# Patient Record
Sex: Male | Born: 1935
Health system: Southern US, Community
[De-identification: ages and names within clinical notes are randomized; demographics above are authoritative.]

## PROBLEM LIST (undated history)

## (undated) DIAGNOSIS — J449 Chronic obstructive pulmonary disease, unspecified: Secondary | ICD-10-CM

## (undated) DIAGNOSIS — B029 Zoster without complications: Secondary | ICD-10-CM

## (undated) DIAGNOSIS — J189 Pneumonia, unspecified organism: Secondary | ICD-10-CM

## (undated) DIAGNOSIS — D352 Benign neoplasm of pituitary gland: Secondary | ICD-10-CM

## (undated) DIAGNOSIS — M1990 Unspecified osteoarthritis, unspecified site: Secondary | ICD-10-CM

## (undated) HISTORY — DX: Zoster without complications: B02.9

## (undated) HISTORY — DX: Unspecified osteoarthritis, unspecified site: M19.90

## (undated) HISTORY — PX: TRANSPHENOIDAL / TRANSNASAL HYPOPHYSECTOMY / RESECTION PITUITARY TUMOR: SUR1382

---

## 1997-08-03 ENCOUNTER — Ambulatory Visit (HOSPITAL_COMMUNITY): Admission: RE | Admit: 1997-08-03 | Discharge: 1997-08-03 | Payer: Self-pay | Admitting: Internal Medicine

## 2000-10-10 ENCOUNTER — Ambulatory Visit (HOSPITAL_BASED_OUTPATIENT_CLINIC_OR_DEPARTMENT_OTHER): Admission: RE | Admit: 2000-10-10 | Discharge: 2000-10-10 | Payer: Self-pay | Admitting: Dentistry

## 2003-04-21 ENCOUNTER — Ambulatory Visit (HOSPITAL_COMMUNITY): Admission: RE | Admit: 2003-04-21 | Discharge: 2003-04-21 | Payer: Self-pay | Admitting: Internal Medicine

## 2006-08-24 ENCOUNTER — Inpatient Hospital Stay (HOSPITAL_COMMUNITY): Admission: EM | Admit: 2006-08-24 | Discharge: 2006-08-28 | Payer: Self-pay | Admitting: Emergency Medicine

## 2006-08-24 ENCOUNTER — Ambulatory Visit: Payer: Self-pay | Admitting: Internal Medicine

## 2006-08-25 ENCOUNTER — Encounter (INDEPENDENT_AMBULATORY_CARE_PROVIDER_SITE_OTHER): Payer: Self-pay | Admitting: Internal Medicine

## 2006-08-28 ENCOUNTER — Ambulatory Visit: Payer: Self-pay | Admitting: Internal Medicine

## 2006-09-18 ENCOUNTER — Encounter (INDEPENDENT_AMBULATORY_CARE_PROVIDER_SITE_OTHER): Payer: Self-pay | Admitting: Surgery

## 2006-09-18 ENCOUNTER — Ambulatory Visit (HOSPITAL_COMMUNITY): Admission: RE | Admit: 2006-09-18 | Discharge: 2006-09-19 | Payer: Self-pay | Admitting: Surgery

## 2007-02-11 ENCOUNTER — Ambulatory Visit (HOSPITAL_BASED_OUTPATIENT_CLINIC_OR_DEPARTMENT_OTHER): Admission: RE | Admit: 2007-02-11 | Discharge: 2007-02-12 | Payer: Self-pay | Admitting: Orthopedic Surgery

## 2007-12-29 ENCOUNTER — Ambulatory Visit (HOSPITAL_COMMUNITY): Admission: RE | Admit: 2007-12-29 | Discharge: 2007-12-29 | Payer: Self-pay | Admitting: Urology

## 2008-01-19 ENCOUNTER — Inpatient Hospital Stay (HOSPITAL_COMMUNITY): Admission: RE | Admit: 2008-01-19 | Discharge: 2008-01-21 | Payer: Self-pay | Admitting: Urology

## 2008-01-19 ENCOUNTER — Encounter (INDEPENDENT_AMBULATORY_CARE_PROVIDER_SITE_OTHER): Payer: Self-pay | Admitting: Urology

## 2008-03-05 HISTORY — PX: CHOLECYSTECTOMY OPEN: SUR202

## 2008-07-26 ENCOUNTER — Ambulatory Visit (HOSPITAL_COMMUNITY): Admission: RE | Admit: 2008-07-26 | Discharge: 2008-07-26 | Payer: Self-pay | Admitting: Urology

## 2008-07-28 ENCOUNTER — Ambulatory Visit (HOSPITAL_COMMUNITY): Admission: RE | Admit: 2008-07-28 | Discharge: 2008-07-28 | Payer: Self-pay | Admitting: Urology

## 2009-01-17 ENCOUNTER — Ambulatory Visit (HOSPITAL_COMMUNITY): Admission: RE | Admit: 2009-01-17 | Discharge: 2009-01-17 | Payer: Self-pay | Admitting: Urology

## 2009-03-05 HISTORY — PX: KIDNEY SURGERY: SHX687

## 2009-07-19 ENCOUNTER — Ambulatory Visit (HOSPITAL_COMMUNITY): Admission: RE | Admit: 2009-07-19 | Discharge: 2009-07-19 | Payer: Self-pay | Admitting: Urology

## 2010-02-06 ENCOUNTER — Ambulatory Visit (HOSPITAL_COMMUNITY)
Admission: RE | Admit: 2010-02-06 | Discharge: 2010-02-06 | Payer: Self-pay | Source: Home / Self Care | Admitting: Urology

## 2010-03-05 HISTORY — PX: RENAL BIOPSY: SHX156

## 2010-03-21 ENCOUNTER — Encounter
Admission: RE | Admit: 2010-03-21 | Discharge: 2010-03-21 | Payer: Self-pay | Source: Home / Self Care | Attending: Surgery | Admitting: Surgery

## 2010-03-21 ENCOUNTER — Other Ambulatory Visit
Admission: RE | Admit: 2010-03-21 | Discharge: 2010-03-21 | Payer: Self-pay | Source: Home / Self Care | Admitting: Interventional Radiology

## 2010-03-22 ENCOUNTER — Other Ambulatory Visit: Payer: Self-pay | Admitting: Interventional Radiology

## 2010-03-25 ENCOUNTER — Other Ambulatory Visit: Payer: Self-pay | Admitting: Surgery

## 2010-03-25 DIAGNOSIS — E041 Nontoxic single thyroid nodule: Secondary | ICD-10-CM

## 2010-07-18 NOTE — Op Note (Signed)
NAME:  Ryan Coffey, Ryan Coffey               ACCOUNT NO.:  [id number]   MEDICAL RECORD NO.:  [id number]          PATIENT TYPE:  OIB   LOCATION:  1340                         FACILITY:  [facility]   PHYSICIAN:  Thornton Park. Daphine Deutscher, MD  DATE OF BIRTH:  1932-12-09   DATE OF PROCEDURE:  09/18/2006  DATE OF DISCHARGE:                               OPERATIVE REPORT   PREOPERATIVE DIAGNOSIS:  History of biliary pancreatitis with gallstone  sludge.   POSTOPERATIVE DIAGNOSIS:  History of biliary pancreatitis with gallstone  sludge.   PROCEDURE:  Laparoscopic cholecystectomy with intraoperative  cholangiogram.   SURGEON:  Luretha Murphy, M.D.   ASSISTANT:  Adolph Pollack, M.D.   ANESTHESIA:  General endotracheal.   SPECIMEN:  Gallbladder.   DESCRIPTION OF PROCEDURE:  [name] is a 77 year old white male who  was taken to room 1 on Wednesday afternoon, September 18, 2006 and given  general anesthesia.  The abdomen was prepped with technique and draped  sterilely.  A longitudinal incision was made down into the umbilicus and  the Hasson cannula was inserted without difficulty.  Abdomen is  insufflated.  Three trocars were placed in the upper abdomen.  The  gallbladder was grasped, elevated and retracted.  Calot's triangle was  dissected and a critical view obtained.  The cystic duct was clipped,  incised and a Reddick catheter inserted and a dynamic cholangiogram was  shot and showed to me a slightly dilated common bile duct that was read  per radiology as normal.  Free flow into the duodenum.  No sludge or  stones were seen.  The cystic duct was then triple clipped divided.  Cystic artery was triple clipped divided, and the gallbladder was  removed the gallbladder bed with hook electrocautery without difficulty.  It was detached and placed in a bag and brought out through the  umbilicus.   The umbilical defect was repaired with under laparoscopic vision with  two sutures of 0-0 Vicryl simply.  The  gallbladder bed was inspected.  No bleeding was noted, no bile leaks were seen.  The lower portion of  the abdomen was examined.  No evidence of hernias and bowel appeared to  be otherwise normal.  He did have some diverticulosis that we could see  in the sigmoid region.  It was pretty much a normal finding for  gentleman this age.  Abdomen was then deflated and the port sites were closed with 4-0 Vicryl  after being injected with Marcaine.  The patient tolerated procedure  well was taken to recovery room in satisfactory condition.  He will be  kept for overnight observation.      Thornton Park Daphine Deutscher, MD  Electronically Signed     MBM/MEDQ  D:  09/18/2006  T:  09/19/2006  Job:  [id number]   cc:   Larina Earthly, M.D.  Fax: [phone]

## 2010-07-18 NOTE — Consult Note (Signed)
Ryan Coffey, Ryan Coffey               ACCOUNT NO.:  [id number]   MEDICAL RECORD NO.:  [id number]          PATIENT TYPE:  INP   LOCATION:  4710                         FACILITY:  [facility]   PHYSICIAN:  Bevelyn Buckles. Bensimhon, MDDATE OF BIRTH:  [birth year]   DATE OF CONSULTATION:  08/25/2006  DATE OF DISCHARGE:                                 CONSULTATION   REFERRING PHYSICIAN:  Tera Mater. Evlyn Kanner, M.D.   PRIMARY CARE PHYSICIAN:  Larina Earthly, M.D.   CARDIOLOGIST:  None.   REASON FOR CONSULTATION:  High-grade heart block with asymptomatic 5 to  6 second pauses.   HISTORY OF PRESENT ILLNESS:  Mr. 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  He denies any history of known coronary artery disease.  According to the chart, he does have a history of peripheral vascular  disease.  He has never had a stress test or catheterization to his  knowledge.  He was admitted yesterday with a gallstone pancreatitis.  Overnight he had multiple episodes of retching.  During these episodes  on telemetry, he had two 5 to 6 second pauses due to complete heart  block.  These were asymptomatic.  Last pause was about 2 a.m.  Early  this morning, around 4 a.m., he had some brief episodes of second degree  heart block.  His telemetry has been normal since that time.  He feels  more comfortable.   At baseline he is very active.  He swims and does other activities  without any chest pain or shortness of breath.  He has never had syncope  or presyncope.  Denies any heart failure.  At baseline his EKG shows a  narrow QRS.   REVIEW OF SYSTEMS:  Notable for abdominal pain, nausea, vomiting, and  fevers.  The remainder of the review of systems is negative except for  HPI and problem list.   PROBLEM LIST:  1. History of pituitary tumor, status post resection in 1988 with  resulting panhypopituitarism including hypothyroidism.  2. Rheumatoid arthritis.  3. Hyperlipidemia.  4. History of tobacco use, quit one year ago.   MEDICATIONS IN THE HOSPITAL:  1. Protonix 40 mg IV.  2. Solu-Cortef 100 mg q.8h.  3. Synthroid 88 mcg.  4. Lovenox 40 mg subcu.  5. Unasyn 1.5 g q.8h.   ALLERGIES:  No known drug allergies.   SOCIAL HISTORY:  Lives in [place] with his wife.  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  History of tobacco use of one pack per  day x20 years.  Quit a year ago.  No significant alcohol intake.   FAMILY HISTORY:  Mother died in her 36s.  No history of CAD.  Father  died at 43 due to emphysema.  He has three brothers with no history of  premature coronary artery disease.   PHYSICAL EXAMINATION:  GENERAL:  He is fairly well-appearing.  In no  acute distress, lying flat in bed.  VITAL SIGNS:  Blood pressure 107/68, heart rate  85, temperature 98.8.  HEENT:  Normal.  In particular, sclerae anicteric.  NECK:  Supple.  JVP is about 6 or 7 cm of water.  Carotids are 2+  bilaterally without any bruits.  There is no lymphadenopathy or  thyromegaly.  CARDIAC:  He has a regular rate and rhythm with an S4.  No murmurs.  LUNGS:  Clear.  ABDOMEN:  Soft, mildly tender in the epigastrium with no rebound or  guarding.  Hypoactive bowel sounds.  EXTREMITIES:  Warm with no cyanosis, clubbing or edema.  DP  pulses are  2+ bilaterally.  There is no rash.  NEUROLOGIC:  Alert and oriented x3.  Cranial nerves II-XII intact. Moves  all four extremities without difficulty.   EKG shows normal sinus rhythm with minimal T wave flattening and a rate  of 82.  P-R interval is 184 Msec.  QRS is narrow at 76 Msec.   LABORATORY DATA:  White count 28.7, hemoglobin 13.8, platelets 384, 000.  Sodium 140, potassium 5, BUN 22, creatinine 1.16, amylase 1478, lipase  985.   ASSESSMENT/PLAN:  1. Transient complete heart block in the setting of high vagal tone       and nausea and vomiting due to severe gallstone pancreatitis.  He      has not had a significant episode of heart block since 2 a.m.  At      this point I would prefer to watch him and if at all possible, will      transfer him to step-down with Atropine and Zoll's at the bedside.      Will check a TSH, 2-D echo and his electrolytes.  If pauses become      more frequent, he will need a transvenous pacer.  We will have a      low threshold to perform this.  We will follow him closely.      Bevelyn Buckles. Bensimhon, MD  Electronically Signed     DRB/MEDQ  D:  08/25/2006  T:  08/25/2006  Job:  [id number]

## 2010-07-18 NOTE — Consult Note (Signed)
NAMEOSMIN, WELZ               ACCOUNT NO.:  [id number]   MEDICAL RECORD NO.:  [id number]          PATIENT TYPE:  INP   LOCATION:  4710                         FACILITY:  [facility]   PHYSICIAN:  Adolph Pollack, M.D.DATE OF BIRTH:  Aug 03, 1932   DATE OF CONSULTATION:  DATE OF DISCHARGE:                                 CONSULTATION   REASON FOR CONSULTATION:  Biliary pancreatitis.   HISTORY OF PRESENT ILLNESS:  Mr. Coate is a 77 year old male with some  mild abdominal pain yesterday afternoon, was able to go to sleep but  awoke at five with a little bit of epigastric discomfort.  He than later  this morning had abrupt severe increasing of the pain in the epigastric  region with nausea, no radiation, had some chills, no fever and  presented to the emergency room where he was evaluated and found to have  findings consistent with acute pancreatitis.  Ultrasound was remarkable  for some gallbladder wall thickening with sludge and a common bile duct  diameter toward the upper limits of normal.  He is better with respect  to the pain since IV pain medication.   PAST MEDICAL HISTORY:  1. Pituitary tumor  2. Rheumatoid arthritis  3. Hyperlipidemia.  4. Bleeding peptic ulcer secondary to nonsteroidal anti-inflammatory      agents.   CURRENT OPERATIONS:  Transsphenoidal resection of pituitary tumor   ALLERGIES:  None.   MEDICATIONS:  Methotrexate, Pravachol, bromocriptine, hydrocortisone,  folic acid, fish oil, vitamins.   SOCIAL HISTORY:  He is married.  He is a former smoker.  No alcohol use.   FAMILY HISTORY:  Is noncontributory.   REVIEW OF SYSTEMS:  CARDIAC:  He denies hypertension or heart disease.  PULMONARY:  He denies asthma, pneumonia, COPD.  GI:  He denies  hepatitis, diverticulitis.  GU:  Denies kidney stones.  ENDOCRINE denies  diabetes.  HEMATOLOGIC thinks he may have had a transfusion with his  pituitary tumor resection, denied blood clots.  NEUROLOGIC:  No  strokes  or seizures.   PHYSICAL EXAM:  GENERALLY:  Well-developed, well-nourished male appears  to be mildly ill.  Temperature is 97.2, blood pressure 105/68, pulse 79.  EYES:  Extraocular motion is intact. He appears to have some mild  icterus.  NECK is supple without masses.  RESPIRATORY:  Breath sounds equal and clear, respirations unlabored.  CARDIOVASCULAR:  Regular rate, regular rhythm.  No murmur.  ABDOMEN:  Soft with some mild epigastric tenderness.  No masses  palpable.  Few bowel sounds heard.  No organomegaly.  MUSCULOSKELETAL:  No edema.  SKIN:  Slightly jaundiced.  NEUROLOGIC:  Alert, oriented, answers questions appropriately.   LABORATORY DATA:  Notable for lipase of 2517.  AST 249, ALT 188, total  bilirubin 4.5, alk phos 71.  White count 17,700.  CT pending.   IMPRESSION:  Acute pancreatitis, most likely appears to be biliary in  origin.  Also may have a possibility of cholecystitis as well.  Question  whether he has an impacted common bile duct stone or sludge.   PLAN:  Agree with IV fluids and IV  antibiotics and hydration.  If his  liver functions continue to rise he may need an ERCP and also pending  the CT results.  Eventually would benefit from laparoscopic, or possibly  open cholecystectomy.  I did discuss this procedure and risks with him.  Risks including but are not limited to  bleeding, infection, wound  healing problems, anesthesia, axial damage to the common  bile/liver/intestine, bile leak, and post cholecystectomy diarrhea.  He  seen understand and agree with the plan of action.      Adolph Pollack, M.D.  Electronically Signed     TJR/MEDQ  D:  08/24/2006  T:  08/25/2006  Job:  [id number]

## 2010-07-18 NOTE — Op Note (Signed)
Ryan Coffey, Ryan Coffey               ACCOUNT NO.:  [id number]   MEDICAL RECORD NO.:  [id number]          PATIENT TYPE:  INP   LOCATION:  1426                         FACILITY:  [facility]   PHYSICIAN:  Heloise Purpura, MD      DATE OF BIRTH:  13-Jul-1932   DATE OF PROCEDURE:  01/19/2008  DATE OF DISCHARGE:                               OPERATIVE REPORT   PREOPERATIVE DIAGNOSIS:  Left renal mass.   POSTOPERATIVE DIAGNOSIS:  Left renal mass.   PROCEDURE:  Left robotic laparoscopic partial nephrectomy.   ASSISTANT:  Delia Chimes, nurse practitioner.   ANESTHESIA:  General.   COMPLICATIONS:  None.   ESTIMATED BLOOD LOSS:  75 mL.   FLUIDS:  Intravenous fluids 2150 mL of lactated Ringer's.   SPECIMENS:  1. Left renal mass.  2. Left renal mass margin.   INTRAOPERATIVE FINDINGS:  Renal mass margin was negative for tumor.   Warm ischemia time 20 minutes.   DISPOSITION:  Specimens to pathology.   DRAINS:  1. #15 Blake perinephric drain.  2. 16-French Foley catheter.   INDICATION:  Mr. Jafri is a 77 year old gentleman who was found to have  an incidentally detected enhancing left renal mass measuring 1.7 cm.  This mass was felt to be concerning for renal malignancy.  He underwent  metastatic evaluation which was negative and was counseled regarding  management options.  He elected to pursue surgical resection and a  minimally invasive nephron sparing approach.  Potential risks,  complications, alternative treatment options associated with the above  procedures were discussed in detail and informed consent was obtained.   DESCRIPTION OF PROCEDURE:  The patient was taken to the operating room  and a general anesthetic was administered.  He was given preoperative  antibiotics, placed in the left modified flank position, and prepped and  draped in the usual sterile fashion.  He had been administered  preoperative stress dose steroids. Next, a preoperative time-out was  performed.  A  site was then selected lateral to the umbilicus on the  left side of the abdomen for placement of the camera port.  This was  placed using a standard open Hassan technique which allowed entry into  the peritoneal cavity under direct vision without difficulty.  A 12-mm  port was then placed and  a pneumoperitoneum established.  The 0-degree  lens was used to inspect the abdomen and there was no evidence for any  intra-abdominal injuries or other abnormalities.  The remaining ports  were then placed.  Then 8-mm  robotic ports were placed in the left  upper quadrant, left lower quadrant and far left lower quadrant.  A 12-  mm  port was placed in the midline just superior to the umbilicus.  All  ports were placed under direct vision and without difficulty.  The  surgical cart was then docked.   With the aid of the cautery scissors, the white line of Toldt was  incised along the length of the descending colon allowing the colon to  be mobilized medially in the space between the mesocolon and the  anterior layer of  Gerota's fascia to be developed.  Once the colon was  reflected adequately, the ureter and gonadal vein were identified and  these structures were lifted anteriorly off the psoas muscle.  Dissection then proceeded superiorly toward the main renal hilum.  The  patient was noted to have a large lumbar renal vein which was carefully  avoided.  The renal vein was identified and isolated from the  surrounding periadventitial tissue.  Just posterior to the renal vein  was a solitary renal artery that was identified and isolated.  This was  consistent with the patient's preoperative imaging.   Attention then returned to the kidney itself.  The lateral and  posterior,  as well as the superior attachments were carefully taken  down with a combination of cautery and sharp dissection.  Once the  kidney was freely mobile, Gerota's fascia was entered and the renal  capsule identified.  The  renal capsule was then cleared of the overlying  perinephric fat and the patient's concerning renal mass lesion was  easily identified.  It did appear to be a solid mass consistent with the  patient's preoperative imaging.  Then 12.5 grams of intravenous mannitol  was administered.  Mobilization of the kidney continued allowing the  renal mass to be easily isolated in preparation for resection.  Once  mannitol had been in approximately 10-15 minutes, the renal artery was  clamped with a laparoscopic bulldog clamp.  The mass was then resected  with cold sharp scissor dissection.  Additional frozen section margins  from the base of the resection site were sent for intraoperative  analysis and returned negative for tumor.   Attention then turned to reconstruction of the renal defect.  A piece of  Surgicel was placed into the defect as a bolster and 2-0 Monocryl  sutures were then placed through the renal capsule and used to secure  the bolster in place with a combination of Hem-o-lok than Lapra ties  to  provide renal compression.  Prior to compressing the bolster down,  FloSeal was placed into the defect.  The clamp was then removed.  Total  warm ischemia was 20 minutes.  The defect was then reinspected and there  was no evidence of any bleeding.  Additional FloSeal was placed over the  defect and the kidney was placed back into its normal position.  An  additional 12.5 grams of intravenous mannitol was administered.   The overlying perinephric fat was then reapproximated over the renal  defect and a #15 Blake drain was brought through the far left lower  quadrant port site and appropriately positioned in the perinephric  space.  It was secured to skin with a nylon suture.  The surgical cart  was then undocked.  The renal tumor itself was had been placed into an  Endopouch retrieval bag and was removed via the camera port incision.  All remaining ports were removed under direct vision  after the 12-mm  midline port was closed with a 0 Vicryl  suture placed via the aid of  suture passer device.  The camera port site was then closed with a  running 0 Vicryl suture.  The pneumoperitoneum had been  previously expelled.  All port sites were then injected with quarter  percent Marcaine  and reapproximated at the skin level with staples.  Sterile dressings were applied.  The patient appeared to tolerate the  procedure well without complications.  He was able to be extubated and  transferred to the recovery  unit in satisfactory condition.      Heloise Purpura, MD  Electronically Signed     LB/MEDQ  D:  01/19/2008  T:  01/20/2008  Job:  [id number]

## 2010-07-18 NOTE — Op Note (Signed)
NAME:  Ryan Coffey, Ryan Coffey               ACCOUNT NO.:  [id number]   MEDICAL RECORD NO.:  [id number]          PATIENT TYPE:  AMB   LOCATION:  DAY                          FACILITY:  [facility]   PHYSICIAN:  Mark C. Vernie Ammons, M.D.  DATE OF BIRTH:  [birth year]   DATE OF PROCEDURE:  12/29/2007  DATE OF DISCHARGE:                               OPERATIVE REPORT   PREOPERATIVE DIAGNOSIS:  Left ureteral calculus.   POSTOPERATIVE DIAGNOSIS:  Left ureteral calculus.   OPERATION PERFORMED:  1. Cystoscopy.  2. Left ureteral catheterization.  3. Left retrograde pyelogram with interpretation.  4. Left ureteroscopy.  5. Laser lithotripsy.  6. Stone extraction.  7. Left double-J stent placement (6 French, 24 cm) with string.   SURGEON:  Mark C. Vernie Ammons, M.D.   ANESTHESIA:  General.   SPECIMENS:  Stone to patient.   ESTIMATED BLOOD LOSS:  Minimal.   DRAINS:  As noted above.   COMPLICATIONS:  None.   INDICATIONS FOR PROCEDURE:  The patient is a 77 year old white male who  had a left ureteral stone diagnosed that initially progressed down the  ureter but then held up and showed no further progression.  He is  brought to the operating room today for surgical treatment of that and I  have discussed the risks, complications and alternatives.  He  understands this and has elected to proceed.   DESCRIPTION OF PROCEDURE:  After informed consent, the patient was  brought to the major operating room, placed on the table and  administered general anesthesia, then moved to the dorsal lithotomy  position.  His genitalia were sterilely prepped and draped and official  time out was then performed.   Initially, a 22 French cystoscope with a 12 degree lens was inserted  under direct visualization.  The urethra was noted to be normal, the  sphincter intact and the prostatic urethra revealed no significant  obstruction.  Upon entering the bladder, I noted no tumors, stones or  inflammatory lesions on complete  inspection.  The ureteral orifices were  noted to be normal in configuration and position.   A 6 French open ended ureteral catheter was then passed through the  cystoscope and engaged in the left ureteral orifice.   Full strength contrast was then injected through the open ended catheter  under direct fluoroscopy which revealed normal-appearing proximal  ureter, up to a filling defect over the sacrum consistent with a stone  that had previously been visualized on KUB.  No abnormality of the  ureter proximal to that or the collecting system was identified.  Through the open ended catheter a 0.038 inch floppy tip guidewire was  then passed into the area of renal pelvis under fluoroscopy and the  cystoscope as well as open ended catheter were removed with the  guidewire left in place.   The 6 French rigid short ureteroscope was then passed under direct  visualization through the urethra and into the ureteral orifice without  difficulty.  I was able to pass this on up to visualize the stone.   The 365 micron holmium laser fiber was then used  to fragment the stone  into several small fragments.  I then inserted a nitinol basket and  engaged each of the fragments and removed them separately.  When I  passed the ureteroscope a final time, I noted no further fragments  present and no evidence of ureteral injury.  The ureteroscope was  therefore removed.   The cystoscope was backloaded over the guidewire and the double-J stent  passed observed over the guidewire using direct fluoroscopy.  As the  guidewire was removed, good curl was noted in the renal pelvis and in  the bladder.  The bladder was then drained, the cystoscope removed and  2% lidocaine jelly was instilled in the urethra as well as a penile  clamp applied.  The patient was awakened and taken to the recovery room  in stable satisfactory condition.  He tolerated the procedure well.  There were no intraoperative complications.  He  will be given a  prescription for Tylox #36 and Vesicare 5 mg daily #7.  He will follow  up in my office in 5 days and will have his stent removed with the use  of the attached tether.      Mark C. Vernie Ammons, M.D.  Electronically Signed     MCO/MEDQ  D:  12/29/2007  T:  12/29/2007  Job:  [id number]

## 2010-07-18 NOTE — Op Note (Signed)
Ryan Coffey, Ryan Coffey               ACCOUNT NO.:  [id number]   MEDICAL RECORD NO.:  [id number]          PATIENT TYPE:  AMB   LOCATION:  DSC                          FACILITY:  [facility]   PHYSICIAN:  Leonides Grills, M.D.     DATE OF BIRTH:  1933-02-02   DATE OF PROCEDURE:  02/11/2007  DATE OF DISCHARGE:                               OPERATIVE REPORT   PREOPERATIVE DIAGNOSES:  1. Right great toe interphalangeal joint arthritis secondary to      rheumatoid arthritis.  2. Right second through fourth metatarsalgia.  3. Right fifth metatarsalgia.  4. Right second through fifth hammer toes.  5. Right tight gastrocnemius.   POSTOPERATIVE DIAGNOSES:  1. Right great toe interphalangeal joint arthritis secondary to      rheumatoid arthritis.  2. Right second through fourth metatarsalgia.  3. Right fifth metatarsalgia.  4. Right second through fifth hammer toes.  5. Right tight gastrocnemius.   OPERATION PERFORMED:  1. Right great toe metatarsophalangeal joint fusion.  2. Right local bone graft.  3. Right second through fourth metatarsal head resection.  4. Right fifth metatarsal head resection.  5. Right second through fifth toes metatarsophalangeal joint dorsal      capsulotomies with collateral ligament release.  6. Right second through fifth toes proximal phalanx head resections.  7. Right second through fourth toes extensor digitorum brevis to      extensor digitorum longus tendon transfers.  8. Right fifth toe extensor digitorum longus Z-lengthening.  9. Right gastroc slide.   ANESTHESIA:  General.   SURGEON:  Leonides Grills, M.D.   ASSISTANT:  Evlyn Kanner, P.A.   ESTIMATED BLOOD LOSS:  Minimal.   TOURNIQUET TIME:  Approximately 1 hour and 45 minutes.   COMPLICATIONS:  None.   DISPOSITION:  Stable to PR.   INDICATIONS FOR PROCEDURE:  The patient is a 77 year old male who has  had longstanding right forefoot pain with severe deformity and arthritis  that is interfering  with his life to the point that he can not do what  he wants to do despite conservative management.  He was consented for  the above procedure.  All risks which include infection, neurovascular  injury, nonunion, malunion, hardware irritation, hardware failure,  persistent pain, worsening pain, recurrence of deformity, progression of  the rheumatoid arthritic forefoot deformity were all explained,  questions were encouraged and answered.   DESCRIPTION OF PROCEDURE:  The patient was brought to the operating room  and placed in supine position and after adequate general endotracheal  tube anesthesia was administered with block as well as Ancef 1 g IV  piggyback.  The right lower extremity was prepped and draped in sterile  manner over a proximally placed thigh tourniquet.  Once the right lower  extremity was prepped and draped in the sterile manner, we started the  procedure with a longitudinal incision Olympus video endoscope the  medial aspect of the gastrocnemius musculotendinous junction.  Dissection was carried down through the skin.  Hemostasis was obtained.  Fascia was opened in line with the incision.  Conjoined region was then  developed between the  gastrocnemius and soleus.  Soft tissue was then  elevated on the posterior aspect of the gastrocnemius.  Sural nerve was  identified and visually seen and protected throughout the case.  Gastrocnemius was then released with the curved Mayo scissors protecting  the sural nerve and this had excellent release of tight gastroc.  The  area was copiously irrigated with normal saline.  Subcu was closed with  3-0 Vicryl.  Skin was closed with 4-0 Monocryl subcuticular stitch.  Steri-Strips were applied.  The limb was then gravity exsanguinated and  tourniquet was elevated 290 mmHg.  A longitudinal incision over the  medial aspect of midline of the great toe MTP joint was then made.  Dissection was carried down through skin.  Hemostasis was  obtained.  Neurovascular structures were identified both superiorly and inferiorly  and protected throughout the case.  Capsulotomy was then made.  Dissection of the head and the base of the proximal phalanx was then  performed.  The entire medial base of the proximal phalanx was  completely eroded but the lateral base was preserved.  We then removed  the remaining cartilage which was very minimal and placed multiple 2 mm  holes on either side of the joint after rounding off the metatarsal head  to accommodate the curvature of the remaining base of proximal phalanx.  We then reduced the MTP joint and making this approximately 5 degrees of  valgus and proximal phalanx parallel to the plantar aspect of the  weightbearing foot.  This was then provisionally fixed with 1.6 mm K-  wire and verified under C-arm guidance in the AP and lateral planes to  be in excellent position.  We then placed two 3.5 mm fully threaded  cortical lag screws using 3.5 and 2.5 drill holes respectively.  This  had excellent purchase and maintenance of the correction.  K-wire was  removed.  We did not use a criss-cross technique due to the simple fact  there was no bone at the medial base of the proximal phalanx.  We then  from the local bone graft that was obtained from resection of the spurs  as well as from the drill bits as well as later with the heads of the  metatarsals, we placed them on the back table and applied this to the  fusion sites and packed this in as local bone graft.  Final stress x-  rays were obtained in the AP and lateral planes and showed no gross  motion across the fusion site, fixation in proper position and excellent  alignment as well.  Capsule was closed with 3-0 Vicryl.  We then made a  longitudinal incision over the second toe, dorsally.  Dissection was  carried down through skin.  Hemostasis was obtained.  Extensor digitorum  brevis and extensor digitorum longus tendons were identified   respectively and the EDL tendon was tenotomized proximal and medial and  the brevis distal lateral and retracted out of harm's way for later  transfer.  We then skeletonized the distal aspect of the proximal  phalanx.  The head was then removed with a rongeur followed by a bone  cutter.  This cut was made perpendicular to the long axis of the  proximal phalanx.  We then back to the MTP joint and a formal dorsal  capsulotomy and careful collateral release was then performed.  We then  removed the metatarsal head, exposed this carefully protecting the soft  tissues medial and laterally and with the sagittal  saw resected the head  with a dorsal lateral, plantar medial angle and resected the head and  carefully dissected this out and placed this on the back table as local  bone graft.  This exact procedure was done for the third, fourth and  fifth toes respectively except for the fifth toe, the EDL was Z-  lengthened.  We then went back to the second toe and placed a 0.062 K-  wire antegrade through middle and distal phalanx with the DIP  joint  held in reduced position, reduced the PIP joint as well as the MTP joint  and fired the K-wire retrograde across the MTP joint.  This was done for  the second, third, fourth and fifth toes.  We then completed the EDB to  EDL tendon transfer using 3-0 PDS stitch for the second, third and  fourth toes.  For the fifth toe, we completed the EDL Z-lengthening by  repairing the tendon with 3-0 PDS stitch.  This had an Interior and spatial designer.  Tourniquet was deflated and hemostasis was obtained.  Toes  pinked up nicely.  Skin relieving incisions were made on either side of  the K-wires.  K-wires were bent, cut and capped.  Subcu was closed with  3-0 Vicryl.  Skin was with 4-0 nylon over all wounds.  Sterile dressing  was applied.  Modified Jones dressing was applied.  The patient was  stable to the PR.      Leonides Grills, M.D.  Electronically Signed      PB/MEDQ  D:  02/11/2007  T:  02/11/2007  Job:  [id number]
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
## 2010-07-18 NOTE — Discharge Summary (Signed)
NAMEBROOKLYN, JEFF               ACCOUNT NO.:  [id number]   MEDICAL RECORD NO.:  [id number]          PATIENT TYPE:  INP   LOCATION:  4742                         FACILITY:  [facility]   PHYSICIAN:  Larina Earthly, M.D.        DATE OF BIRTH:  February 24, 1933   DATE OF ADMISSION:  08/24/2006  DATE OF DISCHARGE:  08/28/2006                               DISCHARGE SUMMARY   DISCHARGE DIAGNOSES:  1. Severe ulcerative esophagitis with a history of a prior duodenal      ulcer requiring hospitalization and transfusion.  2. Biliary pancreatitis, now resolved in need of elective      cholecystectomy.  3. Heme-positive stools with scant bloody stool per rectum, in need of      colonoscopy in the future, hemodynamically stable.  4. Panhypopituitarism, status post prior pituitary resection, on      testosterone, cortisone, bromocriptine, and thyroid replacement.  5. Hypothyroidism, secondary to the above.  6. Mild right ventricular dilatation based on echocardiogram,      asymptomatic and possibly related to history of multi-year use of      tobacco products, having quit approximately 1 year ago.  7. Rheumatoid arthritis followed by Dr. Kellie Simmering, currently on      methotrexate and folic acid.  8. Small iliac aneurysm discovered on CT scan, in need of modification      and controllable vascular risk factors.  9. Hyperlipidemia, currently on statin consisting of Pravachol,      uncontrolled.  10.Transient heart block with significant sinus pauses during      hospitalization, asymptomatic and hemodynamically stable in the      setting of significant increases in vagal tone, associated with      nausea and vomiting followed by cardiology and not recurrent.  11.History of duodenal ulcer in the setting of nonsteroidal use.   DISCHARGE MEDICATIONS:  1. Synthroid 137 mcg each day.  2. Methotrexate 2.5 mg per Dr. Ines Bloomer instructions.  3. Folic acid 1 mg each day.  4. Cortisone 50 mg in the morning and 25 mg  in the evening times 1      week and then 37.5 mg in the morning and 18 mg in the evening times      1 week and then reducing to his baseline dose of 25 mg in the      morning and 12.5 mg in the evening.  5. Bromocriptine 2.5 mg p.o. b.i.d.  6. Depo-Testosterone 200 mg intramuscularly every 2 weeks.  7. Pravachol 20 mg each day.  8. Calcium and vitamin D supplementation twice daily.  9. Protonix 40 mg twice daily until further notice.  10.Nu-Iron 150 mg each day.  11.Fish oil per prior instructions.   The patient has been instructed to maintain a low-fat, high-fiber diet  with no alcohol use and to call [facility] if he has  increased amounts of blood per rectum or bloody stools or any evidence  of hemodynamic compromise associated with this bleeding.  The patient is  to return to see Avel Peace and to be seen in 2-3 weeks  to discuss  gallbladder surgery.  He is to see [facility] on 10/10/2006 at 9:00  a.m. to discuss endoscopy and possible colonoscopy.  He is scheduled to  see [facility] on 10/24/2006 for endoscopy.  He has also been instructed  to call Dr. Felipa Eth for an appointment in 10-14 days, at which time he will  need a BMP, CBC, thyroid function studies and blood pressure evaluation  as well as a review of his control for vascular risk factors.   LABORATORY DATA:  Discharge labs:  White blood cell 8.5, hemoglobin  12.4, hematocrit 38.3%, platelet count 369.  Sodium 139, potassium 3.8,  BUN 18, creatinine 0.8, glucose 116, serum CO2 24, AST 24, ALT 54, alk  phos 51, total bilirubin 1.3, albumin 2.2.  Amylase from 08/26/2005 was  97, lipase 59.  The patient does have one heme positive stool during  this hospitalization.  Other pertinent labs during this hospitalization  include an admission white blood cell count of 17.7 increasing to 28.7  in the setting of stress dose steroids and acute pancreatitis,  decreasing as mentioned above to normal levels prior to  discharge.  The  patient's hemoglobin on admission was 14.2, slowly decreasing with IV  fluids during hospitalization but with no acute drops.  Platelet count  remained stable.  Hematocrit was 43.7% on admission.  D-dimer was 3.72.  BMP on admission revealed a sodium of 141, potassium of 3.9, BUN 20,  creatinine 1.19.  On admission, albumin was 3.5, AST was 170, ALT was  108 with alkaline phosphatase of 63, total bilirubin of 3.4.  Liver  function tests increasing during hospitalization to a maximum AST of  248, ALT of 194, total bilirubin of 4.5 and an alkaline phosphatase that  remained normal.  Amylase was 1478 decreasing to normal levels prior to  discharge and lipase was 2517 decreasing to normal levels prior to  discharge.  Triglyceride level 63.  TSH was as expected 0.018 in the  setting of panhypopituitarism.  Free T4 was 0.85 and T3 was 55.2.  Urinalysis was moderately positive based on microscopic exam; however,  culture was unrevealing.   RADIOLOGICAL EVALUATION:  CT of the abdomen and pelvis on 08/24/2006  revealed inflammation consistent with a penetrating or perforating  duodenal ulcer, which was not seen on endoscopy.  However, an  alternative diagnosis that was posted was an acute pancreatitis.  Other  issues included a right common iliac aneurysm, measuring 2.4 cm with a  mural thrombus.  There are no findings of diverticulitis, but there was  a colonic diverticula.  Portable chest x-ray revealed mild atelectasis  at the bases, with a suspicion of chronic bronchitic interstitial lung  changes.  Ultrasound obtained on admission revealed a mildly distended  gallbladder containing echogenic sludge with no shadowing gallstones,  borderline to mild wall thickening and mildly positive sonographic  Murphy sign, possibly consistent with an acalculous cholecystitis,  borderline biliary ductal dilatation of the common duct measuring  approximately 8 mm and a normal-appearing  pancreas by ultrasound,  although sonographer noted extreme tenderness upon examination.  A  2-D  echocardiogram revealed overall LV function was normal with an ejection  fraction 55-60% with inadequate evaluation of the left ventricle for  regional wall motion.  Right ventricle was mildly dilated, and there was  an estimated peak pulmonary artery systolic pressure that was mildly  increased, right atrium was mildly dilated.   HISTORY OF PRESENT ILLNESS:  This is a 77 year old Caucasian male  with  the above-mentioned history who developed acute anorexia, epigastric and  abdominal pain, and looked quite pale to family members, essentially  with three episodes of nausea and vomiting.  He had no evidence of blood  in his emesis or in his bowel movements.  He is brought to the emergency  room where he was noted to have some pleuritic chest discomfort, but  presented with, as above, acute pancreatitis with significantly elevated  pancreatic and modestly elevated hepatic enzymes with 2 out of 5 Ransom  criteria.  Please see the history and physical dictated by Dr. Adrian Prince for details.   The patient was admitted, given bowel rest, significant IV fluids and IV  pain medications for the initial 48 hours, along with empiric  antibiotics.  Gastroenterology as well as General Surgery were consulted  and also recommended CT scanning with acute pancreatitis being most  likely biliary in origin with possible cholecystitis and questionable  impacted common bile duct stone.  They also recommended possible ERCP  and/or eventual cholecystectomy.  Gastroenterology concurred, and given  the possibility of a perforated gastric ulcer, given his history of  duodenal ulcer on CT scanning, endoscopy was performed on 08/27/2006,  which revealed severe ulcerations in the esophagus, approximately 15 cm  in length, possibly related to reflux, but no ulcers in the cardia to  the duodenum.  They recommended  b.i.d. proton pump inhibitors, but they  also agreed that the pancreatitis was resolving and suspected a small  gallstone and elective cholecystectomy to be determined by surgical  evaluation in the near future.  They also further recommended a repeat  endoscopy in 2 months to reassess his esophagitis after several weeks of  twice daily proton pump inhibitor to evaluate active inflammation that  may have masked Barrett changes.   The patient's hospital course was also complicated by some significant  sinus pauses during violent episodes of nausea and vomiting, thought to  be secondary to vagal tone per Cardiology.  Cardiology did evaluate the  patient with an echocardiogram and noted that he remained  hemodynamically stable with no evidence of orthostasis or hypotension  with the transient sinus pauses and also further noted that there was no  further recurrence and recommended outpatient monitoring.  The patient  continued to improve with resolving of pancreatic and liver enzymes by  the day of discharge.  Plans were made to taper his cortisone  administration and continue to advance his diet to a solid diet.  All  was discussed with the family at length, and the patient was deemed  appropriate for discharge and appropriate outpatient management by  multiple subspecialties as noted above on 08/28/2006 in good condition.      Larina Earthly, M.D.  Electronically Signed     RA/MEDQ  D:  08/28/2006  T:  08/29/2006  Job:  [id number]   cc:   [place] Daphine Deutscher, MD  Hedwig Morton. Juanda Chance, MD  Rachael Fee, MD  Georgianne Fick, M.D.

## 2010-07-21 NOTE — Discharge Summary (Signed)
NAMEULYSEES, Ryan Coffey               ACCOUNT NO.:  [id number]   MEDICAL RECORD NO.:  [id number]          PATIENT TYPE:  INP   LOCATION:  1426                         FACILITY:  [facility]   PHYSICIAN:  Heloise Purpura, MD      DATE OF BIRTH:  October 20, 1932   DATE OF ADMISSION:  01/19/2008  DATE OF DISCHARGE:  01/21/2008                               DISCHARGE SUMMARY   ADMISSION DIAGNOSIS:  Left renal mass.   DISCHARGE DIAGNOSIS:  Left renal mass.   PROCEDURE:  Left robotic-assisted laparoscopic partial nephrectomy.   HISTORY AND PHYSICAL:  For full details, please see admission history  and physical.  Briefly, Ryan Coffey is a 77 year old gentleman who was  found to have an incidentally detected, enhancing left renal mass  measuring 1.7 cm, which was felt to be concerning for renal malignancy.  He was counseled regarding management options.  He elected to pursue  surgical resection and a minimally invasive nephron-sparing approach.   HOSPITAL COURSE:  On January 19, 2008, he was taken to the operating  room and underwent a left robotic-assisted laparoscopic partial  nephrectomy which he tolerated well and without complications.  Preoperatively, he received a stress dose of steroids due to the fact  that he is in on chronic steroid replacement due to pituitary  insufficiency.  Postoperatively, he was able to be transferred to a  regular hospital room following recovery from anesthesia, where he was  monitored overnight and remained hemodynamically stable.  He was  maintained on a strict bedrest for 24 hours.  His steroid dose was  changed to IV administration and titrated down over the next 2  postoperative days.  His serum creatinine increased postoperatively to  1.01 and subsequently decreased on postoperative day #2 to 0.93.  He was  to begin a clear-liquid diet on postoperative day #1, which he tolerated  well and without difficulty.  He was able to be transitioned to a  regular diet  over the next 24-48 hours.  He began ambulating after  bedrest for 24 hours, which he did without difficulty.  He was  transitioned to oral pain medication.  He maintained excellent urine  output in the first 24 hours after surgery; therefore, his Foley  catheter was removed.  On postoperative day #2, he had minimal output  from his pelvic drain, and it was sent for a creatinine level and found  to be consistent with serum creatinine at 0.8 indicating no evidence for  a urine leak. His drain was removed.  He continued to tolerate his diet,  ambulate without difficulty, and his pain was well controlled.  He was  therefore able to be discharged home in excellent condition.   DISPOSITION:  Home.   DISCHARGE MEDICATIONS:  He was given a prescription for Vicodin to use  as needed for pain and told to use Colace as his stool softener.  He was  instructed to resume all home medications consisting of Pravastatin,  methotrexate, levothyroxine, Protonix, Depo Testosterone, bromocriptine.  He was instructed to hold multivitamins, herbal supplements, and aspirin  for 10 days.  He was  also instructed to begin oral hydrocortisone of 50  mg twice daily and titrate down to 25 mg twice daily and then on  postoperative day #5, resume his normal dose of home-prescribed  cortisone.   DISCHARGE INSTRUCTIONS:  He was instructed to be ambulatory but  specifically told to refrain from any heavy lifting during, strenuous  activity, or driving.  He was instructed to continue his regular diet.   FOLLOW UP:  He will return in 10-14 days for further postoperative  evaluation.      Ryan Chimes, NP      Heloise Purpura, MD  Electronically Signed    MA/MEDQ  D:  01/22/2008  T:  01/22/2008  Job:  [id number]

## 2010-07-21 NOTE — Op Note (Signed)
[employer] [facility]  Patient:    Ryan Coffey, Ryan Coffey                      MRN: [id number] Adm. Date:  [id number] Attending:  Vick Frees                           Operative Report  PREOPERATIVE DIAGNOSES: 1. Gross dental decay. 2. Periodontal abscess. 3. Profound dental phobia.  POSTOPERATIVE DIAGNOSES: 1. Gross dental decay. 2. Periodontal abscess. 3. Profound dental phobia.  PROCEDURES: 1. Extraction for tooth #1, 2, 17, 24, 25, and 26, 30, and 32. 2. Porcelain-fused metal crown and bridge work, tooth #3-14, tooth #18-31. 3. Four quadrants of periodontal scaling root planing.  SURGEON:  Mark E. Renard Hamper, D.D.S.  ANESTHESIA:  General via endotracheal intubation.  ESTIMATED BLOOD LOSS:  Less than 20 cc.  SPECIMENS:  There were no specimens, cultures, or drains.  DESCRIPTION OF PROCEDURE:  The patient was brought to [facility] day surgery center. Consent was obtained.  The case was prepped in a sterile fashion.  Betadine scrub of the mouth was performed.  Throat pack was placed.  Marcaine 0.5% with 1:200,000 epinephrine 14.8 cc was placed throughout the mouth, 1 g of Ancef given IV.  Attention was given to the maxilla, where tooth #3, 4, 5, 6, 7, 8, 9, 10, 11, 12, 13, 14 were prepared for porcelain-fused metal crowns. Attention was turned to the mandible, where tooth #18, 19, 20, 21, 22, 27, 28, 29, and 31 were prepared for porcelain-fused metal crown and bridge work. Vigorous Cavitroning was performed in all four quadrants, removing plaque, calculus, and stain.  Attention was turned to the upper right quadrant, where tooth #1 and 2 were elevated and forceps-extracted.  Sockets were curetted, 4-0 chromic gut suture placed.  Attention was turned to the lower left quad. Tooth #17 was elevated, forceps-delivered, socket was curetted, 4-0 chromic gut suture was placed.  Attention was turned to the lower right quad, where the root tip tooth #30 and tooth  #31 were elevated and extracted.  The clinical crown tooth #32 fractured off, and the tooth was removed with a series of elevators.  Both sockets were curetted, 4-0 chromic gut suture was placed.  Gelfoam was placed in the socket of tooth #17 and 32.  Attention was turned to the mandibular anterior quadrant.  Tooth #22, 23, 24, 25, and 26 were elevated and extracted with forceps, sockets were curetted, 4-0 chromic gut suture placed in a continuous mattress fashion.  The mouth was rinsed with copious amounts of sterile saline.  The mouth was re-cavitroned and the prefabricated acrylic provisional bridge work was cemented with [place]. Throat pack was removed.  The case was turned over to anesthesia. DD:  10/10/00 TD:  10/11/00 Job: [id number]

## 2010-08-10 ENCOUNTER — Encounter (INDEPENDENT_AMBULATORY_CARE_PROVIDER_SITE_OTHER): Payer: Self-pay | Admitting: Surgery

## 2010-09-20 ENCOUNTER — Ambulatory Visit
Admission: RE | Admit: 2010-09-20 | Discharge: 2010-09-20 | Disposition: A | Discharge status: Home / Self Care | Payer: Medicare Other | Source: Ambulatory Visit | Attending: Surgery | Admitting: Surgery

## 2010-09-20 DIAGNOSIS — E041 Nontoxic single thyroid nodule: Secondary | ICD-10-CM

## 2010-12-05 LAB — BASIC METABOLIC PANEL
CO2: 29
Calcium: 8.1 — ABNORMAL LOW
Calcium: 8.4
Calcium: 9.4
Chloride: 106
Creatinine, Ser: 0.93
Creatinine, Ser: 1.01
GFR calc non Af Amer: >60
GFR calc non Af Amer: >60
Glucose, Bld: 110 — ABNORMAL HIGH
Glucose, Bld: 137 — ABNORMAL HIGH
Glucose, Bld: 73
Potassium: 4.4
Sodium: 138

## 2010-12-05 LAB — CBC
MCHC: 31.7
MCV: 96.9
Platelets: 379
RDW: 21.7 — ABNORMAL HIGH
WBC: 9.6

## 2010-12-05 LAB — ABO/RH: ABO/RH(D): O POS

## 2010-12-05 LAB — GLUCOSE, CAPILLARY: Glucose-Capillary: 168 — ABNORMAL HIGH

## 2010-12-05 LAB — TYPE AND SCREEN: Antibody Screen: NEGATIVE

## 2010-12-05 LAB — HEMOGLOBIN AND HEMATOCRIT, BLOOD: HCT: 44.6

## 2010-12-05 LAB — PSA: PSA: 2.03

## 2010-12-11 LAB — POCT HEMOGLOBIN-HEMACUE: Hemoglobin: 14.2

## 2010-12-11 LAB — BASIC METABOLIC PANEL
CO2: 30
Calcium: 9.9
Chloride: 105
GFR calc Af Amer: >60
Sodium: 140

## 2010-12-18 LAB — COMPREHENSIVE METABOLIC PANEL
ALT: 37
AST: 34
Alkaline Phosphatase: 46
GFR calc Af Amer: >60
Glucose, Bld: 99
Potassium: 4.6
Sodium: 138
Total Protein: 5.1 — ABNORMAL LOW

## 2010-12-18 LAB — CBC
Hemoglobin: 12.7 — ABNORMAL LOW
RBC: 4.21 — ABNORMAL LOW
RDW: 20.7 — ABNORMAL HIGH

## 2010-12-18 LAB — AMYLASE: Amylase: 41

## 2010-12-19 LAB — DIFFERENTIAL
Eosinophils Relative: 3
Lymphocytes Relative: 16
Lymphs Abs: 1.5
Monocytes Absolute: 0.9 — ABNORMAL HIGH
Neutro Abs: 6.8

## 2010-12-19 LAB — COMPREHENSIVE METABOLIC PANEL
AST: 29
Albumin: 3.2 — ABNORMAL LOW
BUN: 11
Calcium: 9.3
Creatinine, Ser: 1
GFR calc Af Amer: >60
Total Protein: 5.8 — ABNORMAL LOW

## 2010-12-19 LAB — CBC
MCHC: 32.8
MCV: 92.4
Platelets: 372
RDW: 20.6 — ABNORMAL HIGH
WBC: 9.6

## 2010-12-19 LAB — APTT: aPTT: 24

## 2010-12-20 LAB — BLOOD GAS, ARTERIAL
O2 Content: 2
O2 Saturation: 82.8
pCO2 arterial: 38.4
pH, Arterial: 7.417
pO2, Arterial: 45.7 — ABNORMAL LOW

## 2010-12-20 LAB — COMPREHENSIVE METABOLIC PANEL
ALT: 106 — ABNORMAL HIGH
AST: 170 — ABNORMAL HIGH
AST: 24
AST: 67 — ABNORMAL HIGH
Albumin: 2.2 — ABNORMAL LOW
Albumin: 3.5
Alkaline Phosphatase: 51
Alkaline Phosphatase: 63
BUN: 20
BUN: 22
CO2: 22
CO2: 24
CO2: 26
Calcium: 8 — ABNORMAL LOW
Calcium: 8.1 — ABNORMAL LOW
Chloride: 108
Chloride: 110
Chloride: 111
Creatinine, Ser: 0.88
Creatinine, Ser: 1.19
GFR calc Af Amer: >60
GFR calc Af Amer: >60
GFR calc Af Amer: >60
GFR calc Af Amer: >60
GFR calc non Af Amer: >60
GFR calc non Af Amer: >60
GFR calc non Af Amer: >60
Glucose, Bld: 92
Potassium: 3.8
Potassium: 3.9
Potassium: 4.4
Sodium: 140
Total Bilirubin: 1.3 — ABNORMAL HIGH
Total Bilirubin: 1.5 — ABNORMAL HIGH
Total Bilirubin: 3.4 — ABNORMAL HIGH
Total Protein: 4.5 — ABNORMAL LOW

## 2010-12-20 LAB — URINALYSIS, ROUTINE W REFLEX MICROSCOPIC
Glucose, UA: NEGATIVE
Hgb urine dipstick: NEGATIVE
Ketones, ur: 15 — AB
Nitrite: POSITIVE — AB
Protein, ur: 30 — AB
Specific Gravity, Urine: 1.022
Urobilinogen, UA: 1
pH: 5.5

## 2010-12-20 LAB — HEPATIC FUNCTION PANEL
AST: 142 — ABNORMAL HIGH
AST: 248 — ABNORMAL HIGH
AST: 249 — ABNORMAL HIGH
Albumin: 3 — ABNORMAL LOW
Albumin: 3.1 — ABNORMAL LOW
Bilirubin, Direct: 2.8 — ABNORMAL HIGH
Indirect Bilirubin: 1.7 — ABNORMAL HIGH
Total Bilirubin: 3.9 — ABNORMAL HIGH
Total Bilirubin: 4.5 — ABNORMAL HIGH
Total Protein: 5.8 — ABNORMAL LOW

## 2010-12-20 LAB — CBC
HCT: 43.7
Hemoglobin: 13.8
Hemoglobin: 14.2
MCHC: 32.5
MCHC: 32.8
MCV: 91.2
MCV: 91.9
Platelets: 369
Platelets: 384
Platelets: 397
RBC: 4.17 — ABNORMAL LOW
RBC: 4.19 — ABNORMAL LOW
RBC: 4.75
RDW: 20.6 — ABNORMAL HIGH
RDW: 21 — ABNORMAL HIGH
WBC: 17.7 — ABNORMAL HIGH
WBC: 25.2 — ABNORMAL HIGH
WBC: 28.7 — ABNORMAL HIGH
WBC: 8.5

## 2010-12-20 LAB — BASIC METABOLIC PANEL
Calcium: 8.7
Calcium: 8.9
Creatinine, Ser: 1.27
GFR calc Af Amer: >60
GFR calc non Af Amer: 56 — ABNORMAL LOW
GFR calc non Af Amer: >60
Glucose, Bld: 103 — ABNORMAL HIGH
Sodium: 140
Sodium: 140

## 2010-12-20 LAB — DIFFERENTIAL
Basophils Absolute: 0
Basophils Absolute: 0
Basophils Relative: 0
Eosinophils Absolute: 0
Eosinophils Relative: 0
Eosinophils Relative: 0
Eosinophils Relative: 0
Lymphocytes Relative: 3 — ABNORMAL LOW
Lymphocytes Relative: 3 — ABNORMAL LOW
Lymphs Abs: 0.4 — ABNORMAL LOW
Lymphs Abs: 0.8
Monocytes Absolute: 0.2
Monocytes Absolute: 0.5
Monocytes Relative: 1 — ABNORMAL LOW
Monocytes Relative: 2 — ABNORMAL LOW
Neutro Abs: 17 — ABNORMAL HIGH
Neutrophils Relative %: 96 — ABNORMAL HIGH

## 2010-12-20 LAB — T4, FREE: Free T4: 0.85 — ABNORMAL LOW

## 2010-12-20 LAB — URINE CULTURE

## 2010-12-20 LAB — URINE MICROSCOPIC-ADD ON

## 2010-12-20 LAB — COMPREHENSIVE METABOLIC PANEL WITH GFR
ALT: 108 — ABNORMAL HIGH
Calcium: 9.3
Glucose, Bld: 94
Sodium: 141
Total Protein: 6.2

## 2010-12-20 LAB — LIPASE, BLOOD
Lipase: 2517 — ABNORMAL HIGH
Lipase: 59

## 2010-12-20 LAB — TRIGLYCERIDES: Triglycerides: 63

## 2010-12-20 LAB — SAMPLE TO BLOOD BANK

## 2010-12-20 LAB — AMYLASE
Amylase: 1478 — ABNORMAL HIGH
Amylase: 482 — ABNORMAL HIGH

## 2010-12-20 LAB — T3: T3, Total: 55.2 — ABNORMAL LOW

## 2010-12-20 LAB — TSH: TSH: 0.018 — ABNORMAL LOW

## 2010-12-20 LAB — PHOSPHORUS: Phosphorus: 3

## 2010-12-20 LAB — D-DIMER, QUANTITATIVE: D-Dimer, Quant: 3.72 — ABNORMAL HIGH

## 2011-03-28 DIAGNOSIS — S335XXA Sprain of ligaments of lumbar spine, initial encounter: Secondary | ICD-10-CM | POA: Diagnosis not present

## 2011-05-09 DIAGNOSIS — M069 Rheumatoid arthritis, unspecified: Secondary | ICD-10-CM | POA: Diagnosis not present

## 2011-05-09 DIAGNOSIS — Z79899 Other long term (current) drug therapy: Secondary | ICD-10-CM | POA: Diagnosis not present

## 2011-05-21 DIAGNOSIS — E291 Testicular hypofunction: Secondary | ICD-10-CM | POA: Diagnosis not present

## 2011-05-23 ENCOUNTER — Other Ambulatory Visit: Payer: Self-pay

## 2011-05-23 ENCOUNTER — Emergency Department (HOSPITAL_COMMUNITY)
Admission: EM | Admit: 2011-05-23 | Discharge: 2011-05-23 | Disposition: A | Discharge status: Home / Self Care | Payer: Medicare Other | Attending: Emergency Medicine | Admitting: Emergency Medicine

## 2011-05-23 ENCOUNTER — Emergency Department (HOSPITAL_COMMUNITY): Payer: Medicare Other

## 2011-05-23 DIAGNOSIS — R112 Nausea with vomiting, unspecified: Secondary | ICD-10-CM | POA: Diagnosis not present

## 2011-05-23 DIAGNOSIS — R509 Fever, unspecified: Secondary | ICD-10-CM | POA: Diagnosis not present

## 2011-05-23 DIAGNOSIS — Z79899 Other long term (current) drug therapy: Secondary | ICD-10-CM | POA: Diagnosis not present

## 2011-05-23 DIAGNOSIS — M129 Arthropathy, unspecified: Secondary | ICD-10-CM | POA: Insufficient documentation

## 2011-05-23 DIAGNOSIS — R197 Diarrhea, unspecified: Secondary | ICD-10-CM | POA: Diagnosis not present

## 2011-05-23 DIAGNOSIS — K5289 Other specified noninfective gastroenteritis and colitis: Secondary | ICD-10-CM | POA: Diagnosis not present

## 2011-05-23 DIAGNOSIS — J811 Chronic pulmonary edema: Secondary | ICD-10-CM

## 2011-05-23 DIAGNOSIS — R109 Unspecified abdominal pain: Secondary | ICD-10-CM | POA: Insufficient documentation

## 2011-05-23 DIAGNOSIS — R5381 Other malaise: Secondary | ICD-10-CM | POA: Diagnosis not present

## 2011-05-23 DIAGNOSIS — R5383 Other fatigue: Secondary | ICD-10-CM | POA: Diagnosis not present

## 2011-05-23 DIAGNOSIS — K529 Noninfective gastroenteritis and colitis, unspecified: Secondary | ICD-10-CM

## 2011-05-23 LAB — URINE MICROSCOPIC-ADD ON

## 2011-05-23 LAB — COMPREHENSIVE METABOLIC PANEL
ALT: 25 U/L (ref 0–53)
CO2: 29 mEq/L (ref 19–32)
Calcium: 9.6 mg/dL (ref 8.4–10.5)
Creatinine, Ser: 1.26 mg/dL (ref 0.50–1.35)
GFR calc Af Amer: 62 mL/min — ABNORMAL LOW (ref >90)
GFR calc non Af Amer: 54 mL/min — ABNORMAL LOW (ref >90)
Glucose, Bld: 99 mg/dL (ref 70–99)
Sodium: 137 mEq/L (ref 135–145)
Total Protein: 6.9 g/dL (ref 6.0–8.3)

## 2011-05-23 LAB — DIFFERENTIAL
Eosinophils Absolute: 0.1 10*3/uL (ref 0.0–0.7)
Eosinophils Relative: 0 % (ref 0–5)
Lymphs Abs: 0.8 10*3/uL (ref 0.7–4.0)
Monocytes Absolute: 0.8 10*3/uL (ref 0.1–1.0)

## 2011-05-23 LAB — CBC
HCT: 46.7 % (ref 39.0–52.0)
MCH: 34.1 pg — ABNORMAL HIGH (ref 26.0–34.0)
MCV: 102.2 fL — ABNORMAL HIGH (ref 78.0–100.0)
Platelets: 286 10*3/uL (ref 150–400)
RBC: 4.57 MIL/uL (ref 4.22–5.81)
RDW: 14.3 % (ref 11.5–15.5)

## 2011-05-23 LAB — URINALYSIS, ROUTINE W REFLEX MICROSCOPIC
Hgb urine dipstick: NEGATIVE
Ketones, ur: 15 mg/dL — AB
Specific Gravity, Urine: 1.025 (ref 1.005–1.030)

## 2011-05-23 LAB — LIPASE, BLOOD: Lipase: 22 U/L (ref 11–59)

## 2011-05-23 LAB — CARDIAC PANEL(CRET KIN+CKTOT+MB+TROPI)
CK, MB: 2.1 ng/mL (ref 0.3–4.0)
Total CK: 112 U/L (ref 7–232)

## 2011-05-23 MED ORDER — PANTOPRAZOLE SODIUM 40 MG IV SOLR
40.0000 mg | Freq: Once | INTRAVENOUS | Status: AC
  Administered 2011-05-23: 40 mg via INTRAVENOUS
  Filled 2011-05-23: qty 40

## 2011-05-23 MED ORDER — TRAMADOL HCL 50 MG PO TABS: 50.0000 mg | ORAL_TABLET | Freq: Four times a day (QID) | ORAL | Status: AC | PRN

## 2011-05-23 MED ORDER — GI COCKTAIL ~~LOC~~
30.0000 mL | Freq: Once | ORAL | Status: AC
  Administered 2011-05-23: 30 mL via ORAL
  Filled 2011-05-23: qty 30

## 2011-05-23 MED ORDER — SODIUM CHLORIDE 0.9 % IV BOLUS (SEPSIS)
500.0000 mL | Freq: Once | INTRAVENOUS | Status: AC
  Administered 2011-05-23: 08:00:00 via INTRAVENOUS

## 2011-05-23 MED ORDER — FUROSEMIDE 10 MG/ML IJ SOLN
20.0000 mg | Freq: Once | INTRAMUSCULAR | Status: AC
  Administered 2011-05-23: 20 mg via INTRAVENOUS
  Filled 2011-05-23: qty 2

## 2011-05-23 MED ORDER — MORPHINE SULFATE 4 MG/ML IJ SOLN
4.0000 mg | Freq: Once | INTRAMUSCULAR | Status: AC
  Administered 2011-05-23: 4 mg via INTRAVENOUS
  Filled 2011-05-23: qty 1

## 2011-05-23 MED ORDER — ONDANSETRON HCL 4 MG PO TABS: 4.0000 mg | ORAL_TABLET | Freq: Four times a day (QID) | ORAL | Status: AC

## 2011-05-23 MED ORDER — ONDANSETRON HCL 4 MG/2ML IJ SOLN
4.0000 mg | Freq: Once | INTRAMUSCULAR | Status: AC
  Administered 2011-05-23: 4 mg via INTRAVENOUS
  Filled 2011-05-23: qty 2

## 2011-05-23 MED ORDER — METHYLPREDNISOLONE SODIUM SUCC 125 MG IJ SOLR
125.0000 mg | Freq: Once | INTRAMUSCULAR | Status: AC
  Administered 2011-05-23: 125 mg via INTRAVENOUS
  Filled 2011-05-23: qty 2

## 2011-05-23 MED ORDER — WHITE PETROLATUM GEL
Status: AC
  Filled 2011-05-23: qty 5

## 2011-05-23 MED ORDER — SODIUM CHLORIDE 0.9 % IV BOLUS (SEPSIS): 500.0000 mL | Freq: Once | INTRAVENOUS | Status: DC

## 2011-05-23 NOTE — Discharge Instructions (Signed)
 B.R.A.T. Diet Your doctor has recommended the B.R.A.T. diet for you or your child until the condition improves. This is often used to help control diarrhea and vomiting symptoms. If you or your child can tolerate clear liquids, you may have:  Bananas.   Rice.   Applesauce.   Toast (and other simple starches such as crackers, potatoes, noodles).  Be sure to avoid dairy products, meats, and fatty foods until symptoms are better. Fruit juices such as apple, grape, and prune juice can make diarrhea worse. Avoid these. Continue this diet for 2 days or as instructed by your caregiver. Document Released: 02/19/2005 Document Revised: 02/08/2011 Document Reviewed: 08/08/2006 [facility] Patient Information 2012 [place], [place].Viral Gastroenteritis Viral gastroenteritis is also known as stomach flu. This condition affects the stomach and intestinal tract. It can cause sudden diarrhea and vomiting. The illness typically lasts 3 to 8 days. Most people develop an immune response that eventually gets rid of the virus. While this natural response develops, the virus can make you quite ill. CAUSES  Many different viruses can cause gastroenteritis, such as rotavirus or noroviruses. You can catch one of these viruses by consuming contaminated food or water. You may also catch a virus by sharing utensils or other personal items with an infected person or by touching a contaminated surface. SYMPTOMS  The most common symptoms are diarrhea and vomiting. These problems can cause a severe loss of body fluids (dehydration) and a body salt (electrolyte) imbalance. Other symptoms may include:  Fever.   Headache.   Fatigue.   Abdominal pain.  DIAGNOSIS  Your caregiver can usually diagnose viral gastroenteritis based on your symptoms and a physical exam. A stool sample may also be taken to test for the presence of viruses or other infections. TREATMENT  This illness typically goes away on its own. Treatments are aimed  at rehydration. The most serious cases of viral gastroenteritis involve vomiting so severely that you are not able to keep fluids down. In these cases, fluids must be given through an intravenous line (IV). HOME CARE INSTRUCTIONS   Drink enough fluids to keep your urine clear or pale yellow. Drink small amounts of fluids frequently and increase the amounts as tolerated.   Ask your caregiver for specific rehydration instructions.   Avoid:   Foods high in sugar.   Alcohol.   Carbonated drinks.   Tobacco.   Juice.   Caffeine drinks.   Extremely hot or cold fluids.   Fatty, greasy foods.   Too much intake of anything at one time.   Dairy products until 24 to 48 hours after diarrhea stops.   You may consume probiotics. Probiotics are active cultures of beneficial bacteria. They may lessen the amount and number of diarrheal stools in adults. Probiotics can be found in yogurt with active cultures and in supplements.   Wash your hands well to avoid spreading the virus.   Only take over-the-counter or prescription medicines for pain, discomfort, or fever as directed by your caregiver. Do not give aspirin to children. Antidiarrheal medicines are not recommended.   Ask your caregiver if you should continue to take your regular prescribed and over-the-counter medicines.   Keep all follow-up appointments as directed by your caregiver.  SEEK IMMEDIATE MEDICAL CARE IF:   You are unable to keep fluids down.   You do not urinate at least once every 6 to 8 hours.   You develop shortness of breath.   You notice blood in your stool or vomit. This may  look like coffee grounds.   You have abdominal pain that increases or is concentrated in one small area (localized).   You have persistent vomiting or diarrhea.   You have a fever.   The patient is a child younger than 3 months, and he or she has a fever.   The patient is a child older than 3 months, and he or she has a fever and  persistent symptoms.   The patient is a child older than 3 months, and he or she has a fever and symptoms suddenly get worse.   The patient is a baby, and he or she has no tears when crying.  MAKE SURE YOU:   Understand these instructions.   Will watch your condition.   Will get help right away if you are not doing well or get worse.  Document Released: 02/19/2005 Document Revised: 02/08/2011 Document Reviewed: 12/06/2010 [facility] Patient Information 2012 [place], [place].

## 2011-05-23 NOTE — ED Notes (Signed)
Patient transported to X-ray 

## 2011-05-23 NOTE — ED Provider Notes (Signed)
History     CSN: [id number]  Arrival date & time 05/23/11  7829   First MD Initiated Contact with Patient 05/23/11 [phone]      Chief Complaint  Patient presents with  . Nausea    vomiting  and diarrhea,  nagging abdominal pain,  started yesterday morning with diarrhea,  then nausea and vomiting,  getting worse, pt feels weak    (Consider location/radiation/quality/duration/timing/severity/associated sxs/prior treatment) HPI Pt with N/V/D x 2 days with mild upper abdominal pain. +subjective fever, chills and generalized weakness. Pt has had decreased PO intake. No blood in stool or vomit Past Medical History  Diagnosis Date  . Arthritis   . Shingles     Past Surgical History  Procedure Date  . Cholecystectomy open 2010    Family History  Problem Relation Age of Onset  . Heart disease Mother   . Cancer Brother   . Heart disease Brother     History  Substance Use Topics  . Smoking status: Never Smoker   . Smokeless tobacco: Not on file  . Alcohol Use: Yes     glass of wine      Review of Systems  Constitutional: Positive for fever, chills and fatigue.  HENT: Negative for neck pain and neck stiffness.   Respiratory: Negative for cough and shortness of breath.   Cardiovascular: Negative for chest pain, palpitations and leg swelling.  Gastrointestinal: Positive for nausea, vomiting, abdominal pain and diarrhea. Negative for constipation and blood in stool.  Genitourinary: Negative for dysuria.  Musculoskeletal: Negative for myalgias and arthralgias.  Skin: Negative for rash.  Neurological: Negative for dizziness, weakness, numbness and headaches.    Allergies  Review of patient's allergies indicates no known allergies.  Home Medications   Current Outpatient Rx  Name Route Sig Dispense Refill  . BROMOCRIPTINE MESYLATE 2.5 MG PO TABS Oral Take 2.5 mg by mouth 2 (two) times daily.    Marland Kitchen CALCIUM CARBONATE-VITAMIN D 500-125 MG-UNIT PO TABS Oral Take 1 tablet by  mouth 2 (two) times daily.    . CORTISONE ACETATE 25 MG PO TABS Oral Take 12.5-25 mg by mouth See admin instructions. Pt takes 1 tablet in the morning and 1/2 tab at night for 12.5 mg dose    . FOLIC ACID 1 MG PO TABS Oral Take 1 mg by mouth daily.      Marland Kitchen LEVOTHYROXINE SODIUM 137 MCG PO TABS Oral Take 137 mcg by mouth daily.      Marland Kitchen METHOTREXATE 2.5 MG PO TABS Oral Take 12.5 mg by mouth See admin instructions. Caution:Chemotherapy. Protect from light.  Pt takes 5 tabs of 2.5mg  for a total dose of 12.5mg  on Saturday and Sunday. Pt takes a full dose of 25 mg per week.    . CENTRUM SILVER PO Oral Take by mouth.      Marland Kitchen PANTOPRAZOLE SODIUM 40 MG PO TBEC Oral Take 40 mg by mouth 2 (two) times daily.    Marland Kitchen PRAVASTATIN SODIUM 80 MG PO TABS Oral Take 80 mg by mouth daily.    . TESTOSTERONE CYPIONATE 100 MG/ML IM OIL Intramuscular Inject 200 mg into the muscle every 14 (fourteen) days. For IM use only    . VARDENAFIL HCL 10 MG PO TABS Oral Take 10 mg by mouth daily as needed.    Marland Kitchen ONDANSETRON HCL 4 MG PO TABS Oral Take 1 tablet (4 mg total) by mouth every 6 (six) hours. 12 tablet 0    BP 107/63  Pulse 104  Temp(Src) 98 F (36.7 C) (Oral)  Resp 24  SpO2 94%  Physical Exam  Nursing note and vitals reviewed. Constitutional: He is oriented to person, place, and time. He appears well-developed and well-nourished. No distress.  HENT:  Head: Normocephalic and atraumatic.  Mouth/Throat: Oropharynx is clear and moist.  Eyes: EOM are normal. Pupils are equal, round, and reactive to light.  Neck: Normal range of motion. Neck supple.  Cardiovascular: Normal rate and regular rhythm.   Pulmonary/Chest: Effort normal and breath sounds normal. No respiratory distress. He has no wheezes. He has no rales.  Abdominal: Soft. Bowel sounds are normal. There is no tenderness. There is no rebound and no guarding.       Abd soft with no tenderness to deep palpation  Musculoskeletal: Normal range of motion. He exhibits no  edema and no tenderness.  Neurological: He is alert and oriented to person, place, and time.       Moves all ext, sensation intact  Skin: Skin is warm and dry. No rash noted. No erythema.  Psychiatric: He has a normal mood and affect. His behavior is normal.    ED Course  Procedures (including critical care time)  Labs Reviewed  CBC - Abnormal; Notable for the following:    WBC 15.6 (*)    MCV 102.2 (*)    MCH 34.1 (*)    All other components within normal limits  DIFFERENTIAL - Abnormal; Notable for the following:    Neutrophils Relative 89 (*)    Neutro Abs 13.9 (*)    Lymphocytes Relative 5 (*)    All other components within normal limits  COMPREHENSIVE METABOLIC PANEL - Abnormal; Notable for the following:    BUN 26 (*)    GFR calc non Af Amer 54 (*)    GFR calc Af Amer 62 (*)    All other components within normal limits  URINALYSIS, ROUTINE W REFLEX MICROSCOPIC - Abnormal; Notable for the following:    Color, Urine AMBER (*) BIOCHEMICALS MAY BE AFFECTED BY COLOR   APPearance CLOUDY (*)    Bilirubin Urine MODERATE (*)    Ketones, ur 15 (*)    Leukocytes, UA TRACE (*)    All other components within normal limits  URINE MICROSCOPIC-ADD ON - Abnormal; Notable for the following:    Bacteria, UA FEW (*)    Casts HYALINE CASTS (*)    All other components within normal limits  LIPASE, BLOOD  CARDIAC PANEL(CRET KIN+CKTOT+MB+TROPI)  PRO B NATRIURETIC PEPTIDE   Dg Abd Acute W/chest  05/23/2011  *RADIOLOGY REPORT*  Clinical Data: Abdominal pain, nausea and vomiting  ACUTE ABDOMEN SERIES (ABDOMEN 2 VIEW & CHEST 1 VIEW)  Comparison: Chest x-ray of 07/19/2009  Findings: No infiltrate or effusion is seen.  There is some fullness of perihilar vasculature and mild fluid overload is a consideration.  The heart is within upper limits normal.  The descending thoracic aorta is ectatic.  Supine and left lateral decubitus films of the abdomen show no bowel obstruction.  No free air is seen.   There do appear to be small renal calculi present.  Surgical clips are present in the right upper quadrant from prior cholecystectomy.  There are extensive degenerative changes throughout the lumbar spine.  IMPRESSION:  1.  Question mild fluid overload. 2.  No bowel obstruction or free air. 3.  Probable small bilateral renal calculi.  Original Report Authenticated By: Juline Patch, M.D.     1. Gastroenteritis   2. Pulmonary  edema      Date: 05/23/2011  Rate:98  Rhythm: normal sinus rhythm  QRS Axis: normal  Intervals: normal  ST/T Wave abnormalities: normal  Conduction Disutrbances:none  Narrative Interpretation:   Old EKG Reviewed: unchanged    MDM  Suspect viral AGE and mild dehydration.   Pt is feeling much better and now tolerating PO's. No vomiting or diarrhea in ED.   Discussed with pt's PMD. Reviewed chart. Pt does not want to stay in hospital. O2 sats in low 90's. OK to d/c home and will f/u with Dr Virgel Manifold at [facility] tomorrow morning. Pt given instructions to return immediately for worsening SOB, fever, chest pain or any concerns. Pt and wife agree with plan.   Loren Racer, MD 05/23/11 1451

## 2011-05-24 DIAGNOSIS — J811 Chronic pulmonary edema: Secondary | ICD-10-CM | POA: Diagnosis not present

## 2011-05-24 DIAGNOSIS — E230 Hypopituitarism: Secondary | ICD-10-CM | POA: Diagnosis not present

## 2011-05-24 DIAGNOSIS — A0839 Other viral enteritis: Secondary | ICD-10-CM | POA: Diagnosis not present

## 2011-05-24 DIAGNOSIS — E291 Testicular hypofunction: Secondary | ICD-10-CM | POA: Diagnosis not present

## 2011-05-29 DIAGNOSIS — I270 Primary pulmonary hypertension: Secondary | ICD-10-CM | POA: Diagnosis not present

## 2011-05-30 DIAGNOSIS — I359 Nonrheumatic aortic valve disorder, unspecified: Secondary | ICD-10-CM | POA: Diagnosis not present

## 2011-05-31 DIAGNOSIS — E230 Hypopituitarism: Secondary | ICD-10-CM | POA: Diagnosis not present

## 2011-05-31 DIAGNOSIS — A689 Relapsing fever, unspecified: Secondary | ICD-10-CM | POA: Diagnosis not present

## 2011-05-31 DIAGNOSIS — J811 Chronic pulmonary edema: Secondary | ICD-10-CM | POA: Diagnosis not present

## 2011-06-18 DIAGNOSIS — E039 Hypothyroidism, unspecified: Secondary | ICD-10-CM | POA: Diagnosis not present

## 2011-06-18 DIAGNOSIS — A689 Relapsing fever, unspecified: Secondary | ICD-10-CM | POA: Diagnosis not present

## 2011-06-18 DIAGNOSIS — E230 Hypopituitarism: Secondary | ICD-10-CM | POA: Diagnosis not present

## 2011-06-18 DIAGNOSIS — E291 Testicular hypofunction: Secondary | ICD-10-CM | POA: Diagnosis not present

## 2011-06-21 DIAGNOSIS — L570 Actinic keratosis: Secondary | ICD-10-CM | POA: Diagnosis not present

## 2011-06-26 DIAGNOSIS — A689 Relapsing fever, unspecified: Secondary | ICD-10-CM | POA: Diagnosis not present

## 2011-06-27 ENCOUNTER — Encounter (HOSPITAL_COMMUNITY): Payer: Self-pay | Admitting: General Practice

## 2011-06-27 ENCOUNTER — Inpatient Hospital Stay (HOSPITAL_COMMUNITY)
Admission: AD | Admit: 2011-06-27 | Discharge: 2011-07-01 | DRG: 194 | Disposition: A | Discharge status: Home / Self Care | Payer: Medicare Other | Source: Ambulatory Visit | Attending: Internal Medicine | Admitting: Internal Medicine

## 2011-06-27 DIAGNOSIS — J189 Pneumonia, unspecified organism: Secondary | ICD-10-CM | POA: Diagnosis not present

## 2011-06-27 DIAGNOSIS — E860 Dehydration: Secondary | ICD-10-CM | POA: Diagnosis not present

## 2011-06-27 DIAGNOSIS — E230 Hypopituitarism: Secondary | ICD-10-CM | POA: Diagnosis present

## 2011-06-27 DIAGNOSIS — Z87891 Personal history of nicotine dependence: Secondary | ICD-10-CM

## 2011-06-27 DIAGNOSIS — M069 Rheumatoid arthritis, unspecified: Secondary | ICD-10-CM | POA: Diagnosis present

## 2011-06-27 DIAGNOSIS — Z8249 Family history of ischemic heart disease and other diseases of the circulatory system: Secondary | ICD-10-CM | POA: Diagnosis not present

## 2011-06-27 DIAGNOSIS — E785 Hyperlipidemia, unspecified: Secondary | ICD-10-CM | POA: Diagnosis present

## 2011-06-27 DIAGNOSIS — R000 Tachycardia, unspecified: Secondary | ICD-10-CM | POA: Diagnosis not present

## 2011-06-27 DIAGNOSIS — Z79899 Other long term (current) drug therapy: Secondary | ICD-10-CM | POA: Diagnosis not present

## 2011-06-27 DIAGNOSIS — M1990 Unspecified osteoarthritis, unspecified site: Secondary | ICD-10-CM | POA: Diagnosis present

## 2011-06-27 DIAGNOSIS — C649 Malignant neoplasm of unspecified kidney, except renal pelvis: Secondary | ICD-10-CM | POA: Diagnosis present

## 2011-06-27 DIAGNOSIS — M059 Rheumatoid arthritis with rheumatoid factor, unspecified: Secondary | ICD-10-CM | POA: Diagnosis present

## 2011-06-27 DIAGNOSIS — R05 Cough: Secondary | ICD-10-CM | POA: Diagnosis not present

## 2011-06-27 DIAGNOSIS — A689 Relapsing fever, unspecified: Secondary | ICD-10-CM | POA: Diagnosis not present

## 2011-06-27 DIAGNOSIS — D696 Thrombocytopenia, unspecified: Secondary | ICD-10-CM | POA: Diagnosis not present

## 2011-06-27 HISTORY — DX: Pneumonia, unspecified organism: J18.9

## 2011-06-27 HISTORY — DX: Benign neoplasm of pituitary gland: D35.2

## 2011-06-27 MED ORDER — TESTOSTERONE CYPIONATE 100 MG/ML IM SOLN: 200.0000 mg | INTRAMUSCULAR | Status: DC

## 2011-06-27 MED ORDER — METHOTREXATE 2.5 MG PO TABS
12.5000 mg | ORAL_TABLET | ORAL | Status: DC
  Administered 2011-06-30 – 2011-07-01 (×2): 12.5 mg via ORAL
  Filled 2011-06-27 (×2): qty 5

## 2011-06-27 MED ORDER — CORTISONE ACETATE 25 MG PO TABS: 20.0000 mg | ORAL_TABLET | Freq: Every day | ORAL | Status: DC

## 2011-06-27 MED ORDER — SODIUM CHLORIDE 0.9 % IJ SOLN
3.0000 mL | Freq: Two times a day (BID) | INTRAMUSCULAR | Status: DC
  Administered 2011-06-28 – 2011-07-01 (×5): 3 mL via INTRAVENOUS

## 2011-06-27 MED ORDER — SIMVASTATIN 40 MG PO TABS
40.0000 mg | ORAL_TABLET | Freq: Every day | ORAL | Status: DC
  Administered 2011-06-27 – 2011-06-30 (×4): 40 mg via ORAL
  Filled 2011-06-27 (×5): qty 1

## 2011-06-27 MED ORDER — ENOXAPARIN SODIUM 40 MG/0.4ML ~~LOC~~ SOLN
40.0000 mg | Freq: Every day | SUBCUTANEOUS | Status: DC
  Administered 2011-06-27 – 2011-06-30 (×4): 40 mg via SUBCUTANEOUS
  Filled 2011-06-27 (×5): qty 0.4

## 2011-06-27 MED ORDER — ACETAMINOPHEN 650 MG RE SUPP: 650.0000 mg | Freq: Four times a day (QID) | RECTAL | Status: DC | PRN

## 2011-06-27 MED ORDER — PIPERACILLIN-TAZOBACTAM 3.375 G IVPB
3.3750 g | Freq: Three times a day (TID) | INTRAVENOUS | Status: DC
  Administered 2011-06-27 – 2011-07-01 (×11): 3.375 g via INTRAVENOUS
  Filled 2011-06-27 (×14): qty 50

## 2011-06-27 MED ORDER — CALCIUM CARBONATE-VITAMIN D 500-125 MG-UNIT PO TABS: 1.0000 | ORAL_TABLET | Freq: Two times a day (BID) | ORAL | Status: DC

## 2011-06-27 MED ORDER — SODIUM CHLORIDE 0.9 % IV SOLN: 250.0000 mL | INTRAVENOUS | Status: DC | PRN

## 2011-06-27 MED ORDER — ADULT MULTIVITAMIN W/MINERALS CH
1.0000 | ORAL_TABLET | Freq: Every day | ORAL | Status: DC
  Administered 2011-06-28 – 2011-07-01 (×4): 1 via ORAL
  Filled 2011-06-27 (×4): qty 1

## 2011-06-27 MED ORDER — LEVOTHYROXINE SODIUM 137 MCG PO TABS
137.0000 ug | ORAL_TABLET | Freq: Every day | ORAL | Status: DC
  Administered 2011-06-28 – 2011-07-01 (×4): 137 ug via ORAL
  Filled 2011-06-27 (×5): qty 1

## 2011-06-27 MED ORDER — CALCIUM CARBONATE-VITAMIN D 500-200 MG-UNIT PO TABS
1.0000 | ORAL_TABLET | Freq: Two times a day (BID) | ORAL | Status: DC
  Administered 2011-06-27 – 2011-07-01 (×8): 1 via ORAL
  Filled 2011-06-27 (×9): qty 1

## 2011-06-27 MED ORDER — HYDROCORTISONE 20 MG PO TABS
40.0000 mg | ORAL_TABLET | Freq: Every day | ORAL | Status: DC
  Administered 2011-06-28 – 2011-07-01 (×4): 40 mg via ORAL
  Filled 2011-06-27 (×4): qty 2

## 2011-06-27 MED ORDER — BROMOCRIPTINE MESYLATE 2.5 MG PO TABS
2.5000 mg | ORAL_TABLET | Freq: Two times a day (BID) | ORAL | Status: DC
  Administered 2011-06-27 – 2011-07-01 (×8): 2.5 mg via ORAL
  Filled 2011-06-27 (×9): qty 1

## 2011-06-27 MED ORDER — ACETAMINOPHEN 325 MG PO TABS
650.0000 mg | ORAL_TABLET | Freq: Four times a day (QID) | ORAL | Status: DC | PRN
  Administered 2011-06-27 – 2011-06-28 (×3): 650 mg via ORAL
  Filled 2011-06-27 (×3): qty 2

## 2011-06-27 MED ORDER — SODIUM CHLORIDE 0.9 % IV SOLN
INTRAVENOUS | Status: DC
  Administered 2011-06-27: 20:00:00 via INTRAVENOUS

## 2011-06-27 MED ORDER — CORTISONE ACETATE 25 MG PO TABS: 40.0000 mg | ORAL_TABLET | Freq: Every day | ORAL | Status: DC

## 2011-06-27 MED ORDER — HYDROCORTISONE 20 MG PO TABS
20.0000 mg | ORAL_TABLET | Freq: Every day | ORAL | Status: DC
  Administered 2011-06-27 – 2011-06-30 (×4): 20 mg via ORAL
  Filled 2011-06-27 (×5): qty 1

## 2011-06-27 MED ORDER — VANCOMYCIN HCL 1000 MG IV SOLR
750.0000 mg | Freq: Two times a day (BID) | INTRAVENOUS | Status: DC
  Administered 2011-06-28 – 2011-06-30 (×6): 750 mg via INTRAVENOUS
  Filled 2011-06-27 (×8): qty 750

## 2011-06-27 MED ORDER — LEVOFLOXACIN 750 MG PO TABS
750.0000 mg | ORAL_TABLET | Freq: Every day | ORAL | Status: DC
  Administered 2011-06-28 – 2011-06-30 (×3): 750 mg via ORAL
  Filled 2011-06-27 (×7): qty 1

## 2011-06-27 MED ORDER — CENTRUM SILVER PO TABS: 1.0000 | ORAL_TABLET | Freq: Every morning | ORAL | Status: DC

## 2011-06-27 MED ORDER — ALBUTEROL SULFATE (5 MG/ML) 0.5% IN NEBU
2.5000 mg | INHALATION_SOLUTION | Freq: Four times a day (QID) | RESPIRATORY_TRACT | Status: DC
  Administered 2011-06-27: 2.5 mg via RESPIRATORY_TRACT
  Filled 2011-06-27: qty 0.5

## 2011-06-27 MED ORDER — GUAIFENESIN-DM 100-10 MG/5ML PO SYRP
5.0000 mL | ORAL_SOLUTION | ORAL | Status: DC | PRN
  Administered 2011-06-27 – 2011-06-28 (×2): 5 mL via ORAL
  Filled 2011-06-27 (×2): qty 5

## 2011-06-27 MED ORDER — FOLIC ACID 1 MG PO TABS
1.0000 mg | ORAL_TABLET | Freq: Every day | ORAL | Status: DC
  Administered 2011-06-28: 11:00:00 via ORAL
  Administered 2011-06-29 – 2011-07-01 (×3): 1 mg via ORAL
  Filled 2011-06-27 (×4): qty 1

## 2011-06-27 MED ORDER — TRAMADOL HCL 50 MG PO TABS
50.0000 mg | ORAL_TABLET | Freq: Four times a day (QID) | ORAL | Status: DC | PRN
  Administered 2011-06-27: 50 mg via ORAL
  Filled 2011-06-27: qty 1

## 2011-06-27 MED ORDER — PANTOPRAZOLE SODIUM 40 MG PO TBEC
40.0000 mg | DELAYED_RELEASE_TABLET | Freq: Two times a day (BID) | ORAL | Status: DC
  Administered 2011-06-27 – 2011-07-01 (×8): 40 mg via ORAL
  Filled 2011-06-27 (×7): qty 1

## 2011-06-27 MED ORDER — SODIUM CHLORIDE 0.9 % IJ SOLN: 3.0000 mL | INTRAMUSCULAR | Status: DC | PRN

## 2011-06-27 MED ORDER — BENZONATATE 100 MG PO CAPS
100.0000 mg | ORAL_CAPSULE | Freq: Three times a day (TID) | ORAL | Status: DC
  Administered 2011-06-27 – 2011-07-01 (×11): 100 mg via ORAL
  Filled 2011-06-27 (×14): qty 1

## 2011-06-27 MED ORDER — VANCOMYCIN HCL IN DEXTROSE 1-5 GM/200ML-% IV SOLN
1000.0000 mg | Freq: Once | INTRAVENOUS | Status: AC
  Administered 2011-06-27: 1000 mg via INTRAVENOUS
  Filled 2011-06-27: qty 200

## 2011-06-27 NOTE — H&P (Signed)
PCP:  Hoyle Sauer, MD, MD   DOA:  06/27/2011  3:04 PM  Chief Complaint:  Cough with fever and chills for 5 days  HPI: 27 y/opleasent male with hx of panhypopituitarism  Secondary to pituitary adenoma, rheumatoid arthritis, renal cell ca ( bx done in 09 and not on any treatment), DJD, hx of tobacco use in past was seen at PCP office today for persistent productive cough with chills , sweating and fever at home for 5 days. He informs having a viral gastroenteritis 1 month back that made him go to [place] [facility] ED and given IV fluids for the whole day in ED. He hadnot fully recovered from this viral illness and felt very weak with loss of appetite and almost lost 10 pounds over past 1 month. His PCP increased his dose of cortisone with plan on taper over next few weeks. patient had started to feel better but then started having these symptoms. He went to his PCP and was noted to have temp of 103. Blood cx was taken and CXR was done showing RUL pneumonia and requested for admission to hospital. patient informs of increase sweating with feeling hot and green productive sputum for past 5 days. Denies sick contact except or recent ED visit and prior to that visited hospital to see a sick friend. Denies recent travel. Denies nausea, vomiting, headache, chest pain but did have some DOE with exertion and cough. At baseline he actively works out and plays tennis. Denies bowel or urinary symptoms.  Allergies: No Known Allergies  Prior to Admission medications   Medication Sig Start Date End Date Taking? Authorizing Provider  bromocriptine (PARLODEL) 2.5 MG tablet Take 2.5 mg by mouth 2 (two) times daily.   Yes Historical Provider, MD  Calcium Carbonate-Vitamin D (CALCIUM 500 + D) 500-125 MG-UNIT TABS Take 1 tablet by mouth 2 (two) times daily.   Yes Historical Provider, MD  cortisone (CORTONE) 25 MG tablet Take 20-40 mg by mouth 2 (two) times daily. 2 tablets in the morning and 1 at night   Yes Historical  Provider, MD  folic acid (FOLVITE) 1 MG tablet Take 1 mg by mouth daily.     Yes Historical Provider, MD  levothyroxine (SYNTHROID, LEVOTHROID) 137 MCG tablet Take 137 mcg by mouth daily.     Yes Historical Provider, MD  methotrexate (RHEUMATREX) 2.5 MG tablet Take 12.5 mg by mouth See admin instructions. Caution:Chemotherapy. Protect from light.  Pt takes 5 tabs of 2.5mg  for a total dose of 12.5mg  on Saturday and Sunday. Pt takes a full dose of 25 mg per week.   Yes Historical Provider, MD  Multiple Vitamins-Minerals (CENTRUM SILVER PO) Take by mouth.     Yes Historical Provider, MD  pantoprazole (PROTONIX) 40 MG tablet Take 40 mg by mouth 2 (two) times daily.   Yes Historical Provider, MD  pravastatin (PRAVACHOL) 80 MG tablet Take 80 mg by mouth daily.   Yes Historical Provider, MD  testosterone cypionate (DEPOTESTOTERONE CYPIONATE) 100 MG/ML injection Inject 200 mg into the muscle every 14 (fourteen) days. For IM use only   Yes Historical Provider, MD  vardenafil (LEVITRA) 10 MG tablet Take 10 mg by mouth daily as needed.   Yes Historical Provider, MD    Past Medical History  Diagnosis Date  . Arthritis   . Shingles     Past Surgical History  Procedure Date  . Cholecystectomy open 2010    Social History: Smoked remotely  and now quit many years ago.  Drinks a glass of red wine daily. No illicit drug use  Family History  Problem Relation Age of Onset  . Heart disease Mother   . Cancer Brother   . Heart disease Brother     Review of Systems:( positive  issues in bold ) General: positive for fever, chills, diaphoresis, appetite change and fatigue.  HEENT: Denies photophobia, eye pain, redness, hearing loss, ear pain, congestion, sore throat, rhinorrhea, sneezing, mouth sores, trouble swallowing, neck pain, neck stiffness and tinnitus.   Respiratory: Denies SOB,  DOE, cough, chest tightness,  and wheezing.   Cardiovascular: Denies chest pain, palpitations and leg swelling.    Gastrointestinal: Denies nausea, vomiting, abdominal pain, diarrhea, constipation, blood in stool and abdominal distention.  Genitourinary: Denies dysuria, urgency, frequency, hematuria, flank pain and difficulty urinating.  Musculoskeletal: Denies myalgias, back pain, joint swelling, arthralgias and gait problem.  Skin: Denies pallor, rash and wound.  Neurological: Denies dizziness, seizures, syncope, weakness, light-headedness, numbness and headaches.  Hematological: Denies adenopathy. Easy bruising, personal or family bleeding history  Psychiatric/Behavioral: Denies suicidal ideation, mood changes, confusion, nervousness, sleep disturbance and agitation   Physical Exam:  Filed Vitals:   06/27/11 1523  BP: 143/79  Pulse: 114  Temp: 98.9 F (37.2 C)  Resp: 20  SpO2: 92%    Constitutional: Vital signs reviewed.  Patient is a well-developed and well-nourished in no acute distress and cooperative with exam. Alert and oriented x3.  Head: Normocephalic and atraumatic, feels warm to touch Ear: TM normal bilaterally Mouth: no erythema or exudates, MMM Eyes: PERRL, EOMI, conjunctivae normal, No scleral icterus.  Neck: Supple, Trachea midline normal ROM, No JVD, mass, thyromegaly, or carotid bruit present.  Cardiovascular: RRR, S1 normal, S2 normal, no MRG, pulses symmetric and intact bilaterally Pulmonary/Chest: CTAB, no wheezes, rales, expiratory ronchi over rt upper lung Abdominal: Soft. Non-tender, non-distended, bowel sounds are normal, no masses, organomegaly, or guarding present.  GU: no CVA tenderness Musculoskeletal: left PIP deformity , erythema, or stiffness, ROM full and no nontender Ext: no edema and no cyanosis, pulses palpable bilaterally (DP and PT) Hematology: no cervical, inginal, or axillary adenopathy.  Neurological: A&O x3, Strenght is normal and symmetric bilaterally, cranial nerve II-XII are grossly intact, no focal motor deficit, sensory intact to light touch  bilaterally.  Skin: Warm, dry and intact. No rash, cyanosis, or clubbing.  Psychiatric: Normal mood and affect. speech and behavior is normal. Judgment and thought content normal. Cognition and memory are normal.   Labs on Admission:  From PCP office Wbc 25 .70, hb 12.9/ hct 39, plt 578 Na 135, k -3.9, cl-101, co2-24, bun 15, cr 0.8, LFTs wnl  Radiological Exams on Admission: CXR done at PCP office shows RUL pneumonia  Assessment/Plan 40 male with panhypopituitarism , RA, hx of tobacco use sent from PCP offcie for 5 days of fever , chills and productive cough secondary to RUL pneumonia   *HCAP (healthcare-associated pneumonia) Given recent hospital visit for gastroenteritis will treat in the line of HCAP.  start empirically on IV vanco, zosyn and levaquin for atypical coverage. Check sputum cx and urine for legionella  albuterol nebs prn, robitussin for cough Blood cx sent from PCP office and needs to be followed Tylenol prn  for fever  repeat cx if febrile Leucocytosis of 25 k , likely in the setting of cortisone use which has been increased in dose recently as outpt -will need CXR as outpt in 4 weeks to evaluate resolution of infiltrate Active Problems:  Rheumatoid arthritis  Panhypopituitarism  Renal cell carcinoma  Degenerative joint disease  History of tobacco use  Hyperlipidemia  Stable, continue home meds   Diet: regular  DVT prophylaxis   full code  Time Spent on Admission: 60 minutes  Traeson Dusza 06/27/2011, 4:56 PM

## 2011-06-27 NOTE — Progress Notes (Signed)
ANTIBIOTIC CONSULT NOTE - INITIAL  Pharmacy Consult for Vancomycin Indication:   No Known Allergies  Patient Measurements: Height: 5\' 9"  (175.3 cm) Weight: 162 lb (73.483 kg) IBW/kg (Calculated) : 70.7  Adjusted Body Weight:   Vital Signs: Temp: 99.7 F (37.6 C) (04/24 2101) Temp src: Oral (04/24 2101) BP: 118/70 mmHg (04/24 2101) Pulse Rate: 108  (04/24 2101) Intake/Output from previous day:   Intake/Output from this shift:    Labs: No results found for this basename: WBC:3,HGB:3,PLT:3,LABCREA:3,CREATININE:3 in the last 72 hours Estimated Creatinine Clearance: 49.9 ml/min (by C-G formula based on Cr of 1.26). No results found for this basename: VANCOTROUGH:2,VANCOPEAK:2,VANCORANDOM:2,GENTTROUGH:2,GENTPEAK:2,GENTRANDOM:2,TOBRATROUGH:2,TOBRAPEAK:2,TOBRARND:2,AMIKACINPEAK:2,AMIKACINTROU:2,AMIKACIN:2, in the last 72 hours   Microbiology: No results found for this or any previous visit (from the past 720 hour(s)).  Medical History: Past Medical History  Diagnosis Date  . Arthritis   . Shingles   . Pneumonia 06/27/11    "first time"  . Benign tumor of pituitary gland     "wasn't able to get it all"    Medications:  Scheduled:    . albuterol  2.5 mg Nebulization Q6H  . benzonatate  100 mg Oral TID  . bromocriptine  2.5 mg Oral BID  . calcium-vitamin D  1 tablet Oral BID  . enoxaparin  40 mg Subcutaneous q1800  . folic acid  1 mg Oral Daily  . hydrocortisone  20 mg Oral QHS  . hydrocortisone  40 mg Oral Daily  . levofloxacin  750 mg Oral q1800  . levothyroxine  137 mcg Oral QAC breakfast  . methotrexate  12.5 mg Oral Custom  . mulitivitamin with minerals  1 tablet Oral Daily  . pantoprazole  40 mg Oral BID WC  . piperacillin-tazobactam (ZOSYN)  IV  3.375 g Intravenous Q8H  . simvastatin  40 mg Oral q1800  . sodium chloride  3 mL Intravenous Q12H  . testosterone cypionate  200 mg Intramuscular Q14 Days  . vancomycin  750 mg Intravenous Q12H  . vancomycin  1,000  mg Intravenous Once  . DISCONTD: Calcium Carbonate-Vitamin D  1 tablet Oral BID  . DISCONTD: CENTRUM SILVER  1 tablet Oral q morning - 10a  . DISCONTD: CENTRUM SILVER  1 tablet Oral q morning - 10a  . DISCONTD: cortisone  25 mg Oral Daily  . DISCONTD: cortisone  37.5 mg Oral Daily   Assessment: 78 yr old male sent from PCP visit to be admitted and treated for HCAP. Was treated at [facility] 1 month ago for gastroenteritis and has not fully recovered his strength.   Goal of Therapy:  Vancomycin trough level 15-20 mcg/ml  Plan:  Vancomycin 1 Gm ordered by MD. Will continue with 750 mg IV q12h. Levels when appropriate.  Ryan Coffey 06/27/2011,11:00 PM

## 2011-06-28 ENCOUNTER — Inpatient Hospital Stay (HOSPITAL_COMMUNITY): Payer: Medicare Other

## 2011-06-28 LAB — BASIC METABOLIC PANEL
BUN: 14 mg/dL (ref 6–23)
Creatinine, Ser: 0.8 mg/dL (ref 0.50–1.35)
GFR calc Af Amer: >90 mL/min (ref >90)
GFR calc non Af Amer: 85 mL/min — ABNORMAL LOW (ref >90)
Glucose, Bld: 108 mg/dL — ABNORMAL HIGH (ref 70–99)
Potassium: 3.7 mEq/L (ref 3.5–5.1)

## 2011-06-28 LAB — EXPECTORATED SPUTUM ASSESSMENT W GRAM STAIN, RFLX TO RESP C

## 2011-06-28 LAB — CBC
HCT: 34.2 % — ABNORMAL LOW (ref 39.0–52.0)
Hemoglobin: 11.5 g/dL — ABNORMAL LOW (ref 13.0–17.0)
MCH: 32.8 pg (ref 26.0–34.0)
MCHC: 33.6 g/dL (ref 30.0–36.0)
RDW: 14.2 % (ref 11.5–15.5)

## 2011-06-28 MED ORDER — GUAIFENESIN-CODEINE 100-10 MG/5ML PO SOLN
5.0000 mL | ORAL | Status: DC | PRN
  Administered 2011-06-28 – 2011-06-29 (×4): 5 mL via ORAL
  Filled 2011-06-28 (×4): qty 5

## 2011-06-28 NOTE — Progress Notes (Signed)
INITIAL ADULT NUTRITION ASSESSMENT Date: 06/28/2011   Time: 4:15 PM Reason for Assessment: Nutrition Risk  ASSESSMENT: Male 78 y.o.  Dx: HCAP (healthcare-associated pneumonia)  Hx:  Past Medical History  Diagnosis Date  . Arthritis   . Shingles   . Pneumonia 06/27/11    "first time"  . Benign tumor of pituitary gland     "wasn't able to get it all"   Related Meds:     . benzonatate  100 mg Oral TID  . bromocriptine  2.5 mg Oral BID  . calcium-vitamin D  1 tablet Oral BID  . enoxaparin  40 mg Subcutaneous q1800  . folic acid  1 mg Oral Daily  . hydrocortisone  20 mg Oral QHS  . hydrocortisone  40 mg Oral Daily  . levofloxacin  750 mg Oral q1800  . levothyroxine  137 mcg Oral QAC breakfast  . methotrexate  12.5 mg Oral Custom  . mulitivitamin with minerals  1 tablet Oral Daily  . pantoprazole  40 mg Oral BID WC  . piperacillin-tazobactam (ZOSYN)  IV  3.375 g Intravenous Q8H  . simvastatin  40 mg Oral q1800  . sodium chloride  3 mL Intravenous Q12H  . testosterone cypionate  200 mg Intramuscular Q14 Days  . vancomycin  750 mg Intravenous Q12H  . vancomycin  1,000 mg Intravenous Once  . DISCONTD: albuterol  2.5 mg Nebulization Q6H  . DISCONTD: Calcium Carbonate-Vitamin D  1 tablet Oral BID  . DISCONTD: CENTRUM SILVER  1 tablet Oral q morning - 10a  . DISCONTD: CENTRUM SILVER  1 tablet Oral q morning - 10a  . DISCONTD: cortisone  25 mg Oral Daily  . DISCONTD: cortisone  37.5 mg Oral Daily   Ht: 5\' 9"  (175.3 cm)  Wt: 162 lb (73.483 kg)  Ideal Wt: 72.7 kg % Ideal Wt: 101%  Usual Wt: 170 lbs % Usual Wt: 95%  Body mass index is 23.92 kg/(m^2). WNL  Food/Nutrition Related Hx:  Per pt/wife pt started to lose wt during gi illness recently but has continued to lose wt and appetite has remained poor. Pt reports 5% wt loss in the last month. Reports intake has been very poor, per 24 hr recall pt has ate cereal for Breakfast, only fruit for lunch, and something like soup  for supper. Pt does not like ensure. Per recall pt has consumed < 75% of his estimated needs for > 1 week.  Pt meets criteria for moderate malnutrition in the context of acute illness with 5 % wt loss in one month and intake of < 75% of his needs in for > 1 week.  Labs:  CMP     Component Value Date/Time   NA 135 06/28/2011 0655   K 3.7 06/28/2011 0655   CL 97 06/28/2011 0655   CO2 29 06/28/2011 0655   GLUCOSE 108* 06/28/2011 0655   BUN 14 06/28/2011 0655   CREATININE 0.80 06/28/2011 0655   CALCIUM 9.0 06/28/2011 0655   PROT 6.9 05/23/2011 0645   ALBUMIN 3.7 05/23/2011 0645   AST 32 05/23/2011 0645   ALT 25 05/23/2011 0645   ALKPHOS 65 05/23/2011 0645   BILITOT 0.6 05/23/2011 0645   GFRNONAA 85* 06/28/2011 0655   GFRAA >90 06/28/2011 0655  CBG (last 3)  No results found for this basename: GLUCAP:3 in the last 72 hours   Intake/Output Summary (Last 24 hours) at 06/28/11 1620 Last data filed at 06/28/11 0926  Gross per 24 hour  Intake  840 ml  Output      0 ml  Net    840 ml   Diet Order: Regular  Supplements/Tube Feeding: none  IVF:    sodium chloride Last Rate: 100 mL/hr at 06/27/11 1932    Estimated Nutritional Needs:   Kcal:1800-2000 Protein: 90-100 grams Fluid: > 2 L/day  NUTRITION DIAGNOSIS: -Inadequate oral intake (NI-2.1).  Status: Ongoing  RELATED TO: poor appetite  AS EVIDENCE BY: 5% wt loss in one month  MONITORING/EVALUATION(Goals): Goal: Pt will consume > 90% of his estimated needs Monitor: po intake, weight  EDUCATION NEEDS: -Education needs addressed. Discussed importance of adequate nutrition with pt.   INTERVENTION:  [employer] BID  Dietitian [phone]  DOCUMENTATION CODES Per approved criteria  -Non-severe (moderate) malnutrition in the context of acute illness or injury    Ryan Coffey 06/28/2011, 4:15 PM

## 2011-06-28 NOTE — Progress Notes (Signed)
   CARE MANAGEMENT NOTE 06/28/2011  Patient:  Ryan Coffey, Ryan Coffey   Account Number:  [id number]  Date Initiated:  06/28/2011  Documentation initiated by:  Onnie Boer  Subjective/Objective Assessment:   PT WAS ADMITTED WITH PNA     Action/Plan:   PROGRESSION OF CARE AND DISCHARGE PLANNING   Anticipated DC Date:  07/01/2011   Anticipated DC Plan:  HOME W HOME HEALTH SERVICES      DC Planning Services  CM consult      Choice offered to / List presented to:             Status of service:  In process, will continue to follow Medicare Important Message given?   (If response is "NO", the following Medicare IM given date fields will be blank) Date Medicare IM given:   Date Additional Medicare IM given:    Discharge Disposition:    Per UR Regulation:  Reviewed for med. necessity/level of care/duration of stay  If discussed at Long Length of Stay Meetings, dates discussed:    Comments:  06/28/11 Onnie Boer, RN ,BSN 1649 PT WAS AT HOME PTA WITH HIS WIFE.  PT PLANS TO RETURN AT DC.  WILL F/U

## 2011-06-28 NOTE — Progress Notes (Signed)
Subjective: Patient seen and examined this am. Feels still having a lot of productive cough and sweating last night. tmax of 100.6. Not comfortable with the nebs as it made him cough a lot.  Objective:  Vital signs in last 24 hours:  Filed Vitals:   06/27/11 1902 06/27/11 2030 06/27/11 2101 06/28/11 0523  BP: 150/68  118/70 116/68  Pulse: 79  108 86  Temp: 98.8 F (37.1 C) 100.6 F (38.1 C) 99.7 F (37.6 C) 98.6 F (37 C)  TempSrc:  Oral Oral Oral  Resp: 20  18 20   Height:      Weight:      SpO2: 97%  92% 93%    Intake/Output from previous day:   Intake/Output Summary (Last 24 hours) at 06/28/11 0825 Last data filed at 06/27/11 1900  Gross per 24 hour  Intake    120 ml  Output      0 ml  Net    120 ml    Physical Exam:  General: elderly male  in no acute distress. HEENT: no pallor, no icterus, moist oral mucosa, no JVD, no cervical lymphadenopathy Heart: Normal  s1 &s2  Regular rate and rhythm, without murmurs, rubs, gallops. Lungs: equal air entry  Bilaterally, rhonchi over rt upper lung Abdomen: Soft, nontender, nondistended, positive bowel sounds. Extremities: No clubbing cyanosis or edema with positive pedal pulses. Neuro: Alert, awake, oriented x3, nonfocal.   Lab Results:  Basic Metabolic Panel:    Component Value Date/Time   NA 135 06/28/2011 0655   K 3.7 06/28/2011 0655   CL 97 06/28/2011 0655   CO2 29 06/28/2011 0655   BUN 14 06/28/2011 0655   CREATININE 0.80 06/28/2011 0655   GLUCOSE 108* 06/28/2011 0655   CALCIUM 9.0 06/28/2011 0655   CBC:    Component Value Date/Time   WBC 22.8* 06/28/2011 0655   HGB 11.5* 06/28/2011 0655   HCT 34.2* 06/28/2011 0655   PLT 438* 06/28/2011 0655   MCV 97.4 06/28/2011 0655   NEUTROABS 13.9* 05/23/2011 0645   LYMPHSABS 0.8 05/23/2011 0645   MONOABS 0.8 05/23/2011 0645   EOSABS 0.1 05/23/2011 0645   BASOSABS 0.0 05/23/2011 0645    No results found for this or any previous visit (from the past 240  hour(s)).  Studies/Results: No results found.  Medications: Scheduled Meds:   . benzonatate  100 mg Oral TID  . bromocriptine  2.5 mg Oral BID  . calcium-vitamin D  1 tablet Oral BID  . enoxaparin  40 mg Subcutaneous q1800  . folic acid  1 mg Oral Daily  . hydrocortisone  20 mg Oral QHS  . hydrocortisone  40 mg Oral Daily  . levofloxacin  750 mg Oral q1800  . levothyroxine  137 mcg Oral QAC breakfast  . methotrexate  12.5 mg Oral Custom  . mulitivitamin with minerals  1 tablet Oral Daily  . pantoprazole  40 mg Oral BID WC  . piperacillin-tazobactam (ZOSYN)  IV  3.375 g Intravenous Q8H  . simvastatin  40 mg Oral q1800  . sodium chloride  3 mL Intravenous Q12H  . testosterone cypionate  200 mg Intramuscular Q14 Days  . vancomycin  750 mg Intravenous Q12H  . vancomycin  1,000 mg Intravenous Once  . DISCONTD: albuterol  2.5 mg Nebulization Q6H  . DISCONTD: Calcium Carbonate-Vitamin D  1 tablet Oral BID  . DISCONTD: CENTRUM SILVER  1 tablet Oral q morning - 10a  . DISCONTD: CENTRUM SILVER  1 tablet Oral q  morning - 10a  . DISCONTD: cortisone  25 mg Oral Daily  . DISCONTD: cortisone  37.5 mg Oral Daily   Continuous Infusions:   . sodium chloride 100 mL/hr at 06/27/11 1932   PRN Meds:.sodium chloride, acetaminophen, acetaminophen, guaiFENesin-dextromethorphan, sodium chloride, traMADol Assessment/Plan  58 male with panhypopituitarism , RA, hx of tobacco use sent from PCP offcie for 5 days of fever , chills and productive cough secondary to RUL pneumonia.  *HCAP (healthcare-associated pneumonia)  Given recent hospital visit for gastroenteritis treating for  HCAP.  started empirically on IV vanco, zosyn and levaquin for atypical coverage.( day 2)  Check sputum cx and urine for legionella  Cont  robitussin for cough , d/c nebs Blood cx sent from PCP office and needs to be followed  Tylenol prn for fever  repeat cx if febrile  Leucocytosis of 25 k on admission , likely in the  setting of cortisone use which has been increased in dose recently as outpt  Will check an x ray PA lateral this am as pt still symptomatic  -will need CXR as outpt in 4 weeks to evaluate resolution of infiltrate   Active Problems:  Rheumatoid arthritis  Panhypopituitarism  Renal cell carcinoma  Degenerative joint disease  History of tobacco use  Hyperlipidemia  Stable, continue home meds  Diet: regular   DVT prophylaxis   full code   LOS: 1 day   Shadawn Hanaway 06/28/2011, 8:25 AM

## 2011-06-29 MED ORDER — ZOLPIDEM TARTRATE 5 MG PO TABS
5.0000 mg | ORAL_TABLET | Freq: Every evening | ORAL | Status: DC | PRN
  Administered 2011-06-29 – 2011-06-30 (×2): 5 mg via ORAL
  Filled 2011-06-29 (×2): qty 1

## 2011-06-29 MED ORDER — GUAIFENESIN-CODEINE 100-10 MG/5ML PO SOLN
10.0000 mL | ORAL | Status: DC | PRN
  Administered 2011-06-29 – 2011-07-01 (×11): 10 mL via ORAL
  Filled 2011-06-29: qty 5
  Filled 2011-06-29: qty 10
  Filled 2011-06-29: qty 5
  Filled 2011-06-29 (×5): qty 10
  Filled 2011-06-29: qty 5
  Filled 2011-06-29 (×3): qty 10
  Filled 2011-06-29: qty 5
  Filled 2011-06-29: qty 10

## 2011-06-29 NOTE — Progress Notes (Signed)
Subjective: Patient seen and examined this am. Felt better after adding codeine for cough, but again was troubled with persistent cough this am. T max of 100.4 overnight. Denies sweating or chills now.   Objective:  Vital signs in last 24 hours:  Filed Vitals:   06/28/11 0925 06/28/11 1325 06/28/11 2058 06/29/11 0519  BP: 159/80 116/73 116/77 115/69  Pulse: 96 108 84 89  Temp: 98.7 F (37.1 C) 100.4 F (38 C) 98.3 F (36.8 C) 98.9 F (37.2 C)  TempSrc:   Oral Oral  Resp: 20 20 20 18   Height:      Weight:      SpO2: 94% 93% 96% 92%    Intake/Output from previous day:   Intake/Output Summary (Last 24 hours) at 06/29/11 1108 Last data filed at 06/28/11 2103  Gross per 24 hour  Intake    120 ml  Output      0 ml  Net    120 ml    Physical Exam:  General: , in no acute distress. HEENT: no pallor, no icterus, moist oral mucosa, no JVD, no lymphadenopathy Heart: Normal  s1 &s2  Regular rate and rhythm, without murmurs, rubs, gallops. Lungs: Clear to auscultation bilaterally. ronchi over rt upper lung resolved Abdomen: Soft, nontender, nondistended, positive bowel sounds. Extremities: No clubbing cyanosis or edema with positive pedal pulses. Chronic Deformity over left hand Neuro: Alert, awake, oriented x3, nonfocal.   Lab Results:  Basic Metabolic Panel:    Component Value Date/Time   NA 135 06/28/2011 0655   K 3.7 06/28/2011 0655   CL 97 06/28/2011 0655   CO2 29 06/28/2011 0655   BUN 14 06/28/2011 0655   CREATININE 0.80 06/28/2011 0655   GLUCOSE 108* 06/28/2011 0655   CALCIUM 9.0 06/28/2011 0655   CBC:    Component Value Date/Time   WBC 22.8* 06/28/2011 0655   HGB 11.5* 06/28/2011 0655   HCT 34.2* 06/28/2011 0655   PLT 438* 06/28/2011 0655   MCV 97.4 06/28/2011 0655   NEUTROABS 13.9* 05/23/2011 0645   LYMPHSABS 0.8 05/23/2011 0645   MONOABS 0.8 05/23/2011 0645   EOSABS 0.1 05/23/2011 0645   BASOSABS 0.0 05/23/2011 0645    Recent Results (from the past 240 hour(s))    CULTURE, SPUTUM-ASSESSMENT     Status: Normal   Collection Time   06/28/11  4:28 PM      Component Value Range Status Comment   Specimen Description SPUTUM   Final    Special Requests Immunocompromised   Final    Sputum evaluation     Final    Value: THIS SPECIMEN IS ACCEPTABLE. RESPIRATORY CULTURE REPORT TO FOLLOW.   Report Status 06/28/2011 FINAL   Final   CULTURE, RESPIRATORY     Status: Normal (Preliminary result)   Collection Time   06/28/11  4:28 PM      Component Value Range Status Comment   Specimen Description SPUTUM   Final    Special Requests NONE   Final    Gram Stain     Final    Value: MODERATE WBC PRESENT,BOTH PMN AND MONONUCLEAR     NO SQUAMOUS EPITHELIAL CELLS SEEN     RARE GRAM POSITIVE COCCI IN PAIRS   Culture PENDING   Incomplete    Report Status PENDING   Incomplete     Studies/Results: Dg Chest 2 View  06/28/2011  *RADIOLOGY REPORT*  Clinical Data: Cough.  Probable pneumonia.  CHEST - 2 VIEW  Comparison: Chest x-ray 07/19/2009.  Findings: Compared to the prior examination there has been interval development of extensive air space consolidation throughout the right upper lobe, most pronounced posteriorly and inferiorly. There is some degree of volume loss as well, as evidenced by mild cephalad displacement of the horizontal fissure.  Lungs otherwise appear clear, but again demonstrate some degree of hyperexpansion and pruning of the pulmonary vasculature in the periphery, suggesting underlying COPD.  No definite pleural effusions. Prominence of the central pulmonary arteries again noted, suggestive of underlying pulmonary arterial hypertension.  No evidence of pulmonary edema.  Heart size is normal.  Mediastinal contours are unremarkable.  Atherosclerotic calcifications within the arch of the aorta.  Surgical clips projecting over the right upper quadrant of the abdomen, possibly from prior cholecystectomy.  IMPRESSION: 1.  Interval development of right upper lobe  airspace consolidation and a small amount of right upper lobe volume loss, indicative of right upper lobe pneumonia.  Follow-up radiographs until complete right resolution of these findings is highly recommended to exclude a centrally obstructing neoplasm. 2.  Background changes suggestive of COPD redemonstrated, as above. 3.  Prominence of the central pulmonary arteries again may be suggestive of pulmonary arterial hypertension. 4.  Atherosclerosis.  This was made call report.  Original Report Authenticated By: Florencia Reasons, M.D.    Medications: Scheduled Meds:   . benzonatate  100 mg Oral TID  . bromocriptine  2.5 mg Oral BID  . calcium-vitamin D  1 tablet Oral BID  . enoxaparin  40 mg Subcutaneous q1800  . folic acid  1 mg Oral Daily  . hydrocortisone  20 mg Oral QHS  . hydrocortisone  40 mg Oral Daily  . levofloxacin  750 mg Oral q1800  . levothyroxine  137 mcg Oral QAC breakfast  . methotrexate  12.5 mg Oral Custom  . mulitivitamin with minerals  1 tablet Oral Daily  . pantoprazole  40 mg Oral BID WC  . piperacillin-tazobactam (ZOSYN)  IV  3.375 g Intravenous Q8H  . simvastatin  40 mg Oral q1800  . sodium chloride  3 mL Intravenous Q12H  . testosterone cypionate  200 mg Intramuscular Q14 Days  . vancomycin  750 mg Intravenous Q12H   Continuous Infusions:   . DISCONTD: sodium chloride 100 mL/hr at 06/27/11 1932   PRN Meds:.sodium chloride, acetaminophen, acetaminophen, guaiFENesin-codeine, sodium chloride, traMADol, zolpidem, DISCONTD: guaiFENesin-codeine, DISCONTD: guaiFENesin-dextromethorphan   Assessment/Plan  77 male with panhypopituitarism , RA, hx of tobacco use sent from PCP offcie for 5 days of fever , chills and productive cough secondary to RUL pneumonia.   *HCAP (healthcare-associated pneumonia)  Given recent hospital visit for gastroenteritis treating for HCAP.  started empirically on IV vanco, zosyn and levaquin for atypical coverage.( day 3) Check sputum cx  growing and urine for legionella  Change cough meds to codeine and mucinex Blood cx sent from PCP office. Called the office today and was informed by the nurse that preliminary results show no growth Tylenol prn for fever  repeat cx if febrile  Leucocytosis of 25 k on admission , likely in the setting of cortisone use which has been increased in dose recently as outpt  CXR done here shows RUL consolidation and  COPD changes -will need CXR as outpt in 4 weeks to evaluate resolution of infiltrate   Active Problems:  Rheumatoid arthritis  Panhypopituitarism  Renal cell carcinoma  Degenerative joint disease  History of tobacco use  Hyperlipidemia   Stable, continue home meds  Diet: regular   DVT  prophylaxis   full code      LOS: 2 days   Emidio Warrell 06/29/2011, 11:08 AM

## 2011-06-30 LAB — STREP PNEUMONIAE ANTIBODY SEROTYPES
Strep pneumo Type 4: 0.2 ug/mL
Strep pneumo Type 9: 2.4 ug/mL
Strep pneumoniae Type 1 Abs: 0.25 ug/mL
Strep pneumoniae Type 3 Abs: 0.49 ug/mL
Strep pneumoniae Type 6B Abs: 2.89 ug/mL
Strep pneumoniae Type 7F Abs: 1.17 ug/mL
Strep pneumoniae Type 8 Abs: 1.84 ug/mL

## 2011-06-30 NOTE — Progress Notes (Signed)
Subjective: Feels better today and able to sleep overnight. Cough improved. Remains afebrile past 24 hrs  Objective:  Vital signs in last 24 hours:  Filed Vitals:   06/29/11 0519 06/29/11 1348 06/29/11 2225 06/30/11 0600  BP: 115/69 117/73 129/78 124/78  Pulse: 89 77 89 87  Temp: 98.9 F (37.2 C) 98.3 F (36.8 C) 98.5 F (36.9 C) 98.2 F (36.8 C)  TempSrc: Oral Oral Oral Oral  Resp: 18 20 18 18   Height:      Weight:      SpO2: 92% 91% 95% 94%    Intake/Output from previous day:   Intake/Output Summary (Last 24 hours) at 06/30/11 0917 Last data filed at 06/29/11 1700  Gross per 24 hour  Intake    600 ml  Output      0 ml  Net    600 ml    Physical Exam:  General: elderly male  in no acute distress.  HEENT: no pallor, no icterus, moist oral mucosa, no JVD, no lymphadenopathy  Heart: Normal s1 &s2 Regular rate and rhythm, without murmurs, rubs, gallops.  Lungs: Clear to auscultation bilaterally.no added sounds Abdomen: Soft, nontender, nondistended, positive bowel sounds.  Extremities: No clubbing cyanosis or edema with positive pedal pulses. Chronic Deformity over left hand  Neuro: Alert, awake, oriented x3, nonfocal.    Lab Results:  Basic Metabolic Panel:    Component Value Date/Time   NA 135 06/28/2011 0655   K 3.7 06/28/2011 0655   CL 97 06/28/2011 0655   CO2 29 06/28/2011 0655   BUN 14 06/28/2011 0655   CREATININE 0.80 06/28/2011 0655   GLUCOSE 108* 06/28/2011 0655   CALCIUM 9.0 06/28/2011 0655   CBC:    Component Value Date/Time   WBC 22.8* 06/28/2011 0655   HGB 11.5* 06/28/2011 0655   HCT 34.2* 06/28/2011 0655   PLT 438* 06/28/2011 0655   MCV 97.4 06/28/2011 0655   NEUTROABS 13.9* 05/23/2011 0645   LYMPHSABS 0.8 05/23/2011 0645   MONOABS 0.8 05/23/2011 0645   EOSABS 0.1 05/23/2011 0645   BASOSABS 0.0 05/23/2011 0645    Recent Results (from the past 240 hour(s))  CULTURE, SPUTUM-ASSESSMENT     Status: Normal   Collection Time   06/28/11  4:28 PM   Component Value Range Status Comment   Specimen Description SPUTUM   Final    Special Requests Immunocompromised   Final    Sputum evaluation     Final    Value: THIS SPECIMEN IS ACCEPTABLE. RESPIRATORY CULTURE REPORT TO FOLLOW.   Report Status 06/28/2011 FINAL   Final   CULTURE, RESPIRATORY     Status: Normal (Preliminary result)   Collection Time   06/28/11  4:28 PM      Component Value Range Status Comment   Specimen Description SPUTUM   Final    Special Requests NONE   Final    Gram Stain     Final    Value: MODERATE WBC PRESENT,BOTH PMN AND MONONUCLEAR     NO SQUAMOUS EPITHELIAL CELLS SEEN     RARE GRAM POSITIVE COCCI IN PAIRS   Culture PENDING   Incomplete    Report Status PENDING   Incomplete     Studies/Results: Dg Chest 2 View  06/28/2011  *RADIOLOGY REPORT*  Clinical Data: Cough.  Probable pneumonia.  CHEST - 2 VIEW  Comparison: Chest x-ray 07/19/2009.  Findings: Compared to the prior examination there has been interval development of extensive air space consolidation throughout the right upper  lobe, most pronounced posteriorly and inferiorly. There is some degree of volume loss as well, as evidenced by mild cephalad displacement of the horizontal fissure.  Lungs otherwise appear clear, but again demonstrate some degree of hyperexpansion and pruning of the pulmonary vasculature in the periphery, suggesting underlying COPD.  No definite pleural effusions. Prominence of the central pulmonary arteries again noted, suggestive of underlying pulmonary arterial hypertension.  No evidence of pulmonary edema.  Heart size is normal.  Mediastinal contours are unremarkable.  Atherosclerotic calcifications within the arch of the aorta.  Surgical clips projecting over the right upper quadrant of the abdomen, possibly from prior cholecystectomy.  IMPRESSION: 1.  Interval development of right upper lobe airspace consolidation and a small amount of right upper lobe volume loss, indicative of right  upper lobe pneumonia.  Follow-up radiographs until complete right resolution of these findings is highly recommended to exclude a centrally obstructing neoplasm. 2.  Background changes suggestive of COPD redemonstrated, as above. 3.  Prominence of the central pulmonary arteries again may be suggestive of pulmonary arterial hypertension. 4.  Atherosclerosis.  This was made call report.  Original Report Authenticated By: Florencia Reasons, M.D.    Medications: Scheduled Meds:   . benzonatate  100 mg Oral TID  . bromocriptine  2.5 mg Oral BID  . calcium-vitamin D  1 tablet Oral BID  . enoxaparin  40 mg Subcutaneous q1800  . folic acid  1 mg Oral Daily  . hydrocortisone  20 mg Oral QHS  . hydrocortisone  40 mg Oral Daily  . levofloxacin  750 mg Oral q1800  . levothyroxine  137 mcg Oral QAC breakfast  . methotrexate  12.5 mg Oral Custom  . mulitivitamin with minerals  1 tablet Oral Daily  . pantoprazole  40 mg Oral BID WC  . piperacillin-tazobactam (ZOSYN)  IV  3.375 g Intravenous Q8H  . simvastatin  40 mg Oral q1800  . sodium chloride  3 mL Intravenous Q12H  . testosterone cypionate  200 mg Intramuscular Q14 Days  . vancomycin  750 mg Intravenous Q12H   Continuous Infusions:  PRN Meds:.sodium chloride, acetaminophen, acetaminophen, guaiFENesin-codeine, sodium chloride, traMADol, zolpidem  Assessment/Plan  74 male with panhypopituitarism , RA, hx of tobacco use sent from PCP offcie for 5 days of fever , chills and productive cough secondary to RUL pneumonia.   *HCAP (healthcare-associated pneumonia)  Given recent hospital visit for gastroenteritis treating for HCAP.  started empirically on IV vanco, zosyn and levaquin for atypical coverage.( day 4) , prelim sputum cx growing rare GPC  and urine for legionella pending Changed cough meds to codeine and mucinex  Blood cx sent from PCP office. Called the office ton 4/26 and was informed by the nurse that preliminary results show no growth    Tylenol prn for fever  repeat cx if febrile  Leucocytosis of 25 k on admission , likely in the setting of cortisone use which has been increased in dose recently as outpt  CXR done here shows RUL consolidation and COPD changes  -will need CXR as outpt in 4 weeks to evaluate resolution of infiltrate   Active Problems:  Rheumatoid arthritis  Panhypopituitarism  Renal cell carcinoma  Degenerative joint disease  History of tobacco use  Hyperlipidemia  Stable, continue home meds  Diet: regular  DVT prophylaxis  full code   Patient clinically improving. If remains afebrile today , he can be discharged in am with po abx to complete a 7 day course  LOS: 3 days   Pj Zehner 06/30/2011, 9:17 AM

## 2011-07-01 LAB — CULTURE, RESPIRATORY W GRAM STAIN: Culture: NORMAL

## 2011-07-01 MED ORDER — ZOLPIDEM TARTRATE 5 MG PO TABS: 5.0000 mg | ORAL_TABLET | Freq: Every evening | ORAL | Status: DC | PRN

## 2011-07-01 MED ORDER — LEVOFLOXACIN 750 MG PO TABS: 750.0000 mg | ORAL_TABLET | Freq: Every day | ORAL | Status: AC

## 2011-07-01 MED ORDER — GUAIFENESIN-CODEINE 100-10 MG/5ML PO SOLN: 10.0000 mL | Freq: Four times a day (QID) | ORAL | Status: AC | PRN

## 2011-07-01 NOTE — Discharge Summary (Signed)
Patient ID: Ryan Coffey MRN: [id number] DOB/AGE: 07-Oct-1932 78 y.o.  Admit date: 06/27/2011 Discharge date: 07/01/2011  Primary Care Physician:  Hoyle Sauer, MD, MD  Discharge Diagnoses:     Principal Problem:  *HCAP (healthcare-associated pneumonia)  Active Problems:  Rheumatoid arthritis  Panhypopituitarism  Renal cell carcinoma  Degenerative joint disease  History of tobacco use  Hyperlipidemia   Medication List  As of 07/01/2011  7:51 AM   TAKE these medications         bromocriptine 2.5 MG tablet   Commonly known as: PARLODEL   Take 2.5 mg by mouth 2 (two) times daily.      Calcium 500 + D 500-125 MG-UNIT Tabs   Generic drug: Calcium Carbonate-Vitamin D   Take 1 tablet by mouth 2 (two) times daily.      CENTRUM SILVER PO   Take by mouth.      folic acid 1 MG tablet   Commonly known as: FOLVITE   Take 1 mg by mouth daily.      guaiFENesin-codeine 100-10 MG/5ML syrup   Take 10 mLs by mouth 4 (four) times daily as needed for cough or congestion.      levofloxacin 750 MG tablet   Commonly known as: LEVAQUIN   Take 1 tablet (750 mg total) by mouth daily at 6 PM.      levothyroxine 137 MCG tablet   Commonly known as: SYNTHROID, LEVOTHROID   Take 137 mcg by mouth daily.      methotrexate 2.5 MG tablet   Commonly known as: RHEUMATREX   Take 12.5 mg by mouth See admin instructions. Caution:Chemotherapy. Protect from light.  Pt takes 5 tabs of 2.5mg  for a total dose of 12.5mg  on Saturday and Sunday. Pt takes a full dose of 25 mg per week.      pantoprazole 40 MG tablet   Commonly known as: PROTONIX   Take 40 mg by mouth 2 (two) times daily.      pravastatin 80 MG tablet   Commonly known as: PRAVACHOL   Take 80 mg by mouth daily.      testosterone cypionate 100 MG/ML injection   Commonly known as: DEPOTESTOTERONE CYPIONATE   Inject 200 mg into the muscle every 14 (fourteen) days. For IM use only      vardenafil 10 MG tablet   Commonly known as:  LEVITRA   Take 10 mg by mouth daily as needed.      zolpidem 5 MG tablet   Commonly known as: AMBIEN   Take 1 tablet (5 mg total) by mouth at bedtime as needed for sleep.         ASK your doctor about these medications         cortisone 25 MG tablet   Commonly known as: CORTONE   Take 20-40 mg by mouth 2 (two) times daily. 2 tablets in the morning and 1 at night            Disposition and Follow-up:  Home with outpt PCP follow up. Needs repeat CXR in 4 weeks to evaluate resolution of pneumonia  Consults:  none  Significant Diagnostic Studies:  Dg Chest 2 View  06/28/2011  *RADIOLOGY REPORT*  Clinical Data: Cough.  Probable pneumonia.  CHEST - 2 VIEW  Comparison: Chest x-ray 07/19/2009.  Findings: Compared to the prior examination there has been interval development of extensive air space consolidation throughout the right upper lobe, most pronounced posteriorly and inferiorly. There is some degree of  volume loss as well, as evidenced by mild cephalad displacement of the horizontal fissure.  Lungs otherwise appear clear, but again demonstrate some degree of hyperexpansion and pruning of the pulmonary vasculature in the periphery, suggesting underlying COPD.  No definite pleural effusions. Prominence of the central pulmonary arteries again noted, suggestive of underlying pulmonary arterial hypertension.  No evidence of pulmonary edema.  Heart size is normal.  Mediastinal contours are unremarkable.  Atherosclerotic calcifications within the arch of the aorta.  Surgical clips projecting over the right upper quadrant of the abdomen, possibly from prior cholecystectomy.  IMPRESSION: 1.  Interval development of right upper lobe airspace consolidation and a small amount of right upper lobe volume loss, indicative of right upper lobe pneumonia.  Follow-up radiographs until complete right resolution of these findings is highly recommended to exclude a centrally obstructing neoplasm. 2.  Background  changes suggestive of COPD redemonstrated, as above. 3.  Prominence of the central pulmonary arteries again may be suggestive of pulmonary arterial hypertension. 4.  Atherosclerosis.  This was made call report.  Original Report Authenticated By: Florencia Reasons, M.D.    Brief H and P: For complete details please refer to admission H and P, but in brief 78 y/opleasent male with hx of panhypopituitarism Secondary to pituitary adenoma, rheumatoid arthritis, renal cell ca ( bx done in 09 and not on any treatment), DJD, hx of tobacco use in past was seen at PCP office today for persistent productive cough with chills , sweating and fever at home for 5 days. He informs having a viral gastroenteritis 1 month back that made him go to [facility] and given IV fluids for the whole day in ED. He hadnot fully recovered from this viral illness and felt very weak with loss of appetite and almost lost 10 pounds over past 1 month. His PCP increased his dose of cortisone with plan on taper over next few weeks. patient had started to feel better but then started having these symptoms. He went to his PCP and was noted to have temp of 103. Blood cx was taken and CXR was done showing RUL pneumonia and requested for admission to hospital. patient informs of increase sweating with feeling hot and green productive sputum for past 5 days. Denies sick contact except or recent ED visit and prior to that visited hospital to see a sick friend. Denies recent travel. Denies nausea, vomiting, headache, chest pain but did have some DOE  and cough. At baseline he actively works out and plays tennis. Denies bowel or urinary symptoms.   Physical Exam on Discharge:  Filed Vitals:   06/30/11 0600 06/30/11 1257 06/30/11 2142 07/01/11 0609  BP: 124/78 136/79 128/75 150/74  Pulse: 87 104 88 112  Temp: 98.2 F (36.8 C) 97.9 F (36.6 C) 98.4 F (36.9 C) 98.4 F (36.9 C)  TempSrc: Oral Oral Oral Oral  Resp: 18 19 20 20   Height:        Weight:      SpO2: 94% 90% 94% 92%    No intake or output data in the 24 hours ending 07/01/11 0751  General: elderly male in no acute distress.  HEENT: no pallor, no icterus, moist oral mucosa, no JVD, no lymphadenopathy  Heart: Normal s1 &s2 Regular rate and rhythm, without murmurs, rubs, gallops.  Lungs: Clear to auscultation bilaterally.no added sounds  Abdomen: Soft, nontender, nondistended, positive bowel sounds.  Extremities: No clubbing cyanosis or edema with positive pedal pulses. Chronic Deformity over left  hand  Neuro: Alert, awake, oriented x3, nonfocal.   CBC:    Component Value Date/Time   WBC 22.8* 06/28/2011 0655   HGB 11.5* 06/28/2011 0655   HCT 34.2* 06/28/2011 0655   PLT 438* 06/28/2011 0655   MCV 97.4 06/28/2011 0655   NEUTROABS 13.9* 05/23/2011 0645   LYMPHSABS 0.8 05/23/2011 0645   MONOABS 0.8 05/23/2011 0645   EOSABS 0.1 05/23/2011 0645   BASOSABS 0.0 05/23/2011 0645    Basic Metabolic Panel:    Component Value Date/Time   NA 135 06/28/2011 0655   K 3.7 06/28/2011 0655   CL 97 06/28/2011 0655   CO2 29 06/28/2011 0655   BUN 14 06/28/2011 0655   CREATININE 0.80 06/28/2011 0655   GLUCOSE 108* 06/28/2011 0655   CALCIUM 9.0 06/28/2011 0655    Hospital Course:  *HCAP (healthcare-associated pneumonia)  -Given recent hospital visit for gastroenteritis patient treated for  HCAP. Also appeared dehydrated on presentation and was adequately hydrated with IV fluids -started empirically on IV vanco, zosyn and levaquin for atypical coverage., prelim sputum cx growing rare GPC  -Changed cough meds to codeine and mucinex  -Blood cx sent from PCP office. Called the office on 4/26 and was informed by the nurse that preliminary results show no growth  -Leucocytosis of 25 k on admission , likely in the setting of cortisone use which has been increased in dose recently as outpt  CXR done here shows RUL consolidation and COPD changes  -will need CXR as outpt in 4 weeks to evaluate  resolution of infiltrate  Patient remains clinically stable, afebrile for past 48 hrs and his cough has almost completely resolved. He will be discharged on 3 more days of po levaquin to complete a total 7 days antibiotic course.   His remaining medical issues are stable  Discharge plan and outpt follow up discussed with patient and his wife   Time spent on Discharge: 45 minutes  Signed: Eddie North 07/01/2011, 7:51 AM

## 2011-07-24 ENCOUNTER — Ambulatory Visit
Admission: RE | Admit: 2011-07-24 | Discharge: 2011-07-24 | Disposition: A | Discharge status: Home / Self Care | Payer: Medicare Other | Source: Ambulatory Visit | Attending: Internal Medicine | Admitting: Internal Medicine

## 2011-07-24 ENCOUNTER — Other Ambulatory Visit: Payer: Self-pay | Admitting: Internal Medicine

## 2011-07-24 DIAGNOSIS — J189 Pneumonia, unspecified organism: Secondary | ICD-10-CM

## 2011-07-24 DIAGNOSIS — M069 Rheumatoid arthritis, unspecified: Secondary | ICD-10-CM | POA: Diagnosis not present

## 2011-07-24 DIAGNOSIS — E230 Hypopituitarism: Secondary | ICD-10-CM | POA: Diagnosis not present

## 2011-07-24 DIAGNOSIS — J9819 Other pulmonary collapse: Secondary | ICD-10-CM | POA: Diagnosis not present

## 2011-07-24 DIAGNOSIS — C649 Malignant neoplasm of unspecified kidney, except renal pelvis: Secondary | ICD-10-CM | POA: Diagnosis not present

## 2011-07-24 MED ORDER — IOHEXOL 300 MG/ML  SOLN
75.0000 mL | Freq: Once | INTRAMUSCULAR | Status: AC | PRN
  Administered 2011-07-24: 75 mL via INTRAVENOUS

## 2011-07-31 DIAGNOSIS — J189 Pneumonia, unspecified organism: Secondary | ICD-10-CM | POA: Diagnosis not present

## 2011-07-31 DIAGNOSIS — E230 Hypopituitarism: Secondary | ICD-10-CM | POA: Diagnosis not present

## 2011-08-06 DIAGNOSIS — L570 Actinic keratosis: Secondary | ICD-10-CM | POA: Diagnosis not present

## 2011-11-26 DIAGNOSIS — E785 Hyperlipidemia, unspecified: Secondary | ICD-10-CM | POA: Diagnosis not present

## 2011-11-26 DIAGNOSIS — E230 Hypopituitarism: Secondary | ICD-10-CM | POA: Diagnosis not present

## 2011-11-26 DIAGNOSIS — E039 Hypothyroidism, unspecified: Secondary | ICD-10-CM | POA: Diagnosis not present

## 2011-11-26 DIAGNOSIS — Z23 Encounter for immunization: Secondary | ICD-10-CM | POA: Diagnosis not present

## 2011-12-19 DIAGNOSIS — Z79899 Other long term (current) drug therapy: Secondary | ICD-10-CM | POA: Diagnosis not present

## 2011-12-19 DIAGNOSIS — M069 Rheumatoid arthritis, unspecified: Secondary | ICD-10-CM | POA: Diagnosis not present

## 2012-02-11 DIAGNOSIS — L570 Actinic keratosis: Secondary | ICD-10-CM | POA: Diagnosis not present

## 2012-02-12 DIAGNOSIS — E785 Hyperlipidemia, unspecified: Secondary | ICD-10-CM | POA: Diagnosis not present

## 2012-02-12 DIAGNOSIS — Z125 Encounter for screening for malignant neoplasm of prostate: Secondary | ICD-10-CM | POA: Diagnosis not present

## 2012-02-12 DIAGNOSIS — M810 Age-related osteoporosis without current pathological fracture: Secondary | ICD-10-CM | POA: Diagnosis not present

## 2012-02-12 DIAGNOSIS — E039 Hypothyroidism, unspecified: Secondary | ICD-10-CM | POA: Diagnosis not present

## 2012-02-13 DIAGNOSIS — N200 Calculus of kidney: Secondary | ICD-10-CM | POA: Diagnosis not present

## 2012-02-13 DIAGNOSIS — C649 Malignant neoplasm of unspecified kidney, except renal pelvis: Secondary | ICD-10-CM | POA: Diagnosis not present

## 2012-02-13 DIAGNOSIS — J438 Other emphysema: Secondary | ICD-10-CM | POA: Diagnosis not present

## 2012-02-19 DIAGNOSIS — C649 Malignant neoplasm of unspecified kidney, except renal pelvis: Secondary | ICD-10-CM | POA: Diagnosis not present

## 2012-02-22 DIAGNOSIS — J189 Pneumonia, unspecified organism: Secondary | ICD-10-CM | POA: Diagnosis not present

## 2012-02-22 DIAGNOSIS — K279 Peptic ulcer, site unspecified, unspecified as acute or chronic, without hemorrhage or perforation: Secondary | ICD-10-CM | POA: Diagnosis not present

## 2012-02-22 DIAGNOSIS — Z0000 Encounter for general adult medical examination without abnormal findings: Secondary | ICD-10-CM | POA: Diagnosis not present

## 2012-02-22 DIAGNOSIS — E230 Hypopituitarism: Secondary | ICD-10-CM | POA: Diagnosis not present

## 2012-03-03 DIAGNOSIS — E039 Hypothyroidism, unspecified: Secondary | ICD-10-CM | POA: Diagnosis not present

## 2012-03-03 DIAGNOSIS — E2749 Other adrenocortical insufficiency: Secondary | ICD-10-CM | POA: Diagnosis not present

## 2012-03-03 DIAGNOSIS — E230 Hypopituitarism: Secondary | ICD-10-CM | POA: Diagnosis not present

## 2012-03-12 DIAGNOSIS — Z1212 Encounter for screening for malignant neoplasm of rectum: Secondary | ICD-10-CM | POA: Diagnosis not present

## 2012-06-10 DIAGNOSIS — Z79899 Other long term (current) drug therapy: Secondary | ICD-10-CM | POA: Diagnosis not present

## 2012-06-10 DIAGNOSIS — M069 Rheumatoid arthritis, unspecified: Secondary | ICD-10-CM | POA: Diagnosis not present

## 2012-08-11 DIAGNOSIS — L570 Actinic keratosis: Secondary | ICD-10-CM | POA: Diagnosis not present

## 2012-09-09 DIAGNOSIS — R05 Cough: Secondary | ICD-10-CM | POA: Diagnosis not present

## 2012-09-09 DIAGNOSIS — J069 Acute upper respiratory infection, unspecified: Secondary | ICD-10-CM | POA: Diagnosis not present

## 2012-09-12 DIAGNOSIS — J069 Acute upper respiratory infection, unspecified: Secondary | ICD-10-CM | POA: Diagnosis not present

## 2012-09-12 DIAGNOSIS — J449 Chronic obstructive pulmonary disease, unspecified: Secondary | ICD-10-CM | POA: Diagnosis not present

## 2012-09-12 DIAGNOSIS — R5381 Other malaise: Secondary | ICD-10-CM | POA: Diagnosis not present

## 2012-09-12 DIAGNOSIS — IMO0002 Reserved for concepts with insufficient information to code with codable children: Secondary | ICD-10-CM | POA: Diagnosis not present

## 2012-09-12 DIAGNOSIS — E2749 Other adrenocortical insufficiency: Secondary | ICD-10-CM | POA: Diagnosis not present

## 2012-09-22 DIAGNOSIS — Z6825 Body mass index (BMI) 25.0-25.9, adult: Secondary | ICD-10-CM | POA: Diagnosis not present

## 2012-09-22 DIAGNOSIS — L570 Actinic keratosis: Secondary | ICD-10-CM | POA: Diagnosis not present

## 2012-09-22 DIAGNOSIS — J449 Chronic obstructive pulmonary disease, unspecified: Secondary | ICD-10-CM | POA: Diagnosis not present

## 2012-09-22 DIAGNOSIS — A689 Relapsing fever, unspecified: Secondary | ICD-10-CM | POA: Diagnosis not present

## 2012-09-22 DIAGNOSIS — M069 Rheumatoid arthritis, unspecified: Secondary | ICD-10-CM | POA: Diagnosis not present

## 2012-09-22 DIAGNOSIS — E2749 Other adrenocortical insufficiency: Secondary | ICD-10-CM | POA: Diagnosis not present

## 2012-09-22 DIAGNOSIS — Z1331 Encounter for screening for depression: Secondary | ICD-10-CM | POA: Diagnosis not present

## 2012-12-08 DIAGNOSIS — Z79899 Other long term (current) drug therapy: Secondary | ICD-10-CM | POA: Diagnosis not present

## 2012-12-08 DIAGNOSIS — M069 Rheumatoid arthritis, unspecified: Secondary | ICD-10-CM | POA: Diagnosis not present

## 2013-01-05 DIAGNOSIS — M069 Rheumatoid arthritis, unspecified: Secondary | ICD-10-CM | POA: Diagnosis not present

## 2013-01-05 DIAGNOSIS — Z79899 Other long term (current) drug therapy: Secondary | ICD-10-CM | POA: Diagnosis not present

## 2013-02-09 ENCOUNTER — Ambulatory Visit (HOSPITAL_COMMUNITY)
Admission: RE | Admit: 2013-02-09 | Discharge: 2013-02-09 | Disposition: A | Discharge status: Home / Self Care | Payer: Medicare Other | Source: Ambulatory Visit | Attending: Urology | Admitting: Urology

## 2013-02-09 ENCOUNTER — Other Ambulatory Visit (HOSPITAL_COMMUNITY): Payer: Self-pay | Admitting: Urology

## 2013-02-09 DIAGNOSIS — J4489 Other specified chronic obstructive pulmonary disease: Secondary | ICD-10-CM | POA: Insufficient documentation

## 2013-02-09 DIAGNOSIS — C649 Malignant neoplasm of unspecified kidney, except renal pelvis: Secondary | ICD-10-CM

## 2013-02-09 DIAGNOSIS — R05 Cough: Secondary | ICD-10-CM | POA: Diagnosis not present

## 2013-02-09 DIAGNOSIS — A689 Relapsing fever, unspecified: Secondary | ICD-10-CM | POA: Diagnosis not present

## 2013-02-09 DIAGNOSIS — Z6825 Body mass index (BMI) 25.0-25.9, adult: Secondary | ICD-10-CM | POA: Diagnosis not present

## 2013-02-09 DIAGNOSIS — E230 Hypopituitarism: Secondary | ICD-10-CM | POA: Diagnosis not present

## 2013-02-09 DIAGNOSIS — J449 Chronic obstructive pulmonary disease, unspecified: Secondary | ICD-10-CM | POA: Diagnosis not present

## 2013-02-13 DIAGNOSIS — H6120 Impacted cerumen, unspecified ear: Secondary | ICD-10-CM | POA: Diagnosis not present

## 2013-02-13 DIAGNOSIS — H9209 Otalgia, unspecified ear: Secondary | ICD-10-CM | POA: Diagnosis not present

## 2013-02-20 DIAGNOSIS — C649 Malignant neoplasm of unspecified kidney, except renal pelvis: Secondary | ICD-10-CM | POA: Diagnosis not present

## 2013-03-26 DIAGNOSIS — L578 Other skin changes due to chronic exposure to nonionizing radiation: Secondary | ICD-10-CM | POA: Diagnosis not present

## 2013-03-26 DIAGNOSIS — L570 Actinic keratosis: Secondary | ICD-10-CM | POA: Diagnosis not present

## 2013-04-15 DIAGNOSIS — E785 Hyperlipidemia, unspecified: Secondary | ICD-10-CM | POA: Diagnosis not present

## 2013-04-15 DIAGNOSIS — E039 Hypothyroidism, unspecified: Secondary | ICD-10-CM | POA: Diagnosis not present

## 2013-04-15 DIAGNOSIS — Z125 Encounter for screening for malignant neoplasm of prostate: Secondary | ICD-10-CM | POA: Diagnosis not present

## 2013-04-15 DIAGNOSIS — M949 Disorder of cartilage, unspecified: Secondary | ICD-10-CM | POA: Diagnosis not present

## 2013-04-15 DIAGNOSIS — M899 Disorder of bone, unspecified: Secondary | ICD-10-CM | POA: Diagnosis not present

## 2013-04-15 DIAGNOSIS — R809 Proteinuria, unspecified: Secondary | ICD-10-CM | POA: Diagnosis not present

## 2013-04-29 DIAGNOSIS — L578 Other skin changes due to chronic exposure to nonionizing radiation: Secondary | ICD-10-CM | POA: Diagnosis not present

## 2013-05-12 DIAGNOSIS — N200 Calculus of kidney: Secondary | ICD-10-CM | POA: Diagnosis not present

## 2013-05-12 DIAGNOSIS — K279 Peptic ulcer, site unspecified, unspecified as acute or chronic, without hemorrhage or perforation: Secondary | ICD-10-CM | POA: Diagnosis not present

## 2013-05-12 DIAGNOSIS — E291 Testicular hypofunction: Secondary | ICD-10-CM | POA: Diagnosis not present

## 2013-05-12 DIAGNOSIS — M069 Rheumatoid arthritis, unspecified: Secondary | ICD-10-CM | POA: Diagnosis not present

## 2013-05-12 DIAGNOSIS — M810 Age-related osteoporosis without current pathological fracture: Secondary | ICD-10-CM | POA: Diagnosis not present

## 2013-05-12 DIAGNOSIS — E039 Hypothyroidism, unspecified: Secondary | ICD-10-CM | POA: Diagnosis not present

## 2013-05-12 DIAGNOSIS — E230 Hypopituitarism: Secondary | ICD-10-CM | POA: Diagnosis not present

## 2013-05-12 DIAGNOSIS — Z0000 Encounter for general adult medical examination without abnormal findings: Secondary | ICD-10-CM | POA: Diagnosis not present

## 2013-05-19 DIAGNOSIS — Z1212 Encounter for screening for malignant neoplasm of rectum: Secondary | ICD-10-CM | POA: Diagnosis not present

## 2013-06-08 DIAGNOSIS — M069 Rheumatoid arthritis, unspecified: Secondary | ICD-10-CM | POA: Diagnosis not present

## 2013-06-08 DIAGNOSIS — Z79899 Other long term (current) drug therapy: Secondary | ICD-10-CM | POA: Diagnosis not present

## 2013-07-29 DIAGNOSIS — Z79899 Other long term (current) drug therapy: Secondary | ICD-10-CM | POA: Diagnosis not present

## 2013-07-29 DIAGNOSIS — M069 Rheumatoid arthritis, unspecified: Secondary | ICD-10-CM | POA: Diagnosis not present

## 2013-08-26 DIAGNOSIS — L570 Actinic keratosis: Secondary | ICD-10-CM | POA: Diagnosis not present

## 2013-11-16 DIAGNOSIS — Z1331 Encounter for screening for depression: Secondary | ICD-10-CM | POA: Diagnosis not present

## 2013-11-16 DIAGNOSIS — M069 Rheumatoid arthritis, unspecified: Secondary | ICD-10-CM | POA: Diagnosis not present

## 2013-11-16 DIAGNOSIS — E2749 Other adrenocortical insufficiency: Secondary | ICD-10-CM | POA: Diagnosis not present

## 2013-11-16 DIAGNOSIS — E785 Hyperlipidemia, unspecified: Secondary | ICD-10-CM | POA: Diagnosis not present

## 2013-11-16 DIAGNOSIS — E291 Testicular hypofunction: Secondary | ICD-10-CM | POA: Diagnosis not present

## 2013-11-16 DIAGNOSIS — E039 Hypothyroidism, unspecified: Secondary | ICD-10-CM | POA: Diagnosis not present

## 2013-11-16 DIAGNOSIS — J069 Acute upper respiratory infection, unspecified: Secondary | ICD-10-CM | POA: Diagnosis not present

## 2013-12-07 DIAGNOSIS — M7989 Other specified soft tissue disorders: Secondary | ICD-10-CM | POA: Diagnosis not present

## 2013-12-07 DIAGNOSIS — M069 Rheumatoid arthritis, unspecified: Secondary | ICD-10-CM | POA: Diagnosis not present

## 2013-12-07 DIAGNOSIS — Z7982 Long term (current) use of aspirin: Secondary | ICD-10-CM | POA: Diagnosis not present

## 2013-12-23 DIAGNOSIS — L219 Seborrheic dermatitis, unspecified: Secondary | ICD-10-CM | POA: Diagnosis not present

## 2013-12-23 DIAGNOSIS — L570 Actinic keratosis: Secondary | ICD-10-CM | POA: Diagnosis not present

## 2014-02-17 DIAGNOSIS — L570 Actinic keratosis: Secondary | ICD-10-CM | POA: Diagnosis not present

## 2014-02-22 DIAGNOSIS — Z23 Encounter for immunization: Secondary | ICD-10-CM | POA: Diagnosis not present

## 2014-03-02 DIAGNOSIS — Z7952 Long term (current) use of systemic steroids: Secondary | ICD-10-CM | POA: Diagnosis not present

## 2014-03-02 DIAGNOSIS — M069 Rheumatoid arthritis, unspecified: Secondary | ICD-10-CM | POA: Diagnosis not present

## 2014-05-26 DIAGNOSIS — Z125 Encounter for screening for malignant neoplasm of prostate: Secondary | ICD-10-CM | POA: Diagnosis not present

## 2014-05-26 DIAGNOSIS — E039 Hypothyroidism, unspecified: Secondary | ICD-10-CM | POA: Diagnosis not present

## 2014-05-26 DIAGNOSIS — E291 Testicular hypofunction: Secondary | ICD-10-CM | POA: Diagnosis not present

## 2014-05-26 DIAGNOSIS — E230 Hypopituitarism: Secondary | ICD-10-CM | POA: Diagnosis not present

## 2014-05-26 DIAGNOSIS — Z008 Encounter for other general examination: Secondary | ICD-10-CM | POA: Diagnosis not present

## 2014-05-26 DIAGNOSIS — E785 Hyperlipidemia, unspecified: Secondary | ICD-10-CM | POA: Diagnosis not present

## 2014-06-01 DIAGNOSIS — J449 Chronic obstructive pulmonary disease, unspecified: Secondary | ICD-10-CM | POA: Diagnosis not present

## 2014-06-01 DIAGNOSIS — M810 Age-related osteoporosis without current pathological fracture: Secondary | ICD-10-CM | POA: Diagnosis not present

## 2014-06-01 DIAGNOSIS — K279 Peptic ulcer, site unspecified, unspecified as acute or chronic, without hemorrhage or perforation: Secondary | ICD-10-CM | POA: Diagnosis not present

## 2014-06-01 DIAGNOSIS — N200 Calculus of kidney: Secondary | ICD-10-CM | POA: Diagnosis not present

## 2014-06-01 DIAGNOSIS — E230 Hypopituitarism: Secondary | ICD-10-CM | POA: Diagnosis not present

## 2014-06-01 DIAGNOSIS — Z6823 Body mass index (BMI) 23.0-23.9, adult: Secondary | ICD-10-CM | POA: Diagnosis not present

## 2014-06-01 DIAGNOSIS — E278 Other specified disorders of adrenal gland: Secondary | ICD-10-CM | POA: Diagnosis not present

## 2014-06-01 DIAGNOSIS — Z0000 Encounter for general adult medical examination without abnormal findings: Secondary | ICD-10-CM | POA: Diagnosis not present

## 2014-06-01 DIAGNOSIS — Z23 Encounter for immunization: Secondary | ICD-10-CM | POA: Diagnosis not present

## 2014-06-01 DIAGNOSIS — Z1389 Encounter for screening for other disorder: Secondary | ICD-10-CM | POA: Diagnosis not present

## 2014-06-01 DIAGNOSIS — M069 Rheumatoid arthritis, unspecified: Secondary | ICD-10-CM | POA: Diagnosis not present

## 2014-06-01 DIAGNOSIS — E291 Testicular hypofunction: Secondary | ICD-10-CM | POA: Diagnosis not present

## 2014-06-02 DIAGNOSIS — Z1212 Encounter for screening for malignant neoplasm of rectum: Secondary | ICD-10-CM | POA: Diagnosis not present

## 2014-06-09 DIAGNOSIS — E291 Testicular hypofunction: Secondary | ICD-10-CM | POA: Diagnosis not present

## 2014-06-23 DIAGNOSIS — E291 Testicular hypofunction: Secondary | ICD-10-CM | POA: Diagnosis not present

## 2014-06-30 DIAGNOSIS — Z79899 Other long term (current) drug therapy: Secondary | ICD-10-CM | POA: Diagnosis not present

## 2014-06-30 DIAGNOSIS — R634 Abnormal weight loss: Secondary | ICD-10-CM | POA: Diagnosis not present

## 2014-06-30 DIAGNOSIS — M0570 Rheumatoid arthritis with rheumatoid factor of unspecified site without organ or systems involvement: Secondary | ICD-10-CM | POA: Diagnosis not present

## 2014-06-30 DIAGNOSIS — M25562 Pain in left knee: Secondary | ICD-10-CM | POA: Diagnosis not present

## 2014-07-07 DIAGNOSIS — E291 Testicular hypofunction: Secondary | ICD-10-CM | POA: Diagnosis not present

## 2014-07-21 DIAGNOSIS — E291 Testicular hypofunction: Secondary | ICD-10-CM | POA: Diagnosis not present

## 2014-08-04 DIAGNOSIS — E291 Testicular hypofunction: Secondary | ICD-10-CM | POA: Diagnosis not present

## 2014-08-18 DIAGNOSIS — E291 Testicular hypofunction: Secondary | ICD-10-CM | POA: Diagnosis not present

## 2014-08-18 DIAGNOSIS — L570 Actinic keratosis: Secondary | ICD-10-CM | POA: Diagnosis not present

## 2014-09-01 DIAGNOSIS — E291 Testicular hypofunction: Secondary | ICD-10-CM | POA: Diagnosis not present

## 2014-09-09 DIAGNOSIS — Z79899 Other long term (current) drug therapy: Secondary | ICD-10-CM | POA: Diagnosis not present

## 2014-09-09 DIAGNOSIS — E039 Hypothyroidism, unspecified: Secondary | ICD-10-CM | POA: Diagnosis not present

## 2014-09-09 DIAGNOSIS — E291 Testicular hypofunction: Secondary | ICD-10-CM | POA: Diagnosis not present

## 2014-09-16 DIAGNOSIS — Z6824 Body mass index (BMI) 24.0-24.9, adult: Secondary | ICD-10-CM | POA: Diagnosis not present

## 2014-09-16 DIAGNOSIS — E291 Testicular hypofunction: Secondary | ICD-10-CM | POA: Diagnosis not present

## 2014-09-16 DIAGNOSIS — Z6379 Other stressful life events affecting family and household: Secondary | ICD-10-CM | POA: Diagnosis not present

## 2014-09-16 DIAGNOSIS — M069 Rheumatoid arthritis, unspecified: Secondary | ICD-10-CM | POA: Diagnosis not present

## 2014-09-16 DIAGNOSIS — E230 Hypopituitarism: Secondary | ICD-10-CM | POA: Diagnosis not present

## 2014-09-16 DIAGNOSIS — K279 Peptic ulcer, site unspecified, unspecified as acute or chronic, without hemorrhage or perforation: Secondary | ICD-10-CM | POA: Diagnosis not present

## 2014-09-16 DIAGNOSIS — E039 Hypothyroidism, unspecified: Secondary | ICD-10-CM | POA: Diagnosis not present

## 2014-09-30 DIAGNOSIS — E291 Testicular hypofunction: Secondary | ICD-10-CM | POA: Diagnosis not present

## 2014-10-13 DIAGNOSIS — E291 Testicular hypofunction: Secondary | ICD-10-CM | POA: Diagnosis not present

## 2014-10-27 DIAGNOSIS — E291 Testicular hypofunction: Secondary | ICD-10-CM | POA: Diagnosis not present

## 2014-11-10 DIAGNOSIS — E291 Testicular hypofunction: Secondary | ICD-10-CM | POA: Diagnosis not present

## 2014-11-24 DIAGNOSIS — E291 Testicular hypofunction: Secondary | ICD-10-CM | POA: Diagnosis not present

## 2014-12-14 DIAGNOSIS — E291 Testicular hypofunction: Secondary | ICD-10-CM | POA: Diagnosis not present

## 2014-12-22 DIAGNOSIS — E291 Testicular hypofunction: Secondary | ICD-10-CM | POA: Diagnosis not present

## 2014-12-28 DIAGNOSIS — M79642 Pain in left hand: Secondary | ICD-10-CM | POA: Diagnosis not present

## 2014-12-28 DIAGNOSIS — Z79899 Other long term (current) drug therapy: Secondary | ICD-10-CM | POA: Diagnosis not present

## 2014-12-28 DIAGNOSIS — M0570 Rheumatoid arthritis with rheumatoid factor of unspecified site without organ or systems involvement: Secondary | ICD-10-CM | POA: Diagnosis not present

## 2014-12-28 DIAGNOSIS — M79671 Pain in right foot: Secondary | ICD-10-CM | POA: Diagnosis not present

## 2014-12-28 DIAGNOSIS — M79641 Pain in right hand: Secondary | ICD-10-CM | POA: Diagnosis not present

## 2014-12-28 DIAGNOSIS — M79672 Pain in left foot: Secondary | ICD-10-CM | POA: Diagnosis not present

## 2015-01-03 DIAGNOSIS — M0570 Rheumatoid arthritis with rheumatoid factor of unspecified site without organ or systems involvement: Secondary | ICD-10-CM | POA: Diagnosis not present

## 2015-01-03 DIAGNOSIS — Z79899 Other long term (current) drug therapy: Secondary | ICD-10-CM | POA: Diagnosis not present

## 2015-01-05 DIAGNOSIS — E291 Testicular hypofunction: Secondary | ICD-10-CM | POA: Diagnosis not present

## 2015-01-20 DIAGNOSIS — E039 Hypothyroidism, unspecified: Secondary | ICD-10-CM | POA: Diagnosis not present

## 2015-01-20 DIAGNOSIS — M069 Rheumatoid arthritis, unspecified: Secondary | ICD-10-CM | POA: Diagnosis not present

## 2015-01-20 DIAGNOSIS — K279 Peptic ulcer, site unspecified, unspecified as acute or chronic, without hemorrhage or perforation: Secondary | ICD-10-CM | POA: Diagnosis not present

## 2015-01-20 DIAGNOSIS — E230 Hypopituitarism: Secondary | ICD-10-CM | POA: Diagnosis not present

## 2015-01-20 DIAGNOSIS — E291 Testicular hypofunction: Secondary | ICD-10-CM | POA: Diagnosis not present

## 2015-01-20 DIAGNOSIS — Z6379 Other stressful life events affecting family and household: Secondary | ICD-10-CM | POA: Diagnosis not present

## 2015-01-20 DIAGNOSIS — Z6824 Body mass index (BMI) 24.0-24.9, adult: Secondary | ICD-10-CM | POA: Diagnosis not present

## 2015-02-02 DIAGNOSIS — E291 Testicular hypofunction: Secondary | ICD-10-CM | POA: Diagnosis not present

## 2015-02-16 DIAGNOSIS — E291 Testicular hypofunction: Secondary | ICD-10-CM | POA: Diagnosis not present

## 2015-02-17 DIAGNOSIS — L570 Actinic keratosis: Secondary | ICD-10-CM | POA: Diagnosis not present

## 2015-03-02 DIAGNOSIS — E291 Testicular hypofunction: Secondary | ICD-10-CM | POA: Diagnosis not present

## 2015-03-09 DIAGNOSIS — E291 Testicular hypofunction: Secondary | ICD-10-CM | POA: Diagnosis not present

## 2015-03-23 DIAGNOSIS — E291 Testicular hypofunction: Secondary | ICD-10-CM | POA: Diagnosis not present

## 2015-04-06 DIAGNOSIS — E291 Testicular hypofunction: Secondary | ICD-10-CM | POA: Diagnosis not present

## 2015-04-20 DIAGNOSIS — E298 Other testicular dysfunction: Secondary | ICD-10-CM | POA: Diagnosis not present

## 2015-05-04 DIAGNOSIS — E298 Other testicular dysfunction: Secondary | ICD-10-CM | POA: Diagnosis not present

## 2015-05-18 DIAGNOSIS — E298 Other testicular dysfunction: Secondary | ICD-10-CM | POA: Diagnosis not present

## 2015-06-01 DIAGNOSIS — E291 Testicular hypofunction: Secondary | ICD-10-CM | POA: Diagnosis not present

## 2015-06-06 DIAGNOSIS — E784 Other hyperlipidemia: Secondary | ICD-10-CM | POA: Diagnosis not present

## 2015-06-06 DIAGNOSIS — E278 Other specified disorders of adrenal gland: Secondary | ICD-10-CM | POA: Diagnosis not present

## 2015-06-06 DIAGNOSIS — E039 Hypothyroidism, unspecified: Secondary | ICD-10-CM | POA: Diagnosis not present

## 2015-06-06 DIAGNOSIS — E291 Testicular hypofunction: Secondary | ICD-10-CM | POA: Diagnosis not present

## 2015-06-06 DIAGNOSIS — Z79899 Other long term (current) drug therapy: Secondary | ICD-10-CM | POA: Diagnosis not present

## 2015-06-06 DIAGNOSIS — M859 Disorder of bone density and structure, unspecified: Secondary | ICD-10-CM | POA: Diagnosis not present

## 2015-06-06 DIAGNOSIS — Z125 Encounter for screening for malignant neoplasm of prostate: Secondary | ICD-10-CM | POA: Diagnosis not present

## 2015-06-08 DIAGNOSIS — E039 Hypothyroidism, unspecified: Secondary | ICD-10-CM | POA: Diagnosis not present

## 2015-06-13 DIAGNOSIS — J449 Chronic obstructive pulmonary disease, unspecified: Secondary | ICD-10-CM | POA: Diagnosis not present

## 2015-06-13 DIAGNOSIS — Z0000 Encounter for general adult medical examination without abnormal findings: Secondary | ICD-10-CM | POA: Diagnosis not present

## 2015-06-13 DIAGNOSIS — E291 Testicular hypofunction: Secondary | ICD-10-CM | POA: Diagnosis not present

## 2015-06-13 DIAGNOSIS — E230 Hypopituitarism: Secondary | ICD-10-CM | POA: Diagnosis not present

## 2015-06-13 DIAGNOSIS — M069 Rheumatoid arthritis, unspecified: Secondary | ICD-10-CM | POA: Diagnosis not present

## 2015-06-13 DIAGNOSIS — N200 Calculus of kidney: Secondary | ICD-10-CM | POA: Diagnosis not present

## 2015-06-13 DIAGNOSIS — M810 Age-related osteoporosis without current pathological fracture: Secondary | ICD-10-CM | POA: Diagnosis not present

## 2015-06-13 DIAGNOSIS — E784 Other hyperlipidemia: Secondary | ICD-10-CM | POA: Diagnosis not present

## 2015-06-13 DIAGNOSIS — Z6379 Other stressful life events affecting family and household: Secondary | ICD-10-CM | POA: Diagnosis not present

## 2015-06-13 DIAGNOSIS — E039 Hypothyroidism, unspecified: Secondary | ICD-10-CM | POA: Diagnosis not present

## 2015-06-13 DIAGNOSIS — C649 Malignant neoplasm of unspecified kidney, except renal pelvis: Secondary | ICD-10-CM | POA: Diagnosis not present

## 2015-06-13 DIAGNOSIS — Z1389 Encounter for screening for other disorder: Secondary | ICD-10-CM | POA: Diagnosis not present

## 2015-06-28 DIAGNOSIS — M79641 Pain in right hand: Secondary | ICD-10-CM | POA: Diagnosis not present

## 2015-06-28 DIAGNOSIS — M79642 Pain in left hand: Secondary | ICD-10-CM | POA: Diagnosis not present

## 2015-06-28 DIAGNOSIS — Z79899 Other long term (current) drug therapy: Secondary | ICD-10-CM | POA: Diagnosis not present

## 2015-06-28 DIAGNOSIS — M0570 Rheumatoid arthritis with rheumatoid factor of unspecified site without organ or systems involvement: Secondary | ICD-10-CM | POA: Diagnosis not present

## 2015-06-29 DIAGNOSIS — E291 Testicular hypofunction: Secondary | ICD-10-CM | POA: Diagnosis not present

## 2015-07-06 DIAGNOSIS — E291 Testicular hypofunction: Secondary | ICD-10-CM | POA: Diagnosis not present

## 2015-07-13 DIAGNOSIS — E291 Testicular hypofunction: Secondary | ICD-10-CM | POA: Diagnosis not present

## 2015-07-27 DIAGNOSIS — E291 Testicular hypofunction: Secondary | ICD-10-CM | POA: Diagnosis not present

## 2015-08-10 DIAGNOSIS — E291 Testicular hypofunction: Secondary | ICD-10-CM | POA: Diagnosis not present

## 2015-08-18 DIAGNOSIS — L821 Other seborrheic keratosis: Secondary | ICD-10-CM | POA: Diagnosis not present

## 2015-08-18 DIAGNOSIS — L570 Actinic keratosis: Secondary | ICD-10-CM | POA: Diagnosis not present

## 2015-08-24 DIAGNOSIS — E298 Other testicular dysfunction: Secondary | ICD-10-CM | POA: Diagnosis not present

## 2015-09-07 DIAGNOSIS — E291 Testicular hypofunction: Secondary | ICD-10-CM | POA: Diagnosis not present

## 2015-09-12 DIAGNOSIS — Z79899 Other long term (current) drug therapy: Secondary | ICD-10-CM | POA: Diagnosis not present

## 2015-09-12 DIAGNOSIS — M0570 Rheumatoid arthritis with rheumatoid factor of unspecified site without organ or systems involvement: Secondary | ICD-10-CM | POA: Diagnosis not present

## 2015-09-21 DIAGNOSIS — E291 Testicular hypofunction: Secondary | ICD-10-CM | POA: Diagnosis not present

## 2015-10-05 DIAGNOSIS — E291 Testicular hypofunction: Secondary | ICD-10-CM | POA: Diagnosis not present

## 2015-10-19 DIAGNOSIS — E291 Testicular hypofunction: Secondary | ICD-10-CM | POA: Diagnosis not present

## 2015-11-02 DIAGNOSIS — E291 Testicular hypofunction: Secondary | ICD-10-CM | POA: Diagnosis not present

## 2015-11-16 DIAGNOSIS — E291 Testicular hypofunction: Secondary | ICD-10-CM | POA: Diagnosis not present

## 2015-11-30 DIAGNOSIS — E291 Testicular hypofunction: Secondary | ICD-10-CM | POA: Diagnosis not present

## 2015-12-14 DIAGNOSIS — E291 Testicular hypofunction: Secondary | ICD-10-CM | POA: Diagnosis not present

## 2015-12-14 DIAGNOSIS — J069 Acute upper respiratory infection, unspecified: Secondary | ICD-10-CM | POA: Diagnosis not present

## 2015-12-14 DIAGNOSIS — J449 Chronic obstructive pulmonary disease, unspecified: Secondary | ICD-10-CM | POA: Diagnosis not present

## 2015-12-14 DIAGNOSIS — Z6824 Body mass index (BMI) 24.0-24.9, adult: Secondary | ICD-10-CM | POA: Diagnosis not present

## 2015-12-14 DIAGNOSIS — M069 Rheumatoid arthritis, unspecified: Secondary | ICD-10-CM | POA: Diagnosis not present

## 2015-12-14 DIAGNOSIS — E230 Hypopituitarism: Secondary | ICD-10-CM | POA: Diagnosis not present

## 2015-12-14 DIAGNOSIS — C649 Malignant neoplasm of unspecified kidney, except renal pelvis: Secondary | ICD-10-CM | POA: Diagnosis not present

## 2015-12-14 DIAGNOSIS — E038 Other specified hypothyroidism: Secondary | ICD-10-CM | POA: Diagnosis not present

## 2015-12-14 DIAGNOSIS — K279 Peptic ulcer, site unspecified, unspecified as acute or chronic, without hemorrhage or perforation: Secondary | ICD-10-CM | POA: Diagnosis not present

## 2015-12-27 DIAGNOSIS — M25442 Effusion, left hand: Secondary | ICD-10-CM | POA: Diagnosis not present

## 2015-12-27 DIAGNOSIS — M25441 Effusion, right hand: Secondary | ICD-10-CM | POA: Diagnosis not present

## 2015-12-27 DIAGNOSIS — Z79899 Other long term (current) drug therapy: Secondary | ICD-10-CM | POA: Diagnosis not present

## 2015-12-27 DIAGNOSIS — M0570 Rheumatoid arthritis with rheumatoid factor of unspecified site without organ or systems involvement: Secondary | ICD-10-CM | POA: Diagnosis not present

## 2015-12-27 DIAGNOSIS — D72829 Elevated white blood cell count, unspecified: Secondary | ICD-10-CM | POA: Diagnosis not present

## 2015-12-28 DIAGNOSIS — E291 Testicular hypofunction: Secondary | ICD-10-CM | POA: Diagnosis not present

## 2016-01-11 DIAGNOSIS — E291 Testicular hypofunction: Secondary | ICD-10-CM | POA: Diagnosis not present

## 2016-01-25 DIAGNOSIS — E291 Testicular hypofunction: Secondary | ICD-10-CM | POA: Diagnosis not present

## 2016-02-08 DIAGNOSIS — E291 Testicular hypofunction: Secondary | ICD-10-CM | POA: Diagnosis not present

## 2016-02-14 DIAGNOSIS — Z79899 Other long term (current) drug therapy: Secondary | ICD-10-CM | POA: Diagnosis not present

## 2016-02-14 DIAGNOSIS — D72819 Decreased white blood cell count, unspecified: Secondary | ICD-10-CM | POA: Diagnosis not present

## 2016-02-14 DIAGNOSIS — M0570 Rheumatoid arthritis with rheumatoid factor of unspecified site without organ or systems involvement: Secondary | ICD-10-CM | POA: Diagnosis not present

## 2016-02-14 DIAGNOSIS — M25442 Effusion, left hand: Secondary | ICD-10-CM | POA: Diagnosis not present

## 2016-02-14 DIAGNOSIS — M25441 Effusion, right hand: Secondary | ICD-10-CM | POA: Diagnosis not present

## 2016-02-16 DIAGNOSIS — L570 Actinic keratosis: Secondary | ICD-10-CM | POA: Diagnosis not present

## 2016-02-22 DIAGNOSIS — E291 Testicular hypofunction: Secondary | ICD-10-CM | POA: Diagnosis not present

## 2016-03-07 DIAGNOSIS — E291 Testicular hypofunction: Secondary | ICD-10-CM | POA: Diagnosis not present

## 2016-03-27 DIAGNOSIS — E291 Testicular hypofunction: Secondary | ICD-10-CM | POA: Diagnosis not present

## 2016-04-04 DIAGNOSIS — E039 Hypothyroidism, unspecified: Secondary | ICD-10-CM | POA: Diagnosis not present

## 2016-04-18 DIAGNOSIS — E291 Testicular hypofunction: Secondary | ICD-10-CM | POA: Diagnosis not present

## 2016-05-02 DIAGNOSIS — E291 Testicular hypofunction: Secondary | ICD-10-CM | POA: Diagnosis not present

## 2016-05-16 DIAGNOSIS — E291 Testicular hypofunction: Secondary | ICD-10-CM | POA: Diagnosis not present

## 2016-05-22 DIAGNOSIS — M79641 Pain in right hand: Secondary | ICD-10-CM | POA: Diagnosis not present

## 2016-05-22 DIAGNOSIS — D473 Essential (hemorrhagic) thrombocythemia: Secondary | ICD-10-CM | POA: Diagnosis not present

## 2016-05-22 DIAGNOSIS — M79642 Pain in left hand: Secondary | ICD-10-CM | POA: Diagnosis not present

## 2016-05-22 DIAGNOSIS — Z79899 Other long term (current) drug therapy: Secondary | ICD-10-CM | POA: Diagnosis not present

## 2016-05-22 DIAGNOSIS — M0570 Rheumatoid arthritis with rheumatoid factor of unspecified site without organ or systems involvement: Secondary | ICD-10-CM | POA: Diagnosis not present

## 2016-05-30 DIAGNOSIS — E291 Testicular hypofunction: Secondary | ICD-10-CM | POA: Diagnosis not present

## 2016-06-13 DIAGNOSIS — E291 Testicular hypofunction: Secondary | ICD-10-CM | POA: Diagnosis not present

## 2016-06-20 DIAGNOSIS — E278 Other specified disorders of adrenal gland: Secondary | ICD-10-CM | POA: Diagnosis not present

## 2016-06-20 DIAGNOSIS — E784 Other hyperlipidemia: Secondary | ICD-10-CM | POA: Diagnosis not present

## 2016-06-20 DIAGNOSIS — Z0000 Encounter for general adult medical examination without abnormal findings: Secondary | ICD-10-CM | POA: Diagnosis not present

## 2016-06-20 DIAGNOSIS — Z125 Encounter for screening for malignant neoplasm of prostate: Secondary | ICD-10-CM | POA: Diagnosis not present

## 2016-06-20 DIAGNOSIS — M810 Age-related osteoporosis without current pathological fracture: Secondary | ICD-10-CM | POA: Diagnosis not present

## 2016-06-20 DIAGNOSIS — E291 Testicular hypofunction: Secondary | ICD-10-CM | POA: Diagnosis not present

## 2016-06-20 DIAGNOSIS — E038 Other specified hypothyroidism: Secondary | ICD-10-CM | POA: Diagnosis not present

## 2016-06-27 DIAGNOSIS — J449 Chronic obstructive pulmonary disease, unspecified: Secondary | ICD-10-CM | POA: Diagnosis not present

## 2016-06-27 DIAGNOSIS — E291 Testicular hypofunction: Secondary | ICD-10-CM | POA: Diagnosis not present

## 2016-06-27 DIAGNOSIS — Z6379 Other stressful life events affecting family and household: Secondary | ICD-10-CM | POA: Diagnosis not present

## 2016-06-27 DIAGNOSIS — Z6824 Body mass index (BMI) 24.0-24.9, adult: Secondary | ICD-10-CM | POA: Diagnosis not present

## 2016-06-27 DIAGNOSIS — E038 Other specified hypothyroidism: Secondary | ICD-10-CM | POA: Diagnosis not present

## 2016-06-27 DIAGNOSIS — M810 Age-related osteoporosis without current pathological fracture: Secondary | ICD-10-CM | POA: Diagnosis not present

## 2016-06-27 DIAGNOSIS — N200 Calculus of kidney: Secondary | ICD-10-CM | POA: Diagnosis not present

## 2016-06-27 DIAGNOSIS — Z0000 Encounter for general adult medical examination without abnormal findings: Secondary | ICD-10-CM | POA: Diagnosis not present

## 2016-06-27 DIAGNOSIS — M069 Rheumatoid arthritis, unspecified: Secondary | ICD-10-CM | POA: Diagnosis not present

## 2016-06-27 DIAGNOSIS — K279 Peptic ulcer, site unspecified, unspecified as acute or chronic, without hemorrhage or perforation: Secondary | ICD-10-CM | POA: Diagnosis not present

## 2016-06-27 DIAGNOSIS — E784 Other hyperlipidemia: Secondary | ICD-10-CM | POA: Diagnosis not present

## 2016-06-27 DIAGNOSIS — E230 Hypopituitarism: Secondary | ICD-10-CM | POA: Diagnosis not present

## 2016-07-11 DIAGNOSIS — E291 Testicular hypofunction: Secondary | ICD-10-CM | POA: Diagnosis not present

## 2016-07-25 DIAGNOSIS — E291 Testicular hypofunction: Secondary | ICD-10-CM | POA: Diagnosis not present

## 2016-08-09 DIAGNOSIS — E291 Testicular hypofunction: Secondary | ICD-10-CM | POA: Diagnosis not present

## 2016-08-16 DIAGNOSIS — L570 Actinic keratosis: Secondary | ICD-10-CM | POA: Diagnosis not present

## 2016-08-22 DIAGNOSIS — E291 Testicular hypofunction: Secondary | ICD-10-CM | POA: Diagnosis not present

## 2016-08-24 DIAGNOSIS — R945 Abnormal results of liver function studies: Secondary | ICD-10-CM

## 2016-08-24 DIAGNOSIS — Z85118 Personal history of other malignant neoplasm of bronchus and lung: Secondary | ICD-10-CM

## 2016-08-24 DIAGNOSIS — D473 Essential (hemorrhagic) thrombocythemia: Secondary | ICD-10-CM | POA: Insufficient documentation

## 2016-08-24 DIAGNOSIS — Z8639 Personal history of other endocrine, nutritional and metabolic disease: Secondary | ICD-10-CM | POA: Insufficient documentation

## 2016-08-24 DIAGNOSIS — Z8546 Personal history of malignant neoplasm of prostate: Secondary | ICD-10-CM | POA: Insufficient documentation

## 2016-08-24 DIAGNOSIS — Z85528 Personal history of other malignant neoplasm of kidney: Secondary | ICD-10-CM | POA: Insufficient documentation

## 2016-08-24 DIAGNOSIS — Z79899 Other long term (current) drug therapy: Secondary | ICD-10-CM | POA: Insufficient documentation

## 2016-08-24 DIAGNOSIS — D75839 Thrombocytosis, unspecified: Secondary | ICD-10-CM | POA: Insufficient documentation

## 2016-08-24 DIAGNOSIS — R7989 Other specified abnormal findings of blood chemistry: Secondary | ICD-10-CM | POA: Insufficient documentation

## 2016-08-24 DIAGNOSIS — M0630 Rheumatoid nodule, unspecified site: Secondary | ICD-10-CM | POA: Insufficient documentation

## 2016-08-24 NOTE — Progress Notes (Signed)
Office Visit Note  Patient: Ryan Coffey             Date of Birth: 11-10-1932           MRN: [id number]             PCP: Prince Solian, MD Referring: Prince Solian, MD Visit Date: 08/29/2016 Occupation: Retired vice president for [employer]:  New Patient (Initial Visit) (states his joints feel fine today/ on MTX )   History of Present Illness: Ryan Coffey is a 83 y.o. male with history of sero positive rheumatoid arthritis. According to patient she was diagnosed with rheumatoid arthritis about 25 years ago by Dr. Dagmar Hait. He had multiple rheumatoid nodules as well. He was referred to Dr. Charlestine Night who started him on methotrexate. Patient recalls being on methotrexate 10 tablets by mouth every week. He states he did really well on that regimen and about 2-3 years ago his dose was reduced to 8 tablets by mouth every week. This has been working quite well for him. He denies any morning stiffness, joint pain or joint swelling. He has been working out on regular basis without any problems.  Activities of Daily Living:  Patient reports morning stiffness for 15 minutes.   Patient Denies nocturnal pain.  Difficulty dressing/grooming: Denies Difficulty climbing stairs: Denies Difficulty getting out of chair: Denies Difficulty using hands for taps, buttons, cutlery, and/or writing: Denies   Review of Systems  Constitutional: Negative for fatigue, night sweats and weakness ( ).  HENT: Negative for mouth sores, mouth dryness and nose dryness.   Eyes: Negative for redness and dryness.  Respiratory: Negative for shortness of breath and difficulty breathing.   Cardiovascular: Negative for chest pain, palpitations, hypertension, irregular heartbeat and swelling in legs/feet.  Gastrointestinal: Negative for constipation and diarrhea.  Endocrine: Negative for increased urination.  Musculoskeletal: Negative for arthralgias, joint pain, joint swelling, myalgias, muscle  weakness, morning stiffness, muscle tenderness and myalgias.  Skin: Negative for color change, rash, hair loss, nodules/bumps, skin tightness, ulcers and sensitivity to sunlight.       Subcutaneous nodules noted on bilateral hands  Allergic/Immunologic: Negative for susceptible to infections.  Neurological: Negative for dizziness, fainting, memory loss and night sweats.  Hematological: Negative for swollen glands.  Psychiatric/Behavioral: Negative for depressed mood and sleep disturbance. The patient is not nervous/anxious.     PMFS History:  Patient Active Problem List   Diagnosis Date Noted  . Renal cell carcinoma ([place]) 08/29/2016  . History of COPD 08/29/2016  . Rheumatoid nodulosis ([place]) 08/24/2016  . High risk medication use 08/24/2016  . Thrombocytosis ([place]) 08/24/2016  . Elevated LFTs mild elevation of ALT  08/24/2016  . History of renal cell carcinoma 08/24/2016  . History of prostate cancer 08/24/2016  . History of lung cancer 08/24/2016  . History of adrenal insufficiency 08/24/2016  . HCAP (healthcare-associated pneumonia) 06/27/2011    Class: Acute  . Seropositive rheumatoid arthritis ([place]) 06/27/2011  . Panhypopituitarism ([place]) 06/27/2011  . Degenerative joint disease 06/27/2011  . History of tobacco use 06/27/2011  . Hyperlipidemia 06/27/2011    Past Medical History:  Diagnosis Date  . Arthritis   . Benign tumor of pituitary gland ([place])    "wasn't able to get it all"  . Pneumonia 06/27/11   "first time"  . Shingles     Family History  Problem Relation Age of Onset  . Heart disease Mother   . Cancer Brother   . Heart  disease Brother    Past Surgical History:  Procedure Laterality Date  . CHOLECYSTECTOMY OPEN  2010  . KIDNEY SURGERY  2011   "lesion removed right side"  . RENAL BIOPSY  2012   right  . TRANSPHENOIDAL / TRANSNASAL HYPOPHYSECTOMY / RESECTION PITUITARY TUMOR  ~ 1979   "they couldn't get it all"   Social History   Social History Narrative   . No narrative on file     Objective: Vital Signs: BP 126/68 (BP Location: Right Arm)   Pulse 74   Resp 16   Ht 5\' 8"  (1.727 m)   Wt 165 lb (74.8 kg)   BMI 25.09 kg/m    Physical Exam  Constitutional: He is oriented to person, place, and time. He appears well-developed and well-nourished.  HENT:  Head: Normocephalic and atraumatic.  Eyes: Conjunctivae and EOM are normal. Pupils are equal, round, and reactive to light.  Neck: Normal range of motion. Neck supple.  Cardiovascular: Normal rate, regular rhythm and normal heart sounds.   Pulmonary/Chest: Effort normal and breath sounds normal.  Abdominal: Soft. Bowel sounds are normal.  Neurological: He is alert and oriented to person, place, and time.  Skin: Skin is warm and dry. Capillary refill takes less than 2 seconds.  Psychiatric: He has a normal mood and affect. His behavior is normal.  Nursing note and vitals reviewed.    Musculoskeletal Exam: C-spine and thoracic spine good range of motion. He has thoracic kyphosis. Shoulder joints good range of motion. He has right elbow joint to do very contracture. Bilateral wrists joint extension was 30 and flexion about 50. He has synovial thickening over bilateral wrist joints bilateral MCP joints he has subluxation of all of his left hand MCP joints. Hip joints and knee joints are good range of motion. He has some thickening of his left ankle joint. He has severe arthritic changes in his left foot with overcrowding of toes and subluxation. He had postsurgical changes in his right foot from reconstruction surgery.  CDAI Exam: CDAI Homunculus Exam:   Joint Counts:  CDAI Tender Joint count: 0 CDAI Swollen Joint count: 0  Global Assessments:  Patient Global Assessment: 4 Provider Global Assessment: 4  CDAI Calculated Score: 8    Investigation: Findings:  06/20/2016 CMP normal except for AST sl elevated 42, CBC Normal,  Triglycerides 92, LDL 62, TSH low, 0.0, PSA normal 2.020,  Vitamin D normal 35,    Imaging: Xr Foot 2 Views Left  Result Date: 08/29/2016 Severe narrowing of all MTP joints were noted. Erosive changes in all MTP joints were noted. Subluxation of all MTPs were noted. DIP PIP narrowing was noted. No significant intertarsal joint space narrowing was noted. Subtalar joint space narrowing was noted. These findings are consistent with severe rheumatoid arthritis with erosive changes  Xr Foot 2 Views Right  Result Date: 08/29/2016 Postsurgical changes and right foot noted. He has a screw in his right first MTP joint erosive changes noted in all MTP joints. There is ankylosis of fourth and fifth toe and severe narrowing of all other MTPs joints and PIP/DIP joints. No intertarsal joint space narrowing the subtalar joint space narrowing was noted. Impression these findings are consistent with rheumatoid arthritis with erosive changes. Surgical changes were noted.  Xr Hand 2 View Left  Result Date: 08/29/2016 Juxta articular osteopenia. Subluxation of all MCP joints. Erosive changes noted in all MCP joints. PIP/DIP narrowing was noted. Erosive changes noted and ulnar styloid. No significant intercarpal joint or  radiocarpal joint space narrowing was noted. Impression these findings are consistent with severe rheumatoid arthritis  Xr Hand 2 View Right  Result Date: 08/29/2016 juxta articular osteopenia noted. Minimal PIP/DIP narrowing was noted. Severe right second MCP narrowing with subluxation and erosive changes was noted. Severe radiocarpal and intercarpal and metacarpocarpal joint space narrowing was noted. Erosive changes were noted in the carpal bones and ulnar styloid. These findings were consistent with severe rheumatoid arthritis   Speciality Comments: No specialty comments available.    Procedures:  No procedures performed Allergies: Patient has no known allergies.   Assessment / Plan:     Visit Diagnoses: Seropositive rheumatoid arthritis ([place])  - +RF. Severe erosive disease with subluxation in multiple joints. Patient has long-standing history of rheumatoid arthritis. He is been on methotrexate for 25 years and has been doing fairly well. He does have end-stage arthritis with subluxation of multiple joints and contracture in multiple joints as described above. I'll obtain baseline labs today and x-rays. The plan is to keep him on current dose of methotrexate. I've advised him to increase his folic acid to 2 mg by mouth daily. - Plan: XR Hand 2 View Right, XR Hand 2 View Left, XR Foot 2 Views Right, XR Foot 2 Views Left  Rheumatoid nodulosis [facility]): He has multiple nodules on his bilateral hands.  High risk medication use - Methotrexate 8 tabs po qwk, he takes it divided twice a week., folic acid 1 mg po qd  Thrombocytosis ([place]): We'll check labs today.  Elevated LFTs mild elevation of ALT : He had mild ALT elevation in April.  History of adrenal insufficiency: He is on cortisone. Followed up by PCP  Panhypopituitarism ([place]): Taking bromocriptine  History of tobacco use: In the past  History of hyperlipidemia: He is on statin therapy  History of peptic ulcer disease  History of kidney stones  History of renal cancer  History of COPD : Emphysema was noted on the chest x-ray.  Patient has significant kyphosis. He states his bone density was normal in 2016. I've advised him to bring those results next visit so we can evaluate that.  Orders: Orders Placed This Encounter  Procedures  . XR Hand 2 View Right  . XR Hand 2 View Left  . XR Foot 2 Views Right  . XR Foot 2 Views Left  . DG Chest 2 View  . CBC with Differential/Platelet  . COMPLETE METABOLIC PANEL WITH GFR  . Urinalysis, Routine w reflex microscopic  . Sedimentation rate  . Rheumatoid Factor  . Cyclic citrul peptide antibody, IgG  . Serum protein electrophoresis with reflex  . IgG, IgA, IgM  . Hepatitis A antibody, IgM  . Hepatitis C Antibody  . Hepatitis B  Core Antibody, IgM  . Hepatitis B Surface AntiGEN  . Quantiferon tb gold assay (blood)   Meds ordered this encounter  Medications  . folic acid (FOLVITE) 1 MG tablet    Sig: Take 2 tablets (2 mg total) by mouth daily.    Dispense:  180 tablet    Refill:  3    Face-to-face time spent with patient was 50 minutes. 50% of time was spent in counseling and coordination of care.  Follow-Up Instructions: Return for Rheumatoid arthritis.   Bo Merino, MD  Note - This record has been created using Editor, commissioning.  Chart creation errors have been sought, but may not always  have been located. Such creation errors do not reflect on  the standard of medical  care.

## 2016-08-29 ENCOUNTER — Ambulatory Visit (INDEPENDENT_AMBULATORY_CARE_PROVIDER_SITE_OTHER): Payer: Medicare Other

## 2016-08-29 ENCOUNTER — Encounter (INDEPENDENT_AMBULATORY_CARE_PROVIDER_SITE_OTHER): Payer: Self-pay

## 2016-08-29 ENCOUNTER — Ambulatory Visit (INDEPENDENT_AMBULATORY_CARE_PROVIDER_SITE_OTHER): Payer: Medicare Other | Admitting: Rheumatology

## 2016-08-29 ENCOUNTER — Encounter: Payer: Self-pay | Admitting: Rheumatology

## 2016-08-29 VITALS — BP 126/68 | HR 74 | Resp 16 | Ht 68.0 in | Wt 165.0 lb

## 2016-08-29 DIAGNOSIS — C649 Malignant neoplasm of unspecified kidney, except renal pelvis: Secondary | ICD-10-CM

## 2016-08-29 DIAGNOSIS — Z8709 Personal history of other diseases of the respiratory system: Secondary | ICD-10-CM

## 2016-08-29 DIAGNOSIS — Z8711 Personal history of peptic ulcer disease: Secondary | ICD-10-CM

## 2016-08-29 DIAGNOSIS — M0630 Rheumatoid nodule, unspecified site: Secondary | ICD-10-CM

## 2016-08-29 DIAGNOSIS — E230 Hypopituitarism: Secondary | ICD-10-CM | POA: Diagnosis not present

## 2016-08-29 DIAGNOSIS — Z79899 Other long term (current) drug therapy: Secondary | ICD-10-CM | POA: Diagnosis not present

## 2016-08-29 DIAGNOSIS — M059 Rheumatoid arthritis with rheumatoid factor, unspecified: Secondary | ICD-10-CM

## 2016-08-29 DIAGNOSIS — Z8639 Personal history of other endocrine, nutritional and metabolic disease: Secondary | ICD-10-CM

## 2016-08-29 DIAGNOSIS — J449 Chronic obstructive pulmonary disease, unspecified: Secondary | ICD-10-CM | POA: Insufficient documentation

## 2016-08-29 DIAGNOSIS — R7989 Other specified abnormal findings of blood chemistry: Secondary | ICD-10-CM

## 2016-08-29 DIAGNOSIS — Z111 Encounter for screening for respiratory tuberculosis: Secondary | ICD-10-CM | POA: Diagnosis not present

## 2016-08-29 DIAGNOSIS — D899 Disorder involving the immune mechanism, unspecified: Secondary | ICD-10-CM

## 2016-08-29 DIAGNOSIS — D473 Essential (hemorrhagic) thrombocythemia: Secondary | ICD-10-CM | POA: Diagnosis not present

## 2016-08-29 DIAGNOSIS — Z87891 Personal history of nicotine dependence: Secondary | ICD-10-CM

## 2016-08-29 DIAGNOSIS — R945 Abnormal results of liver function studies: Secondary | ICD-10-CM

## 2016-08-29 DIAGNOSIS — Z1159 Encounter for screening for other viral diseases: Secondary | ICD-10-CM | POA: Diagnosis not present

## 2016-08-29 DIAGNOSIS — D849 Immunodeficiency, unspecified: Secondary | ICD-10-CM

## 2016-08-29 DIAGNOSIS — D75839 Thrombocytosis, unspecified: Secondary | ICD-10-CM

## 2016-08-29 DIAGNOSIS — Z87442 Personal history of urinary calculi: Secondary | ICD-10-CM | POA: Diagnosis not present

## 2016-08-29 LAB — CBC WITH DIFFERENTIAL/PLATELET
BASOS PCT: 1 %
Basophils Absolute: 69 cells/uL (ref 0–200)
EOS ABS: 69 {cells}/uL (ref 15–500)
Eosinophils Relative: 1 %
HEMATOCRIT: 46.5 % (ref 38.5–50.0)
HEMOGLOBIN: 15.1 g/dL (ref 13.2–17.1)
LYMPHS ABS: 1242 {cells}/uL (ref 850–3900)
Lymphocytes Relative: 18 %
MCH: 33 pg (ref 27.0–33.0)
MCHC: 32.5 g/dL (ref 32.0–36.0)
MCV: 101.8 fL — AB (ref 80.0–100.0)
MONO ABS: 621 {cells}/uL (ref 200–950)
MPV: 9.5 fL (ref 7.5–12.5)
Monocytes Relative: 9 %
NEUTROS ABS: 4899 {cells}/uL (ref 1500–7800)
Neutrophils Relative %: 71 %
Platelets: 427 10*3/uL — ABNORMAL HIGH (ref 140–400)
RBC: 4.57 MIL/uL (ref 4.20–5.80)
RDW: 15.8 % — ABNORMAL HIGH (ref 11.0–15.0)
WBC: 6.9 10*3/uL (ref 3.8–10.8)

## 2016-08-29 MED ORDER — FOLIC ACID 1 MG PO TABS: 2.0000 mg | ORAL_TABLET | Freq: Every day | ORAL | 3 refills | Status: DC

## 2016-08-29 NOTE — Patient Instructions (Addendum)
Increase folic acid to 2 mg by mouth daily  Standing Labs We placed an order today for your standing lab work.    Please come back and get your standing labs in September 2018 and every 3 months  We have open lab Monday through Friday from 8:30-11:30 AM and 1:30-4 PM at the office of Dr. Tresa Moore, PA.   The office is located at [address], [place], [place], [place] [address] No appointment is necessary.   Labs are drawn by [facility].  You may receive a bill from [facility] for your lab work. If you have any questions regarding directions or hours of operation,  please call [phone].     Methotrexate tablets What is this medicine? METHOTREXATE (METH oh TREX ate) is a chemotherapy drug used to treat cancer including breast cancer, leukemia, and lymphoma. This medicine can also be used to treat psoriasis and certain kinds of arthritis. This medicine may be used for other purposes; ask your health care provider or pharmacist if you have questions. COMMON BRAND NAME(S): Rheumatrex, Trexall What should I tell my health care provider before I take this medicine? They need to know if you have any of these conditions: -fluid in the stomach area or lungs -if you often drink alcohol -infection or immune system problems -kidney disease or on hemodialysis -liver disease -low blood counts, like low white cell, platelet, or red cell counts -lung disease -radiation therapy -stomach ulcers -ulcerative colitis -an unusual or allergic reaction to methotrexate, other medicines, foods, dyes, or preservatives -pregnant or trying to get pregnant -breast-feeding How should I use this medicine? Take this medicine by mouth with a glass of water. Follow the directions on the prescription label. Take your medicine at regular intervals. Do not take it more often than directed. Do not stop taking except on your doctor's advice. Make sure you know why you are taking this medicine and  how often you should take it. If this medicine is used for a condition that is not cancer, like arthritis or psoriasis, it should be taken weekly, NOT daily. Taking this medicine more often than directed can cause serious side effects, even death. Talk to your healthcare provider about safe handling and disposal of this medicine. You may need to take special precautions. Talk to your pediatrician regarding the use of this medicine in children. While this drug may be prescribed for selected conditions, precautions do apply. Overdosage: If you think you have taken too much of this medicine contact a poison control center or emergency room at once. NOTE: This medicine is only for you. Do not share this medicine with others. What if I miss a dose? If you miss a dose, talk with your doctor or health care professional. Do not take double or extra doses. What may interact with this medicine? This medicine may interact with the following medication: -acitretin -aspirin and aspirin-like medicines including salicylates -azathioprine -certain antibiotics like penicillins, tetracycline, and chloramphenicol -cyclosporine -gold -hydroxychloroquine -live virus vaccines -NSAIDs, medicines for pain and inflammation, like ibuprofen or naproxen -other cytotoxic agents -penicillamine -phenylbutazone -phenytoin -probenecid -retinoids such as isotretinoin and tretinoin -steroid medicines like prednisone or cortisone -sulfonamides like sulfasalazine and trimethoprim/sulfamethoxazole -theophylline This list may not describe all possible interactions. Give your health care provider a list of all the medicines, herbs, non-prescription drugs, or dietary supplements you use. Also tell them if you smoke, drink alcohol, or use illegal drugs. Some items may interact with your medicine. What should I watch for while  using this medicine? Avoid alcoholic drinks. This medicine can make you more sensitive to the sun. Keep  out of the sun. If you cannot avoid being in the sun, wear protective clothing and use sunscreen. Do not use sun lamps or tanning beds/booths. You may need blood work done while you are taking this medicine. Call your doctor or health care professional for advice if you get a fever, chills or sore throat, or other symptoms of a cold or flu. Do not treat yourself. This drug decreases your body's ability to fight infections. Try to avoid being around people who are sick. This medicine may increase your risk to bruise or bleed. Call your doctor or health care professional if you notice any unusual bleeding. Check with your doctor or health care professional if you get an attack of severe diarrhea, nausea and vomiting, or if you sweat a lot. The loss of too much body fluid can make it dangerous for you to take this medicine. Talk to your doctor about your risk of cancer. You may be more at risk for certain types of cancers if you take this medicine. Both men and women must use effective birth control with this medicine. Do not become pregnant while taking this medicine or until at least 1 normal menstrual cycle has occurred after stopping it. Women should inform their doctor if they wish to become pregnant or think they might be pregnant. Men should not father a child while taking this medicine and for 3 months after stopping it. There is a potential for serious side effects to an unborn child. Talk to your health care professional or pharmacist for more information. Do not breast-feed an infant while taking this medicine. What side effects may I notice from receiving this medicine? Side effects that you should report to your doctor or health care professional as soon as possible: -allergic reactions like skin rash, itching or hives, swelling of the face, lips, or tongue -breathing problems or shortness of breath -diarrhea -dry, nonproductive cough -low blood counts - this medicine may decrease the number  of white blood cells, red blood cells and platelets. You may be at increased risk for infections and bleeding. -mouth sores -redness, blistering, peeling or loosening of the skin, including inside the mouth -signs of infection - fever or chills, cough, sore throat, pain or trouble passing urine -signs and symptoms of bleeding such as bloody or black, tarry stools; red or dark-brown urine; spitting up blood or brown material that looks like coffee grounds; red spots on the skin; unusual bruising or bleeding from the eye, gums, or nose -signs and symptoms of kidney injury like trouble passing urine or change in the amount of urine -signs and symptoms of liver injury like dark yellow or brown urine; general ill feeling or flu-like symptoms; light-colored stools; loss of appetite; nausea; right upper belly pain; unusually weak or tired; yellowing of the eyes or skin Side effects that usually do not require medical attention (report to your doctor or health care professional if they continue or are bothersome): -dizziness -hair loss -tiredness -upset stomach -vomiting This list may not describe all possible side effects. Call your doctor for medical advice about side effects. You may report side effects to FDA at 1-800-FDA-1088. Where should I keep my medicine? Keep out of the reach of children. Store at room temperature between 20 and 25 degrees C (68 and 77 degrees F). Protect from light. Throw away any unused medicine after the expiration date. NOTE: This  sheet is a summary. It may not cover all possible information. If you have questions about this medicine, talk to your doctor, pharmacist, or health care provider.  2018 Elsevier/Gold Standard (1601-09-32 35:57:32)  Folic Acid, Vitamin B9 tablets What is this medicine? FOLIC ACID (FOE lik AS id) is a water-soluble, B complex vitamin. It is in many foods like liver, kidneys, yeast, and leafy, green vegetables. It is used to treat megaloblastic  anemia and anemia from poor diet in pregnant women, babies, and children. This medicine may be used for other purposes; ask your health care provider or pharmacist if you have questions. COMMON BRAND NAME(S): Folacin, Folicet What should I tell my health care provider before I take this medicine? They need to know if you have any of these conditions: -alcoholism or alcohol cirrhosis -pernicious anemia -vitamin B12 deficient anemia -an unusual or allergic reaction to folic acid, other B vitamins, other medicines, foods, dyes, or preservatives -pregnant or trying to get pregnant -breast-feeding How should I use this medicine? Take this medicine by mouth with a glass of water. Follow the directions on your prescription label. Take your doses at regular intervals. Do not stop taking your medicine unless your doctor tells you to. Talk to your pediatrician regarding the use of this medicine in children. While this drug may be prescribed for selected conditions, precautions do apply. Overdosage: If you think you have taken too much of this medicine contact a poison control center or emergency room at once. NOTE: This medicine is only for you. Do not share this medicine with others. What if I miss a dose? If you miss a dose, take it as soon as you can. If it is almost time for your next dose, take only that dose. Do not take double or extra doses. What may interact with this medicine? -chloramphenicol -cholestyramine -medicines for seizures -methotrexate -nitrofurantoin -pyrimethamine This list may not describe all possible interactions. Give your health care provider a list of all the medicines, herbs, non-prescription drugs, or dietary supplements you use. Also tell them if you smoke, drink alcohol, or use illegal drugs. Some items may interact with your medicine. What should I watch for while using this medicine? Visit your doctor or health care professional for regular check ups. Your doctor  may order blood tests. You need to eat a proper diet even while you are taking this vitamin. Taking vitamin supplements is not a substitute for a healthy diet. Ask your doctor or health care provider for good nutrition advice. What side effects may I notice from receiving this medicine? Side effects that you should report to your doctor or health care professional as soon as possible: -allergic reactions such as skin rash or itching, hives, swelling of the lips, mouth, tongue, or throat -chest tightness or pain -wheezing or shortness of breath Side effects that usually do not require medical attention (report to your doctor or health care professional if they continue or are bothersome): -bitter or bad taste -confusion -irritable -loss of appetite -nausea -stomach gas This list may not describe all possible side effects. Call your doctor for medical advice about side effects. You may report side effects to FDA at 1-800-FDA-1088. Where should I keep my medicine? Keep out of the reach of children. Store at room temperature between 15 and 30 degrees C (59 and 86 degrees F). Protect from light. This medicine is quickly broken down and made inactive when exposed to heat or light. Throw away any unused medicine after the expiration  date. NOTE: This sheet is a summary. It may not cover all possible information. If you have questions about this medicine, talk to your doctor, pharmacist, or health care provider.  2018 Elsevier/Gold Standard (2007-06-25 17:03:56)

## 2016-08-29 NOTE — Progress Notes (Signed)
Pharmacy Note  Subjective: Patient presents today to the [facility] to see Dr. Estanislado Pandy.  Patient is currently prescribed methotrexate 20 mg weekly by previous rheumatologist and he currently taking folic acid 1 mg daily.  Patient seen by the pharmacist for counseling on methotrexate.    Objective: CBC, CMP ordered today TB Gold: ordered today Hepatitis panel: ordered today  Chest-xray:  02/09/13  Contraception: discussed  Alcohol use: occasional   Assessment/Plan:  Patient was counseled on the purpose, proper use, and adverse effects of methotrexate including nausea, infection, and signs and symptoms of pneumonitis.  Reviewed instructions with patient to take methotrexate weekly along with folic acid daily.  Advised patient to increase folic acid dose to 2 mg daily.  Discussed the importance of frequent monitoring of kidney and liver function and blood counts, and provided patient with standing lab instructions.  Counseled patient to avoid NSAIDs and alcohol while on methotrexate.  Provided patient with educational materials on methotrexate and answered all questions.  Advised patient to get annual influenza vaccine and to get a pneumococcal vaccine if patient has not already had one.  Patient voiced understanding.  Patient consented to methotrexate use.  Will upload into chart.    Elisabeth Most, Pharm.D., BCPS Clinical Pharmacist Pager: [phone] Phone: [phone] 08/29/2016 8:57 AM

## 2016-08-30 LAB — CYCLIC CITRUL PEPTIDE ANTIBODY, IGG: Cyclic Citrullin Peptide Ab: >250 Units — ABNORMAL HIGH

## 2016-08-30 LAB — URINALYSIS, ROUTINE W REFLEX MICROSCOPIC
BILIRUBIN URINE: NEGATIVE
Glucose, UA: NEGATIVE
HGB URINE DIPSTICK: NEGATIVE
Leukocytes, UA: NEGATIVE
NITRITE: NEGATIVE
PH: 5.5 (ref 5.0–8.0)
Specific Gravity, Urine: 1.031 (ref 1.001–1.035)

## 2016-08-30 LAB — COMPLETE METABOLIC PANEL WITH GFR
ALT: 50 U/L — AB (ref 9–46)
AST: 41 U/L — ABNORMAL HIGH (ref 10–35)
Albumin: 4.2 g/dL (ref 3.6–5.1)
Alkaline Phosphatase: 108 U/L (ref 40–115)
BUN: 14 mg/dL (ref 7–25)
CALCIUM: 9.6 mg/dL (ref 8.6–10.3)
CO2: 25 mmol/L (ref 20–31)
CREATININE: 0.93 mg/dL (ref 0.70–1.11)
Chloride: 103 mmol/L (ref 98–110)
GFR, EST AFRICAN AMERICAN: 89 mL/min (ref >=60)
GFR, Est Non African American: 77 mL/min (ref >=60)
Glucose, Bld: 91 mg/dL (ref 65–99)
Potassium: 5 mmol/L (ref 3.5–5.3)
Sodium: 141 mmol/L (ref 135–146)
TOTAL PROTEIN: 6.8 g/dL (ref 6.1–8.1)
Total Bilirubin: 0.9 mg/dL (ref 0.2–1.2)

## 2016-08-30 LAB — SEDIMENTATION RATE: Sed Rate: 1 mm/hr (ref 0–20)

## 2016-08-30 LAB — HEPATITIS B SURFACE ANTIGEN: HEP B S AG: NEGATIVE

## 2016-08-30 LAB — HEPATITIS B CORE ANTIBODY, IGM: HEP B C IGM: NONREACTIVE

## 2016-08-30 LAB — HEPATITIS A ANTIBODY, IGM: HEP A IGM: NONREACTIVE

## 2016-08-30 LAB — RHEUMATOID FACTOR: RHEUMATOID FACTOR: 384 [IU]/mL — AB (ref <14)

## 2016-08-30 LAB — HEPATITIS C ANTIBODY: HCV AB: NEGATIVE

## 2016-08-31 LAB — QUANTIFERON TB GOLD ASSAY (BLOOD)
Interferon Gamma Release Assay: NEGATIVE
MITOGEN-NIL SO: 2.06 [IU]/mL
Quantiferon Nil Value: 0.03 IU/mL
Quantiferon Tb Ag Minus Nil Value: 0.03 IU/mL

## 2016-08-31 LAB — IGG, IGA, IGM
IGA: 131 mg/dL (ref 81–463)
IGM, SERUM: 75 mg/dL (ref 48–271)
IgG (Immunoglobin G), Serum: 833 mg/dL (ref 694–1618)

## 2016-09-03 LAB — PROTEIN ELECTROPHORESIS, SERUM, WITH REFLEX
ALPHA-1-GLOBULIN: 0.3 g/dL (ref 0.2–0.3)
Albumin ELP: 4 g/dL (ref 3.8–4.8)
Alpha-2-Globulin: 0.9 g/dL (ref 0.5–0.9)
BETA GLOBULIN: 0.5 g/dL (ref 0.4–0.6)
Beta 2: 0.4 g/dL (ref 0.2–0.5)
Gamma Globulin: 0.7 g/dL — ABNORMAL LOW (ref 0.8–1.7)
Total Protein, Serum Electrophoresis: 6.8 g/dL (ref 6.1–8.1)

## 2016-09-03 NOTE — Progress Notes (Signed)
He was clinically doing well. Please reduce methotrexate to 7 tablets by mouth every week due to elevated LFTs

## 2016-09-06 DIAGNOSIS — E291 Testicular hypofunction: Secondary | ICD-10-CM | POA: Diagnosis not present

## 2016-09-10 ENCOUNTER — Telehealth: Payer: Self-pay | Admitting: *Deleted

## 2016-09-10 NOTE — Telephone Encounter (Signed)
-----   Message from Bo Merino, MD sent at 09/03/2016  5:38 PM EDT ----- He was clinically doing well. Please reduce methotrexate to 7 tablets by mouth every week due to elevated LFTs

## 2016-09-10 NOTE — Telephone Encounter (Signed)
Attempted to contact the patient and left message for patient to call the office.  

## 2016-09-11 ENCOUNTER — Telehealth: Payer: Self-pay | Admitting: Rheumatology

## 2016-09-11 NOTE — Telephone Encounter (Signed)
Patient advised of lab results and recommendations. Patient verbalized understanding.  

## 2016-09-11 NOTE — Telephone Encounter (Signed)
Patient returned your call.

## 2016-09-11 NOTE — Telephone Encounter (Signed)
See previous phone note.  

## 2016-09-19 DIAGNOSIS — E298 Other testicular dysfunction: Secondary | ICD-10-CM | POA: Diagnosis not present

## 2016-09-19 DIAGNOSIS — E291 Testicular hypofunction: Secondary | ICD-10-CM | POA: Diagnosis not present

## 2016-09-28 ENCOUNTER — Ambulatory Visit: Payer: Self-pay | Admitting: Rheumatology

## 2016-10-03 DIAGNOSIS — E291 Testicular hypofunction: Secondary | ICD-10-CM | POA: Diagnosis not present

## 2016-10-17 DIAGNOSIS — E291 Testicular hypofunction: Secondary | ICD-10-CM | POA: Diagnosis not present

## 2016-10-31 DIAGNOSIS — E291 Testicular hypofunction: Secondary | ICD-10-CM | POA: Diagnosis not present

## 2016-11-14 DIAGNOSIS — E291 Testicular hypofunction: Secondary | ICD-10-CM | POA: Diagnosis not present

## 2016-11-23 ENCOUNTER — Other Ambulatory Visit: Payer: Self-pay | Admitting: *Deleted

## 2016-11-23 DIAGNOSIS — Z79899 Other long term (current) drug therapy: Secondary | ICD-10-CM | POA: Diagnosis not present

## 2016-11-23 LAB — COMPLETE METABOLIC PANEL WITH GFR
AG Ratio: 1.7 (calc) (ref 1.0–2.5)
ALKALINE PHOSPHATASE (APISO): 80 U/L (ref 40–115)
ALT: 14 U/L (ref 9–46)
AST: 20 U/L (ref 10–35)
Albumin: 4.1 g/dL (ref 3.6–5.1)
BUN/Creatinine Ratio: 18 (calc) (ref 6–22)
BUN: 23 mg/dL (ref 7–25)
CO2: 29 mmol/L (ref 20–32)
CREATININE: 1.25 mg/dL — AB (ref 0.70–1.11)
Calcium: 9.1 mg/dL (ref 8.6–10.3)
Chloride: 102 mmol/L (ref 98–110)
GFR, EST NON AFRICAN AMERICAN: 54 mL/min/{1.73_m2} — AB (ref >=60)
GFR, Est African American: 62 mL/min/{1.73_m2} (ref >=60)
GLUCOSE: 131 mg/dL — AB (ref 65–99)
Globulin: 2.4 g/dL (calc) (ref 1.9–3.7)
Potassium: 4.9 mmol/L (ref 3.5–5.3)
SODIUM: 138 mmol/L (ref 135–146)
Total Bilirubin: 0.5 mg/dL (ref 0.2–1.2)
Total Protein: 6.5 g/dL (ref 6.1–8.1)

## 2016-11-23 LAB — CBC WITH DIFFERENTIAL/PLATELET
BASOS ABS: 62 {cells}/uL (ref 0–200)
Basophils Relative: 0.8 %
EOS PCT: 0.9 %
Eosinophils Absolute: 70 cells/uL (ref 15–500)
HCT: 43.4 % (ref 38.5–50.0)
Hemoglobin: 14.8 g/dL (ref 13.2–17.1)
LYMPHS ABS: 1178 {cells}/uL (ref 850–3900)
MCH: 33.6 pg — ABNORMAL HIGH (ref 27.0–33.0)
MCHC: 34.1 g/dL (ref 32.0–36.0)
MCV: 98.6 fL (ref 80.0–100.0)
MPV: 9.5 fL (ref 7.5–12.5)
Monocytes Relative: 8.4 %
NEUTROS ABS: 5834 {cells}/uL (ref 1500–7800)
NEUTROS PCT: 74.8 %
Platelets: 378 10*3/uL (ref 140–400)
RBC: 4.4 10*6/uL (ref 4.20–5.80)
RDW: 13.3 % (ref 11.0–15.0)
Total Lymphocyte: 15.1 %
WBC mixed population: 655 cells/uL (ref 200–950)
WBC: 7.8 10*3/uL (ref 3.8–10.8)

## 2016-11-24 NOTE — Progress Notes (Signed)
Decrease MTX to 6 tabs po qweek due to increase in cr

## 2016-11-26 ENCOUNTER — Telehealth: Payer: Self-pay | Admitting: Rheumatology

## 2016-11-26 NOTE — Telephone Encounter (Signed)
Patient returned Ryan Coffey's call about lab results.  Please call patient back.

## 2016-11-26 NOTE — Telephone Encounter (Signed)
Patient advised of lab results and to decrease MTX to 6 tabs per week. Patient verbalized understanding.

## 2016-11-28 DIAGNOSIS — E291 Testicular hypofunction: Secondary | ICD-10-CM | POA: Diagnosis not present

## 2016-12-12 DIAGNOSIS — E291 Testicular hypofunction: Secondary | ICD-10-CM | POA: Diagnosis not present

## 2016-12-18 NOTE — Progress Notes (Deleted)
Office Visit Note  Patient: Ryan Coffey             Date of Birth: 02/24/1933           MRN: [id number]             PCP: Prince Solian, MD Referring: Prince Solian, MD Visit Date: 12/31/2016 Occupation: @GUAROCC @    Subjective:  No chief complaint on file.   History of Present Illness: Ryan Coffey is a 83 y.o. male ***   Activities of Daily Living:  Patient reports morning stiffness for *** {minute/hour:19697}.   Patient {ACTIONS;DENIES/REPORTS:21021675::"Denies"} nocturnal pain.  Difficulty dressing/grooming: {ACTIONS;DENIES/REPORTS:21021675::"Denies"} Difficulty climbing stairs: {ACTIONS;DENIES/REPORTS:21021675::"Denies"} Difficulty getting out of chair: {ACTIONS;DENIES/REPORTS:21021675::"Denies"} Difficulty using hands for taps, buttons, cutlery, and/or writing: {ACTIONS;DENIES/REPORTS:21021675::"Denies"}   No Rheumatology ROS completed.   PMFS History:  Patient Active Problem List   Diagnosis Date Noted  . Renal cell carcinoma ([place]) 08/29/2016  . History of COPD 08/29/2016  . Rheumatoid nodulosis ([place]) 08/24/2016  . High risk medication use 08/24/2016  . Thrombocytosis ([place]) 08/24/2016  . Elevated LFTs mild elevation of ALT  08/24/2016  . History of renal cell carcinoma 08/24/2016  . History of prostate cancer 08/24/2016  . History of lung cancer 08/24/2016  . History of adrenal insufficiency 08/24/2016  . HCAP (healthcare-associated pneumonia) 06/27/2011    Class: Acute  . Seropositive rheumatoid arthritis ([place]) 06/27/2011  . Panhypopituitarism ([place]) 06/27/2011  . Degenerative joint disease 06/27/2011  . History of tobacco use 06/27/2011  . Hyperlipidemia 06/27/2011    Past Medical History:  Diagnosis Date  . Arthritis   . Benign tumor of pituitary gland ([place])    "wasn't able to get it all"  . Pneumonia 06/27/11   "first time"  . Shingles     Family History  Problem Relation Age of Onset  . Heart disease Mother   . Cancer Brother   .  Heart disease Brother    Past Surgical History:  Procedure Laterality Date  . CHOLECYSTECTOMY OPEN  2010  . KIDNEY SURGERY  2011   "lesion removed right side"  . RENAL BIOPSY  2012   right  . TRANSPHENOIDAL / TRANSNASAL HYPOPHYSECTOMY / RESECTION PITUITARY TUMOR  ~ 1979   "they couldn't get it all"   Social History   Social History Narrative  . No narrative on file     Objective: Vital Signs: There were no vitals taken for this visit.   Physical Exam   Musculoskeletal Exam: ***  CDAI Exam: No CDAI exam completed.    Investigation: No additional findings. TB Gold: Negative in 08/2016 CBC Latest Ref Rng & Units 11/23/2016 08/29/2016 06/28/2011  WBC 3.8 - 10.8 Thousand/uL 7.8 6.9 22.8(H)  Hemoglobin 13.2 - 17.1 g/dL 14.8 15.1 11.5(L)  Hematocrit 38.5 - 50.0 % 43.4 46.5 34.2(L)  Platelets 140 - 400 Thousand/uL 378 427(H) 438(H)   CMP Latest Ref Rng & Units 11/23/2016 08/29/2016 06/28/2011  Glucose 65 - 99 mg/dL 131(H) 91 108(H)  BUN 7 - 25 mg/dL 23 14 14   Creatinine 2.62 - 1.11 mg/dL 1.25(H) 0.93 0.80  Sodium 135 - 146 mmol/L 138 141 135  Potassium 3.5 - 5.3 mmol/L 4.9 5.0 3.7  Chloride 98 - 110 mmol/L 102 103 97  CO2 20 - 32 mmol/L 29 25 29   Calcium 8.6 - 10.3 mg/dL 9.1 9.6 9.0  Total Protein 6.1 - 8.1 g/dL 6.5 6.8 -  Total Bilirubin 0.2 - 1.2 mg/dL 0.5 0.9 -  Alkaline Phos 40 - 115  U/L - 108 -  AST 10 - 35 U/L 20 41(H) -  ALT 9 - 46 U/L 14 50(H) -   Imaging: No results found.  Speciality Comments: No specialty comments available.    Procedures:  No procedures performed Allergies: Patient has no known allergies.   Assessment / Plan:     Visit Diagnoses: No diagnosis found.    Orders: No orders of the defined types were placed in this encounter.  No orders of the defined types were placed in this encounter.   Face-to-face time spent with patient was *** minutes. 50% of time was spent in counseling and coordination of care.  Follow-Up Instructions: No  Follow-up on file.   Earnestine Mealing, NT  Note - This record has been created using Editor, commissioning.  Chart creation errors have been sought, but may not always  have been located. Such creation errors do not reflect on  the standard of medical care.

## 2016-12-19 DIAGNOSIS — J449 Chronic obstructive pulmonary disease, unspecified: Secondary | ICD-10-CM | POA: Diagnosis not present

## 2016-12-19 DIAGNOSIS — Z6824 Body mass index (BMI) 24.0-24.9, adult: Secondary | ICD-10-CM | POA: Diagnosis not present

## 2016-12-19 DIAGNOSIS — Z6379 Other stressful life events affecting family and household: Secondary | ICD-10-CM | POA: Diagnosis not present

## 2016-12-19 DIAGNOSIS — E038 Other specified hypothyroidism: Secondary | ICD-10-CM | POA: Diagnosis not present

## 2016-12-19 DIAGNOSIS — E298 Other testicular dysfunction: Secondary | ICD-10-CM | POA: Diagnosis not present

## 2016-12-19 DIAGNOSIS — E278 Other specified disorders of adrenal gland: Secondary | ICD-10-CM | POA: Diagnosis not present

## 2016-12-19 DIAGNOSIS — E291 Testicular hypofunction: Secondary | ICD-10-CM | POA: Diagnosis not present

## 2016-12-19 DIAGNOSIS — N200 Calculus of kidney: Secondary | ICD-10-CM | POA: Diagnosis not present

## 2016-12-19 DIAGNOSIS — M810 Age-related osteoporosis without current pathological fracture: Secondary | ICD-10-CM | POA: Diagnosis not present

## 2016-12-19 DIAGNOSIS — M069 Rheumatoid arthritis, unspecified: Secondary | ICD-10-CM | POA: Diagnosis not present

## 2016-12-19 DIAGNOSIS — E230 Hypopituitarism: Secondary | ICD-10-CM | POA: Diagnosis not present

## 2016-12-26 DIAGNOSIS — E298 Other testicular dysfunction: Secondary | ICD-10-CM | POA: Diagnosis not present

## 2016-12-31 ENCOUNTER — Ambulatory Visit (INDEPENDENT_AMBULATORY_CARE_PROVIDER_SITE_OTHER): Payer: Medicare Other | Admitting: Rheumatology

## 2016-12-31 ENCOUNTER — Ambulatory Visit: Payer: Medicare Other | Admitting: Rheumatology

## 2016-12-31 ENCOUNTER — Encounter: Payer: Self-pay | Admitting: Rheumatology

## 2016-12-31 VITALS — BP 139/75 | HR 87 | Resp 16 | Ht 69.0 in | Wt 163.0 lb

## 2016-12-31 DIAGNOSIS — R945 Abnormal results of liver function studies: Secondary | ICD-10-CM | POA: Diagnosis not present

## 2016-12-31 DIAGNOSIS — M0630 Rheumatoid nodule, unspecified site: Secondary | ICD-10-CM | POA: Diagnosis not present

## 2016-12-31 DIAGNOSIS — R7989 Other specified abnormal findings of blood chemistry: Secondary | ICD-10-CM

## 2016-12-31 DIAGNOSIS — M059 Rheumatoid arthritis with rheumatoid factor, unspecified: Secondary | ICD-10-CM

## 2016-12-31 DIAGNOSIS — Z79899 Other long term (current) drug therapy: Secondary | ICD-10-CM

## 2016-12-31 NOTE — Patient Instructions (Signed)
   CBC with differential, CMP with GFR every 8 weeks starting12/29/2018

## 2016-12-31 NOTE — Progress Notes (Signed)
Office Visit Note  Patient: Ryan Coffey             Date of Birth: 11/27/1932           MRN: [id number]             PCP: Prince Solian, MD Referring: Prince Solian, MD Visit Date: 12/31/2016 Occupation: _0 @    Subjective:  Arthritis (Doing good)   History of Present Illness: Ryan Coffey is a 83 y.o. male  Was last seen 08/29/2016. That was his initial visit. On that visit, it was noted that he was referred to [name] by Dr. Dagmar Hait. Patient had been seen by another rheumatologist, Dr. Charlestine Night. According to the notes, he was started on methotrexate 10 tablets per week. He did welluntil 2-3 years ago when he was reduced to 8 t per week. That was working well for the patient and he was not having any problems. Most recently, his liver functions were abnormal and as of today, patient is on 6 pills of methotrexate (3 on Saturday, 3 on Sunday) and takes 2 folic acid so every day.  He does not have any complaint at this time. No morning stiffness.  He has a history of severe ulnar deviation of his left hand at the MCP joints with his left fifth finger at greater than 90 angle compared to his metatarsal joint.  Activities of Daily Living:  Patient reports morning stiffness for 5 minutes.   Patient Denies nocturnal pain.  Difficulty dressing/grooming: Denies Difficulty climbing stairs: Denies Difficulty getting out of chair: Denies Difficulty using hands for taps, buttons, cutlery, and/or writing: Denies   Review of Systems  Constitutional: Negative for fatigue.  HENT: Negative for mouth sores and mouth dryness.   Eyes: Negative for dryness.  Respiratory: Negative for shortness of breath.   Gastrointestinal: Negative for constipation and diarrhea.  Musculoskeletal: Negative for myalgias and myalgias.  Skin: Negative for sensitivity to sunlight.  Neurological: Negative for memory loss.  Psychiatric/Behavioral: Negative for sleep disturbance.    PMFS History:    Patient Active Problem List   Diagnosis Date Noted  . Renal cell carcinoma ([place]) 08/29/2016  . History of COPD 08/29/2016  . Rheumatoid nodulosis ([place]) 08/24/2016  . High risk medication use 08/24/2016  . Thrombocytosis ([place]) 08/24/2016  . Elevated LFTs mild elevation of ALT  08/24/2016  . History of renal cell carcinoma 08/24/2016  . History of prostate cancer 08/24/2016  . History of lung cancer 08/24/2016  . History of adrenal insufficiency 08/24/2016  . HCAP (healthcare-associated pneumonia) 06/27/2011    Class: Acute  . Seropositive rheumatoid arthritis ([place]) 06/27/2011  . Panhypopituitarism ([place]) 06/27/2011  . Degenerative joint disease 06/27/2011  . History of tobacco use 06/27/2011  . Hyperlipidemia 06/27/2011    Past Medical History:  Diagnosis Date  . Arthritis   . Benign tumor of pituitary gland ([place])    "wasn't able to get it all"  . Pneumonia 06/27/11   "first time"  . Shingles     Family History  Problem Relation Age of Onset  . Heart disease Mother   . Cancer Brother   . Heart disease Brother    Past Surgical History:  Procedure Laterality Date  . CHOLECYSTECTOMY OPEN  2010  . KIDNEY SURGERY  2011   "lesion removed right side"  . RENAL BIOPSY  2012   right  . TRANSPHENOIDAL / TRANSNASAL HYPOPHYSECTOMY / RESECTION PITUITARY TUMOR  ~ 1979   "they couldn't get it all"  Social History   Social History Narrative  . No narrative on file     Objective: Vital Signs: BP 139/75 (BP Location: Left Arm, Patient Position: Sitting, Cuff Size: Normal)   Pulse 87   Resp 16   Ht 5' 9" (1.753 m)   Wt 163 lb (73.9 kg)   BMI 24.07 kg/m    Physical Exam  Constitutional: He is oriented to person, place, and time. He appears well-developed and well-nourished.  HENT:  Head: Normocephalic and atraumatic.  Eyes: Pupils are equal, round, and reactive to light. Conjunctivae and EOM are normal.  Neck: Normal range of motion. Neck supple.  Cardiovascular: Normal  rate, regular rhythm and normal heart sounds.  Exam reveals no gallop and no friction rub.   No murmur heard. Pulmonary/Chest: Effort normal and breath sounds normal. No respiratory distress. He has no wheezes. He has no rales. He exhibits no tenderness.  Abdominal: Soft. He exhibits no distension and no mass. There is no tenderness. There is no guarding.  Musculoskeletal: Normal range of motion.  Lymphadenopathy:    He has no cervical adenopathy.  Neurological: He is alert and oriented to person, place, and time. He exhibits normal muscle tone. Coordination normal.  Skin: Skin is warm and dry. Capillary refill takes less than 2 seconds. No rash noted.  Psychiatric: He has a normal mood and affect. His behavior is normal. Judgment and thought content normal.  Vitals reviewed.    Musculoskeletal Exam:  Full range of motion of all joints except decreased extension and flexion of bilateral wrist. Grip strength is fairly good on the right; left has significant ulnar deviation at the MCP joints and has very poor grip strength. Fiber myalgia tender points are absent  CDAI Exam: No CDAI exam completed.  Second MCP with synovial thickening with no active problem. Multiple nodulosis noted on hands bilaterally.   Investigation: No additional findings. Orders Only on 11/23/2016  Component Date Value Ref Range Status  . WBC 11/23/2016 7.8  3.8 - 10.8 Thousand/uL Final  . RBC 11/23/2016 4.40  4.20 - 5.80 Million/uL Final  . Hemoglobin 11/23/2016 14.8  13.2 - 17.1 g/dL Final  . HCT 11/23/2016 43.4  38.5 - 50.0 % Final  . MCV 11/23/2016 98.6  80.0 - 100.0 fL Final  . MCH 11/23/2016 33.6* 27.0 - 33.0 pg Final  . MCHC 11/23/2016 34.1  32.0 - 36.0 g/dL Final  . RDW 11/23/2016 13.3  11.0 - 15.0 % Final  . Platelets 11/23/2016 378  140 - 400 Thousand/uL Final  . MPV 11/23/2016 9.5  7.5 - 12.5 fL Final  . Neutro Abs 11/23/2016 5834  1,500 - 7,800 cells/uL Final  . Lymphs Abs 11/23/2016 1178  850 -  3,900 cells/uL Final  . WBC mixed population 11/23/2016 655  200 - 950 cells/uL Final  . Eosinophils Absolute 11/23/2016 70  15 - 500 cells/uL Final  . Basophils Absolute 11/23/2016 62  0 - 200 cells/uL Final  . Neutrophils Relative % 11/23/2016 74.8  % Final  . Total Lymphocyte 11/23/2016 15.1  % Final  . Monocytes Relative 11/23/2016 8.4  % Final  . Eosinophils Relative 11/23/2016 0.9  % Final  . Basophils Relative 11/23/2016 0.8  % Final  . Glucose, Bld 11/23/2016 131* 65 - 99 mg/dL Final   Comment: .            Fasting reference interval . For someone without known diabetes, a glucose value >125 mg/dL indicates that they may have diabetes  and this should be confirmed with a follow-up test. .   . BUN 11/23/2016 23  7 - 25 mg/dL Final  . Creat 11/23/2016 1.25* 0.70 - 1.11 mg/dL Final   Comment: For patients >2 years of age, the reference limit for Creatinine is approximately 13% higher for people identified as African-American. .   . GFR, Est Non African American 11/23/2016 54* > OR = 60 mL/min/1.91m Final  . GFR, Est African American 11/23/2016 62  > OR = 60 mL/min/1.747mFinal  . BUN/Creatinine Ratio 11/23/2016 18  6 - 22 (calc) Final  . Sodium 11/23/2016 138  135 - 146 mmol/L Final  . Potassium 11/23/2016 4.9  3.5 - 5.3 mmol/L Final  . Chloride 11/23/2016 102  98 - 110 mmol/L Final  . CO2 11/23/2016 29  20 - 32 mmol/L Final  . Calcium 11/23/2016 9.1  8.6 - 10.3 mg/dL Final  . Total Protein 11/23/2016 6.5  6.1 - 8.1 g/dL Final  . Albumin 11/23/2016 4.1  3.6 - 5.1 g/dL Final  . Globulin 11/23/2016 2.4  1.9 - 3.7 g/dL (calc) Final  . AG Ratio 11/23/2016 1.7  1.0 - 2.5 (calc) Final  . Total Bilirubin 11/23/2016 0.5  0.2 - 1.2 mg/dL Final  . Alkaline phosphatase (APISO) 11/23/2016 80  40 - 115 U/L Final  . AST 11/23/2016 20  10 - 35 U/L Final  . ALT 11/23/2016 14  9 - 46 U/L Final  Office Visit on 08/29/2016  Component Date Value Ref Range Status  . WBC 08/29/2016 6.9   3.8 - 10.8 K/uL Final  . RBC 08/29/2016 4.57  4.20 - 5.80 MIL/uL Final  . Hemoglobin 08/29/2016 15.1  13.2 - 17.1 g/dL Final  . HCT 08/29/2016 46.5  38.5 - 50.0 % Final  . MCV 08/29/2016 101.8* 80.0 - 100.0 fL Final  . MCH 08/29/2016 33.0  27.0 - 33.0 pg Final  . MCHC 08/29/2016 32.5  32.0 - 36.0 g/dL Final  . RDW 08/29/2016 15.8* 11.0 - 15.0 % Final  . Platelets 08/29/2016 427* 140 - 400 K/uL Final  . MPV 08/29/2016 9.5  7.5 - 12.5 fL Final  . Neutro Abs 08/29/2016 4899  1,500 - 7,800 cells/uL Final  . Lymphs Abs 08/29/2016 1242  850 - 3,900 cells/uL Final  . Monocytes Absolute 08/29/2016 621  200 - 950 cells/uL Final  . Eosinophils Absolute 08/29/2016 69  15 - 500 cells/uL Final  . Basophils Absolute 08/29/2016 69  0 - 200 cells/uL Final  . Neutrophils Relative % 08/29/2016 71  % Final  . Lymphocytes Relative 08/29/2016 18  % Final  . Monocytes Relative 08/29/2016 9  % Final  . Eosinophils Relative 08/29/2016 1  % Final  . Basophils Relative 08/29/2016 1  % Final  . Smear Review 08/29/2016 Criteria for review not met   Final  . Sodium 08/29/2016 141  135 - 146 mmol/L Final  . Potassium 08/29/2016 5.0  3.5 - 5.3 mmol/L Final  . Chloride 08/29/2016 103  98 - 110 mmol/L Final  . CO2 08/29/2016 25  20 - 31 mmol/L Final  . Glucose, Bld 08/29/2016 91  65 - 99 mg/dL Final  . BUN 08/29/2016 14  7 - 25 mg/dL Final  . Creat 08/29/2016 0.93  0.70 - 1.11 mg/dL Final   Comment:   For patients > or = 5016ears of age: The upper reference limit for Creatinine is approximately 13% higher for people identified as African-American.     . Total  Bilirubin 08/29/2016 0.9  0.2 - 1.2 mg/dL Final  . Alkaline Phosphatase 08/29/2016 108  40 - 115 U/L Final  . AST 08/29/2016 41* 10 - 35 U/L Final  . ALT 08/29/2016 50* 9 - 46 U/L Final  . Total Protein 08/29/2016 6.8  6.1 - 8.1 g/dL Final  . Albumin 08/29/2016 4.2  3.6 - 5.1 g/dL Final  . Calcium 08/29/2016 9.6  8.6 - 10.3 mg/dL Final  . GFR, Est  African American 08/29/2016 89  >=60 mL/min Final  . GFR, Est Non African American 08/29/2016 77  >=60 mL/min Final  . Color, Urine 08/29/2016 DARK YELLOW  YELLOW Final  . APPearance 08/29/2016 CLEAR  CLEAR Final  . Specific Gravity, Urine 08/29/2016 1.031  1.001 - 1.035 Final  . pH 08/29/2016 5.5  5.0 - 8.0 Final  . Glucose, UA 08/29/2016 NEGATIVE  NEGATIVE Final  . Bilirubin Urine 08/29/2016 NEGATIVE  NEGATIVE Final  . Ketones, ur 08/29/2016 TRACE* NEGATIVE Final  . Hgb urine dipstick 08/29/2016 NEGATIVE  NEGATIVE Final  . Protein, ur 08/29/2016 TRACE* NEGATIVE Final  . Nitrite 08/29/2016 NEGATIVE  NEGATIVE Final  . Leukocytes, UA 08/29/2016 NEGATIVE  NEGATIVE Final  . Sed Rate 08/29/2016 1  0 - 20 mm/hr Final  . Rhuematoid fact SerPl-aCnc 08/29/2016 384* <14 IU/mL Final  . Cyclic Citrullin Peptide Ab 08/29/2016 >250* Units Final   Comment:   Reference Range Negative               < 20 Weak Positive            20 - 39 Moderate Positive        40 - 59 Strong Positive        > 59   . Total Protein, Serum Electrophores* 08/29/2016 6.8  6.1 - 8.1 g/dL Final  . Albumin ELP 08/29/2016 4.0  3.8 - 4.8 g/dL Final  . Alpha-1-Globulin 08/29/2016 0.3  0.2 - 0.3 g/dL Final  . Alpha-2-Globulin 08/29/2016 0.9  0.5 - 0.9 g/dL Final  . Beta Globulin 08/29/2016 0.5  0.4 - 0.6 g/dL Final  . Beta 2 08/29/2016 0.4  0.2 - 0.5 g/dL Final  . Gamma Globulin 08/29/2016 0.7* 0.8 - 1.7 g/dL Final  . Abnormal Protein Band1 08/29/2016 NOT DET  g/dL Final  . SPE Interp. 08/29/2016 SEE NOTE   Final   Comment: One or more serum protein fractions are outside the normal ranges. No abnormal protein bands are apparent. Reviewed by Odis Hollingshead, MD, PhD, FCAP (Electronic Signature on File)   . Abnormal Protein Band2 08/29/2016 NOT DET  g/dL Final  . Abnormal Protein Band3 08/29/2016 NOT DET  g/dL Final  . IgG (Immunoglobin G), Serum 08/29/2016 833  694 - 1,618 mg/dL Final  . IgA 08/29/2016 131  81 - 463  mg/dL Final  . IgM, Serum 08/29/2016 75  48 - 271 mg/dL Final  . Hep A IgM 08/29/2016 NON REACTIVE  NON REACTIVE Final  . HCV Ab 08/29/2016 NEGATIVE  NEGATIVE Final  . Hep B C IgM 08/29/2016 NON REACTIVE  NON REACTIVE Final   Comment: High levels of Hepatitis B Core IgM antibody are detectable during the acute stage of Hepatitis B. This antibody is used to differentiate current from past HBV infection.     . Hepatitis B Surface Ag 08/29/2016 NEGATIVE  NEGATIVE Final  . Interferon Gamma Release Assay 08/29/2016 NEGATIVE  NEGATIVE Final   Negative test result. M. tuberculosis complex infection unlikely.  Laurance Flatten Nil Value  08/29/2016 0.03  IU/mL Final  . Mitogen-Nil 08/29/2016 2.06  IU/mL Final  . Quantiferon Tb Ag Minus Nil Value 08/29/2016 0.03  IU/mL Final   Comment:   The Nil tube value is used to determine if the patient has a preexisting immune response which could cause a false-positive reading on the test. In order for a test to be valid, the Nil tube must have a value of less than or equal to 8.0 IU/mL.   The mitogen control tube is used to assure the patient has a healthy immune status and also serves as a control for correct blood handling and incubation. It is used to detect false-negative readings. The mitogen tube must have a gamma interferon value of greater than or equal to 0.5 IU/mL higher than the value of the Nil tube.   The TB antigen tube is coated with the M. tuberculosis specific antigens. For a test to be considered positive, the TB antigen tube value minus the Nil tube value must be greater than or equal to 0.35 IU/mL.   For additional information, please refer to http://education.questdiagn[facility].com/faq/QFT (This link is being provided for informational/educational purposes only.)      Imaging: No results found.            Speciality Comments: No specialty comments available.    Procedures:  No procedures performed Allergies:  Patient has no known allergies.   Assessment / Plan:     Visit Diagnoses: Seropositive rheumatoid arthritis ([place])  Rheumatoid nodulosis ([facility])  High risk medication use  Elevated LFTs mild elevation of ALT     pLan: #1: Seropositive rheumatoid arthritis. Patient reports that he is doing well with her rheumatoid arthritis. We have decreased his methotrexate and currently patient is taking 6 pills per week. 3 pills or taken on Saturday, 3 pills or taken on Sunday patient takes 2 mg of  Folic acid every day. Adequate response. Right second MCP has synovial thickening but no warmth, redness. Right hand has proper alignment of the metatarsals and phalangeal joints. Left hand has severe angulationsstarting from the fifth MCP joint and improving only slightly to the second MCP joint. Please see photographsin today's chart for full details. Asian has had x-rays done of bilateral hands as well as ultrasound. Please see those reports for full details.  #2: High risk prescription Methotrexate 6 pills per week (3 on Saturday, 3 on Sunday)  folic acid 2 mg daily Adequate response  Patient sees Dr. Dagmar Hait According to the patient, dr Dagmar Hait drew labs couple of weeks ago. Unfortunately, reviewing the patient'dical record, I cannot find copies of those. The last labs that I have are dated 11/23/2016. At one time, patient's liver numbers were abnormal and as a result we have had to decis methotrexate at curer week is what he is tolerating well. According to the patient, all of his labs were normal according to his PCPs office. I'm interested in getting those results later into patient's chart so we can document that the liver numbers are within normal limits.  If the labs were done 2 weeks ago, then he will need labs in 2-3 months later. He will need CBC with differential and CMP with GFR.  CBC with differential, CMP with GFR every 8 weeks starting12/29/2018  #3: Rheumatoid nodule. Patient may  benefit from cortisone injection into those nodules in the near future. Currently, the nodules are not a source of discomfort for the patient. He has "learned to live with them"  #4: Osteoarthritis of  the hands  #5: History of elevated LFTs (mild elevation of ALT). AST 11/23/2016 20  10 - 35 U/L Final  ALT 11/23/2016 14  9 - 46 U/L Final    History of degenerative disc disease  History of COPD;tobacco use  History of hyperlipidemia     Orders: No orders of the defined types were placed in this encounter.  No orders of the defined types were placed in this encounter.   Face-to-face time spent with patient was 30 minutes. 50% of time was spent in counseling and coordination of care.  Follow-Up Instructions: Return in about 5 months (around 05/31/2017) for RA, MTX 6/wk, Folic 65m, oa hands, mcp lt hand angulations.   NEliezer Lofts PA-C  I examined and evaluated the patient with NEliezer LoftsPA. The plan of care was discussed as noted above.  SBo Merino MD Note - This record has been created using DEditor, commissioning  Chart creation errors have been sought, but may not always  have been located. Such creation errors do not reflect on  the standard of medical care.

## 2017-01-09 DIAGNOSIS — E291 Testicular hypofunction: Secondary | ICD-10-CM | POA: Diagnosis not present

## 2017-01-22 DIAGNOSIS — E291 Testicular hypofunction: Secondary | ICD-10-CM | POA: Diagnosis not present

## 2017-02-06 DIAGNOSIS — E298 Other testicular dysfunction: Secondary | ICD-10-CM | POA: Diagnosis not present

## 2017-02-20 DIAGNOSIS — E298 Other testicular dysfunction: Secondary | ICD-10-CM | POA: Diagnosis not present

## 2017-03-04 ENCOUNTER — Other Ambulatory Visit: Payer: Self-pay

## 2017-03-04 ENCOUNTER — Other Ambulatory Visit: Payer: Self-pay | Admitting: Rheumatology

## 2017-03-04 DIAGNOSIS — Z79899 Other long term (current) drug therapy: Secondary | ICD-10-CM | POA: Diagnosis not present

## 2017-03-04 LAB — COMPLETE METABOLIC PANEL WITH GFR
AG Ratio: 1.8 (calc) (ref 1.0–2.5)
ALBUMIN MSPROF: 4 g/dL (ref 3.6–5.1)
ALKALINE PHOSPHATASE (APISO): 59 U/L (ref 40–115)
ALT: 19 U/L (ref 9–46)
AST: 32 U/L (ref 10–35)
BILIRUBIN TOTAL: 0.8 mg/dL (ref 0.2–1.2)
BUN: 19 mg/dL (ref 7–25)
CHLORIDE: 103 mmol/L (ref 98–110)
CO2: 31 mmol/L (ref 20–32)
CREATININE: 1.07 mg/dL (ref 0.70–1.11)
Calcium: 9.3 mg/dL (ref 8.6–10.3)
GFR, Est African American: 75 mL/min/{1.73_m2} (ref >=60)
GFR, Est Non African American: 64 mL/min/{1.73_m2} (ref >=60)
GLUCOSE: 92 mg/dL (ref 65–99)
Globulin: 2.2 g/dL (calc) (ref 1.9–3.7)
Potassium: 4.8 mmol/L (ref 3.5–5.3)
Sodium: 140 mmol/L (ref 135–146)
Total Protein: 6.2 g/dL (ref 6.1–8.1)

## 2017-03-04 LAB — CBC WITH DIFFERENTIAL/PLATELET
BASOS PCT: 0.7 %
Basophils Absolute: 69 cells/uL (ref 0–200)
Eosinophils Absolute: 157 cells/uL (ref 15–500)
Eosinophils Relative: 1.6 %
HEMATOCRIT: 43.7 % (ref 38.5–50.0)
Hemoglobin: 14.5 g/dL (ref 13.2–17.1)
LYMPHS ABS: 1450 {cells}/uL (ref 850–3900)
MCH: 32.5 pg (ref 27.0–33.0)
MCHC: 33.2 g/dL (ref 32.0–36.0)
MCV: 98 fL (ref 80.0–100.0)
MPV: 9.6 fL (ref 7.5–12.5)
Monocytes Relative: 6.1 %
NEUTROS PCT: 76.8 %
Neutro Abs: 7526 cells/uL (ref 1500–7800)
Platelets: 400 10*3/uL (ref 140–400)
RBC: 4.46 10*6/uL (ref 4.20–5.80)
RDW: 12.7 % (ref 11.0–15.0)
Total Lymphocyte: 14.8 %
WBC mixed population: 598 cells/uL (ref 200–950)
WBC: 9.8 10*3/uL (ref 3.8–10.8)

## 2017-03-04 NOTE — Telephone Encounter (Signed)
Last Visit: 12/31/16 Next Visit: 06/04/17 Labs: 11/23/16 Decrease MTX to 6 tabs po qweek due to increase in cr  Patient will update labs today  Okay to refill per Dr. Estanislado Pandy

## 2017-03-06 ENCOUNTER — Telehealth: Payer: Self-pay | Admitting: *Deleted

## 2017-03-06 DIAGNOSIS — E298 Other testicular dysfunction: Secondary | ICD-10-CM | POA: Diagnosis not present

## 2017-03-06 NOTE — Telephone Encounter (Signed)
Patient states since cutting back to 6 tabs of MTX per week he is stiffer and having more pain in the mornings. Patient states he wanted to let us know to see if there was anything that can be done,. Most recent labs are normal.

## 2017-03-06 NOTE — Telephone Encounter (Signed)
Sch appt. To add PLQ to his regimen.

## 2017-03-07 NOTE — Telephone Encounter (Signed)
Patient advised Dr. Estanislado Pandy would like for him to have an appointment to discuss adding a new medication to his regimen. Patient states his son in law is having surgery tomorrow and he will call at the first of next week to make an appointment.

## 2017-03-20 DIAGNOSIS — E298 Other testicular dysfunction: Secondary | ICD-10-CM | POA: Diagnosis not present

## 2017-03-29 NOTE — Progress Notes (Signed)
Office Visit Note  Patient: Ryan Coffey             Date of Birth: 1932-07-19           MRN: [id number]             PCP: Prince Solian, MD Referring: Prince Solian, MD Visit Date: 04/01/2017 Occupation: @GUAROCC @    Subjective:  Joint stiffness   History of Present Illness: Ryan Coffey is a 84 y.o. male with history of sero positive erosive rheumatoid arthritis. He states when he decreases methotrexate to 6 tablets per week his joint symptoms got worse with increased pain and stiffness in multiple joints. In December when his labs came back to normal he decided to increase his methotrexate to 8 tablets per week. He noticed improvement in his joint symptoms on going back to 8 tablets per week. The dose of methotrexate was decreased due to elevation of LFTs and elevation of creatinine. He has joint discomfort mostly in his hands. No joint swelling is reported.  Activities of Daily Living:  Patient reports morning stiffness for 1 hour.   Patient Denies nocturnal pain.  Difficulty dressing/grooming: Denies Difficulty climbing stairs: Denies Difficulty getting out of chair: Denies Difficulty using hands for taps, buttons, cutlery, and/or writing: Denies   Review of Systems  Constitutional: Negative for fatigue, night sweats and weakness ( ).  HENT: Negative for mouth sores, mouth dryness and nose dryness.   Eyes: Negative for redness and dryness.  Respiratory: Negative for shortness of breath and difficulty breathing.   Cardiovascular: Negative for chest pain, palpitations, hypertension, irregular heartbeat and swelling in legs/feet.  Gastrointestinal: Negative for constipation and diarrhea.  Endocrine: Negative for increased urination.  Musculoskeletal: Positive for arthralgias, joint pain and morning stiffness. Negative for joint swelling, myalgias, muscle weakness, muscle tenderness and myalgias.  Skin: Negative for color change, rash, hair loss, nodules/bumps, skin  tightness, ulcers and sensitivity to sunlight.  Allergic/Immunologic: Negative for susceptible to infections.  Neurological: Negative for dizziness, fainting, memory loss and night sweats.  Hematological: Negative for swollen glands.  Psychiatric/Behavioral: Negative for depressed mood and sleep disturbance. The patient is not nervous/anxious.     PMFS History:  Patient Active Problem List   Diagnosis Date Noted  . Renal cell carcinoma ([place]) 08/29/2016  . History of COPD 08/29/2016  . Rheumatoid nodulosis ([place]) 08/24/2016  . High risk medication use 08/24/2016  . Thrombocytosis ([place]) 08/24/2016  . Elevated LFTs mild elevation of ALT  08/24/2016  . History of renal cell carcinoma 08/24/2016  . History of prostate cancer 08/24/2016  . History of lung cancer 08/24/2016  . History of adrenal insufficiency 08/24/2016  . HCAP (healthcare-associated pneumonia) 06/27/2011    Class: Acute  . Seropositive rheumatoid arthritis ([place]) 06/27/2011  . Panhypopituitarism ([place]) 06/27/2011  . Degenerative joint disease 06/27/2011  . History of tobacco use 06/27/2011  . Hyperlipidemia 06/27/2011    Past Medical History:  Diagnosis Date  . Arthritis   . Benign tumor of pituitary gland ([place])    "wasn't able to get it all"  . Pneumonia 06/27/11   "first time"  . Shingles     Family History  Problem Relation Age of Onset  . Heart disease Mother   . Cancer Brother   . Heart disease Brother    Past Surgical History:  Procedure Laterality Date  . CHOLECYSTECTOMY OPEN  2010  . KIDNEY SURGERY  2011   "lesion removed right side"  . RENAL BIOPSY  2012  right  . TRANSPHENOIDAL / TRANSNASAL HYPOPHYSECTOMY / RESECTION PITUITARY TUMOR  ~ 1979   "they couldn't get it all"   Social History   Social History Narrative  . Not on file     Objective: Vital Signs: BP 132/76 (BP Location: Left Arm, Patient Position: Sitting, Cuff Size: Normal)   Pulse 83   Resp 17   Ht 5' 8.5" (1.74 m)   Wt 166  lb (75.3 kg)   BMI 24.87 kg/m    Physical Exam  Constitutional: He is oriented to person, place, and time. He appears well-developed and well-nourished.  HENT:  Head: Normocephalic and atraumatic.  Eyes: Conjunctivae and EOM are normal. Pupils are equal, round, and reactive to light.  Neck: Normal range of motion. Neck supple.  Cardiovascular: Normal rate, regular rhythm and normal heart sounds.  Pulmonary/Chest: Effort normal and breath sounds normal.  Abdominal: Soft. Bowel sounds are normal.  Neurological: He is alert and oriented to person, place, and time.  Skin: Skin is warm and dry. Capillary refill takes less than 2 seconds.  Psychiatric: He has a normal mood and affect. His behavior is normal.  Nursing note and vitals reviewed.    Musculoskeletal Exam: C-spine and thoracic spine good range of motion. He has thoracic kyphosis. Shoulder joints good range of motion. He has right elbow joint contracture. Bilateral wrists joint extension was 30 and flexion about 50. He has synovial thickening over bilateral wrist joints bilateral MCP joints he has subluxation of all of his left hand at all MCP joints. Hip joints and knee joints are good range of motion. He has some thickening of his left ankle joint. He has severe arthritic changes in his left foot with overcrowding of toes and subluxation. He had postsurgical changes in his right foot from reconstruction surgery.    CDAI Exam: CDAI Homunculus Exam:   Joint Counts:  CDAI Tender Joint count: 0 CDAI Swollen Joint count: 0  Global Assessments:  Patient Global Assessment: 5 Provider Global Assessment: 4  CDAI Calculated Score: 9    Investigation: No additional findings. CBC Latest Ref Rng & Units 03/04/2017 11/23/2016 08/29/2016  WBC 3.8 - 10.8 Thousand/uL 9.8 7.8 6.9  Hemoglobin 13.2 - 17.1 g/dL 14.5 14.8 15.1  Hematocrit 38.5 - 50.0 % 43.7 43.4 46.5  Platelets 140 - 400 Thousand/uL 400 378 427(H)   CMP Latest Ref Rng &  Units 03/04/2017 11/23/2016 08/29/2016  Glucose 65 - 99 mg/dL 92 131(H) 91  BUN 7 - 25 mg/dL 19 23 14   Creatinine 0.70 - 1.11 mg/dL 1.07 1.25(H) 0.93  Sodium 135 - 146 mmol/L 140 138 141  Potassium 3.5 - 5.3 mmol/L 4.8 4.9 5.0  Chloride 98 - 110 mmol/L 103 102 103  CO2 20 - 32 mmol/L 31 29 25   Calcium 8.6 - 10.3 mg/dL 9.3 9.1 9.6  Total Protein 6.1 - 8.1 g/dL 6.2 6.5 6.8  Total Bilirubin 0.2 - 1.2 mg/dL 0.8 0.5 0.9  Alkaline Phos 40 - 115 U/L - - 108  AST 10 - 35 U/L 32 20 41(H)  ALT 9 - 46 U/L 19 14 50(H)    Imaging: No results found.  Speciality Comments: No specialty comments available.    Procedures:  No procedures performed Allergies: Patient has no known allergies.   Assessment / Plan:     Visit Diagnoses: Seropositive rheumatoid arthritis ([place]) - +RF. Severe erosive disease with subluxation in multiple joints. He is been on methotrexate for 25 years. This methotrexate dose was recently  reduced due to elevation of LFTs and elevation in creatinine. He states he started having increased joint pain and stiffness since she's been on reduced dose of methotrexate. He increase the dose of methotrexate himself to 8 tablets per week after his last labs which were normal. He has not noticed significant improvement but he is getting better gradually. I would like to check labs today. We also discussed adding Plaquenil in case we have to decrease his methotrexate again. Indications side effects contraindications were discussed at length. I handout was given. He was in agreement to proceed with Plaquenil in case we have to reduce his methotrexate.  Patient was counseled on the purpose, proper use, and adverse effects of hydroxychloroquine including nausea/diarrhea, skin rash, headaches, and sun sensitivity.  Discussed importance of annual eye exams while on hydroxychloroquine to monitor to ocular toxicity and discussed importance of frequent laboratory monitoring.  Provided patient with eye exam  form for baseline ophthalmologic exam.  Provided patient with educational materials on hydroxychloroquine and answered all questions.  Patient consented to hydroxychloroquine.  Will upload consent in the media tab.     Rheumatoid nodulosis ([facility]) - He has multiple nodules on his bilateral hands.  High risk medication use - Methotrexate 8 tabs po qwk, he takes it divided twice a week., folic acid 2 mg po qd - Plan: COMPLETE METABOLIC PANEL WITH GFR  Thrombocytosis ([place]): Stable  Panhypopituitarism ([facility]) - Taking bromocriptine  History of adrenal insufficiency - He is on cortisone. Followed up by PCP  History of COPD - Emphysema was noted on the chest x-ray  History of lung cancer  History of prostate cancer  History of hyperlipidemia - He is on statin therapy  History of peptic ulcer disease  History of kidney stones  History of renal cell carcinoma  History of tobacco use  History of kyphosis - Patient has significant kyphosis. He states his bone density was normal in 2016    Orders: Orders Placed This Encounter  Procedures  . COMPLETE METABOLIC PANEL WITH GFR   No orders of the defined types were placed in this encounter.   Face-to-face time spent with patient was 30 minutes. Greater than 50% of time was spent in counseling and coordination of care.  Follow-Up Instructions: Return in about 5 months (around 08/30/2017) for Rheumatoid arthritis.   Bo Merino, MD  Note - This record has been created using Editor, commissioning.  Chart creation errors have been sought, but may not always  have been located. Such creation errors do not reflect on  the standard of medical care.

## 2017-04-01 ENCOUNTER — Encounter: Payer: Self-pay | Admitting: Rheumatology

## 2017-04-01 ENCOUNTER — Ambulatory Visit (INDEPENDENT_AMBULATORY_CARE_PROVIDER_SITE_OTHER): Payer: Medicare Other | Admitting: Rheumatology

## 2017-04-01 VITALS — BP 132/76 | HR 83 | Resp 17 | Ht 68.5 in | Wt 166.0 lb

## 2017-04-01 DIAGNOSIS — M0630 Rheumatoid nodule, unspecified site: Secondary | ICD-10-CM | POA: Diagnosis not present

## 2017-04-01 DIAGNOSIS — M059 Rheumatoid arthritis with rheumatoid factor, unspecified: Secondary | ICD-10-CM

## 2017-04-01 DIAGNOSIS — Z87891 Personal history of nicotine dependence: Secondary | ICD-10-CM

## 2017-04-01 DIAGNOSIS — D473 Essential (hemorrhagic) thrombocythemia: Secondary | ICD-10-CM | POA: Diagnosis not present

## 2017-04-01 DIAGNOSIS — Z8709 Personal history of other diseases of the respiratory system: Secondary | ICD-10-CM

## 2017-04-01 DIAGNOSIS — Z87442 Personal history of urinary calculi: Secondary | ICD-10-CM

## 2017-04-01 DIAGNOSIS — Z8711 Personal history of peptic ulcer disease: Secondary | ICD-10-CM | POA: Diagnosis not present

## 2017-04-01 DIAGNOSIS — E230 Hypopituitarism: Secondary | ICD-10-CM

## 2017-04-01 DIAGNOSIS — Z8639 Personal history of other endocrine, nutritional and metabolic disease: Secondary | ICD-10-CM | POA: Diagnosis not present

## 2017-04-01 DIAGNOSIS — Z85528 Personal history of other malignant neoplasm of kidney: Secondary | ICD-10-CM | POA: Diagnosis not present

## 2017-04-01 DIAGNOSIS — Z8546 Personal history of malignant neoplasm of prostate: Secondary | ICD-10-CM

## 2017-04-01 DIAGNOSIS — Z85118 Personal history of other malignant neoplasm of bronchus and lung: Secondary | ICD-10-CM | POA: Diagnosis not present

## 2017-04-01 DIAGNOSIS — Z8739 Personal history of other diseases of the musculoskeletal system and connective tissue: Secondary | ICD-10-CM

## 2017-04-01 DIAGNOSIS — D75839 Thrombocytosis, unspecified: Secondary | ICD-10-CM

## 2017-04-01 DIAGNOSIS — Z79899 Other long term (current) drug therapy: Secondary | ICD-10-CM | POA: Diagnosis not present

## 2017-04-01 LAB — COMPLETE METABOLIC PANEL WITH GFR
AG Ratio: 1.5 (calc) (ref 1.0–2.5)
ALT: 19 U/L (ref 9–46)
AST: 31 U/L (ref 10–35)
Albumin: 4 g/dL (ref 3.6–5.1)
Alkaline phosphatase (APISO): 62 U/L (ref 40–115)
BILIRUBIN TOTAL: 0.6 mg/dL (ref 0.2–1.2)
BUN: 16 mg/dL (ref 7–25)
CO2: 31 mmol/L (ref 20–32)
Calcium: 9.9 mg/dL (ref 8.6–10.3)
Chloride: 101 mmol/L (ref 98–110)
Creat: 0.86 mg/dL (ref 0.70–1.11)
GFR, EST AFRICAN AMERICAN: 94 mL/min/{1.73_m2} (ref >=60)
GFR, EST NON AFRICAN AMERICAN: 81 mL/min/{1.73_m2} (ref >=60)
GLOBULIN: 2.6 g/dL (ref 1.9–3.7)
Glucose, Bld: 102 mg/dL — ABNORMAL HIGH (ref 65–99)
Potassium: 5.3 mmol/L (ref 3.5–5.3)
Sodium: 139 mmol/L (ref 135–146)
Total Protein: 6.6 g/dL (ref 6.1–8.1)

## 2017-04-01 NOTE — Patient Instructions (Signed)

## 2017-04-02 NOTE — Progress Notes (Signed)
WNL

## 2017-04-17 DIAGNOSIS — E298 Other testicular dysfunction: Secondary | ICD-10-CM | POA: Diagnosis not present

## 2017-05-01 DIAGNOSIS — E298 Other testicular dysfunction: Secondary | ICD-10-CM | POA: Diagnosis not present

## 2017-05-15 DIAGNOSIS — E291 Testicular hypofunction: Secondary | ICD-10-CM | POA: Diagnosis not present

## 2017-05-29 DIAGNOSIS — E291 Testicular hypofunction: Secondary | ICD-10-CM | POA: Diagnosis not present

## 2017-06-04 ENCOUNTER — Ambulatory Visit: Payer: Medicare Other | Admitting: Rheumatology

## 2017-06-10 DIAGNOSIS — L570 Actinic keratosis: Secondary | ICD-10-CM | POA: Diagnosis not present

## 2017-06-10 DIAGNOSIS — L820 Inflamed seborrheic keratosis: Secondary | ICD-10-CM | POA: Diagnosis not present

## 2017-06-10 DIAGNOSIS — L821 Other seborrheic keratosis: Secondary | ICD-10-CM | POA: Diagnosis not present

## 2017-06-19 DIAGNOSIS — E291 Testicular hypofunction: Secondary | ICD-10-CM | POA: Diagnosis not present

## 2017-07-10 DIAGNOSIS — R82998 Other abnormal findings in urine: Secondary | ICD-10-CM | POA: Diagnosis not present

## 2017-07-10 DIAGNOSIS — M810 Age-related osteoporosis without current pathological fracture: Secondary | ICD-10-CM | POA: Diagnosis not present

## 2017-07-10 DIAGNOSIS — E038 Other specified hypothyroidism: Secondary | ICD-10-CM | POA: Diagnosis not present

## 2017-07-10 DIAGNOSIS — E7849 Other hyperlipidemia: Secondary | ICD-10-CM | POA: Diagnosis not present

## 2017-07-10 DIAGNOSIS — E278 Other specified disorders of adrenal gland: Secondary | ICD-10-CM | POA: Diagnosis not present

## 2017-07-10 DIAGNOSIS — E291 Testicular hypofunction: Secondary | ICD-10-CM | POA: Diagnosis not present

## 2017-07-10 DIAGNOSIS — Z125 Encounter for screening for malignant neoplasm of prostate: Secondary | ICD-10-CM | POA: Diagnosis not present

## 2017-07-24 DIAGNOSIS — E291 Testicular hypofunction: Secondary | ICD-10-CM | POA: Diagnosis not present

## 2017-08-02 DIAGNOSIS — E7849 Other hyperlipidemia: Secondary | ICD-10-CM | POA: Diagnosis not present

## 2017-08-02 DIAGNOSIS — E278 Other specified disorders of adrenal gland: Secondary | ICD-10-CM | POA: Diagnosis not present

## 2017-08-02 DIAGNOSIS — Z6823 Body mass index (BMI) 23.0-23.9, adult: Secondary | ICD-10-CM | POA: Diagnosis not present

## 2017-08-02 DIAGNOSIS — M069 Rheumatoid arthritis, unspecified: Secondary | ICD-10-CM | POA: Diagnosis not present

## 2017-08-02 DIAGNOSIS — Z0000 Encounter for general adult medical examination without abnormal findings: Secondary | ICD-10-CM | POA: Diagnosis not present

## 2017-08-02 DIAGNOSIS — K279 Peptic ulcer, site unspecified, unspecified as acute or chronic, without hemorrhage or perforation: Secondary | ICD-10-CM | POA: Diagnosis not present

## 2017-08-02 DIAGNOSIS — Z1389 Encounter for screening for other disorder: Secondary | ICD-10-CM | POA: Diagnosis not present

## 2017-08-02 DIAGNOSIS — E230 Hypopituitarism: Secondary | ICD-10-CM | POA: Diagnosis not present

## 2017-08-02 DIAGNOSIS — C649 Malignant neoplasm of unspecified kidney, except renal pelvis: Secondary | ICD-10-CM | POA: Diagnosis not present

## 2017-08-02 DIAGNOSIS — M810 Age-related osteoporosis without current pathological fracture: Secondary | ICD-10-CM | POA: Diagnosis not present

## 2017-08-02 DIAGNOSIS — E291 Testicular hypofunction: Secondary | ICD-10-CM | POA: Diagnosis not present

## 2017-08-02 DIAGNOSIS — Z6379 Other stressful life events affecting family and household: Secondary | ICD-10-CM | POA: Diagnosis not present

## 2017-08-15 ENCOUNTER — Other Ambulatory Visit: Payer: Self-pay | Admitting: Rheumatology

## 2017-08-15 NOTE — Telephone Encounter (Signed)
Last Visit: 04/01/17 Next Visit: 08/28/17  Okay to refill per Dr. Estanislado Pandy

## 2017-08-19 NOTE — Progress Notes (Signed)
Office Visit Note  Patient: Ryan Coffey             Date of Birth: 1932/09/15           MRN: [id number]             PCP: Prince Solian, MD Referring: Prince Solian, MD Visit Date: 08/28/2017 Occupation: @GUAROCC @    Subjective:  Medication monitoring   History of Present Illness: BIRDIE BEVERIDGE is a 84 y.o. male with history of seropositive rheumatoid arthritis.  Patient is on methotrexate 8 tablets by mouth weekly he divides his dose into twice a week, 4 tablets on Saturdays and 4 tablets on Sundays.  he is also on folic acid 2 mg daily.  He would not like to make any changes in his medications at this time.  He does not need any refills.  He denies any joint pain or joint swelling at this time.  He denies any joint stiffness.  He denies any recent flares.  He denies any concerns at this time.  He denies any recent new rheumatoid nodules.  He denies any tenderness of his current nodules.    Activities of Daily Living:  Patient reports morning stiffness for 0 minute.   Patient Denies nocturnal pain.  Difficulty dressing/grooming: Denies Difficulty climbing stairs: Denies Difficulty getting out of chair: Denies Difficulty using hands for taps, buttons, cutlery, and/or writing: Denies   Review of Systems  Constitutional: Negative for fatigue, fever and night sweats.  HENT: Negative for ear pain, mouth sores, mouth dryness and nose dryness.   Eyes: Negative for pain, redness, itching and dryness.  Respiratory: Negative for cough, shortness of breath and difficulty breathing.   Cardiovascular: Negative for chest pain, palpitations, hypertension, irregular heartbeat and swelling in legs/feet.  Gastrointestinal: Negative for blood in stool, constipation and diarrhea.  Endocrine: Negative for increased urination.  Genitourinary: Negative for painful urination.  Musculoskeletal: Positive for arthralgias, joint pain and joint swelling. Negative for myalgias, muscle weakness,  morning stiffness, muscle tenderness and myalgias.  Skin: Negative for color change, rash, hair loss, nodules/bumps, skin tightness, ulcers and sensitivity to sunlight.  Allergic/Immunologic: Negative for susceptible to infections.  Neurological: Negative for dizziness, fainting, memory loss, night sweats and weakness.  Hematological: Negative for swollen glands.  Psychiatric/Behavioral: Negative for depressed mood and sleep disturbance. The patient is not nervous/anxious.     PMFS History:  Patient Active Problem List   Diagnosis Date Noted  . Renal cell carcinoma ([place]) 08/29/2016  . History of COPD 08/29/2016  . Rheumatoid nodulosis ([place]) 08/24/2016  . High risk medication use 08/24/2016  . Thrombocytosis ([place]) 08/24/2016  . Elevated LFTs mild elevation of ALT  08/24/2016  . History of renal cell carcinoma 08/24/2016  . History of prostate cancer 08/24/2016  . History of lung cancer 08/24/2016  . History of adrenal insufficiency 08/24/2016  . HCAP (healthcare-associated pneumonia) 06/27/2011    Class: Acute  . Seropositive rheumatoid arthritis ([place]) 06/27/2011  . Panhypopituitarism ([place]) 06/27/2011  . Degenerative joint disease 06/27/2011  . History of tobacco use 06/27/2011  . Hyperlipidemia 06/27/2011    Past Medical History:  Diagnosis Date  . Arthritis   . Benign tumor of pituitary gland ([place])    "wasn't able to get it all"  . Pneumonia 06/27/11   "first time"  . Shingles     Family History  Problem Relation Age of Onset  . Heart disease Mother   . Cancer Brother   . Heart disease Brother  Past Surgical History:  Procedure Laterality Date  . CHOLECYSTECTOMY OPEN  2010  . KIDNEY SURGERY  2011   "lesion removed right side"  . RENAL BIOPSY  2012   right  . TRANSPHENOIDAL / TRANSNASAL HYPOPHYSECTOMY / RESECTION PITUITARY TUMOR  ~ 1979   "they couldn't get it all"   Social History   Social History Narrative  . Not on file     Objective: Vital Signs: BP  (!) 146/82 (BP Location: Left Arm, Patient Position: Sitting, Cuff Size: Normal)   Pulse 64   Ht 5\' 8"  (1.727 m)   Wt 166 lb (75.3 kg)   BMI 25.24 kg/m    Physical Exam  Constitutional: He is oriented to person, place, and time. He appears well-developed and well-nourished.  HENT:  Head: Normocephalic and atraumatic.  Eyes: Pupils are equal, round, and reactive to light. Conjunctivae and EOM are normal.  Neck: Normal range of motion. Neck supple.  Cardiovascular: Normal rate, regular rhythm and normal heart sounds.  Pulmonary/Chest: Effort normal and breath sounds normal.  Abdominal: Soft. Bowel sounds are normal.  Lymphadenopathy:    He has no cervical adenopathy.  Neurological: He is alert and oriented to person, place, and time.  Skin: Skin is warm and dry. Capillary refill takes less than 2 seconds.  Psychiatric: He has a normal mood and affect. His behavior is normal.  Nursing note and vitals reviewed.    Musculoskeletal Exam: C-spine slightly limited ROM.  Thoracic kyphosis.  Limited ROM of lumbar spine.  No midline spinal tenderness.  No SI joint tenderness.  Shoulder joints good ROM with no discomfort.  Right elbow joints contracture.  Limited ROM of bilateral wrist joints.  Synovial thickening of bilateral wrists and MCP joints.  Ulnar deviation of all left MCP joints.  Synovitis of right second MCP and right third PIP joint.  Hip joints, knee joints good range of motion with no synovitis.  No warmth or effusion of bilateral knee joints.  No tenderness of trochanteric bursa bilaterally.  He has synovial thickening of bilateral ankle joints.  He has overcrowding and osteoarthritic changes of bilateral feet.  CDAI Exam: CDAI Homunculus Exam:   Tenderness:  Right hand: 2nd MCP and 3rd PIP  Swelling:  Right hand: 2nd MCP and 3rd PIP  Joint Counts:  CDAI Tender Joint count: 2 CDAI Swollen Joint count: 2  Global Assessments:  Patient Global Assessment: 0 Provider Global  Assessment: 0    Investigation: No additional findings. CBC Latest Ref Rng & Units 03/04/2017 11/23/2016 08/29/2016  WBC 3.8 - 10.8 Thousand/uL 9.8 7.8 6.9  Hemoglobin 13.2 - 17.1 g/dL 14.5 14.8 15.1  Hematocrit 38.5 - 50.0 % 43.7 43.4 46.5  Platelets 140 - 400 Thousand/uL 400 378 427(H)   CMP Latest Ref Rng & Units 04/01/2017 03/04/2017 11/23/2016  Glucose 65 - 99 mg/dL 102(H) 92 131(H)  BUN 7 - 25 mg/dL 16 19 23   Creatinine 0.70 - 1.11 mg/dL 0.86 1.07 1.25(H)  Sodium 135 - 146 mmol/L 139 140 138  Potassium 3.5 - 5.3 mmol/L 5.3 4.8 4.9  Chloride 98 - 110 mmol/L 101 103 102  CO2 20 - 32 mmol/L 31 31 29   Calcium 8.6 - 10.3 mg/dL 9.9 9.3 9.1  Total Protein 6.1 - 8.1 g/dL 6.6 6.2 6.5  Total Bilirubin 0.2 - 1.2 mg/dL 0.6 0.8 0.5  Alkaline Phos 40 - 115 U/L - - -  AST 10 - 35 U/L 31 32 20  ALT 9 - 46 U/L 19  19 14     Imaging: No results found.  Speciality Comments: No specialty comments available.    Procedures:  No procedures performed Allergies: Patient has no known allergies.   Assessment / Plan:     Visit Diagnoses: Seropositive rheumatoid arthritis ([place]) - +RF. Severe erosive disease with subluxation in multiple joints. He is been on methotrexate for 25 years: He continues to have active synovitis in the right 2nd MCP and right 3rd PIP joint.  he has no tenderness on exam.   He would not like to make any changes to his medications at this time.  He will continue taking methotrexate 4 tablets by mouth on Saturdays and 4 tablets by mouth on Sundays.  He does not need any refills of methotrexate or folic acid at this time.  His LFTs were within normal limits on 07/10/2017.  He would like to stay on methotrexate and would not like to add Plaquenil at this time.  He will continue getting lab work at his PCP office.  Rheumatoid nodulosis [facility]): He has multiple nodules on bilateral hands.  Nontender.  He has not noticed any new nodules.  High risk medication use - Methotrexate 8 tabs  po qwk, he takes it divided twice a week., folic acid 2 mg po qd  -he had CBC and CMP drawn on 07/10/2017 by his PCP.  LFTs were within normal limits.  He will have CBC and CMP drawn by his PCP in August and every 3 months.  Standing orders are in place in case he comes to our office for lab work.  Plan: COMPLETE METABOLIC PANEL WITH GFR, CBC with Differential/Platelet  Thrombocytosis ([place]): Platelets are within normal limits on 07/10/2017  Other medical conditions are listed as follows:  Panhypopituitarism ([place]) - on bromocriptine  History of adrenal insufficiency  History of COPD  History of lung cancer  History of prostate cancer  History of hyperlipidemia  History of peptic ulcer disease  History of kidney stones  History of renal cell carcinoma  History of tobacco use  History of kyphosis    Orders: Orders Placed This Encounter  Procedures  . COMPLETE METABOLIC PANEL WITH GFR  . CBC with Differential/Platelet   No orders of the defined types were placed in this encounter.     Follow-Up Instructions: Return in about 5 months (around 01/28/2018) for Rheumatoid arthritis.   Ofilia Neas, PA-C   I examined and evaluated the patient with Hazel Sams PA.  Patient had mild synovitis in his MCPs and PIPs on my exam as described above.  The plan of care was discussed as noted above.  Bo Merino, MD Note - This record has been created using Editor, commissioning.  Chart creation errors have been sought, but may not always  have been located. Such creation errors do not reflect on  the standard of medical care.

## 2017-08-21 DIAGNOSIS — E298 Other testicular dysfunction: Secondary | ICD-10-CM | POA: Diagnosis not present

## 2017-08-26 ENCOUNTER — Other Ambulatory Visit: Payer: Self-pay | Admitting: Rheumatology

## 2017-08-26 NOTE — Telephone Encounter (Signed)
Last Visit: 04/01/17 Next Visit: 08/28/17 Labs: 03/04/17 WNL

## 2017-08-27 ENCOUNTER — Telehealth: Payer: Self-pay | Admitting: Rheumatology

## 2017-08-27 NOTE — Telephone Encounter (Signed)
Left message to advise patient he due to update labs.

## 2017-08-27 NOTE — Telephone Encounter (Signed)
Patient advised we have not received labs from Dr. Dagmar Hait. Patient will contact their office and have them fax it.

## 2017-08-27 NOTE — Telephone Encounter (Signed)
See previous phone note.  

## 2017-08-27 NOTE — Telephone Encounter (Signed)
Patient called stating he had his labwork done at his appointment with Dr. Dagmar Hait at [facility] a couple of weeks ago.  Patient states he requested the office send a copy of the results to Dr. Estanislado Pandy.

## 2017-08-27 NOTE — Telephone Encounter (Signed)
Patient left a voicemail stating he was returning your call regarding his labs.

## 2017-08-28 ENCOUNTER — Ambulatory Visit (INDEPENDENT_AMBULATORY_CARE_PROVIDER_SITE_OTHER): Payer: Medicare Other | Admitting: Rheumatology

## 2017-08-28 ENCOUNTER — Encounter: Payer: Self-pay | Admitting: Rheumatology

## 2017-08-28 VITALS — BP 146/82 | HR 64 | Ht 68.0 in | Wt 166.0 lb

## 2017-08-28 DIAGNOSIS — Z8546 Personal history of malignant neoplasm of prostate: Secondary | ICD-10-CM

## 2017-08-28 DIAGNOSIS — Z85528 Personal history of other malignant neoplasm of kidney: Secondary | ICD-10-CM | POA: Diagnosis not present

## 2017-08-28 DIAGNOSIS — D75839 Thrombocytosis, unspecified: Secondary | ICD-10-CM

## 2017-08-28 DIAGNOSIS — E230 Hypopituitarism: Secondary | ICD-10-CM

## 2017-08-28 DIAGNOSIS — Z87891 Personal history of nicotine dependence: Secondary | ICD-10-CM

## 2017-08-28 DIAGNOSIS — Z85118 Personal history of other malignant neoplasm of bronchus and lung: Secondary | ICD-10-CM

## 2017-08-28 DIAGNOSIS — Z87442 Personal history of urinary calculi: Secondary | ICD-10-CM

## 2017-08-28 DIAGNOSIS — Z8711 Personal history of peptic ulcer disease: Secondary | ICD-10-CM | POA: Diagnosis not present

## 2017-08-28 DIAGNOSIS — Z8639 Personal history of other endocrine, nutritional and metabolic disease: Secondary | ICD-10-CM

## 2017-08-28 DIAGNOSIS — M0630 Rheumatoid nodule, unspecified site: Secondary | ICD-10-CM | POA: Diagnosis not present

## 2017-08-28 DIAGNOSIS — Z79899 Other long term (current) drug therapy: Secondary | ICD-10-CM

## 2017-08-28 DIAGNOSIS — M059 Rheumatoid arthritis with rheumatoid factor, unspecified: Secondary | ICD-10-CM | POA: Diagnosis not present

## 2017-08-28 DIAGNOSIS — D473 Essential (hemorrhagic) thrombocythemia: Secondary | ICD-10-CM

## 2017-08-28 DIAGNOSIS — Z8739 Personal history of other diseases of the musculoskeletal system and connective tissue: Secondary | ICD-10-CM

## 2017-08-28 DIAGNOSIS — Z8709 Personal history of other diseases of the respiratory system: Secondary | ICD-10-CM | POA: Diagnosis not present

## 2017-08-28 NOTE — Patient Instructions (Addendum)
Standing Labs We placed an order today for your standing lab work.    Please come back and get your standing labs in August and every 3 months   CBC and CMP   We have open lab Monday through Friday from 8:30-11:30 AM and 1:30-4:00 PM  at the office of Dr. Bo Merino.   You may experience shorter wait times on Monday and Friday afternoons. The office is located at [address] [place], [place], [place] [address] No appointment is necessary.   Labs are drawn by [facility].  You may receive a bill from [facility] for your lab work. If you have any questions regarding directions or hours of operation,  please call [phone].

## 2017-09-03 DIAGNOSIS — E298 Other testicular dysfunction: Secondary | ICD-10-CM | POA: Diagnosis not present

## 2017-09-06 ENCOUNTER — Telehealth: Payer: Self-pay

## 2017-09-06 MED ORDER — METHOTREXATE 2.5 MG PO TABS: ORAL_TABLET | ORAL | 0 refills | Status: DC

## 2017-09-06 NOTE — Telephone Encounter (Signed)
Refill request received via fax.   Last visit: 08/28/2017 Next visit: 02/05/2018 Labs: 07/10/2017  Okay to refill per Dr. Estanislado Pandy.

## 2017-09-11 DIAGNOSIS — M79671 Pain in right foot: Secondary | ICD-10-CM | POA: Diagnosis not present

## 2017-09-11 DIAGNOSIS — M06371 Rheumatoid nodule, right ankle and foot: Secondary | ICD-10-CM | POA: Diagnosis not present

## 2017-09-19 DIAGNOSIS — E298 Other testicular dysfunction: Secondary | ICD-10-CM | POA: Diagnosis not present

## 2017-10-01 DIAGNOSIS — L97519 Non-pressure chronic ulcer of other part of right foot with unspecified severity: Secondary | ICD-10-CM | POA: Diagnosis not present

## 2017-10-02 DIAGNOSIS — E298 Other testicular dysfunction: Secondary | ICD-10-CM | POA: Diagnosis not present

## 2017-10-06 DIAGNOSIS — M19071 Primary osteoarthritis, right ankle and foot: Secondary | ICD-10-CM | POA: Diagnosis not present

## 2017-10-06 DIAGNOSIS — L97519 Non-pressure chronic ulcer of other part of right foot with unspecified severity: Secondary | ICD-10-CM | POA: Diagnosis not present

## 2017-10-06 DIAGNOSIS — M19072 Primary osteoarthritis, left ankle and foot: Secondary | ICD-10-CM | POA: Diagnosis not present

## 2017-10-14 DIAGNOSIS — J209 Acute bronchitis, unspecified: Secondary | ICD-10-CM | POA: Diagnosis not present

## 2017-10-14 DIAGNOSIS — J069 Acute upper respiratory infection, unspecified: Secondary | ICD-10-CM | POA: Diagnosis not present

## 2017-10-16 DIAGNOSIS — E298 Other testicular dysfunction: Secondary | ICD-10-CM | POA: Diagnosis not present

## 2017-10-30 DIAGNOSIS — E291 Testicular hypofunction: Secondary | ICD-10-CM | POA: Diagnosis not present

## 2017-11-13 DIAGNOSIS — Z6823 Body mass index (BMI) 23.0-23.9, adult: Secondary | ICD-10-CM | POA: Diagnosis not present

## 2017-11-13 DIAGNOSIS — E291 Testicular hypofunction: Secondary | ICD-10-CM | POA: Diagnosis not present

## 2017-11-13 DIAGNOSIS — R05 Cough: Secondary | ICD-10-CM | POA: Diagnosis not present

## 2017-11-25 ENCOUNTER — Other Ambulatory Visit: Payer: Self-pay | Admitting: Rheumatology

## 2017-11-25 NOTE — Telephone Encounter (Addendum)
Last Visit: 08/28/17 Next Visit: 02/05/18 Labs: 08/13/17 WNL  Attempted to contact patient and left message to advis patein he is due for labs.  Okay to refill 30 day supply per Dr. Rob Hickman

## 2017-11-26 ENCOUNTER — Other Ambulatory Visit: Payer: Self-pay | Admitting: *Deleted

## 2017-11-26 DIAGNOSIS — Z79899 Other long term (current) drug therapy: Secondary | ICD-10-CM | POA: Diagnosis not present

## 2017-11-26 LAB — CBC WITH DIFFERENTIAL/PLATELET
BASOS ABS: 85 {cells}/uL (ref 0–200)
Basophils Relative: 0.7 %
EOS ABS: 266 {cells}/uL (ref 15–500)
Eosinophils Relative: 2.2 %
HCT: 43 % (ref 38.5–50.0)
Hemoglobin: 14.6 g/dL (ref 13.2–17.1)
Lymphs Abs: 1513 cells/uL (ref 850–3900)
MCH: 33.2 pg — AB (ref 27.0–33.0)
MCHC: 34 g/dL (ref 32.0–36.0)
MCV: 97.7 fL (ref 80.0–100.0)
MPV: 9.8 fL (ref 7.5–12.5)
Monocytes Relative: 6.1 %
NEUTROS PCT: 78.5 %
Neutro Abs: 9499 cells/uL — ABNORMAL HIGH (ref 1500–7800)
Platelets: 417 10*3/uL — ABNORMAL HIGH (ref 140–400)
RBC: 4.4 10*6/uL (ref 4.20–5.80)
RDW: 13.9 % (ref 11.0–15.0)
Total Lymphocyte: 12.5 %
WBC mixed population: 738 cells/uL (ref 200–950)
WBC: 12.1 10*3/uL — ABNORMAL HIGH (ref 3.8–10.8)

## 2017-11-26 LAB — COMPLETE METABOLIC PANEL WITH GFR
AG RATIO: 1.8 (calc) (ref 1.0–2.5)
ALT: 17 U/L (ref 9–46)
AST: 24 U/L (ref 10–35)
Albumin: 4 g/dL (ref 3.6–5.1)
Alkaline phosphatase (APISO): 61 U/L (ref 40–115)
BILIRUBIN TOTAL: 0.8 mg/dL (ref 0.2–1.2)
BUN: 21 mg/dL (ref 7–25)
CHLORIDE: 99 mmol/L (ref 98–110)
CO2: 29 mmol/L (ref 20–32)
Calcium: 9.7 mg/dL (ref 8.6–10.3)
Creat: 0.99 mg/dL (ref 0.70–1.11)
GFR, EST AFRICAN AMERICAN: 82 mL/min/{1.73_m2} (ref >=60)
GFR, Est Non African American: 71 mL/min/{1.73_m2} (ref >=60)
Globulin: 2.2 g/dL (calc) (ref 1.9–3.7)
Glucose, Bld: 115 mg/dL — ABNORMAL HIGH (ref 65–99)
POTASSIUM: 4.9 mmol/L (ref 3.5–5.3)
Sodium: 137 mmol/L (ref 135–146)
TOTAL PROTEIN: 6.2 g/dL (ref 6.1–8.1)

## 2017-11-27 ENCOUNTER — Other Ambulatory Visit: Payer: Self-pay | Admitting: Rheumatology

## 2017-11-27 DIAGNOSIS — E298 Other testicular dysfunction: Secondary | ICD-10-CM | POA: Diagnosis not present

## 2017-11-27 NOTE — Progress Notes (Signed)
WBC count elevated.  Please ask patient if he has had any recent infections or has been on prednisone recently.  If he has signs and symptoms of an infection he should follow up with PCP and hold MTX.   Glucose is 115. All other labs are stable.

## 2017-11-28 ENCOUNTER — Other Ambulatory Visit: Payer: Self-pay | Admitting: Rheumatology

## 2017-11-29 ENCOUNTER — Telehealth: Payer: Self-pay | Admitting: Rheumatology

## 2017-11-29 NOTE — Telephone Encounter (Signed)
Patient has called a second time for lab results.

## 2017-11-29 NOTE — Telephone Encounter (Signed)
Patient advised WBC count elevated. Please ask patient if he has had any recent infections or has been on prednisone recently. If he has signs and symptoms of an infection he should follow up with PCP and hold MTX. Glucose is 115. All other labs are stable. Patient states he not been on prednisone recently but has recently been on antibiotics. Patient still has a cough. Patient advised to hold MTX until completely well. Patient verbalized understanding.

## 2017-11-29 NOTE — Telephone Encounter (Signed)
Spoke with patient and he asked for labs to be faxed to PCP. Faxed labs as requested.

## 2017-11-29 NOTE — Telephone Encounter (Signed)
Patient called stating he was returning your call regarding his bloodwork results.

## 2017-12-01 ENCOUNTER — Other Ambulatory Visit: Payer: Self-pay | Admitting: Rheumatology

## 2017-12-02 DIAGNOSIS — Z6823 Body mass index (BMI) 23.0-23.9, adult: Secondary | ICD-10-CM | POA: Diagnosis not present

## 2017-12-02 DIAGNOSIS — J449 Chronic obstructive pulmonary disease, unspecified: Secondary | ICD-10-CM | POA: Diagnosis not present

## 2017-12-02 DIAGNOSIS — M069 Rheumatoid arthritis, unspecified: Secondary | ICD-10-CM | POA: Diagnosis not present

## 2017-12-02 DIAGNOSIS — E278 Other specified disorders of adrenal gland: Secondary | ICD-10-CM | POA: Diagnosis not present

## 2017-12-02 DIAGNOSIS — R05 Cough: Secondary | ICD-10-CM | POA: Diagnosis not present

## 2017-12-02 MED ORDER — METHOTREXATE 2.5 MG PO TABS: ORAL_TABLET | ORAL | 0 refills | Status: DC

## 2017-12-02 NOTE — Addendum Note (Signed)
Addended by: Carole Binning on: 12/02/2017 12:47 PM   Modules accepted: Orders

## 2017-12-10 DIAGNOSIS — D229 Melanocytic nevi, unspecified: Secondary | ICD-10-CM | POA: Diagnosis not present

## 2017-12-10 DIAGNOSIS — L570 Actinic keratosis: Secondary | ICD-10-CM | POA: Diagnosis not present

## 2017-12-10 DIAGNOSIS — L578 Other skin changes due to chronic exposure to nonionizing radiation: Secondary | ICD-10-CM | POA: Diagnosis not present

## 2017-12-10 DIAGNOSIS — L821 Other seborrheic keratosis: Secondary | ICD-10-CM | POA: Diagnosis not present

## 2017-12-10 DIAGNOSIS — L814 Other melanin hyperpigmentation: Secondary | ICD-10-CM | POA: Diagnosis not present

## 2017-12-11 DIAGNOSIS — E298 Other testicular dysfunction: Secondary | ICD-10-CM | POA: Diagnosis not present

## 2017-12-26 DIAGNOSIS — E298 Other testicular dysfunction: Secondary | ICD-10-CM | POA: Diagnosis not present

## 2018-01-08 DIAGNOSIS — E298 Other testicular dysfunction: Secondary | ICD-10-CM | POA: Diagnosis not present

## 2018-01-22 DIAGNOSIS — E291 Testicular hypofunction: Secondary | ICD-10-CM | POA: Diagnosis not present

## 2018-01-22 NOTE — Progress Notes (Signed)
Office Visit Note  Patient: Ryan Coffey             Date of Birth: 1932-03-20           MRN: [id number]             PCP: Prince Solian, MD Referring: Prince Solian, MD Visit Date: 02/05/2018 Occupation: @GUAROCC @  Subjective: Medication management.   History of Present Illness: Ryan Coffey is a 84 y.o. male with history of seropositive rheumatoid arthritis.  He has been taking methotrexate 8 tablets/week along with folic acid.  He denies any joint pain or joint swelling.  Activities of Daily Living:  Patient reports morning stiffness for 10 minutes.   Patient Denies nocturnal pain.  Difficulty dressing/grooming: Denies Difficulty climbing stairs: Denies Difficulty getting out of chair: Denies Difficulty using hands for taps, buttons, cutlery, and/or writing: Denies  Review of Systems  Constitutional: Negative for fatigue and night sweats.  HENT: Negative for mouth sores, mouth dryness and nose dryness.   Eyes: Negative for redness and dryness.  Respiratory: Negative for shortness of breath and difficulty breathing.   Cardiovascular: Negative for chest pain, palpitations, hypertension, irregular heartbeat and swelling in legs/feet.  Gastrointestinal: Negative for constipation and diarrhea.  Endocrine: Negative for increased urination.  Musculoskeletal: Negative for arthralgias, joint pain, joint swelling, myalgias, muscle weakness, morning stiffness, muscle tenderness and myalgias.  Skin: Negative for color change, rash, hair loss, nodules/bumps, skin tightness, ulcers and sensitivity to sunlight.  Allergic/Immunologic: Negative for susceptible to infections.  Neurological: Negative for dizziness, fainting, memory loss, night sweats and weakness ( ).  Hematological: Negative for swollen glands.  Psychiatric/Behavioral: Negative for depressed mood and sleep disturbance. The patient is not nervous/anxious.     PMFS History:  Patient Active Problem List   Diagnosis Date Noted  . Renal cell carcinoma ([place]) 08/29/2016  . History of COPD 08/29/2016  . Rheumatoid nodulosis ([place]) 08/24/2016  . High risk medication use 08/24/2016  . Thrombocytosis ([place]) 08/24/2016  . Elevated LFTs mild elevation of ALT  08/24/2016  . History of renal cell carcinoma 08/24/2016  . History of prostate cancer 08/24/2016  . History of lung cancer 08/24/2016  . History of adrenal insufficiency 08/24/2016  . HCAP (healthcare-associated pneumonia) 06/27/2011    Class: Acute  . Seropositive rheumatoid arthritis ([place]) 06/27/2011  . Panhypopituitarism ([place]) 06/27/2011  . Degenerative joint disease 06/27/2011  . History of tobacco use 06/27/2011  . Hyperlipidemia 06/27/2011    Past Medical History:  Diagnosis Date  . Arthritis   . Benign tumor of pituitary gland ([place])    "wasn't able to get it all"  . Pneumonia 06/27/11   "first time"  . Shingles     Family History  Problem Relation Age of Onset  . Heart disease Mother   . Cancer Brother   . Heart disease Brother    Past Surgical History:  Procedure Laterality Date  . CHOLECYSTECTOMY OPEN  2010  . KIDNEY SURGERY  2011   "lesion removed right side"  . RENAL BIOPSY  2012   right  . TRANSPHENOIDAL / TRANSNASAL HYPOPHYSECTOMY / RESECTION PITUITARY TUMOR  ~ 1979   "they couldn't get it all"   Social History   Social History Narrative  . Not on file   Immunization History  Administered Date(s) Administered  . Pneumococcal-Unspecified 06/04/2006   Objective: Vital Signs: BP 132/82 (BP Location: Left Arm, Patient Position: Sitting, Cuff Size: Normal)   Pulse 77   Resp 15  Ht 5\' 8"  (1.727 m)   Wt 164 lb 6.4 oz (74.6 kg)   BMI 25.00 kg/m    Physical Exam  Constitutional: He is oriented to person, place, and time. He appears well-developed and well-nourished.  HENT:  Head: Normocephalic and atraumatic.  Eyes: Pupils are equal, round, and reactive to light. Conjunctivae and EOM are normal.  Neck:  Normal range of motion. Neck supple.  Cardiovascular: Normal rate, regular rhythm and normal heart sounds.  Pulmonary/Chest: Effort normal and breath sounds normal.  Abdominal: Soft. Bowel sounds are normal.  Neurological: He is alert and oriented to person, place, and time.  Skin: Skin is warm and dry. Capillary refill takes less than 2 seconds.  Psychiatric: He has a normal mood and affect. His behavior is normal.  Nursing note and vitals reviewed.    Musculoskeletal Exam: C-spine is in good range of motion.  He has thoracic kyphosis.  Shoulder joints elbow joints were in good range of motion.  He has limited range of motion in his wrist joints especially his left wrist.  He also has bilateral MCP thickening PIP and DIP thickening with nodulosis.  Hip joints and knee joints were in good range of motion.  Synovitis was noted in his knees or ankles.   CDAI Exam: CDAI Score: 0.4  Patient Global Assessment: 2 (mm); Provider Global Assessment: 2 (mm) Swollen: 0 ; Tender: 0  Joint Exam   Not documented   There is currently no information documented on the homunculus. Go to the Rheumatology activity and complete the homunculus joint exam.  Investigation: No additional findings.  Imaging: No results found.  Recent Labs: Lab Results  Component Value Date   WBC 12.1 (H) 11/26/2017   HGB 14.6 11/26/2017   PLT 417 (H) 11/26/2017   NA 137 11/26/2017   K 4.9 11/26/2017   CL 99 11/26/2017   CO2 29 11/26/2017   GLUCOSE 115 (H) 11/26/2017   BUN 21 11/26/2017   CREATININE 0.99 11/26/2017   BILITOT 0.8 11/26/2017   ALKPHOS 108 08/29/2016   AST 24 11/26/2017   ALT 17 11/26/2017   PROT 6.2 11/26/2017   ALBUMIN 4.2 08/29/2016   CALCIUM 9.7 11/26/2017   GFRAA 82 11/26/2017    Speciality Comments: No specialty comments available.  Procedures:  No procedures performed Allergies: Patient has no known allergies.   Assessment / Plan:     Visit Diagnoses: Seropositive rheumatoid  arthritis ([place]) - +RF. Severe erosive disease with subluxation in multiple joints. He is been on methotrexate for 25 years.  He has no active synovitis but has multiple contractures in his joints.  He is very active and does work out on daily basis.  High risk medication use -  Current regimen includes methotrexate 4 tablets twice a week and folic acid 2 mg daily.  Most recent CBC/CMP within normal limits on 11/26/2017.  Next CBC/CMP due the end of December and then every 3 months.  Standing orders are in place.  Recommend flu, Pneumovax 23, Prevnar 13, and Shingrix as indicated.  X-ray of bilateral hands and feet taken on 08/29/2016.  Rheumatoid nodulosis (HCC)-nodulosis is a stable.  Thrombocytosis (HCC)-stable   Panhypopituitarism (HCC) - on bromocriptine  History of adrenal insufficiency  History of kyphosis-patient states his DEXA scan has been done by his PCP and has been stable.  I do not have results to review.  History of renal cell carcinoma  History of peptic ulcer disease  History of prostate cancer  History of  hyperlipidemia  History of lung cancer  History of COPD  History of kidney stones  History of tobacco use   Orders: Orders Placed This Encounter  Procedures  . CBC with Differential/Platelet  . COMPLETE METABOLIC PANEL WITH GFR   No orders of the defined types were placed in this encounter.     Follow-Up Instructions: Return for Rheumatoid arthritis.   Bo Merino, MD  Note - This record has been created using Editor, commissioning.  Chart creation errors have been sought, but may not always  have been located. Such creation errors do not reflect on  the standard of medical care.

## 2018-01-27 DIAGNOSIS — E230 Hypopituitarism: Secondary | ICD-10-CM | POA: Diagnosis not present

## 2018-01-27 DIAGNOSIS — E291 Testicular hypofunction: Secondary | ICD-10-CM | POA: Diagnosis not present

## 2018-01-27 DIAGNOSIS — E038 Other specified hypothyroidism: Secondary | ICD-10-CM | POA: Diagnosis not present

## 2018-01-27 DIAGNOSIS — J449 Chronic obstructive pulmonary disease, unspecified: Secondary | ICD-10-CM | POA: Diagnosis not present

## 2018-01-27 DIAGNOSIS — E7849 Other hyperlipidemia: Secondary | ICD-10-CM | POA: Diagnosis not present

## 2018-01-27 DIAGNOSIS — Z6824 Body mass index (BMI) 24.0-24.9, adult: Secondary | ICD-10-CM | POA: Diagnosis not present

## 2018-01-27 DIAGNOSIS — M069 Rheumatoid arthritis, unspecified: Secondary | ICD-10-CM | POA: Diagnosis not present

## 2018-01-27 DIAGNOSIS — R05 Cough: Secondary | ICD-10-CM | POA: Diagnosis not present

## 2018-02-05 ENCOUNTER — Ambulatory Visit (INDEPENDENT_AMBULATORY_CARE_PROVIDER_SITE_OTHER): Payer: Medicare Other | Admitting: Rheumatology

## 2018-02-05 ENCOUNTER — Encounter: Payer: Self-pay | Admitting: Rheumatology

## 2018-02-05 VITALS — BP 132/82 | HR 77 | Resp 15 | Ht 68.0 in | Wt 164.4 lb

## 2018-02-05 DIAGNOSIS — E291 Testicular hypofunction: Secondary | ICD-10-CM | POA: Diagnosis not present

## 2018-02-05 DIAGNOSIS — E230 Hypopituitarism: Secondary | ICD-10-CM | POA: Diagnosis not present

## 2018-02-05 DIAGNOSIS — Z79899 Other long term (current) drug therapy: Secondary | ICD-10-CM

## 2018-02-05 DIAGNOSIS — D473 Essential (hemorrhagic) thrombocythemia: Secondary | ICD-10-CM

## 2018-02-05 DIAGNOSIS — Z8711 Personal history of peptic ulcer disease: Secondary | ICD-10-CM

## 2018-02-05 DIAGNOSIS — Z85528 Personal history of other malignant neoplasm of kidney: Secondary | ICD-10-CM | POA: Diagnosis not present

## 2018-02-05 DIAGNOSIS — D75839 Thrombocytosis, unspecified: Secondary | ICD-10-CM

## 2018-02-05 DIAGNOSIS — Z8639 Personal history of other endocrine, nutritional and metabolic disease: Secondary | ICD-10-CM

## 2018-02-05 DIAGNOSIS — Z85118 Personal history of other malignant neoplasm of bronchus and lung: Secondary | ICD-10-CM | POA: Diagnosis not present

## 2018-02-05 DIAGNOSIS — M059 Rheumatoid arthritis with rheumatoid factor, unspecified: Secondary | ICD-10-CM | POA: Diagnosis not present

## 2018-02-05 DIAGNOSIS — Z87442 Personal history of urinary calculi: Secondary | ICD-10-CM

## 2018-02-05 DIAGNOSIS — Z8709 Personal history of other diseases of the respiratory system: Secondary | ICD-10-CM

## 2018-02-05 DIAGNOSIS — Z8546 Personal history of malignant neoplasm of prostate: Secondary | ICD-10-CM | POA: Diagnosis not present

## 2018-02-05 DIAGNOSIS — Z87891 Personal history of nicotine dependence: Secondary | ICD-10-CM

## 2018-02-05 DIAGNOSIS — M0630 Rheumatoid nodule, unspecified site: Secondary | ICD-10-CM

## 2018-02-05 DIAGNOSIS — Z8739 Personal history of other diseases of the musculoskeletal system and connective tissue: Secondary | ICD-10-CM | POA: Diagnosis not present

## 2018-02-05 NOTE — Patient Instructions (Signed)
Standing Labs We placed an order today for your standing lab work.    Please come back and get your standing labs in March and every 3 months  We have open lab Monday through Friday from 8:30-11:30 AM and 1:30-4:00 PM  at the office of Dr. Bo Merino.   You may experience shorter wait times on Monday and Friday afternoons. The office is located at [address], [place], [place], [place] [address] No appointment is necessary.   Labs are drawn by [facility].  You may receive a bill from [facility] for your lab work. If you have any questions regarding directions or hours of operation,  please call [phone].   Just as a reminder please drink plenty of water prior to coming for your lab work. Thanks!

## 2018-02-06 LAB — CBC WITH DIFFERENTIAL/PLATELET
BASOS ABS: 91 {cells}/uL (ref 0–200)
BASOS PCT: 1.2 %
EOS PCT: 2.4 %
Eosinophils Absolute: 182 cells/uL (ref 15–500)
HCT: 40.2 % (ref 38.5–50.0)
HEMOGLOBIN: 13.7 g/dL (ref 13.2–17.1)
Lymphs Abs: 1421 cells/uL (ref 850–3900)
MCH: 32.2 pg (ref 27.0–33.0)
MCHC: 34.1 g/dL (ref 32.0–36.0)
MCV: 94.6 fL (ref 80.0–100.0)
MONOS PCT: 9.7 %
MPV: 10.3 fL (ref 7.5–12.5)
Neutro Abs: 5168 cells/uL (ref 1500–7800)
Neutrophils Relative %: 68 %
PLATELETS: 376 10*3/uL (ref 140–400)
RBC: 4.25 10*6/uL (ref 4.20–5.80)
RDW: 14.2 % (ref 11.0–15.0)
Total Lymphocyte: 18.7 %
WBC mixed population: 737 cells/uL (ref 200–950)
WBC: 7.6 10*3/uL (ref 3.8–10.8)

## 2018-02-06 LAB — COMPLETE METABOLIC PANEL WITH GFR
AG RATIO: 2 (calc) (ref 1.0–2.5)
ALKALINE PHOSPHATASE (APISO): 70 U/L (ref 40–115)
ALT: 15 U/L (ref 9–46)
AST: 24 U/L (ref 10–35)
Albumin: 4.1 g/dL (ref 3.6–5.1)
BUN: 21 mg/dL (ref 7–25)
CO2: 30 mmol/L (ref 20–32)
Calcium: 9.5 mg/dL (ref 8.6–10.3)
Chloride: 104 mmol/L (ref 98–110)
Creat: 0.92 mg/dL (ref 0.70–1.11)
GFR, EST NON AFRICAN AMERICAN: 77 mL/min/{1.73_m2} (ref >=60)
GFR, Est African American: 89 mL/min/{1.73_m2} (ref >=60)
GLOBULIN: 2.1 g/dL (ref 1.9–3.7)
Glucose, Bld: 104 mg/dL — ABNORMAL HIGH (ref 65–99)
POTASSIUM: 4.4 mmol/L (ref 3.5–5.3)
SODIUM: 141 mmol/L (ref 135–146)
Total Bilirubin: 0.8 mg/dL (ref 0.2–1.2)
Total Protein: 6.2 g/dL (ref 6.1–8.1)

## 2018-02-19 DIAGNOSIS — E291 Testicular hypofunction: Secondary | ICD-10-CM | POA: Diagnosis not present

## 2018-03-04 DIAGNOSIS — E291 Testicular hypofunction: Secondary | ICD-10-CM | POA: Diagnosis not present

## 2018-03-07 ENCOUNTER — Other Ambulatory Visit: Payer: Self-pay | Admitting: Rheumatology

## 2018-03-07 NOTE — Telephone Encounter (Signed)
Last visit: 02/05/18 Next Visit: 07/09/18 Labs: 02/05/18 CBC WNL. Glucose is 104. All other labs are WNL  Okay to refill per Dr. Estanislado Pandy

## 2018-03-19 DIAGNOSIS — E291 Testicular hypofunction: Secondary | ICD-10-CM | POA: Diagnosis not present

## 2018-03-20 DIAGNOSIS — Z6823 Body mass index (BMI) 23.0-23.9, adult: Secondary | ICD-10-CM | POA: Diagnosis not present

## 2018-03-20 DIAGNOSIS — R05 Cough: Secondary | ICD-10-CM | POA: Diagnosis not present

## 2018-03-20 DIAGNOSIS — K279 Peptic ulcer, site unspecified, unspecified as acute or chronic, without hemorrhage or perforation: Secondary | ICD-10-CM | POA: Diagnosis not present

## 2018-03-20 DIAGNOSIS — M069 Rheumatoid arthritis, unspecified: Secondary | ICD-10-CM | POA: Diagnosis not present

## 2018-03-20 DIAGNOSIS — J449 Chronic obstructive pulmonary disease, unspecified: Secondary | ICD-10-CM | POA: Diagnosis not present

## 2018-04-09 DIAGNOSIS — E291 Testicular hypofunction: Secondary | ICD-10-CM | POA: Diagnosis not present

## 2018-04-16 DIAGNOSIS — E291 Testicular hypofunction: Secondary | ICD-10-CM | POA: Diagnosis not present

## 2018-04-30 DIAGNOSIS — E291 Testicular hypofunction: Secondary | ICD-10-CM | POA: Diagnosis not present

## 2018-04-30 DIAGNOSIS — K279 Peptic ulcer, site unspecified, unspecified as acute or chronic, without hemorrhage or perforation: Secondary | ICD-10-CM | POA: Diagnosis not present

## 2018-04-30 DIAGNOSIS — Z6823 Body mass index (BMI) 23.0-23.9, adult: Secondary | ICD-10-CM | POA: Diagnosis not present

## 2018-04-30 DIAGNOSIS — J449 Chronic obstructive pulmonary disease, unspecified: Secondary | ICD-10-CM | POA: Diagnosis not present

## 2018-04-30 DIAGNOSIS — R05 Cough: Secondary | ICD-10-CM | POA: Diagnosis not present

## 2018-05-14 DIAGNOSIS — E291 Testicular hypofunction: Secondary | ICD-10-CM | POA: Diagnosis not present

## 2018-05-15 ENCOUNTER — Emergency Department (HOSPITAL_COMMUNITY): Payer: Medicare Other

## 2018-05-15 ENCOUNTER — Inpatient Hospital Stay (HOSPITAL_COMMUNITY)
Admission: EM | Admit: 2018-05-15 | Discharge: 2018-05-20 | DRG: 871 | Disposition: A | Discharge status: Home Health | Payer: Medicare Other | Attending: Internal Medicine | Admitting: Internal Medicine

## 2018-05-15 ENCOUNTER — Encounter (HOSPITAL_COMMUNITY): Payer: Self-pay

## 2018-05-15 ENCOUNTER — Other Ambulatory Visit: Payer: Self-pay

## 2018-05-15 ENCOUNTER — Inpatient Hospital Stay (HOSPITAL_COMMUNITY): Payer: Medicare Other

## 2018-05-15 DIAGNOSIS — E039 Hypothyroidism, unspecified: Secondary | ICD-10-CM | POA: Diagnosis present

## 2018-05-15 DIAGNOSIS — J449 Chronic obstructive pulmonary disease, unspecified: Secondary | ICD-10-CM

## 2018-05-15 DIAGNOSIS — E041 Nontoxic single thyroid nodule: Secondary | ICD-10-CM | POA: Diagnosis present

## 2018-05-15 DIAGNOSIS — M059 Rheumatoid arthritis with rheumatoid factor, unspecified: Secondary | ICD-10-CM | POA: Diagnosis present

## 2018-05-15 DIAGNOSIS — I959 Hypotension, unspecified: Secondary | ICD-10-CM | POA: Diagnosis not present

## 2018-05-15 DIAGNOSIS — Z79899 Other long term (current) drug therapy: Secondary | ICD-10-CM

## 2018-05-15 DIAGNOSIS — Z79890 Hormone replacement therapy (postmenopausal): Secondary | ICD-10-CM | POA: Diagnosis not present

## 2018-05-15 DIAGNOSIS — J189 Pneumonia, unspecified organism: Secondary | ICD-10-CM | POA: Diagnosis not present

## 2018-05-15 DIAGNOSIS — K219 Gastro-esophageal reflux disease without esophagitis: Secondary | ICD-10-CM | POA: Diagnosis present

## 2018-05-15 DIAGNOSIS — E876 Hypokalemia: Secondary | ICD-10-CM | POA: Diagnosis not present

## 2018-05-15 DIAGNOSIS — R0602 Shortness of breath: Secondary | ICD-10-CM

## 2018-05-15 DIAGNOSIS — D709 Neutropenia, unspecified: Secondary | ICD-10-CM | POA: Diagnosis not present

## 2018-05-15 DIAGNOSIS — Z85118 Personal history of other malignant neoplasm of bronchus and lung: Secondary | ICD-10-CM | POA: Diagnosis not present

## 2018-05-15 DIAGNOSIS — J440 Chronic obstructive pulmonary disease with acute lower respiratory infection: Secondary | ICD-10-CM | POA: Diagnosis present

## 2018-05-15 DIAGNOSIS — D899 Disorder involving the immune mechanism, unspecified: Secondary | ICD-10-CM | POA: Diagnosis present

## 2018-05-15 DIAGNOSIS — E785 Hyperlipidemia, unspecified: Secondary | ICD-10-CM

## 2018-05-15 DIAGNOSIS — J181 Lobar pneumonia, unspecified organism: Secondary | ICD-10-CM | POA: Diagnosis present

## 2018-05-15 DIAGNOSIS — E2740 Unspecified adrenocortical insufficiency: Secondary | ICD-10-CM | POA: Diagnosis present

## 2018-05-15 DIAGNOSIS — J8410 Pulmonary fibrosis, unspecified: Secondary | ICD-10-CM | POA: Diagnosis not present

## 2018-05-15 DIAGNOSIS — Z8709 Personal history of other diseases of the respiratory system: Secondary | ICD-10-CM | POA: Diagnosis not present

## 2018-05-15 DIAGNOSIS — Z8546 Personal history of malignant neoplasm of prostate: Secondary | ICD-10-CM

## 2018-05-15 DIAGNOSIS — R0902 Hypoxemia: Secondary | ICD-10-CM | POA: Diagnosis not present

## 2018-05-15 DIAGNOSIS — Z87891 Personal history of nicotine dependence: Secondary | ICD-10-CM | POA: Diagnosis not present

## 2018-05-15 DIAGNOSIS — C649 Malignant neoplasm of unspecified kidney, except renal pelvis: Secondary | ICD-10-CM | POA: Diagnosis not present

## 2018-05-15 DIAGNOSIS — Y95 Nosocomial condition: Secondary | ICD-10-CM | POA: Diagnosis present

## 2018-05-15 DIAGNOSIS — Z85528 Personal history of other malignant neoplasm of kidney: Secondary | ICD-10-CM

## 2018-05-15 DIAGNOSIS — A419 Sepsis, unspecified organism: Principal | ICD-10-CM | POA: Diagnosis present

## 2018-05-15 DIAGNOSIS — D352 Benign neoplasm of pituitary gland: Secondary | ICD-10-CM | POA: Diagnosis present

## 2018-05-15 DIAGNOSIS — R05 Cough: Secondary | ICD-10-CM | POA: Diagnosis not present

## 2018-05-15 LAB — RESPIRATORY PANEL BY PCR
Adenovirus: NOT DETECTED
Bordetella pertussis: NOT DETECTED
CHLAMYDOPHILA PNEUMONIAE-RVPPCR: NOT DETECTED
Coronavirus 229E: NOT DETECTED
Coronavirus HKU1: NOT DETECTED
Coronavirus NL63: NOT DETECTED
Coronavirus OC43: NOT DETECTED
Influenza A: NOT DETECTED
Influenza B: NOT DETECTED
Metapneumovirus: NOT DETECTED
Mycoplasma pneumoniae: NOT DETECTED
Parainfluenza Virus 1: NOT DETECTED
Parainfluenza Virus 2: NOT DETECTED
Parainfluenza Virus 3: NOT DETECTED
Parainfluenza Virus 4: NOT DETECTED
RESPIRATORY SYNCYTIAL VIRUS-RVPPCR: NOT DETECTED
Rhinovirus / Enterovirus: NOT DETECTED

## 2018-05-15 LAB — COMPREHENSIVE METABOLIC PANEL
ALT: 34 U/L (ref 0–44)
AST: 24 U/L (ref 15–41)
Albumin: 2.9 g/dL — ABNORMAL LOW (ref 3.5–5.0)
Alkaline Phosphatase: 53 U/L (ref 38–126)
Anion gap: 11 (ref 5–15)
BILIRUBIN TOTAL: 1.7 mg/dL — AB (ref 0.3–1.2)
BUN: 16 mg/dL (ref 8–23)
CHLORIDE: 97 mmol/L — AB (ref 98–111)
CO2: 28 mmol/L (ref 22–32)
Calcium: 8.2 mg/dL — ABNORMAL LOW (ref 8.9–10.3)
Creatinine, Ser: 1.01 mg/dL (ref 0.61–1.24)
GFR calc Af Amer: >60 mL/min (ref >60)
Glucose, Bld: 105 mg/dL — ABNORMAL HIGH (ref 70–99)
Potassium: 2.8 mmol/L — ABNORMAL LOW (ref 3.5–5.1)
Sodium: 136 mmol/L (ref 135–145)
Total Protein: 6.3 g/dL — ABNORMAL LOW (ref 6.5–8.1)

## 2018-05-15 LAB — CBC WITH DIFFERENTIAL/PLATELET
Abs Immature Granulocytes: 0 10*3/uL (ref 0.00–0.07)
Basophils Absolute: 0 10*3/uL (ref 0.0–0.1)
Basophils Relative: 1 %
Eosinophils Absolute: 0 10*3/uL (ref 0.0–0.5)
Eosinophils Relative: 1 %
HEMATOCRIT: 36.9 % — AB (ref 39.0–52.0)
Hemoglobin: 11.6 g/dL — ABNORMAL LOW (ref 13.0–17.0)
Immature Granulocytes: 0 %
Lymphocytes Relative: 47 %
Lymphs Abs: 0.4 10*3/uL — ABNORMAL LOW (ref 0.7–4.0)
MCH: 31.4 pg (ref 26.0–34.0)
MCHC: 31.4 g/dL (ref 30.0–36.0)
MCV: 100 fL (ref 80.0–100.0)
MONO ABS: 0.2 10*3/uL (ref 0.1–1.0)
MONOS PCT: 20 %
Neutro Abs: 0.2 10*3/uL — ABNORMAL LOW (ref 1.7–7.7)
Neutrophils Relative %: 31 %
PLATELETS: 170 10*3/uL (ref 150–400)
RBC: 3.69 MIL/uL — ABNORMAL LOW (ref 4.22–5.81)
RDW: 15.4 % (ref 11.5–15.5)
WBC: 0.8 10*3/uL — CL (ref 4.0–10.5)
nRBC: 0 % (ref 0.0–0.2)

## 2018-05-15 LAB — URINALYSIS, ROUTINE W REFLEX MICROSCOPIC
Bacteria, UA: NONE SEEN
Bilirubin Urine: NEGATIVE
Glucose, UA: NEGATIVE mg/dL
HGB URINE DIPSTICK: NEGATIVE
Ketones, ur: 5 mg/dL — AB
Leukocytes,Ua: NEGATIVE
NITRITE: NEGATIVE
Protein, ur: 100 mg/dL — AB
SPECIFIC GRAVITY, URINE: 1.02 (ref 1.005–1.030)
pH: 5 (ref 5.0–8.0)

## 2018-05-15 LAB — PROTIME-INR
INR: 1.2 (ref 0.8–1.2)
Prothrombin Time: 14.8 seconds (ref 11.4–15.2)

## 2018-05-15 LAB — INFLUENZA PANEL BY PCR (TYPE A & B)
Influenza A By PCR: NEGATIVE
Influenza B By PCR: NEGATIVE

## 2018-05-15 LAB — LACTIC ACID, PLASMA
Lactic Acid, Venous: 1.2 mmol/L (ref 0.5–1.9)
Lactic Acid, Venous: 2.3 mmol/L (ref 0.5–1.9)

## 2018-05-15 LAB — APTT: aPTT: 34 seconds (ref 24–36)

## 2018-05-15 LAB — PROCALCITONIN: PROCALCITONIN: 1.74 ng/mL

## 2018-05-15 MED ORDER — VANCOMYCIN HCL 10 G IV SOLR
1250.0000 mg | INTRAVENOUS | Status: DC
  Administered 2018-05-16 – 2018-05-18 (×3): 1250 mg via INTRAVENOUS
  Filled 2018-05-15 (×3): qty 1250

## 2018-05-15 MED ORDER — SODIUM CHLORIDE 0.9 % IV SOLN
INTRAVENOUS | Status: DC
  Administered 2018-05-15: 17:00:00 via INTRAVENOUS

## 2018-05-15 MED ORDER — PANTOPRAZOLE SODIUM 40 MG PO TBEC
40.0000 mg | DELAYED_RELEASE_TABLET | Freq: Two times a day (BID) | ORAL | Status: DC
  Administered 2018-05-15 – 2018-05-20 (×10): 40 mg via ORAL
  Filled 2018-05-15 (×10): qty 1

## 2018-05-15 MED ORDER — SODIUM CHLORIDE 0.9 % IV BOLUS
1000.0000 mL | Freq: Once | INTRAVENOUS | Status: AC
  Administered 2018-05-15: 1000 mL via INTRAVENOUS

## 2018-05-15 MED ORDER — CALCIUM CARBONATE-VITAMIN D 500-200 MG-UNIT PO TABS
1.0000 | ORAL_TABLET | Freq: Two times a day (BID) | ORAL | Status: DC
  Administered 2018-05-15 – 2018-05-20 (×10): 1 via ORAL
  Filled 2018-05-15 (×10): qty 1

## 2018-05-15 MED ORDER — LEVOTHYROXINE SODIUM 137 MCG PO TABS
137.0000 ug | ORAL_TABLET | Freq: Every day | ORAL | Status: DC
  Administered 2018-05-16 – 2018-05-20 (×5): 137 ug via ORAL
  Filled 2018-05-15 (×5): qty 1

## 2018-05-15 MED ORDER — HYDROCORTISONE 2.5 % RE CREA: 1.0000 "application " | TOPICAL_CREAM | Freq: Four times a day (QID) | RECTAL | Status: DC | PRN

## 2018-05-15 MED ORDER — METRONIDAZOLE IN NACL 5-0.79 MG/ML-% IV SOLN
500.0000 mg | Freq: Three times a day (TID) | INTRAVENOUS | Status: DC
  Administered 2018-05-15 – 2018-05-16 (×3): 500 mg via INTRAVENOUS
  Filled 2018-05-15 (×3): qty 100

## 2018-05-15 MED ORDER — POLYETHYLENE GLYCOL 3350 17 G PO PACK: 17.0000 g | PACK | Freq: Every day | ORAL | Status: DC | PRN

## 2018-05-15 MED ORDER — SENNOSIDES-DOCUSATE SODIUM 8.6-50 MG PO TABS: 2.0000 | ORAL_TABLET | Freq: Every evening | ORAL | Status: DC | PRN

## 2018-05-15 MED ORDER — POTASSIUM CHLORIDE 10 MEQ/100ML IV SOLN
10.0000 meq | INTRAVENOUS | Status: AC
  Administered 2018-05-15 (×4): 10 meq via INTRAVENOUS
  Filled 2018-05-15 (×4): qty 100

## 2018-05-15 MED ORDER — POTASSIUM CHLORIDE 10 MEQ/100ML IV SOLN
10.0000 meq | Freq: Once | INTRAVENOUS | Status: AC
  Administered 2018-05-15: 10 meq via INTRAVENOUS
  Filled 2018-05-15: qty 100

## 2018-05-15 MED ORDER — PHENOL 1.4 % MT LIQD: 1.0000 | OROMUCOSAL | Status: DC | PRN

## 2018-05-15 MED ORDER — PRAVASTATIN SODIUM 20 MG PO TABS
80.0000 mg | ORAL_TABLET | Freq: Every day | ORAL | Status: DC
  Administered 2018-05-15 – 2018-05-18 (×4): 80 mg via ORAL
  Filled 2018-05-15 (×3): qty 4

## 2018-05-15 MED ORDER — SODIUM CHLORIDE (PF) 0.9 % IJ SOLN
INTRAMUSCULAR | Status: AC
  Filled 2018-05-15: qty 50

## 2018-05-15 MED ORDER — ENOXAPARIN SODIUM 40 MG/0.4ML ~~LOC~~ SOLN
40.0000 mg | SUBCUTANEOUS | Status: DC
  Administered 2018-05-15: 40 mg via SUBCUTANEOUS
  Filled 2018-05-15: qty 0.4

## 2018-05-15 MED ORDER — HYDROCODONE-ACETAMINOPHEN 5-325 MG PO TABS
1.0000 | ORAL_TABLET | ORAL | Status: DC | PRN
  Administered 2018-05-15 – 2018-05-16 (×4): 2 via ORAL
  Filled 2018-05-15 (×4): qty 2

## 2018-05-15 MED ORDER — BROMOCRIPTINE MESYLATE 2.5 MG PO TABS
2.5000 mg | ORAL_TABLET | Freq: Two times a day (BID) | ORAL | Status: DC
  Administered 2018-05-15 – 2018-05-20 (×10): 2.5 mg via ORAL
  Filled 2018-05-15 (×11): qty 1

## 2018-05-15 MED ORDER — SALINE SPRAY 0.65 % NA SOLN: 1.0000 | NASAL | Status: DC | PRN

## 2018-05-15 MED ORDER — ACETAMINOPHEN 650 MG RE SUPP: 650.0000 mg | Freq: Four times a day (QID) | RECTAL | Status: DC | PRN

## 2018-05-15 MED ORDER — HYDRALAZINE HCL 20 MG/ML IJ SOLN
10.0000 mg | INTRAMUSCULAR | Status: DC | PRN
  Administered 2018-05-19: 10 mg via INTRAVENOUS
  Filled 2018-05-15: qty 1

## 2018-05-15 MED ORDER — POLYVINYL ALCOHOL 1.4 % OP SOLN: 1.0000 [drp] | OPHTHALMIC | Status: DC | PRN

## 2018-05-15 MED ORDER — MUSCLE RUB 10-15 % EX CREA: 1.0000 "application " | TOPICAL_CREAM | CUTANEOUS | Status: DC | PRN

## 2018-05-15 MED ORDER — HYDROCORTISONE 1 % EX CREA: 1.0000 "application " | TOPICAL_CREAM | Freq: Three times a day (TID) | CUTANEOUS | Status: DC | PRN

## 2018-05-15 MED ORDER — LORATADINE 10 MG PO TABS: 10.0000 mg | ORAL_TABLET | Freq: Every day | ORAL | Status: DC | PRN

## 2018-05-15 MED ORDER — ALUM & MAG HYDROXIDE-SIMETH 200-200-20 MG/5ML PO SUSP: 30.0000 mL | ORAL | Status: DC | PRN

## 2018-05-15 MED ORDER — FOLIC ACID 1 MG PO TABS
2.0000 mg | ORAL_TABLET | Freq: Every day | ORAL | Status: DC
  Administered 2018-05-15 – 2018-05-20 (×6): 2 mg via ORAL
  Filled 2018-05-15 (×6): qty 2

## 2018-05-15 MED ORDER — POTASSIUM CHLORIDE CRYS ER 20 MEQ PO TBCR
40.0000 meq | EXTENDED_RELEASE_TABLET | Freq: Once | ORAL | Status: AC
  Administered 2018-05-15: 40 meq via ORAL
  Filled 2018-05-15: qty 2

## 2018-05-15 MED ORDER — MAGNESIUM SULFATE 2 GM/50ML IV SOLN
2.0000 g | Freq: Once | INTRAVENOUS | Status: AC
  Administered 2018-05-15: 2 g via INTRAVENOUS
  Filled 2018-05-15 (×2): qty 50

## 2018-05-15 MED ORDER — SODIUM CHLORIDE 0.9 % IV SOLN
INTRAVENOUS | Status: DC
  Administered 2018-05-15: 23:00:00 via INTRAVENOUS

## 2018-05-15 MED ORDER — ACETAMINOPHEN 325 MG PO TABS: 650.0000 mg | ORAL_TABLET | Freq: Four times a day (QID) | ORAL | Status: DC | PRN

## 2018-05-15 MED ORDER — LIP MEDEX EX OINT
1.0000 "application " | TOPICAL_OINTMENT | CUTANEOUS | Status: DC | PRN
  Filled 2018-05-15: qty 7

## 2018-05-15 MED ORDER — VANCOMYCIN HCL 10 G IV SOLR
1500.0000 mg | Freq: Once | INTRAVENOUS | Status: AC
  Administered 2018-05-15: 1500 mg via INTRAVENOUS
  Filled 2018-05-15: qty 1500

## 2018-05-15 MED ORDER — IPRATROPIUM-ALBUTEROL 0.5-2.5 (3) MG/3ML IN SOLN
3.0000 mL | RESPIRATORY_TRACT | Status: DC | PRN
  Administered 2018-05-18: 3 mL via RESPIRATORY_TRACT
  Filled 2018-05-15: qty 3

## 2018-05-15 MED ORDER — IOHEXOL 350 MG/ML SOLN
100.0000 mL | Freq: Once | INTRAVENOUS | Status: AC | PRN
  Administered 2018-05-15: 100 mL via INTRAVENOUS

## 2018-05-15 MED ORDER — SODIUM CHLORIDE 0.9 % IV SOLN
2.0000 g | Freq: Two times a day (BID) | INTRAVENOUS | Status: DC
  Administered 2018-05-15 – 2018-05-19 (×8): 2 g via INTRAVENOUS
  Filled 2018-05-15 (×10): qty 2

## 2018-05-15 MED ORDER — SODIUM CHLORIDE 0.9 % IV SOLN: 2.0000 g | Freq: Once | INTRAVENOUS | Status: DC

## 2018-05-15 NOTE — ED Provider Notes (Signed)
[facility] DEPT Provider Note   CSN: [id number] Arrival date & time: 05/15/18  1130    History   Chief Complaint Chief Complaint  Patient presents with   Fever   Cough    HPI Ryan Coffey is a 85 y.o. male.     Patient has had a cough and shortness of breath for 2 weeks.  He has been on antibiotics twice without response.  Patient comes in here with continued cough and weakness  The history is provided by the patient. No language interpreter was used.  Cough  Cough characteristics:  Productive Sputum characteristics:  Nondescript Severity:  Moderate Onset quality:  Sudden Timing:  Constant Progression:  Worsening Chronicity:  Recurrent Smoker: no   Context: not animal exposure   Relieved by:  Nothing Associated symptoms: shortness of breath   Associated symptoms: no chest pain, no eye discharge, no headaches and no rash     Past Medical History:  Diagnosis Date   Arthritis    Benign tumor of pituitary gland ([place])    "wasn't able to get it all"   Pneumonia 06/27/11   "first time"   Shingles     Patient Active Problem List   Diagnosis Date Noted   Sepsis ([place]) 05/15/2018   Renal cell carcinoma ([place]) 08/29/2016   History of COPD 08/29/2016   Rheumatoid nodulosis ([place]) 08/24/2016   High risk medication use 08/24/2016   Thrombocytosis ([place]) 08/24/2016   Elevated LFTs mild elevation of ALT  08/24/2016   History of renal cell carcinoma 08/24/2016   History of prostate cancer 08/24/2016   History of lung cancer 08/24/2016   History of adrenal insufficiency 08/24/2016   HCAP (healthcare-associated pneumonia) 06/27/2011    Class: Acute   Seropositive rheumatoid arthritis ([place]) 06/27/2011   Panhypopituitarism ([place]) 06/27/2011   Degenerative joint disease 06/27/2011   History of tobacco use 06/27/2011   Hyperlipidemia 06/27/2011    Past Surgical History:  Procedure Laterality Date   CHOLECYSTECTOMY  OPEN  2010   KIDNEY SURGERY  2011   "lesion removed right side"   RENAL BIOPSY  2012   right   TRANSPHENOIDAL / TRANSNASAL HYPOPHYSECTOMY / RESECTION PITUITARY TUMOR  ~ 1979   "they couldn't get it all"        Home Medications    Prior to Admission medications   Medication Sig Start Date End Date Taking? Authorizing Provider  acetaminophen (TYLENOL) 325 MG tablet Take 650 mg by mouth 2 (two) times daily as needed (arthritis).   Yes [provider]  bromocriptine (PARLODEL) 2.5 MG tablet Take 2.5 mg by mouth 2 (two) times daily.   Yes [provider]  Calcium Carbonate-Vitamin D (CALCIUM 500 + D) 500-125 MG-UNIT TABS Take 1 tablet by mouth 2 (two) times daily.   Yes [provider]  esomeprazole (NEXIUM) 40 MG capsule Take 40 mg by mouth 2 (two) times daily. 03/20/18  Yes [provider]  folic acid (FOLVITE) 1 MG tablet TAKE 2 TABLETS EVERY DAY Patient taking differently: Take 2 mg by mouth daily.  08/15/17  Yes Deveshwar, Abel Presto, MD  hydrocortisone (CORTEF) 20 MG tablet Take 10-20 mg by mouth daily. 20 mg in the morning and 10mg  at night 11/02/16  Yes [provider]  levothyroxine (SYNTHROID, LEVOTHROID) 137 MCG tablet Take 137 mcg by mouth daily.     Yes [provider]  methotrexate (RHEUMATREX) 2.5 MG tablet TAKE 4 tablets on Saturday and 4 tablets on Sunday. Caution:Chemotherapy. Protect  from light. 03/07/18  Yes Deveshwar, Abel Presto, MD  Multiple Vitamins-Minerals (CENTRUM SILVER PO) Take 1 tablet by mouth daily.    Yes [provider]  pantoprazole (PROTONIX) 40 MG tablet Take 40 mg by mouth 2 (two) times daily.   Yes [provider]  pravastatin (PRAVACHOL) 80 MG tablet Take 80 mg by mouth daily.   Yes [provider]  testosterone cypionate (DEPOTESTOSTERONE CYPIONATE) 200 MG/ML injection Inject 75 mg into the muscle every 14 (fourteen) days.  05/24/16  Yes [provider]    Family  History Family History  Problem Relation Age of Onset   Heart disease Mother    Cancer Brother    Heart disease Brother     Social History Social History   Tobacco Use   Smoking status: Former Smoker    Packs/day: 0.50    Years: 10.00    Pack years: 5.00    Types: Cigarettes    Last attempt to quit: 08/03/1980    Years since quitting: 37.8   Smokeless tobacco: Never Used  Substance Use Topics   Alcohol use: Yes    Alcohol/week: 3.0 standard drinks    Types: 3 Glasses of wine per week   Drug use: No     Allergies   Patient has no known allergies.   Review of Systems Review of Systems  Constitutional: Negative for appetite change and fatigue.  HENT: Negative for congestion, ear discharge and sinus pressure.   Eyes: Negative for discharge.  Respiratory: Positive for cough and shortness of breath.   Cardiovascular: Negative for chest pain.  Gastrointestinal: Negative for abdominal pain and diarrhea.  Genitourinary: Negative for frequency and hematuria.  Musculoskeletal: Negative for back pain.  Skin: Negative for rash.  Neurological: Negative for seizures and headaches.  Psychiatric/Behavioral: Negative for hallucinations.     Physical Exam Updated Vital Signs BP 112/60    Pulse 85    Temp 99.2 F (37.3 C) (Oral)    Resp 20    Wt 70.3 kg    SpO2 90%    BMI 23.57 kg/m   Physical Exam Vitals signs reviewed.  Constitutional:      Appearance: He is well-developed.  HENT:     Head: Normocephalic.     Nose: Nose normal.  Eyes:     General: No scleral icterus.    Conjunctiva/sclera: Conjunctivae normal.  Neck:     Musculoskeletal: Neck supple.     Thyroid: No thyromegaly.  Cardiovascular:     Rate and Rhythm: Normal rate and regular rhythm.     Heart sounds: No murmur. No friction rub. No gallop.   Pulmonary:     Breath sounds: No stridor. Wheezing present. No rales.  Chest:     Chest wall: No tenderness.  Abdominal:     General: There is no  distension.     Tenderness: There is no abdominal tenderness. There is no rebound.  Musculoskeletal: Normal range of motion.  Lymphadenopathy:     Cervical: No cervical adenopathy.  Skin:    Findings: No erythema or rash.  Neurological:     Mental Status: He is oriented to person, place, and time.     Motor: No abnormal muscle tone.     Coordination: Coordination normal.  Psychiatric:        Behavior: Behavior normal.      ED Treatments / Results  Labs (all labs ordered are listed, but only abnormal results are displayed) Labs Reviewed  CBC WITH DIFFERENTIAL/PLATELET - Abnormal;  Notable for the following components:      Result Value   WBC 0.8 (*)    RBC 3.69 (*)    Hemoglobin 11.6 (*)    HCT 36.9 (*)    Neutro Abs 0.2 (*)    Lymphs Abs 0.4 (*)    All other components within normal limits  COMPREHENSIVE METABOLIC PANEL - Abnormal; Notable for the following components:   Potassium 2.8 (*)    Chloride 97 (*)    Glucose, Bld 105 (*)    Calcium 8.2 (*)    Total Protein 6.3 (*)    Albumin 2.9 (*)    Total Bilirubin 1.7 (*)    All other components within normal limits  CULTURE, BLOOD (ROUTINE X 2)  CULTURE, BLOOD (ROUTINE X 2)  URINE CULTURE  LACTIC ACID, PLASMA  INFLUENZA PANEL BY PCR (TYPE A & B)  URINALYSIS, ROUTINE W REFLEX MICROSCOPIC  PATHOLOGIST SMEAR REVIEW    EKG None  Radiology Dg Chest 2 View  Result Date: 05/15/2018 CLINICAL DATA:  Cough and shortness of breath EXAM: CHEST - 2 VIEW COMPARISON:  February 09, 2013 chest radiograph and chest CT February 13, 2012 FINDINGS: There is no appreciable edema or consolidation. Emphysematous change in the upper lobes is noted, better appreciated on prior CT. Heart size and pulmonary vascularity within normal limits. No adenopathy. There is aortic atherosclerosis. No bone lesions. IMPRESSION: Aortic Atherosclerosis (ICD10-I70.0) and Emphysema (ICD10-J43.9). No edema or consolidation. Heart size within normal limits. No  evident adenopathy. Electronically Signed   By: Lowella Grip III M.D.   On: 05/15/2018 13:26    Procedures Procedures (including critical care time)  Medications Ordered in ED Medications  potassium chloride 10 mEq in 100 mL IVPB (10 mEq Intravenous New Bag/Given 05/15/18 1418)  sodium chloride 0.9 % bolus 1,000 mL (1,000 mLs Intravenous New Bag/Given 05/15/18 1419)  potassium chloride SA (K-DUR,KLOR-CON) CR tablet 40 mEq (40 mEq Oral Given 05/15/18 1421)     Initial Impression / Assessment and Plan / ED Course  I have reviewed the triage vital signs and the nursing notes.  Pertinent labs & imaging results that were available during my care of the patient were reviewed by me and considered in my medical decision making (see chart for details).    CRITICAL CARE Performed by: Milton Ferguson Total critical care time:66minutes Critical care time was exclusive of separately billable procedures and treating other patients. Critical care was necessary to treat or prevent imminent or life-threatening deterioration. Critical care was time spent personally by me on the following activities: development of treatment plan with patient and/or surrogate as well as nursing, discussions with consultants, evaluation of patient's response to treatment, examination of patient, obtaining history from patient or surrogate, ordering and performing treatments and interventions, ordering and review of laboratory studies, ordering and review of radiographic studies, pulse oximetry and re-evaluation of patient's condition.    Patient will be admitted for persistent cough hypoxia and leukopenia   Final Clinical Impressions(s) / ED Diagnoses   Final diagnoses:  None    ED Discharge Orders    None       Milton Ferguson, MD 05/15/18 1428

## 2018-05-15 NOTE — ED Notes (Signed)
CRITICAL VALUE STICKER  CRITICAL VALUE:  RECEIVER (on-site recipient of call): Ryan Coffey  DATE & TIME NOTIFIED: 05/15/2018 1357  MESSENGER (representative from lab): Lenord Carbo  MD NOTIFIED: Roderic Palau  TIME OF NOTIFICATION: 1400  RESPONSE: awaiting orders

## 2018-05-15 NOTE — Progress Notes (Addendum)
Pharmacy Antibiotic Note  Ryan Coffey is a 85 y.o. male admitted on 05/15/2018 with sepsis and febrile neutropenia. Pharmacy has been consulted for vanc/cefepime dosing. Flagyl dosing per Md  Plan:  Vanc 1500mg  x 1 then 1250mg  IV q24 - goal AUC 400-550  Cefepime 2g IV x 1 then 2g IV q12 based on current renal function  Weight: 155 lb (70.3 kg)  Temp (24hrs), Avg:99.2 F (37.3 C), Min:99.2 F (37.3 C), Max:99.2 F (37.3 C)  Recent Labs  Lab 05/15/18 1245 05/15/18 1312  WBC  --  0.8*  CREATININE  --  1.01  LATICACIDVEN 1.2  --     Estimated Creatinine Clearance: 53.6 mL/min (by C-G formula based on SCr of 1.01 mg/dL).    No Known Allergies   Thank you for allowing pharmacy to be a part of this patient's care.  Kara Mead 05/15/2018 2:53 PM

## 2018-05-15 NOTE — ED Notes (Signed)
Pt provided urinal, informed of need for urine sample.

## 2018-05-15 NOTE — ED Notes (Signed)
Bed: [facility] Expected date:  Expected time:  Means of arrival:  Comments: EMS-cough

## 2018-05-15 NOTE — ED Triage Notes (Signed)
Pt arrives via EMS from Home. Pr reports a persistent cough since Christmas. Pt reports worsening cough and malaise. Pt has been seen for cough but no dx delivered for persistent cough. Pt s O2 88% on RA. Pt does not typically wear O2 at home.

## 2018-05-15 NOTE — Progress Notes (Signed)
CRITICAL VALUE ALERT  Critical Value:  LA 2.3   Date & Time Notied:  05/15/2018   Provider Notified: Dia Crawford   Orders Received/Actions taken: Fluid bolus, Cardiac monitoring, serial lactic acids  Keven Soucy, Barbee Shropshire, RN

## 2018-05-15 NOTE — H&P (Signed)
History and Physical    Ryan Coffey FBP:[id number] DOB: September 05, 1932 DOA: 05/15/2018  PCP: Prince Solian, MD Patient coming from: Home  Chief Complaint: Generalized weakness  HPI: Ryan Coffey is a 85 y.o. male with medical history significant of with past medical history of renal cell carcinoma in remission, COPD, hyperlipidemia, seropositive rheumatoid arthritis reports of cough for the past 6 months.  He visited someone's house party over the weekend but denies any particular sick contacts but since then he is had worsening of cough, subjective chills, lethargy and weakness.  States he is usually a very active guy but has not felt like doing much over the course of these days.  Reports his renal cell carcinoma is in remission.  Denies any other known sick contacts, recent travel.  In the ER patient was noted to be neutropenic and hypokalemic.  Medical team was requested to admit the patient.  When I saw the patient at bedside he appeared generally weak and reported of cough but no other complaints.  Review of Systems: As per HPI otherwise 10 point review of systems negative.  Review of Systems Otherwise negative except as per HPI, including: General: Denies fever, chills, night sweats or unintended weight loss. Resp: Denies cough, wheezing, shortness of breath. Cardiac: Denies chest pain, palpitations, orthopnea, paroxysmal nocturnal dyspnea. GI: Denies abdominal pain, nausea, vomiting, diarrhea or constipation GU: Denies dysuria, frequency, hesitancy or incontinence MS: Denies muscle aches, joint pain or swelling Neuro: Denies headache, neurologic deficits (focal weakness, numbness, tingling), abnormal gait Psych: Denies anxiety, depression, SI/HI/AVH Skin: Denies new rashes or lesions ID: Denies sick contacts, exotic exposures, travel  Past Medical History:  Diagnosis Date  . Arthritis   . Benign tumor of pituitary gland ([place])    "wasn't able to get it all"  . Pneumonia  06/27/11   "first time"  . Shingles     Past Surgical History:  Procedure Laterality Date  . CHOLECYSTECTOMY OPEN  2010  . KIDNEY SURGERY  2011   "lesion removed right side"  . RENAL BIOPSY  2012   right  . TRANSPHENOIDAL / TRANSNASAL HYPOPHYSECTOMY / RESECTION PITUITARY TUMOR  ~ 1979   "they couldn't get it all"    SOCIAL HISTORY:  reports that he quit smoking about 37 years ago. His smoking use included cigarettes. He has a 5.00 pack-year smoking history. He has never used smokeless tobacco. He reports current alcohol use of about 3.0 standard drinks of alcohol per week. He reports that he does not use drugs.  No Known Allergies  FAMILY HISTORY: Family History  Problem Relation Age of Onset  . Heart disease Mother   . Cancer Brother   . Heart disease Brother      Prior to Admission medications   Medication Sig Start Date End Date Taking? Authorizing Provider  acetaminophen (TYLENOL) 325 MG tablet Take 650 mg by mouth 2 (two) times daily as needed (arthritis).   Yes [provider]  bromocriptine (PARLODEL) 2.5 MG tablet Take 2.5 mg by mouth 2 (two) times daily.   Yes [provider]  Calcium Carbonate-Vitamin D (CALCIUM 500 + D) 500-125 MG-UNIT TABS Take 1 tablet by mouth 2 (two) times daily.   Yes [provider]  esomeprazole (NEXIUM) 40 MG capsule Take 40 mg by mouth 2 (two) times daily. 03/20/18  Yes [provider]  folic acid (FOLVITE) 1 MG tablet TAKE 2 TABLETS EVERY DAY Patient taking differently: Take 2 mg by mouth daily.  08/15/17  Yes Deveshwar, Abel Presto, MD  hydrocortisone (CORTEF) 20 MG tablet Take 10-20 mg by mouth daily. 20 mg in the morning and 10mg  at night 11/02/16  Yes [provider]  levothyroxine (SYNTHROID, LEVOTHROID) 137 MCG tablet Take 137 mcg by mouth daily.     Yes [provider]  methotrexate (RHEUMATREX) 2.5 MG tablet TAKE 4 tablets on Saturday and 4 tablets on Sunday. Caution:Chemotherapy.  Protect from light. 03/07/18  Yes Deveshwar, Abel Presto, MD  Multiple Vitamins-Minerals (CENTRUM SILVER PO) Take 1 tablet by mouth daily.    Yes [provider]  pantoprazole (PROTONIX) 40 MG tablet Take 40 mg by mouth 2 (two) times daily.   Yes [provider]  pravastatin (PRAVACHOL) 80 MG tablet Take 80 mg by mouth daily.   Yes [provider]  testosterone cypionate (DEPOTESTOSTERONE CYPIONATE) 200 MG/ML injection Inject 75 mg into the muscle every 14 (fourteen) days.  05/24/16  Yes [provider]    Physical Exam: Vitals:   05/15/18 1149 05/15/18 1239 05/15/18 1240 05/15/18 1345  BP:  (!) 97/56  112/60  Pulse:  86 86 85  Resp:  18  20  Temp:      TempSrc:      SpO2:  90% 90% 90%  Weight: 70.3 kg         Constitutional: NAD, calm, comfortable, On 2 L nasal cannula. Eyes: PERRL, lids and conjunctivae normal ENMT: Mucous membranes are moist. Posterior pharynx clear of any exudate or lesions.Normal dentition.  Neck: normal, supple, no masses, no thyromegaly Respiratory: Mild bilateral coarse breath sounds Cardiovascular: Regular rate and rhythm, no murmurs / rubs / gallops. No extremity edema. 2+ pedal pulses. No carotid bruits.  Abdomen: no tenderness, no masses palpated. No hepatosplenomegaly. Bowel sounds positive.  Musculoskeletal: no clubbing / cyanosis. No joint deformity upper and lower extremities. Good ROM, no contractures. Normal muscle tone.  Skin: no rashes, lesions, ulcers. No induration Neurologic: CN 2-12 grossly intact. Sensation intact, DTR normal. Strength 4/5 in all 4.  Psychiatric: Normal judgment and insight. Alert and oriented x 3. Normal mood.     Labs on Admission: I have personally reviewed following labs and imaging studies  CBC: Recent Labs  Lab 05/15/18 1312  WBC 0.8*  NEUTROABS 0.2*  HGB 11.6*  HCT 36.9*  MCV 100.0  PLT 259   Basic Metabolic Panel: Recent Labs  Lab 05/15/18 1312  NA 136  K 2.8*  CL 97*   CO2 28  GLUCOSE 105*  BUN 16  CREATININE 1.01  CALCIUM 8.2*   GFR: Estimated Creatinine Clearance: 53.6 mL/min (by C-G formula based on SCr of 1.01 mg/dL). Liver Function Tests: Recent Labs  Lab 05/15/18 1312  AST 24  ALT 34  ALKPHOS 53  BILITOT 1.7*  PROT 6.3*  ALBUMIN 2.9*   No results for input(s): LIPASE, AMYLASE in the last 168 hours. No results for input(s): AMMONIA in the last 168 hours. Coagulation Profile: No results for input(s): INR, PROTIME in the last 168 hours. Cardiac Enzymes: No results for input(s): CKTOTAL, CKMB, CKMBINDEX, TROPONINI in the last 168 hours. BNP (last 3 results) No results for input(s): PROBNP in the last 8760 hours. HbA1C: No results for input(s): HGBA1C in the last 72 hours. CBG: No results for input(s): GLUCAP in the last 168 hours. Lipid Profile: No results for input(s): CHOL, HDL, LDLCALC, TRIG, CHOLHDL, LDLDIRECT in the last 72 hours. Thyroid Function Tests: No results for input(s): TSH, T4TOTAL, FREET4, T3FREE, THYROIDAB in the last 72 hours.  Anemia Panel: No results for input(s): VITAMINB12, FOLATE, FERRITIN, TIBC, IRON, RETICCTPCT in the last 72 hours. Urine analysis:    Component Value Date/Time   COLORURINE DARK YELLOW 08/29/2016 0935   APPEARANCEUR CLEAR 08/29/2016 0935   LABSPEC 1.031 08/29/2016 0935   PHURINE 5.5 08/29/2016 0935   GLUCOSEU NEGATIVE 08/29/2016 0935   HGBUR NEGATIVE 08/29/2016 0935   BILIRUBINUR NEGATIVE 08/29/2016 0935   KETONESUR TRACE (A) 08/29/2016 0935   PROTEINUR TRACE (A) 08/29/2016 0935   UROBILINOGEN 1.0 05/23/2011 0953   NITRITE NEGATIVE 08/29/2016 0935   LEUKOCYTESUR NEGATIVE 08/29/2016 0935   Sepsis Labs: !!!!!!!!!!!!!!!!!!!!!!!!!!!!!!!!!!!!!!!!!!!! @LABRCNTIP (procalcitonin:4,lacticidven:4) )No results found for this or any previous visit (from the past 240 hour(s)).   Radiological Exams on Admission: Dg Chest 2 View  Result Date: 05/15/2018 CLINICAL DATA:  Cough and shortness of  breath EXAM: CHEST - 2 VIEW COMPARISON:  February 09, 2013 chest radiograph and chest CT February 13, 2012 FINDINGS: There is no appreciable edema or consolidation. Emphysematous change in the upper lobes is noted, better appreciated on prior CT. Heart size and pulmonary vascularity within normal limits. No adenopathy. There is aortic atherosclerosis. No bone lesions. IMPRESSION: Aortic Atherosclerosis (ICD10-I70.0) and Emphysema (ICD10-J43.9). No edema or consolidation. Heart size within normal limits. No evident adenopathy. Electronically Signed   By: Lowella Grip III M.D.   On: 05/15/2018 13:26     All images have been reviewed by me personally.    Assessment/Plan Principal Problem:   Sepsis ([place]) Active Problems:   HCAP (healthcare-associated pneumonia)   Seropositive rheumatoid arthritis ([place])   History of tobacco use   Hyperlipidemia   History of renal cell carcinoma   History of COPD   Neutropenia ([facility])   Hypokalemia    Neutropenic fever, sepsis on admission Moderate Neurtropenia - No obvious source of infection.  Urine cultures, blood cultures ordered.  Chest x-ray negative for any acute pathology.  Flu - neg. Check procalcitonin level. - We will do respiratory panel, droplet precaution.  CT chest -Broad-spectrum antibiotics including-vancomycin, cefepime and Flagyl - Provide supportive care, IV fluids, monitor urine output - Closely follow CBC.  Neutropenic precautions.  If does not recover will have to get hematology involved.  Hypokalemia; K 2.8 -Aggressive repletion.  Generalized weakness -Secondary to current ongoing medical issues.  PT/OT ordered.  Remote history of renal cell carcinoma, in remission according to the patient  History of COPD -Continue bronchodilators PRN.  Hypothyroidism -Continue Synthroid  History of rheumatoid arthritis, seropositive -Holding home immunosuppressive regimen including methotrexate as he is currently neutropenic and  concerns for sepsis.  GERD -Continue PPI.  DVT prophylaxis: Lovenox Code Status: Full code Family Communication: Daughter at bedside Disposition Plan: To be determined Consults called: None Admission status: Inpatient admission   Time Spent: 65 minutes.  >50% of the time was devoted to discussing the patients care, assessment, plan and disposition with other care givers along with counseling the patient about the risks and benefits of treatment.    Ankit Arsenio Loader MD [facility]  If 7PM-7AM, please contact night-coverage [email or web address]  05/15/2018, 2:54 PM

## 2018-05-16 ENCOUNTER — Inpatient Hospital Stay (HOSPITAL_COMMUNITY): Payer: Medicare Other

## 2018-05-16 LAB — BASIC METABOLIC PANEL
Anion gap: 13 (ref 5–15)
Anion gap: 9 (ref 5–15)
BUN: 15 mg/dL (ref 8–23)
BUN: 20 mg/dL (ref 8–23)
CO2: 23 mmol/L (ref 22–32)
CO2: 21 mmol/L — ABNORMAL LOW (ref 22–32)
Calcium: 7.9 mg/dL — ABNORMAL LOW (ref 8.9–10.3)
Calcium: 7.6 mg/dL — ABNORMAL LOW (ref 8.9–10.3)
Chloride: 102 mmol/L (ref 98–111)
Chloride: 106 mmol/L (ref 98–111)
Creatinine, Ser: 0.96 mg/dL (ref 0.61–1.24)
Creatinine, Ser: 1.19 mg/dL (ref 0.61–1.24)
GFR calc Af Amer: >60 mL/min (ref >60)
GFR calc Af Amer: >60 mL/min (ref >60)
GFR calc non Af Amer: >60 mL/min (ref >60)
GFR calc non Af Amer: 56 mL/min — ABNORMAL LOW (ref >60)
Glucose, Bld: 123 mg/dL — ABNORMAL HIGH (ref 70–99)
Glucose, Bld: 93 mg/dL (ref 70–99)
Potassium: 3.6 mmol/L (ref 3.5–5.1)
Potassium: 4 mmol/L (ref 3.5–5.1)
Sodium: 138 mmol/L (ref 135–145)
Sodium: 136 mmol/L (ref 135–145)

## 2018-05-16 LAB — CBC WITH DIFFERENTIAL/PLATELET
Abs Immature Granulocytes: 0.05 10*3/uL (ref 0.00–0.07)
BASOS ABS: 0 10*3/uL (ref 0.0–0.1)
Basophils Relative: 1 %
Eosinophils Absolute: 0.1 10*3/uL (ref 0.0–0.5)
Eosinophils Relative: 2 %
HCT: 34 % — ABNORMAL LOW (ref 39.0–52.0)
Hemoglobin: 10.5 g/dL — ABNORMAL LOW (ref 13.0–17.0)
Immature Granulocytes: 2 %
Lymphocytes Relative: 16 %
Lymphs Abs: 0.5 10*3/uL — ABNORMAL LOW (ref 0.7–4.0)
MCH: 31.3 pg (ref 26.0–34.0)
MCHC: 30.9 g/dL (ref 30.0–36.0)
MCV: 101.5 fL — ABNORMAL HIGH (ref 80.0–100.0)
MONO ABS: 0.3 10*3/uL (ref 0.1–1.0)
Monocytes Relative: 11 %
Neutro Abs: 2.1 10*3/uL (ref 1.7–7.7)
Neutrophils Relative %: 68 %
Platelets: 180 10*3/uL (ref 150–400)
RBC: 3.35 MIL/uL — AB (ref 4.22–5.81)
RDW: 15.7 % — ABNORMAL HIGH (ref 11.5–15.5)
WBC: 3.1 10*3/uL — AB (ref 4.0–10.5)
nRBC: 0 % (ref 0.0–0.2)

## 2018-05-16 LAB — LACTIC ACID, PLASMA
LACTIC ACID, VENOUS: 1.7 mmol/L (ref 0.5–1.9)
Lactic Acid, Venous: 1.2 mmol/L (ref 0.5–1.9)
Lactic Acid, Venous: 1.2 mmol/L (ref 0.5–1.9)

## 2018-05-16 LAB — URINE CULTURE: Culture: <10000 — AB

## 2018-05-16 LAB — MAGNESIUM
MAGNESIUM: 1.4 mg/dL — AB (ref 1.7–2.4)
Magnesium: 2.4 mg/dL (ref 1.7–2.4)

## 2018-05-16 LAB — CK: Total CK: 65 U/L (ref 49–397)

## 2018-05-16 LAB — GLUCOSE, CAPILLARY: Glucose-Capillary: 94 mg/dL (ref 70–99)

## 2018-05-16 MED ORDER — GUAIFENESIN 100 MG/5ML PO SOLN
10.0000 mL | Freq: Four times a day (QID) | ORAL | Status: DC
  Administered 2018-05-16 – 2018-05-20 (×15): 200 mg via ORAL
  Filled 2018-05-16 (×4): qty 10
  Filled 2018-05-16: qty 50
  Filled 2018-05-16 (×6): qty 10
  Filled 2018-05-16: qty 20
  Filled 2018-05-16: qty 10
  Filled 2018-05-16 (×2): qty 50

## 2018-05-16 MED ORDER — MAGNESIUM SULFATE 2 GM/50ML IV SOLN
2.0000 g | Freq: Once | INTRAVENOUS | Status: AC
  Administered 2018-05-16: 2 g via INTRAVENOUS
  Filled 2018-05-16: qty 50

## 2018-05-16 MED ORDER — HYDROCORTISONE 10 MG PO TABS
20.0000 mg | ORAL_TABLET | Freq: Every day | ORAL | Status: DC
  Administered 2018-05-16: 20 mg via ORAL
  Filled 2018-05-16: qty 2

## 2018-05-16 MED ORDER — SODIUM CHLORIDE 0.9 % IV SOLN
INTRAVENOUS | Status: DC
  Administered 2018-05-16 – 2018-05-17 (×2): via INTRAVENOUS

## 2018-05-16 MED ORDER — SODIUM CHLORIDE 0.9 % IV SOLN
INTRAVENOUS | Status: DC
  Administered 2018-05-16: 05:00:00 via INTRAVENOUS

## 2018-05-16 MED ORDER — ALBUMIN HUMAN 25 % IV SOLN
50.0000 g | Freq: Once | INTRAVENOUS | Status: AC
  Administered 2018-05-16: 50 g via INTRAVENOUS
  Filled 2018-05-16: qty 200

## 2018-05-16 MED ORDER — ENOXAPARIN SODIUM 40 MG/0.4ML ~~LOC~~ SOLN
40.0000 mg | SUBCUTANEOUS | Status: DC
  Administered 2018-05-17 – 2018-05-19 (×3): 40 mg via SUBCUTANEOUS
  Filled 2018-05-16 (×3): qty 0.4

## 2018-05-16 MED ORDER — HYDROCORTISONE 10 MG PO TABS: 10.0000 mg | ORAL_TABLET | Freq: Every day | ORAL | Status: DC

## 2018-05-16 MED ORDER — SODIUM CHLORIDE 0.9 % IV BOLUS
1000.0000 mL | Freq: Once | INTRAVENOUS | Status: AC
  Administered 2018-05-16: 1000 mL via INTRAVENOUS

## 2018-05-16 MED ORDER — HYDROCORTISONE NA SUCCINATE PF 100 MG IJ SOLR
100.0000 mg | Freq: Three times a day (TID) | INTRAMUSCULAR | Status: DC
  Administered 2018-05-16 – 2018-05-18 (×6): 100 mg via INTRAVENOUS
  Filled 2018-05-16 (×6): qty 2

## 2018-05-16 NOTE — Progress Notes (Signed)
   05/15/18 2108  MEWS Score  Resp 20  Pulse Rate 91  BP (!) 94/54 (nurse notified)  Temp 98.4 F (36.9 C)  SpO2 93 %  O2 Device Nasal Cannula  O2 Flow Rate (L/min) 3 L/min  MEWS Score  MEWS RR 0  MEWS Pulse 0  MEWS Systolic 1  MEWS LOC 0  MEWS Temp 0  MEWS Score 1  MEWS Score Color Green  MEWS Assessment  Is this an acute change? Yes  MEWS guidelines implemented *See [place]  Provider Notification  Provider Name/Title Sherral Hammers   Date Provider Notified 05/15/18  Time Provider Notified 2108  Notification Type Page  Notification Reason Change in status  Response See new orders (IVF rate change and lab recheck )  Date of Provider Response 05/15/18

## 2018-05-16 NOTE — Progress Notes (Signed)
OT Cancellation Note  Patient Details Name: Ryan Coffey MRN: [id number] DOB: [birth year]   Cancelled Treatment:    Reason Eval/Treat Not Completed: Other (comment).  Noted RN told PT to wait on therapy today. Will check back over the weekend  [facility] 05/16/2018, 2:28 PM  Lesle Chris, OTR/L Acute Rehabilitation Services [phone] [facility] pager [phone] office 05/16/2018

## 2018-05-16 NOTE — Progress Notes (Signed)
PT Cancellation Note  Patient Details Name: Ryan Coffey MRN: [id number] DOB: 1932-11-26   Cancelled Treatment:    Reason Eval/Treat Not Completed: Medical issues which prohibited therapy Pt with low BP overnight and RN reports pt will not likely tolerate OOB activity well today.  Will check back as schedule permits.   Rosalba Totty,KATHrine E 05/16/2018, 11:03 AM Carmelia Bake, PT, DPT Acute Rehabilitation Services Office: [phone] Pager: [phone]

## 2018-05-16 NOTE — Progress Notes (Signed)
   05/16/18 0206  MEWS Score  Resp 16  Pulse Rate 83  BP (!) 81/54  Temp 98.3 F (36.8 C)  SpO2 94 %  O2 Device Nasal Cannula  O2 Flow Rate (L/min) 3 L/min  MEWS Score  MEWS RR 0  MEWS Pulse 0  MEWS Systolic 1  MEWS LOC 0  MEWS Temp 0  MEWS Score 1  MEWS Score Color Green  Rapid Response Notification  Name of Rapid Response RN Notified Hans   Date Rapid Response Notified 05/16/18  Time Rapid Response Notified 0210  Provider Notification  Provider Name/Title Sherral Hammers  Date Provider Notified 05/16/18  Time Provider Notified 0215  Notification Type Page  Notification Reason Other (Comment) (No improvement in status )     05/16/18 0206  MEWS Score  Resp 16  Pulse Rate 83  BP (!) 81/54  Temp 98.3 F (36.8 C)  SpO2 94 %  O2 Device Nasal Cannula  O2 Flow Rate (L/min) 3 L/min  MEWS Score  MEWS RR 0  MEWS Pulse 0  MEWS Systolic 1  MEWS LOC 0  MEWS Temp 0  MEWS Score 1  MEWS Score Color Green  Rapid Response Notification  Name of Rapid Response RN Notified Gilmer Mor   Date Rapid Response Notified 05/16/18  Time Rapid Response Notified 0210  Provider Notification  Provider Name/Title Sherral Hammers  Date Provider Notified 05/16/18  Time Provider Notified 0215  Notification Type Page  Notification Reason Other (Comment) (No improvement in status )

## 2018-05-16 NOTE — Progress Notes (Addendum)
PROGRESS NOTE    Ryan Coffey  CHY:[id number] DOB: 1932/03/27 DOA: 05/15/2018 PCP: Prince Solian, MD   Brief Narrative: Patient is 85 year old male with past medical history of renal cell carcinoma in remission, pituitary tumor, COPD, hyperlipidemia, rheumatoid arthritis who presented to the emergency room with complaints of cough, subjective chills, lethargy and weakness.  He was also noted to be neutropenic and hypokalemic on presentation.  CT chest done in the emergency department showed bilateral multilobar pneumonia.  Started on broad-spectrum antibiotics.  Assessment & Plan:   Principal Problem:   Sepsis ([place]) Active Problems:   HCAP (healthcare-associated pneumonia)   Seropositive rheumatoid arthritis ([place])   History of tobacco use   Hyperlipidemia   History of renal cell carcinoma   History of COPD   Neutropenia ([facility])   Hypokalemia  Suspected neutropenic fever/sepsis on admission: No clear source of infection.  We will follow-up cultures.  Flu panel negative.  Elevated   procalcitonin.  Lactic acid was elevated on presentation which has  resolved after the IV fluids were given.  We will continue broad-spectrum antibiotics for now.  Healthcare associated pneumonia: Patient is immunocompromised.  History of renal cell carcinoma.  On methotrexate for rheumatoid arthritis at home.  Started on cefepime and vancomycin.  We will follow-up cultures.  Still requires oxygen supplemental oxygen for maintenance of saturation.  CT chest showed multilobar pneumonia.  Neutropenia: White cell counts improving.  We will continue to monitor.  Might be associated with methotrexate.  Hypokalemia: Resolved  Remote history of renal cell carcinoma: On remission.  History of COPD: Continue present bronchodilators  History of rheumatoid arthritis: On methotrexate at home.  Currently on hold.  Follows with rheumatologist  GERD: Continue PPI  History of pituitary adenoma/adrenal  insufficiency: Continue bromocriptine and hydrocortisone.  Goitre: CT imagins showed Chronic thyroid goiter. New 15 mm left lobe thyroid nodule since 2013 is probably benign but meets consensus criteria for follow-up Thyroid Ultrasound.  Generalized weakness/debility: We will request for PT/OT evaluation         DVT prophylaxis: Lovenox Code Status: Full Family Communication: Family present at the bedside  disposition Plan: Home after full work-up   Consultants: None  Procedures: None  Antimicrobials:  Anti-infectives (From admission, onward)   Start     Dose/Rate Route Frequency Ordered Stop   05/16/18 1600  vancomycin (VANCOCIN) 1,250 mg in sodium chloride 0.9 % 250 mL IVPB     1,250 mg 166.7 mL/hr over 90 Minutes Intravenous Every 24 hours 05/15/18 1501     05/15/18 1600  metroNIDAZOLE (FLAGYL) IVPB 500 mg  Status:  Discontinued     500 mg 100 mL/hr over 60 Minutes Intravenous Every 8 hours 05/15/18 1449 05/16/18 1158   05/15/18 1600  ceFEPIme (MAXIPIME) 2 g in sodium chloride 0.9 % 100 mL IVPB     2 g 200 mL/hr over 30 Minutes Intravenous Every 12 hours 05/15/18 1501     05/15/18 1500  ceFEPIme (MAXIPIME) 2 g in sodium chloride 0.9 % 100 mL IVPB  Status:  Discontinued     2 g 200 mL/hr over 30 Minutes Intravenous  Once 05/15/18 1449 05/15/18 1501   05/15/18 1500  vancomycin (VANCOCIN) 1,500 mg in sodium chloride 0.9 % 500 mL IVPB     1,500 mg 250 mL/hr over 120 Minutes Intravenous  Once 05/15/18 1449 05/15/18 1840      Subjective: Patient seen and examined the bedside this morning.  Currently hemodynamically stable.  Afebrile.  Complains of increased  generalized weakness and generalized body ache.  Complains of cough. Objective: Vitals:   05/16/18 0206 05/16/18 0312 05/16/18 0526 05/16/18 0934  BP: (!) 81/54 (!) 90/52 107/62 112/62  Pulse: 83 86 86 95  Resp: 16  20 16   Temp: 98.3 F (36.8 C)  98 F (36.7 C) 98.2 F (36.8 C)  TempSrc:      SpO2: 94% 94% 93%  93%  Weight:        Intake/Output Summary (Last 24 hours) at 05/16/2018 1158 Last data filed at 05/16/2018 0331 Gross per 24 hour  Intake 1935.97 ml  Output 120 ml  Net 1815.97 ml   Filed Weights   05/15/18 1149  Weight: 70.3 kg    Examination:  General exam: Not in distress,average built HEENT:PERRL,Oral mucosa moist, Ear/Nose normal on gross exam Respiratory system: Bilateral decreased air entry, crackles on the basis Cardiovascular system: S1 & S2 heard, RRR. No JVD, murmurs, rubs, gallops or clicks. No pedal edema. Gastrointestinal system: Abdomen is nondistended, soft and nontender. No organomegaly or masses felt. Normal bowel sounds heard. Central nervous system: Alert and oriented. No focal neurological deficits. Extremities: No edema, no clubbing ,no cyanosis, distal peripheral pulses palpable. Skin: No rashes, lesions or ulcers,no icterus ,no pallor MSK: Normal muscle bulk,tone ,power Psychiatry: Judgement and insight appear normal. Mood & affect appropriate.     Data Reviewed: I have personally reviewed following labs and imaging studies  CBC: Recent Labs  Lab 05/15/18 1312 05/16/18 0434  WBC 0.8* 3.1*  NEUTROABS 0.2* 2.1  HGB 11.6* 10.5*  HCT 36.9* 34.0*  MCV 100.0 101.5*  PLT 170 354   Basic Metabolic Panel: Recent Labs  Lab 05/15/18 1312 05/15/18 2239 05/16/18 0434  NA 136 138 136  K 2.8* 3.6 4.0  CL 97* 102 106  CO2 28 23 21*  GLUCOSE 105* 123* 93  BUN 16 15 20   CREATININE 1.01 0.96 1.19  CALCIUM 8.2* 7.9* 7.6*  MG  --  1.4* 2.4   GFR: Estimated Creatinine Clearance: 45.5 mL/min (by C-G formula based on SCr of 1.19 mg/dL). Liver Function Tests: Recent Labs  Lab 05/15/18 1312  AST 24  ALT 34  ALKPHOS 53  BILITOT 1.7*  PROT 6.3*  ALBUMIN 2.9*   No results for input(s): LIPASE, AMYLASE in the last 168 hours. No results for input(s): AMMONIA in the last 168 hours. Coagulation Profile: Recent Labs  Lab 05/15/18 1802  INR 1.2    Cardiac Enzymes: No results for input(s): CKTOTAL, CKMB, CKMBINDEX, TROPONINI in the last 168 hours. BNP (last 3 results) No results for input(s): PROBNP in the last 8760 hours. HbA1C: No results for input(s): HGBA1C in the last 72 hours. CBG: Recent Labs  Lab 05/16/18 0742  GLUCAP 94   Lipid Profile: No results for input(s): CHOL, HDL, LDLCALC, TRIG, CHOLHDL, LDLDIRECT in the last 72 hours. Thyroid Function Tests: No results for input(s): TSH, T4TOTAL, FREET4, T3FREE, THYROIDAB in the last 72 hours. Anemia Panel: No results for input(s): VITAMINB12, FOLATE, FERRITIN, TIBC, IRON, RETICCTPCT in the last 72 hours. Sepsis Labs: Recent Labs  Lab 05/15/18 1802 05/15/18 2217 05/16/18 0434 05/16/18 1100  PROCALCITON 1.74  --   --   --   LATICACIDVEN 2.3* 1.7 1.2 1.2    Recent Results (from the past 240 hour(s))  Respiratory Panel by PCR     Status: None   Collection Time: 05/15/18  1:12 PM  Result Value Ref Range Status   Adenovirus NOT DETECTED NOT DETECTED Final  Coronavirus 229E NOT DETECTED NOT DETECTED Final    Comment: (NOTE) The Coronavirus on the Respiratory Panel, DOES NOT test for the novel  Coronavirus (2019 nCoV)    Coronavirus HKU1 NOT DETECTED NOT DETECTED Final   Coronavirus NL63 NOT DETECTED NOT DETECTED Final   Coronavirus OC43 NOT DETECTED NOT DETECTED Final   Metapneumovirus NOT DETECTED NOT DETECTED Final   Rhinovirus / Enterovirus NOT DETECTED NOT DETECTED Final   Influenza A NOT DETECTED NOT DETECTED Final   Influenza B NOT DETECTED NOT DETECTED Final   Parainfluenza Virus 1 NOT DETECTED NOT DETECTED Final   Parainfluenza Virus 2 NOT DETECTED NOT DETECTED Final   Parainfluenza Virus 3 NOT DETECTED NOT DETECTED Final   Parainfluenza Virus 4 NOT DETECTED NOT DETECTED Final   Respiratory Syncytial Virus NOT DETECTED NOT DETECTED Final   Bordetella pertussis NOT DETECTED NOT DETECTED Final   Chlamydophila pneumoniae NOT DETECTED NOT DETECTED Final    Mycoplasma pneumoniae NOT DETECTED NOT DETECTED Final    Comment: Performed at [facility], [place] [address] [place], [place] [address]         Radiology Studies: Dg Chest 2 View  Result Date: 05/15/2018 CLINICAL DATA:  Cough and shortness of breath EXAM: CHEST - 2 VIEW COMPARISON:  February 09, 2013 chest radiograph and chest CT February 13, 2012 FINDINGS: There is no appreciable edema or consolidation. Emphysematous change in the upper lobes is noted, better appreciated on prior CT. Heart size and pulmonary vascularity within normal limits. No adenopathy. There is aortic atherosclerosis. No bone lesions. IMPRESSION: Aortic Atherosclerosis (ICD10-I70.0) and Emphysema (ICD10-J43.9). No edema or consolidation. Heart size within normal limits. No evident adenopathy. Electronically Signed   By: Lowella Grip III M.D.   On: 05/15/2018 13:26   Ct Angio Chest Pe W Or Wo Contrast  Result Date: 05/15/2018 CLINICAL DATA:  85 year old male with chest pain. Renal cell carcinoma in remission. Cough lethargy and weakness. EXAM: CT ANGIOGRAPHY CHEST WITH CONTRAST TECHNIQUE: Multidetector CT imaging of the chest was performed using the standard protocol during bolus administration of intravenous contrast. Multiplanar CT image reconstructions and MIPs were obtained to evaluate the vascular anatomy. CONTRAST:  160mL OMNIPAQUE IOHEXOL 350 MG/ML SOLN COMPARISON:  Chest radiographs earlier today. CT Chest, Abdomen, and Pelvis 02/13/2012. FINDINGS: Cardiovascular: Good contrast bolus timing in the pulmonary arterial tree. Respiratory motion in the lower lungs. No focal filling defect identified in the pulmonary arteries to suggest acute pulmonary embolism. Calcified coronary artery atherosclerosis. Intermittent Calcified aortic atherosclerosis. No cardiomegaly or pericardial effusion. Mediastinum/Nodes: No mediastinal lymphadenopathy. Chronic thyroid nodularity. Chronic isthmus nodule tracking inferiorly appears  stable since 2013. There is a new 15 millimeter left lobe nodule on series 4, image 13. Lungs/Pleura: Centrilobular emphysema. Major airways are patent. Confluent peribronchial opacity in the bilateral lower lobes on series 6, image 77. Similar but less pronounced peribronchial opacity in the inferior right middle lobe and lateral basal segment of the right lower lobe (image 115). Chronic architectural distortion along major fissure the right and in the medial right upper lobe is stable. No pleural effusion or other acute pulmonary opacity. Upper Abdomen: Negative visible liver, spleen, pancreas, and bowel in the upper abdomen. There are new small low-density areas in both upper kidneys, but these have simple fluid density and are most likely benign cysts (series 4, images 101 and 103). Each is about 2 centimeters. Musculoskeletal: Osteopenia no acute or suspicious osseous lesion identified. Review of the MIP images confirms the above findings. IMPRESSION:  1. No evidence of acute pulmonary embolus. 2. Emphysema (ICD10-J43.9) with bilateral lower lobe and right middle lobe peribronchial opacity compatible with multifocal acute infectious exacerbation. No pleural effusion. 3. Partially visible new small cystic areas in both upper kidneys. Visualized portions have simple fluid density and are most likely benign cysts. Renal mass protocol abdomen CT or MRI with characterize fully. 4. Chronic thyroid goiter. New 15 mm left lobe thyroid nodule since 2013 is probably benign but meets consensus criteria for follow-up Thyroid Ultrasound. 5. Calcified aortic and coronary artery atherosclerosis. Electronically Signed   By: Genevie Ann M.D.   On: 05/15/2018 16:29        Scheduled Meds: . bromocriptine  2.5 mg Oral BID  . calcium-vitamin D  1 tablet Oral BID  . enoxaparin (LOVENOX) injection  40 mg Subcutaneous Q24H  . folic acid  2 mg Oral Daily  . hydrocortisone  10-20 mg Oral Daily  . levothyroxine  137 mcg Oral  Q0600  . pantoprazole  40 mg Oral BID  . pravastatin  80 mg Oral Daily   Continuous Infusions: . ceFEPime (MAXIPIME) IV 2 g (05/16/18 0423)  . vancomycin       LOS: 1 day    Time spent: 35 mins.More than 50% of that time was spent in counseling and/or coordination of care.      Shelly Coss, MD [facility] Pager [phone]  If 7PM-7AM, please contact night-coverage [email or web address] Password [facility] 05/16/2018, 11:58 AM

## 2018-05-16 NOTE — Progress Notes (Signed)
Stat Lactic Acid has not resulted, when BMP resulted. Lab callled and RN was informed that during their down time the lab did not process. Lab is now running LA. Next serial LA is due to be drawn at 0400.

## 2018-05-17 LAB — CBC WITH DIFFERENTIAL/PLATELET
Abs Immature Granulocytes: 0.13 10*3/uL — ABNORMAL HIGH (ref 0.00–0.07)
BASOS PCT: 1 %
Basophils Absolute: 0.1 10*3/uL (ref 0.0–0.1)
EOS ABS: 0 10*3/uL (ref 0.0–0.5)
Eosinophils Relative: 0 %
HCT: 39 % (ref 39.0–52.0)
Hemoglobin: 11.3 g/dL — ABNORMAL LOW (ref 13.0–17.0)
IMMATURE GRANULOCYTES: 1 %
Lymphocytes Relative: 8 %
Lymphs Abs: 0.7 10*3/uL (ref 0.7–4.0)
MCH: 30.8 pg (ref 26.0–34.0)
MCHC: 29 g/dL — ABNORMAL LOW (ref 30.0–36.0)
MCV: 106.3 fL — ABNORMAL HIGH (ref 80.0–100.0)
Monocytes Absolute: 0.6 10*3/uL (ref 0.1–1.0)
Monocytes Relative: 6 %
Neutro Abs: 7.5 10*3/uL (ref 1.7–7.7)
Neutrophils Relative %: 84 %
Platelets: 358 10*3/uL (ref 150–400)
RBC: 3.67 MIL/uL — ABNORMAL LOW (ref 4.22–5.81)
RDW: 15.9 % — AB (ref 11.5–15.5)
WBC: 9 10*3/uL (ref 4.0–10.5)
nRBC: 0 % (ref 0.0–0.2)

## 2018-05-17 LAB — BASIC METABOLIC PANEL
Anion gap: 13 (ref 5–15)
BUN: 33 mg/dL — ABNORMAL HIGH (ref 8–23)
CO2: 19 mmol/L — ABNORMAL LOW (ref 22–32)
Calcium: 8.1 mg/dL — ABNORMAL LOW (ref 8.9–10.3)
Chloride: 105 mmol/L (ref 98–111)
Creatinine, Ser: 1.24 mg/dL (ref 0.61–1.24)
GFR calc Af Amer: >60 mL/min (ref >60)
GFR, EST NON AFRICAN AMERICAN: 53 mL/min — AB (ref >60)
Glucose, Bld: 128 mg/dL — ABNORMAL HIGH (ref 70–99)
Potassium: 4.6 mmol/L (ref 3.5–5.1)
Sodium: 137 mmol/L (ref 135–145)

## 2018-05-17 LAB — GLUCOSE, CAPILLARY: Glucose-Capillary: 126 mg/dL — ABNORMAL HIGH (ref 70–99)

## 2018-05-17 LAB — ALBUMIN: Albumin: 3.7 g/dL (ref 3.5–5.0)

## 2018-05-17 MED ORDER — SODIUM CHLORIDE 0.9 % IV BOLUS
1000.0000 mL | Freq: Once | INTRAVENOUS | Status: AC
  Administered 2018-05-17: 1000 mL via INTRAVENOUS

## 2018-05-17 NOTE — Progress Notes (Signed)
   05/16/18 22:40  MEWS Score  Resp 20  Pulse Rate 83  BP (!) 82/52  Temp 98.1  SpO2 72% on RA, increased to 93% on 3LNC   O2 Device  Nasal Cannula  O2 Flow Rate (L/min) 3 L/min  MEWS Score  MEWS RR 0  MEWS Pulse 0  MEWS Systolic 1  MEWS LOC 0  MEWS Temp 0  MEWS Score 1  MEWS Score Color Green             Provider Notification  Provider Name/Title Dr. Sherral Hammers  Date Provider Notified 05/16/18  Time Provider Notified 2250  Notification Type Page  Notification Reason Hypotension, hyoxia

## 2018-05-17 NOTE — Evaluation (Signed)
Physical Therapy Evaluation Patient Details Name: Ryan Coffey MRN: [id number] DOB: [birth year] Today's Date: 05/17/2018   History of Present Illness  per MD note: Patient is 85 year old male with past medical history of renal cell carcinoma in remission, pituitary tumor, COPD, hyperlipidemia, rheumatoid arthritis who presented to the emergency room with complaints of cough, subjective chills, lethargy and weakness.  He was also noted to be neutropenic and hypokalemic on presentation.  CT chest done in the emergency department showed bilateral multilobar pneumonia.  Started on broad-spectrum antibiotics.  Clinical Impression  Pt with generalized weakness and mostly limited due to SOB, dyspnea and drop on O2 sats with very little movement. Took a lot of time to educate pt and dtr with small bouts of movement throughout the day, exercises in bed and EOB. Today when standing EOB with just static standing and small marching on 3LNC drop to 85 % with sweating and dyspnea 2/4. Pt also had posterior lean with static standing, hoping this will resolved once pt is able to move around a little more. But for now need assist hands on even when standing at EOB. Dtr and pt receptive of education and will try these small things during the day. PT will continue to f/u with pt for his progress and or need for HHPT and equipment if needed. Pt has good family support when he returns home.      Follow Up Recommendations Home health PT(may or may not need HH depending on progres here)    Equipment Recommendations  None recommended by PT(will cotninue to assess needs )    Recommendations for Other Services       Precautions / Restrictions Precautions Precaution Comments: monitor BP and O2 sats Restrictions Weight Bearing Restrictions: No      Mobility  Bed Mobility Overal bed mobility: Independent                Transfers Overall transfer level: Needs assistance Equipment used: None Transfers:  Sit to/from Stand Sit to Stand: Min assist;Mod assist         General transfer comment: Min A -ModA to steady balance pt with posterior lean and trying to catch his balance upon rising needing assist to do so. Pt reports this is not normal . WE practiced static standing 2 times and marching in place stepping forward and back stopping each time to monitor drop in O2 sats and recovering with pursed lip breathing.   Ambulation/Gait Ambulation/Gait assistance: Min assist;Mod assist Gait Distance (Feet): 5 Feet Assistive device: 1 person hand held assist Gait Pattern/deviations: Wide base of support;Leaning posteriorly     General Gait Details: wide gait posterior lean  trying to recover, pt states this is not normal   Stairs            Wheelchair Mobility    Modified Rankin (Stroke Patients Only)       Balance Overall balance assessment: Needs assistance Sitting-balance support: No upper extremity supported;Feet supported Sitting balance-Leahy Scale: Good     Standing balance support: Single extremity supported Standing balance-Leahy Scale: Fair Standing balance comment: postrior lean intially trying to self recover and stil with attempting stepping and marching inplace still with posterior lean requring assist to steady                              Pertinent Vitals/Pain Pain Assessment: No/denies pain    Home Living Family/patient expects to be discharged to:: Private  residence Living Arrangements: Alone Available Help at Discharge: Family;Available PRN/intermittently Type of Home: House Home Access: Stairs to enter Entrance Stairs-Rails: Right Entrance Stairs-Number of Steps: 2-3 Home Layout: Two level;Full bath on main level;Able to live on main level with bedroom/bathroom Home Equipment: None      Prior Function Level of Independence: Independent         Comments: pt independent, drives to see his wife in memory care unit daily.      Hand  Dominance        Extremity/Trunk Assessment        Lower Extremity Assessment Lower Extremity Assessment: Generalized weakness       Communication   Communication: No difficulties  Cognition Arousal/Alertness: Awake/alert Behavior During Therapy: WFL for tasks assessed/performed Overall Cognitive Status: Within Functional Limits for tasks assessed                                        General Comments      Exercises Other Exercises Other Exercises: educated on breathing exercises, spirometer, pursed lip breathing for recovery during small bouts of movement, as well as seated knee extension and supine SLR exercises.    Assessment/Plan    PT Assessment Patient needs continued PT services  PT Problem List Decreased strength;Decreased activity tolerance;Decreased mobility       PT Treatment Interventions (if continues to have posterior lean. may need AD for home, will contionue to assess. )    PT Goals (Current goals can be found in the Care Plan section)  Acute Rehab PT Goals Patient Stated Goal: I want to get back home to being independent without Oxygen I hope!!  PT Goal Formulation: With patient Time For Goal Achievement: 05/31/18 Potential to Achieve Goals: Good    Frequency Min 3X/week   Barriers to discharge        Co-evaluation               AM-PAC PT "6 Clicks" Mobility  Outcome Measure Help needed turning from your back to your side while in a flat bed without using bedrails?: None Help needed moving from lying on your back to sitting on the side of a flat bed without using bedrails?: None Help needed moving to and from a bed to a chair (including a wheelchair)?: A Little Help needed standing up from a chair using your arms (e.g., wheelchair or bedside chair)?: A Little Help needed to walk in hospital room?: A Lot Help needed climbing 3-5 steps with a railing? : A Lot 6 Click Score: 18    End of Session Equipment Utilized  During Treatment: Gait belt Activity Tolerance: Patient tolerated treatment well(frustrated he cannot do more but limited by dsypnea and SOB and drop in 02) Patient left: in chair;with call bell/phone within reach;with family/visitor present Nurse Communication: Mobility status PT Visit Diagnosis: Muscle weakness (generalized) (M62.81)    Time: 3818-2993 PT Time Calculation (min) (ACUTE ONLY): 39 min   Charges:   PT Evaluation $PT Eval Moderate Complexity: 1 Mod PT Treatments $Therapeutic Exercise: 8-22 mins $Therapeutic Activity: 8-22 mins        Clide Dales, PT Acute Rehabilitation Services Pager: [phone] Office: [phone] 05/17/2018   Clide Dales 05/17/2018, 12:28 PM

## 2018-05-17 NOTE — Progress Notes (Signed)
PROGRESS NOTE    Ryan Coffey  ZOX:[id number] DOB: [birth year] DOA: 05/15/2018 PCP: Prince Solian, MD   Brief Narrative: Patient is 85 year old male with past medical history of renal cell carcinoma in remission, pituitary tumor, COPD, hyperlipidemia, rheumatoid arthritis who presented to the emergency room with complaints of cough, subjective chills, lethargy and weakness.  He was also noted to be neutropenic and hypokalemic on presentation.  CT chest done in the emergency department showed bilateral multilobar pneumonia.  Started on broad-spectrum antibiotics.  Assessment & Plan:   Principal Problem:   Sepsis ([place]) Active Problems:   HCAP (healthcare-associated pneumonia)   Seropositive rheumatoid arthritis ([place])   History of tobacco use   Hyperlipidemia   History of renal cell carcinoma   History of COPD   Neutropenia ([facility])   Hypokalemia  Suspected neutropenic fever/sepsis on admission: No clear source of infection.  We will follow-up cultures.  Flu panel negative.  Elevated   procalcitonin.  Lactic acid was elevated on presentation which has  resolved after the IV fluids were given.  We will continue broad-spectrum antibiotics for now.  Healthcare associated pneumonia: Patient is immunocompromised.  History of renal cell carcinoma.  On methotrexate for rheumatoid arthritis at home.  Started on cefepime and vancomycin.  We will follow-up cultures.  Still requires oxygen supplemental oxygen for maintenance of saturation.  CT chest showed multilobar pneumonia.CXR did not show any pneumonia.  Neutropenia: White cell counts improved.  We will continue to monitor.    Hypokalemia: Resolved  Remote history of renal cell carcinoma: On remission.  History of COPD: Continue present bronchodilators  History of rheumatoid arthritis: On methotrexate at home.  Currently on hold.  Follows with rheumatologist  GERD: Continue PPI  History of pituitary adenoma/adrenal insufficiency: On  bromocriptine and hydrocortisone.  Added IV hydrocortisone here.  Goitre: CT imagins showed Chronic thyroid goiter. New 15 mm left lobe thyroid nodule since 2013 is probably benign but meets consensus criteria for follow-up Thyroid Ultrasound.  Generalized weakness/debility: We will request for PT/OT evaluation         DVT prophylaxis: Lovenox Code Status: Full Family Communication: Family present at the bedside  disposition Plan: Home after full work-up, stabilization of respiratory status   Consultants: None  Procedures: None  Antimicrobials:  Anti-infectives (From admission, onward)   Start     Dose/Rate Route Frequency Ordered Stop   05/16/18 1600  vancomycin (VANCOCIN) 1,250 mg in sodium chloride 0.9 % 250 mL IVPB     1,250 mg 166.7 mL/hr over 90 Minutes Intravenous Every 24 hours 05/15/18 1501     05/15/18 1600  metroNIDAZOLE (FLAGYL) IVPB 500 mg  Status:  Discontinued     500 mg 100 mL/hr over 60 Minutes Intravenous Every 8 hours 05/15/18 1449 05/16/18 1158   05/15/18 1600  ceFEPIme (MAXIPIME) 2 g in sodium chloride 0.9 % 100 mL IVPB     2 g 200 mL/hr over 30 Minutes Intravenous Every 12 hours 05/15/18 1501     05/15/18 1500  ceFEPIme (MAXIPIME) 2 g in sodium chloride 0.9 % 100 mL IVPB  Status:  Discontinued     2 g 200 mL/hr over 30 Minutes Intravenous  Once 05/15/18 1449 05/15/18 1501   05/15/18 1500  vancomycin (VANCOCIN) 1,500 mg in sodium chloride 0.9 % 500 mL IVPB     1,500 mg 250 mL/hr over 120 Minutes Intravenous  Once 05/15/18 1449 05/15/18 1840      Subjective: Patient seen and examined the bedside this morning.  More comfortable today.  Still bothered by cough.  Still requires 3 L of oxygen for maintenance of saturation.  Blood pressure better this morning.  Objective: Vitals:   05/16/18 2236 05/16/18 2240 05/17/18 0021 05/17/18 0625  BP:  (!) 82/52 98/61 124/67  Pulse:  91 90 88  Resp:  20 20 17   Temp:  98.1 F (36.7 C)  (!) 97.3 F (36.3 C)    TempSrc:  Oral    SpO2: 93% 92% 92% 91%  Weight:      Height:        Intake/Output Summary (Last 24 hours) at 05/17/2018 1139 Last data filed at 05/17/2018 0600 Gross per 24 hour  Intake 4762.03 ml  Output --  Net 4762.03 ml   Filed Weights   05/15/18 1149 05/16/18 2000  Weight: 70.3 kg 70.3 kg    Examination:  General exam: Not in distress,average built HEENT:PERRL,Oral mucosa moist, Ear/Nose normal on gross exam Respiratory system: Bilateral decreased air entry, mild expiratory wheezes Cardiovascular system: S1 & S2 heard, RRR. No JVD, murmurs, rubs, gallops or clicks. Gastrointestinal system: Abdomen is nondistended, soft and nontender. No organomegaly or masses felt. Normal bowel sounds heard. Central nervous system: Alert and oriented. No focal neurological deficits. Extremities: No edema, no clubbing ,no cyanosis, distal peripheral pulses palpable. Skin: No rashes, lesions or ulcers,no icterus ,no pallor    Data Reviewed: I have personally reviewed following labs and imaging studies  CBC: Recent Labs  Lab 05/15/18 1312 05/16/18 0434 05/17/18 0607  WBC 0.8* 3.1* 9.0  NEUTROABS 0.2* 2.1 7.5  HGB 11.6* 10.5* 11.3*  HCT 36.9* 34.0* 39.0  MCV 100.0 101.5* 106.3*  PLT 170 180 846   Basic Metabolic Panel: Recent Labs  Lab 05/15/18 1312 05/15/18 2239 05/16/18 0434 05/17/18 0607  NA 136 138 136 137  K 2.8* 3.6 4.0 4.6  CL 97* 102 106 105  CO2 28 23 21* 19*  GLUCOSE 105* 123* 93 128*  BUN 16 15 20  33*  CREATININE 1.01 0.96 1.19 1.24  CALCIUM 8.2* 7.9* 7.6* 8.1*  MG  --  1.4* 2.4  --    GFR: Estimated Creatinine Clearance: 43.7 mL/min (by C-G formula based on SCr of 1.24 mg/dL). Liver Function Tests: Recent Labs  Lab 05/15/18 1312 05/17/18 0607  AST 24  --   ALT 34  --   ALKPHOS 53  --   BILITOT 1.7*  --   PROT 6.3*  --   ALBUMIN 2.9* 3.7   No results for input(s): LIPASE, AMYLASE in the last 168 hours. No results for input(s): AMMONIA in the  last 168 hours. Coagulation Profile: Recent Labs  Lab 05/15/18 1802  INR 1.2   Cardiac Enzymes: Recent Labs  Lab 05/16/18 1503  CKTOTAL 65   BNP (last 3 results) No results for input(s): PROBNP in the last 8760 hours. HbA1C: No results for input(s): HGBA1C in the last 72 hours. CBG: Recent Labs  Lab 05/16/18 0742 05/17/18 0828  GLUCAP 94 126*   Lipid Profile: No results for input(s): CHOL, HDL, LDLCALC, TRIG, CHOLHDL, LDLDIRECT in the last 72 hours. Thyroid Function Tests: No results for input(s): TSH, T4TOTAL, FREET4, T3FREE, THYROIDAB in the last 72 hours. Anemia Panel: No results for input(s): VITAMINB12, FOLATE, FERRITIN, TIBC, IRON, RETICCTPCT in the last 72 hours. Sepsis Labs: Recent Labs  Lab 05/15/18 1802 05/15/18 2217 05/16/18 0434 05/16/18 1100  PROCALCITON 1.74  --   --   --   LATICACIDVEN 2.3* 1.7 1.2 1.2  Recent Results (from the past 240 hour(s))  Respiratory Panel by PCR     Status: None   Collection Time: 05/15/18  1:12 PM  Result Value Ref Range Status   Adenovirus NOT DETECTED NOT DETECTED Final   Coronavirus 229E NOT DETECTED NOT DETECTED Final    Comment: (NOTE) The Coronavirus on the Respiratory Panel, DOES NOT test for the novel  Coronavirus (2019 nCoV)    Coronavirus HKU1 NOT DETECTED NOT DETECTED Final   Coronavirus NL63 NOT DETECTED NOT DETECTED Final   Coronavirus OC43 NOT DETECTED NOT DETECTED Final   Metapneumovirus NOT DETECTED NOT DETECTED Final   Rhinovirus / Enterovirus NOT DETECTED NOT DETECTED Final   Influenza A NOT DETECTED NOT DETECTED Final   Influenza B NOT DETECTED NOT DETECTED Final   Parainfluenza Virus 1 NOT DETECTED NOT DETECTED Final   Parainfluenza Virus 2 NOT DETECTED NOT DETECTED Final   Parainfluenza Virus 3 NOT DETECTED NOT DETECTED Final   Parainfluenza Virus 4 NOT DETECTED NOT DETECTED Final   Respiratory Syncytial Virus NOT DETECTED NOT DETECTED Final   Bordetella pertussis NOT DETECTED NOT DETECTED  Final   Chlamydophila pneumoniae NOT DETECTED NOT DETECTED Final   Mycoplasma pneumoniae NOT DETECTED NOT DETECTED Final    Comment: Performed at [facility], [address], [place], [place] [address]  Blood culture (routine x 2)     Status: None (Preliminary result)   Collection Time: 05/15/18  3:28 PM  Result Value Ref Range Status   Specimen Description   Final    BLOOD LEFT ANTECUBITAL Performed at [facility] [address] [place], [place] [address]    Special Requests   Final    BOTTLES DRAWN AEROBIC AND ANAEROBIC Blood Culture adequate volume Performed at [facility] [address] [place], [place] [address]    Culture   Final    NO GROWTH < 24 HOURS Performed at [facility] [address], [place], [place] [address]    Report Status PENDING  Incomplete  Blood culture (routine x 2)     Status: None (Preliminary result)   Collection Time: 05/15/18  3:28 PM  Result Value Ref Range Status   Specimen Description   Final    BLOOD RIGHT ANTECUBITAL Performed at [facility] [address], [place], [place] [address]    Special Requests   Final    BOTTLES DRAWN AEROBIC AND ANAEROBIC Blood Culture adequate volume Performed at [facility] [address] [place], [place] [address]    Culture   Final    NO GROWTH < 24 HOURS Performed at [facility] [address] [place], [place] [address]    Report Status PENDING  Incomplete  Urine culture     Status: Abnormal   Collection Time: 05/15/18  3:28 PM  Result Value Ref Range Status   Specimen Description   Final    URINE, RANDOM Performed at [facility] [address] [place], [place] [address]    Special Requests   Final    NONE Performed at [facility], [place] [address], [place], [place] [address]    Culture (A)  Final    <10,000 COLONIES/mL INSIGNIFICANT GROWTH Performed at  [facility] [address] [place],  [address]    Report Status 05/16/2018 FINAL  Final         Radiology Studies: Dg Chest 2 View  Result Date: 05/15/2018 CLINICAL DATA:  Cough and shortness of breath EXAM: CHEST - 2 VIEW COMPARISON:  February 09, 2013 chest radiograph and chest CT February 13, 2012 FINDINGS: There is no appreciable edema or consolidation. Emphysematous change in the upper lobes is noted, better appreciated on prior CT. Heart size and pulmonary vascularity within normal limits. No adenopathy. There is aortic atherosclerosis. No bone lesions. IMPRESSION: Aortic Atherosclerosis (ICD10-I70.0) and Emphysema (ICD10-J43.9). No edema or consolidation. Heart size within normal limits. No evident adenopathy. Electronically Signed   By: Lowella Grip III M.D.   On: 05/15/2018 13:26   Ct Angio Chest Pe W Or Wo Contrast  Result Date: 05/15/2018 CLINICAL DATA:  85 year old male with chest pain. Renal cell carcinoma in remission. Cough lethargy and weakness. EXAM: CT ANGIOGRAPHY CHEST WITH CONTRAST TECHNIQUE: Multidetector CT imaging of the chest was performed using the standard protocol during bolus administration of intravenous contrast. Multiplanar CT image reconstructions and MIPs were obtained to evaluate the vascular anatomy. CONTRAST:  162mL OMNIPAQUE IOHEXOL 350 MG/ML SOLN COMPARISON:  Chest radiographs earlier today. CT Chest, Abdomen, and Pelvis 02/13/2012. FINDINGS: Cardiovascular: Good contrast bolus timing in the pulmonary arterial tree. Respiratory motion in the lower lungs. No focal filling defect identified in the pulmonary arteries to suggest acute pulmonary embolism. Calcified coronary artery atherosclerosis. Intermittent Calcified aortic atherosclerosis. No cardiomegaly or pericardial effusion. Mediastinum/Nodes: No mediastinal lymphadenopathy. Chronic thyroid nodularity. Chronic isthmus nodule tracking inferiorly appears stable since 2013. There is a new 15  millimeter left lobe nodule on series 4, image 13. Lungs/Pleura: Centrilobular emphysema. Major airways are patent. Confluent peribronchial opacity in the bilateral lower lobes on series 6, image 77. Similar but less pronounced peribronchial opacity in the inferior right middle lobe and lateral basal segment of the right lower lobe (image 115). Chronic architectural distortion along major fissure the right and in the medial right upper lobe is stable. No pleural effusion or other acute pulmonary opacity. Upper Abdomen: Negative visible liver, spleen, pancreas, and bowel in the upper abdomen. There are new small low-density areas in both upper kidneys, but these have simple fluid density and are most likely benign cysts (series 4, images 101 and 103). Each is about 2 centimeters. Musculoskeletal: Osteopenia no acute or suspicious osseous lesion identified. Review of the MIP images confirms the above findings. IMPRESSION: 1. No evidence of acute pulmonary embolus. 2. Emphysema (ICD10-J43.9) with bilateral lower lobe and right middle lobe peribronchial opacity compatible with multifocal acute infectious exacerbation. No pleural effusion. 3. Partially visible new small cystic areas in both upper kidneys. Visualized portions have simple fluid density and are most likely benign cysts. Renal mass protocol abdomen CT or MRI with characterize fully. 4. Chronic thyroid goiter. New 15 mm left lobe thyroid nodule since 2013 is probably benign but meets consensus criteria for follow-up Thyroid Ultrasound. 5. Calcified aortic and coronary artery atherosclerosis. Electronically Signed   By: Genevie Ann M.D.   On: 05/15/2018 16:29   Dg Chest Port 1 View  Result Date: 05/16/2018 CLINICAL DATA:  Shortness of breath. History of pneumonia. Previous smoker. EXAM: PORTABLE CHEST 1 VIEW COMPARISON:  05/15/2018 FINDINGS: Heart size and pulmonary vascularity are normal. Emphysematous changes in the lungs. Fibrosis in the lung bases. No  consolidation or airspace disease. No blunting of costophrenic angles. No pneumothorax. Calcification and torsion of the aorta. Degenerative changes in the spine and shoulders. IMPRESSION: Emphysematous changes and fibrosis in the lungs. No evidence of active pulmonary disease. Electronically Signed   By: Gwyndolyn Saxon  Gerilyn Nestle M.D.   On: 05/16/2018 23:30        Scheduled Meds:  bromocriptine  2.5 mg Oral BID   calcium-vitamin D  1 tablet Oral BID   enoxaparin (LOVENOX) injection  40 mg Subcutaneous M08Y   folic acid  2 mg Oral Daily   guaiFENesin  10 mL Oral Q6H   hydrocortisone sod succinate (SOLU-CORTEF) inj  100 mg Intravenous Q8H   levothyroxine  137 mcg Oral Q0600   pantoprazole  40 mg Oral BID   pravastatin  80 mg Oral Daily   Continuous Infusions:  ceFEPime (MAXIPIME) IV 2 g (05/17/18 0440)   vancomycin 1,250 mg (05/16/18 1633)     LOS: 2 days    Time spent: 35 mins.More than 50% of that time was spent in counseling and/or coordination of care.      Shelly Coss, MD [facility] Pager [phone]  If 7PM-7AM, please contact night-coverage [email or web address] Password [email or web address] 05/17/2018, 11:39 AM

## 2018-05-18 LAB — BASIC METABOLIC PANEL
Anion gap: 9 (ref 5–15)
BUN: 38 mg/dL — ABNORMAL HIGH (ref 8–23)
CO2: 24 mmol/L (ref 22–32)
CREATININE: 1 mg/dL (ref 0.61–1.24)
Calcium: 8.8 mg/dL — ABNORMAL LOW (ref 8.9–10.3)
Chloride: 105 mmol/L (ref 98–111)
GFR calc Af Amer: >60 mL/min (ref >60)
Glucose, Bld: 133 mg/dL — ABNORMAL HIGH (ref 70–99)
Potassium: 4.1 mmol/L (ref 3.5–5.1)
Sodium: 138 mmol/L (ref 135–145)

## 2018-05-18 LAB — GLUCOSE, CAPILLARY: Glucose-Capillary: 149 mg/dL — ABNORMAL HIGH (ref 70–99)

## 2018-05-18 MED ORDER — METHYLPREDNISOLONE SODIUM SUCC 40 MG IJ SOLR
40.0000 mg | Freq: Two times a day (BID) | INTRAMUSCULAR | Status: DC
  Administered 2018-05-18 – 2018-05-20 (×5): 40 mg via INTRAVENOUS
  Filled 2018-05-18 (×5): qty 1

## 2018-05-18 MED ORDER — IPRATROPIUM-ALBUTEROL 0.5-2.5 (3) MG/3ML IN SOLN
3.0000 mL | Freq: Four times a day (QID) | RESPIRATORY_TRACT | Status: DC
  Administered 2018-05-18 (×3): 3 mL via RESPIRATORY_TRACT
  Filled 2018-05-18 (×3): qty 3

## 2018-05-18 MED ORDER — IPRATROPIUM-ALBUTEROL 0.5-2.5 (3) MG/3ML IN SOLN
3.0000 mL | Freq: Three times a day (TID) | RESPIRATORY_TRACT | Status: DC
  Administered 2018-05-19 – 2018-05-20 (×3): 3 mL via RESPIRATORY_TRACT
  Filled 2018-05-18 (×4): qty 3

## 2018-05-18 NOTE — Progress Notes (Signed)
Pharmacy Antibiotic Note  Ryan Coffey is a 85 y.o. male admitted on 05/15/2018 with sepsis and febrile neutropenia. Pharmacy has been consulted for vanc/cefepime dosing. Flagyl dosing per Md  Plan:  Continue Vancomycin   1250mg  IV q24 - goal AUC 400-550  Cefepime 2g IV x 1 then 2g IV q12 based on current renal function  Per MD note, plan to change to PO abx tommorow   Height: 5\' 8"  (172.7 cm) Weight: 175 lb 0.7 oz (79.4 kg) IBW/kg (Calculated) : 68.4  Temp (24hrs), Avg:98.6 F (37 C), Min:98.2 F (36.8 C), Max:98.9 F (37.2 C)  Recent Labs  Lab 05/15/18 1245 05/15/18 1312 05/15/18 1802 05/15/18 2217 05/15/18 2239 05/16/18 0434 05/16/18 1100 05/17/18 0607 05/18/18 0544  WBC  --  0.8*  --   --   --  3.1*  --  9.0  --   CREATININE  --  1.01  --   --  0.96 1.19  --  1.24 1.00  LATICACIDVEN 1.2  --  2.3* 1.7  --  1.2 1.2  --   --     Estimated Creatinine Clearance: 54.2 mL/min (by C-G formula based on SCr of 1 mg/dL).    No Known Allergies   Thank you for allowing pharmacy to be a part of this patient's care.  Royetta Asal, PharmD, BCPS Pager [phone] 05/18/2018 12:52 PM

## 2018-05-18 NOTE — Progress Notes (Signed)
PROGRESS NOTE    Ryan Coffey  YSA:[id number] DOB: June 19, 1932 DOA: 05/15/2018 PCP: Prince Solian, MD   Brief Narrative: Patient is 85 year old male with past medical history of renal cell carcinoma in remission, pituitary tumor,  hyperlipidemia, rheumatoid arthritis who presented to the emergency room with complaints of cough, subjective chills, lethargy and weakness.  He was also noted to be neutropenic and hypokalemic on presentation.  CT chest done in the emergency department showed bilateral multilobar pneumonia.  Started on broad-spectrum antibiotics.  Assessment & Plan:   Principal Problem:   Sepsis ([place]) Active Problems:   HCAP (healthcare-associated pneumonia)   Seropositive rheumatoid arthritis ([place])   History of tobacco use   Hyperlipidemia   History of renal cell carcinoma   History of COPD   Neutropenia ([facility])   Hypokalemia  Suspected neutropenic fever/sepsis on admission:Cultures negative so far. Flu panel negative.  Elevated   procalcitonin on presentation.  Lactic acid was elevated on presentation which has  resolved after the IV fluids were given.  We will continue broad-spectrum antibiotics for now.Change Abx to oral tomorrow.  Healthcare associated pneumonia: Patient is immunocompromised.  History of renal cell carcinoma.  On methotrexate for rheumatoid arthritis at home.  Started on cefepime and vancomycin.  Still requires oxygen supplemental oxygen for maintenance of saturation.  CT chest showed multilobar pneumonia.CXR did not show any pneumonia.  COPD, new diagnosis: Chest imaging suggestive of this.  Patient has bilateral expiratory wheezes.  Previous smoker but no documented history of COPD.  Continue bronchodilators and steroids.  Neutropenia: White cell counts improved.  We will continue to monitor.    Hypokalemia: Resolved  Remote history of renal cell carcinoma: On remission.  History of rheumatoid arthritis: On methotrexate at home.  Currently on  hold.  Follows with rheumatologist.  Has joint  deformities on his hands.  GERD: Continue PPI  History of pituitary adenoma/adrenal insufficiency: On bromocriptine and hydrocortisone at home.   Goitre: CT imagins showed Chronic thyroid goiter. New 15 mm left lobe thyroid nodule since 2013 is probably benign but meets consensus criteria for follow-up Thyroid Ultrasound.  Generalized weakness/debility: OT recommending Home health OT.         DVT prophylaxis: Lovenox Code Status: Full Family Communication: Family present at the bedside  disposition Plan: Home after  stabilization of respiratory status   Consultants: None  Procedures: None  Antimicrobials:  Anti-infectives (From admission, onward)   Start     Dose/Rate Route Frequency Ordered Stop   05/16/18 1600  vancomycin (VANCOCIN) 1,250 mg in sodium chloride 0.9 % 250 mL IVPB     1,250 mg 166.7 mL/hr over 90 Minutes Intravenous Every 24 hours 05/15/18 1501     05/15/18 1600  metroNIDAZOLE (FLAGYL) IVPB 500 mg  Status:  Discontinued     500 mg 100 mL/hr over 60 Minutes Intravenous Every 8 hours 05/15/18 1449 05/16/18 1158   05/15/18 1600  ceFEPIme (MAXIPIME) 2 g in sodium chloride 0.9 % 100 mL IVPB     2 g 200 mL/hr over 30 Minutes Intravenous Every 12 hours 05/15/18 1501     05/15/18 1500  ceFEPIme (MAXIPIME) 2 g in sodium chloride 0.9 % 100 mL IVPB  Status:  Discontinued     2 g 200 mL/hr over 30 Minutes Intravenous  Once 05/15/18 1449 05/15/18 1501   05/15/18 1500  vancomycin (VANCOCIN) 1,500 mg in sodium chloride 0.9 % 500 mL IVPB     1,500 mg 250 mL/hr over 120 Minutes Intravenous  Once 05/15/18 1449 05/15/18 1840      Subjective: Patient seen and examined the bedside this morning.  Hemodynamically stable.  Blood pressure much better today.  Still has mild respiratory distress.  Cough has improved. Objective: Vitals:   05/17/18 1530 05/17/18 2141 05/18/18 0448 05/18/18 0504  BP:  112/73  120/73  Pulse: 95 (!)  103  96  Resp: 18 17  18   Temp: 98.2 F (36.8 C) 98.6 F (37 C)  98.9 F (37.2 C)  TempSrc: Oral Oral  Oral  SpO2: 90% 93% 95% 98%  Weight:    79.4 kg  Height:        Intake/Output Summary (Last 24 hours) at 05/18/2018 1026 Last data filed at 05/18/2018 0947 Gross per 24 hour  Intake 2928.49 ml  Output 525 ml  Net 2403.49 ml   Filed Weights   05/15/18 1149 05/16/18 2000 05/18/18 0504  Weight: 70.3 kg 70.3 kg 79.4 kg    Examination:  General exam: Not in distress,average built HEENT:PERRL,Oral mucosa moist, Ear/Nose normal on gross exam Respiratory system: Bilateral decreased air entry, mild expiratory wheezes Cardiovascular system: S1 & S2 heard, RRR. No JVD, murmurs, rubs, gallops or clicks. Gastrointestinal system: Abdomen is nondistended, soft and nontender. No organomegaly or masses felt. Normal bowel sounds heard. Central nervous system: Alert and oriented. No focal neurological deficits. Extremities: No edema, no clubbing ,no cyanosis, distal peripheral pulses palpable. Skin: No rashes or ulcers,no icterus ,no pallor    Data Reviewed: I have personally reviewed following labs and imaging studies  CBC: Recent Labs  Lab 05/15/18 1312 05/16/18 0434 05/17/18 0607  WBC 0.8* 3.1* 9.0  NEUTROABS 0.2* 2.1 7.5  HGB 11.6* 10.5* 11.3*  HCT 36.9* 34.0* 39.0  MCV 100.0 101.5* 106.3*  PLT 170 180 747   Basic Metabolic Panel: Recent Labs  Lab 05/15/18 1312 05/15/18 2239 05/16/18 0434 05/17/18 0607 05/18/18 0544  NA 136 138 136 137 138  K 2.8* 3.6 4.0 4.6 4.1  CL 97* 102 106 105 105  CO2 28 23 21* 19* 24  GLUCOSE 105* 123* 93 128* 133*  BUN 16 15 20  33* 38*  CREATININE 1.01 0.96 1.19 1.24 1.00  CALCIUM 8.2* 7.9* 7.6* 8.1* 8.8*  MG  --  1.4* 2.4  --   --    GFR: Estimated Creatinine Clearance: 54.2 mL/min (by C-G formula based on SCr of 1 mg/dL). Liver Function Tests: Recent Labs  Lab 05/15/18 1312 05/17/18 0607  AST 24  --   ALT 34  --   ALKPHOS 53   --   BILITOT 1.7*  --   PROT 6.3*  --   ALBUMIN 2.9* 3.7   No results for input(s): LIPASE, AMYLASE in the last 168 hours. No results for input(s): AMMONIA in the last 168 hours. Coagulation Profile: Recent Labs  Lab 05/15/18 1802  INR 1.2   Cardiac Enzymes: Recent Labs  Lab 05/16/18 1503  CKTOTAL 65   BNP (last 3 results) No results for input(s): PROBNP in the last 8760 hours. HbA1C: No results for input(s): HGBA1C in the last 72 hours. CBG: Recent Labs  Lab 05/16/18 0742 05/17/18 0828 05/18/18 0753  GLUCAP 94 126* 149*   Lipid Profile: No results for input(s): CHOL, HDL, LDLCALC, TRIG, CHOLHDL, LDLDIRECT in the last 72 hours. Thyroid Function Tests: No results for input(s): TSH, T4TOTAL, FREET4, T3FREE, THYROIDAB in the last 72 hours. Anemia Panel: No results for input(s): VITAMINB12, FOLATE, FERRITIN, TIBC, IRON, RETICCTPCT in the last 72  hours. Sepsis Labs: Recent Labs  Lab 05/15/18 1802 05/15/18 2217 05/16/18 0434 05/16/18 1100  PROCALCITON 1.74  --   --   --   LATICACIDVEN 2.3* 1.7 1.2 1.2    Recent Results (from the past 240 hour(s))  Respiratory Panel by PCR     Status: None   Collection Time: 05/15/18  1:12 PM  Result Value Ref Range Status   Adenovirus NOT DETECTED NOT DETECTED Final   Coronavirus 229E NOT DETECTED NOT DETECTED Final    Comment: (NOTE) The Coronavirus on the Respiratory Panel, DOES NOT test for the novel  Coronavirus (2019 nCoV)    Coronavirus HKU1 NOT DETECTED NOT DETECTED Final   Coronavirus NL63 NOT DETECTED NOT DETECTED Final   Coronavirus OC43 NOT DETECTED NOT DETECTED Final   Metapneumovirus NOT DETECTED NOT DETECTED Final   Rhinovirus / Enterovirus NOT DETECTED NOT DETECTED Final   Influenza A NOT DETECTED NOT DETECTED Final   Influenza B NOT DETECTED NOT DETECTED Final   Parainfluenza Virus 1 NOT DETECTED NOT DETECTED Final   Parainfluenza Virus 2 NOT DETECTED NOT DETECTED Final   Parainfluenza Virus 3 NOT DETECTED  NOT DETECTED Final   Parainfluenza Virus 4 NOT DETECTED NOT DETECTED Final   Respiratory Syncytial Virus NOT DETECTED NOT DETECTED Final   Bordetella pertussis NOT DETECTED NOT DETECTED Final   Chlamydophila pneumoniae NOT DETECTED NOT DETECTED Final   Mycoplasma pneumoniae NOT DETECTED NOT DETECTED Final    Comment: Performed at [facility], [place]. [address] [place], [place] [address]  Blood culture (routine x 2)     Status: None (Preliminary result)   Collection Time: 05/15/18  3:28 PM  Result Value Ref Range Status   Specimen Description   Final    BLOOD LEFT ANTECUBITAL Performed at [facility] [address], [place], [place] [address]    Special Requests   Final    BOTTLES DRAWN AEROBIC AND ANAEROBIC Blood Culture adequate volume Performed at [facility] [address] [place], [place] [address]    Culture   Final    NO GROWTH 2 DAYS Performed at [facility] [address] [place], [place] [address]    Report Status PENDING  Incomplete  Blood culture (routine x 2)     Status: None (Preliminary result)   Collection Time: 05/15/18  3:28 PM  Result Value Ref Range Status   Specimen Description   Final    BLOOD RIGHT ANTECUBITAL Performed at [facility] [address], [place], [place] [address]    Special Requests   Final    BOTTLES DRAWN AEROBIC AND ANAEROBIC Blood Culture adequate volume Performed at [facility] [address] [place], [place] [address]    Culture   Final    NO GROWTH 2 DAYS Performed at [facility] [address] [place], [place] [address]    Report Status PENDING  Incomplete  Urine culture     Status: Abnormal   Collection Time: 05/15/18  3:28 PM  Result Value Ref Range Status   Specimen Description   Final    URINE, RANDOM Performed at [facility] [address] [place], [place] [address]    Special Requests   Final      NONE Performed at [facility], [place] [address], [place], [place] [address]    Culture (A)  Final    <10,000 COLONIES/mL INSIGNIFICANT GROWTH Performed at  [facility], [place] [address] [place], [place] [address]    Report Status 05/16/2018 FINAL  Final         Radiology Studies: Dg Chest Port 1 View  Result Date: 05/16/2018 CLINICAL DATA:  Shortness of breath. History of pneumonia. Previous smoker. EXAM: PORTABLE CHEST 1 VIEW COMPARISON:  05/15/2018 FINDINGS: Heart size and pulmonary vascularity are normal. Emphysematous changes in the lungs. Fibrosis in the lung bases. No consolidation or airspace disease. No blunting of costophrenic angles. No pneumothorax. Calcification and torsion of the aorta. Degenerative changes in the spine and shoulders. IMPRESSION: Emphysematous changes and fibrosis in the lungs. No evidence of active pulmonary disease. Electronically Signed   By: Lucienne Capers M.D.   On: 05/16/2018 23:30        Scheduled Meds:  bromocriptine  2.5 mg Oral BID   calcium-vitamin D  1 tablet Oral BID   enoxaparin (LOVENOX) injection  40 mg Subcutaneous N18Z   folic acid  2 mg Oral Daily   guaiFENesin  10 mL Oral Q6H   ipratropium-albuterol  3 mL Nebulization Q6H   levothyroxine  137 mcg Oral Q0600   methylPREDNISolone (SOLU-MEDROL) injection  40 mg Intravenous BID   pantoprazole  40 mg Oral BID   pravastatin  80 mg Oral Daily   Continuous Infusions:  ceFEPime (MAXIPIME) IV 2 g (05/18/18 0436)   vancomycin Stopped (05/17/18 1610)     LOS: 3 days    Time spent: 25 mins.More than 50% of that time was spent in counseling and/or coordination of care.      Shelly Coss, MD [facility] Pager [phone]  If 7PM-7AM, please contact night-coverage [email or web address] Password [facility] 05/18/2018, 10:26 AM

## 2018-05-18 NOTE — Evaluation (Signed)
Occupational Therapy Evaluation Patient Details Name: Ryan Coffey MRN: [id number] DOB: 09-02-1932 Today's Date: 05/18/2018    History of Present Illness per MD note: Patient is 85 year old male with past medical history of renal cell carcinoma in remission, pituitary tumor, COPD, hyperlipidemia, rheumatoid arthritis who presented to the emergency room with complaints of cough, subjective chills, lethargy and weakness.  He was also noted to be neutropenic and hypokalemic on presentation.  CT chest done in the emergency department showed bilateral multilobar pneumonia.  Started on broad-spectrum antibiotics.   Clinical Impression   PATIENT  AND FAMILY WERE EDUCATED ON ENERGY CONSERVATION FOR ADLS, MOBILITY, AND LIGHT IADLS. AND WILL NEED FURTHER EDUCATION. PATIENT DAUGHTERS STATE THEY WILL PROVIDE ADDITIONAL ASSIST AT HOME UNTIL HIS STRENGTH RETURNS.     Follow Up Recommendations  Home health OT    Equipment Recommendations  None recommended by OT(ED PATIENT AND CHILDREN ABOUT TOILET RAILS AND GRAB BAR )    Recommendations for Other Services       Precautions / Restrictions Precautions Precautions: Fall Precaution Comments: monitor BP and O2 sats Restrictions Weight Bearing Restrictions: No      Mobility Bed Mobility Overal bed mobility: Independent                Transfers       Sit to Stand: Min guard         General transfer comment: MIN GUARD ASSIST    Balance                                           ADL either performed or assessed with clinical judgement   ADL Overall ADL's : Needs assistance/impaired Eating/Feeding: Independent   Grooming: Wash/dry hands;Wash/dry face;Supervision/safety;Set up   Upper Body Bathing: Supervision/ safety;Set up;Sitting   Lower Body Bathing: Supervison/ safety;Set up;Min guard;Sit to/from stand   Upper Body Dressing : Supervision/safety;Set up;Sitting   Lower Body Dressing: Min guard;Sit  to/from stand   Toilet Transfer: Designer, fashion/clothing and Hygiene: Min guard       Functional mobility during ADLs: Min guard General ADL Comments: PATIENT WAS EDUCATED ON ENERGY CONSERVATION AND FURTHER ED NEEDED      Vision Baseline Vision/History: Wears glasses Wears Glasses: At all times Patient Visual Report: No change from baseline       Perception     Praxis      Pertinent Vitals/Pain Pain Assessment: No/denies pain     Hand Dominance Right   Extremity/Trunk Assessment Upper Extremity Assessment Upper Extremity Assessment: Generalized weakness(patient has b hand contractures from arthris)           Communication Communication Communication: No difficulties   Cognition Arousal/Alertness: Awake/alert Behavior During Therapy: WFL for tasks assessed/performed Overall Cognitive Status: Within Functional Limits for tasks assessed                                     General Comments       Exercises     Shoulder Instructions      Home Living Family/patient expects to be discharged to:: Private residence Living Arrangements: Alone Available Help at Discharge: Family;Available PRN/intermittently Type of Home: House Home Access: Stairs to enter [employer] of Steps: 2-3 Entrance Stairs-Rails: Right Home Layout: Two level;Full bath on main level;Able to  live on main level with bedroom/bathroom Alternate Level Stairs-Number of Steps: upstairs is his work out room    [employer] Shower/Tub: Occupational psychologist: Handicapped height     Home Equipment: Shower seat;Grab bars - tub/shower;Hand held shower head          Prior Functioning/Environment Level of Independence: Independent                 OT Problem List: Decreased activity tolerance;Decreased knowledge of use of DME or AE;Cardiopulmonary status limiting activity      OT Treatment/Interventions:      OT Goals(Current goals  can be found in the care plan section) Acute Rehab OT Goals Patient Stated Goal: PATIENT WANTS TO D/C HOME OT Goal Formulation: With patient/family Time For Goal Achievement: 06/01/18 Potential to Achieve Goals: Good  OT Frequency:     Barriers to D/C:            Co-evaluation              AM-PAC OT "6 Clicks" Daily Activity     Outcome Measure Help from another person eating meals?: None Help from another person taking care of personal grooming?: A Little Help from another person toileting, which includes using toliet, bedpan, or urinal?: A Little Help from another person bathing (including washing, rinsing, drying)?: A Little Help from another person to put on and taking off regular upper body clothing?: A Little Help from another person to put on and taking off regular lower body clothing?: A Little 6 Click Score: 19   End of Session Equipment Utilized During Treatment: Oxygen Nurse Communication: (OK THERAPY)  Activity Tolerance: Patient tolerated treatment well Patient left: in chair;with call bell/phone within reach;with nursing/sitter in room;with family/visitor present  OT Visit Diagnosis: Unsteadiness on feet (R26.81);Muscle weakness (generalized) (M62.81)                Time: 5188-4166 OT Time Calculation (min): 29 min Charges:  OT General Charges $OT Visit: 1 Visit OT Evaluation $OT Eval Low Complexity: 1 Low  6 CLICKS  Tyrhonda Georgiades 05/18/2018, 9:48 AM

## 2018-05-19 LAB — BASIC METABOLIC PANEL
ANION GAP: 10 (ref 5–15)
BUN: 27 mg/dL — ABNORMAL HIGH (ref 8–23)
CO2: 21 mmol/L — ABNORMAL LOW (ref 22–32)
Calcium: 8.7 mg/dL — ABNORMAL LOW (ref 8.9–10.3)
Chloride: 106 mmol/L (ref 98–111)
Creatinine, Ser: 0.91 mg/dL (ref 0.61–1.24)
GFR calc Af Amer: >60 mL/min (ref >60)
GFR calc non Af Amer: >60 mL/min (ref >60)
Glucose, Bld: 141 mg/dL — ABNORMAL HIGH (ref 70–99)
POTASSIUM: 4 mmol/L (ref 3.5–5.1)
Sodium: 137 mmol/L (ref 135–145)

## 2018-05-19 MED ORDER — SENNOSIDES-DOCUSATE SODIUM 8.6-50 MG PO TABS: 2.0000 | ORAL_TABLET | Freq: Every evening | ORAL | Status: DC | PRN

## 2018-05-19 MED ORDER — TRAZODONE HCL 50 MG PO TABS
50.0000 mg | ORAL_TABLET | Freq: Every evening | ORAL | Status: DC | PRN
  Administered 2018-05-20: 50 mg via ORAL
  Filled 2018-05-19: qty 1

## 2018-05-19 MED ORDER — LEVOFLOXACIN 750 MG PO TABS
750.0000 mg | ORAL_TABLET | Freq: Every day | ORAL | Status: DC
  Administered 2018-05-19 – 2018-05-20 (×2): 750 mg via ORAL
  Filled 2018-05-19 (×2): qty 1

## 2018-05-19 MED ORDER — BENZONATATE 100 MG PO CAPS
200.0000 mg | ORAL_CAPSULE | Freq: Three times a day (TID) | ORAL | Status: DC
  Administered 2018-05-19 (×2): 200 mg via ORAL
  Filled 2018-05-19 (×4): qty 2

## 2018-05-19 NOTE — Progress Notes (Signed)
Physical Therapy Treatment Patient Details Name: Ryan Coffey MRN: [id number] DOB: 1933/01/17 Today's Date: 05/19/2018    History of Present Illness per MD note: Patient is 85 year old male with past medical history of renal cell carcinoma in remission, pituitary tumor, COPD, hyperlipidemia, rheumatoid arthritis who presented to the emergency room with complaints of cough, subjective chills, lethargy and weakness.  He was also noted to be neutropenic and hypokalemic on presentation.  CT chest done in the emergency department showed bilateral multilobar pneumonia.  Started on broad-spectrum antibiotics.    PT Comments    Pt feeling better.  Daughter in room.  Pt tolerated amb a greater distance on 1 lt oxygen.  Avg sats 91%.  Mild cough.   Pt lives home alone.  Plan to return home with support from daughter.  Follow Up Recommendations   none     Equipment Recommendations    none "I can use my wifes walker" if needed   Recommendations for Other Services       Precautions / Restrictions Precautions Precautions: Fall Precaution Comments: monitor sats Restrictions Weight Bearing Restrictions: No    Mobility  Bed Mobility Overal bed mobility: Modified Independent             General bed mobility comments: able with increased time  Transfers Overall transfer level: Needs assistance Equipment used: None Transfers: Sit to/from Stand;Stand Pivot Transfers Sit to Stand: Supervision;Min guard Stand pivot transfers: Supervision;Min guard       General transfer comment: safety  Ambulation/Gait Ambulation/Gait assistance: Supervision;Min guard Gait Distance (Feet): 75 Feet Assistive device: Rolling walker (2 wheeled) Gait Pattern/deviations: Step-through pattern Gait velocity: decreased    General Gait Details: remained on 1 lt O2 sats 91% HR 127.  Tolerated an increased distance.  Mild cough   Stairs             Wheelchair Mobility    Modified Rankin  (Stroke Patients Only)       Balance                                            Cognition Arousal/Alertness: Awake/alert Behavior During Therapy: WFL for tasks assessed/performed Overall Cognitive Status: Within Functional Limits for tasks assessed                                 General Comments: feeling better      Exercises      General Comments        Pertinent Vitals/Pain Pain Assessment: No/denies pain    Home Living                      Prior Function            PT Goals (current goals can now be found in the care plan section)      Frequency           PT Plan      Co-evaluation              AM-PAC PT "6 Clicks" Mobility   Outcome Measure                   End of Session               Time: 3235-5732 PT Time Calculation (  min) (ACUTE ONLY): 25 min  Charges:  $Gait Training: 8-22 mins $Therapeutic Activity: 8-22 mins                     Rica Koyanagi  PTA Acute  Rehabilitation Services Pager      [phone] Office      [phone]

## 2018-05-19 NOTE — TOC Progression Note (Addendum)
Transition of Care [facility]) - Progression Note    Patient Details  Name: Ryan Coffey MRN: [id number] Date of Birth: 1932-10-03  Transition of [facility]) CM/SW Contact  Leeroy Cha, RN Phone Number: 05/19/2018, 1:56 PM  Clinical Narrative:    Spoke with the daughter that lives here in [place]/ patient lives in [place] and number given for the daughter to call me when they decide which address they are going to be at.  Does not care about which hhc ofr p.t. will find out who can cover.  May need hospital bed. tcf-Daughter will go home to his address will not need hospital bed .   Expected Discharge Plan: [place] Barriers to Discharge: No Barriers Identified  Expected Discharge Plan and Services Expected Discharge Plan: [place] Choice: Durable Medical Equipment, Home Health Living arrangements for the past 2 months: Single Family Home Expected Discharge Date: (unknown)                   HH Arranged: PT     Social Determinants of Health (SDOH) Interventions    Readmission Risk Interventions 30 Day Unplanned Readmission Risk Score     ED to Hosp-Admission (Current) from 05/15/2018 in [place] [facility]  30 Day Unplanned Readmission Risk Score (%)  16 Filed at 05/19/2018 1200     This score is the patient's risk of an unplanned readmission within 30 days of being discharged (0 -100%). The score is based on dignosis, age, lab data, medications, orders, and past utilization.   Low:  0-14.9   Medium: 15-21.9   High: 22-29.9   Extreme: 30 and above       No flowsheet data found.

## 2018-05-19 NOTE — Progress Notes (Signed)
PROGRESS NOTE    Ryan Coffey  MWU:[id number] DOB: Mar 05, 1933 DOA: 05/15/2018 PCP: Prince Solian, MD   Brief Narrative: Patient is 85 year old male with past medical history of renal cell carcinoma in remission, pituitary tumor,  hyperlipidemia, rheumatoid arthritis who presented to the emergency room with complaints of cough, subjective chills, lethargy and weakness.  He was also noted to be neutropenic and hypokalemic on presentation.  CT chest done in the emergency department showed bilateral multilobar pneumonia.  Started on broad-spectrum antibiotics. Respiratory status continues to improve.  Assessment & Plan:   Principal Problem:   Sepsis ([place]) Active Problems:   HCAP (healthcare-associated pneumonia)   Seropositive rheumatoid arthritis ([place])   History of tobacco use   Hyperlipidemia   History of renal cell carcinoma   History of COPD   Neutropenia ([facility])   Hypokalemia  Suspected neutropenic fever/sepsis on admission:Cultures negative so far. Flu panel negative.  Elevated   procalcitonin on presentation.  Lactic acid was elevated on presentation which has  resolved after the IV fluids were given. Changed Abx to oral .    Healthcare associated pneumonia: Patient is immunocompromised.  History of renal cell carcinoma.  On methotrexate for rheumatoid arthritis at home.  Started on cefepime and vancomycin.    CT chest showed multilobar pneumonia.CXR did not show any pneumonia.  Changed antibiotics to oral.  Will monitor him on room air today.  COPD, new diagnosis: Chest imaging suggestive of this.  Patient has bilateral expiratory wheezes.  Previous smoker but no documented history of COPD.  Continue bronchodilators and steroids.  Neutropenia: White cell counts improved.  We will continue to monitor.    Hypokalemia: Resolved  Remote history of renal cell carcinoma: On remission.  History of rheumatoid arthritis: On methotrexate at home.  Currently on hold.  Follows with  rheumatologist.  Has joint  deformities on his hands.  GERD: Continue PPI  History of pituitary adenoma/adrenal insufficiency: On bromocriptine and hydrocortisone at home.   Goitre: CT imagins showed Chronic thyroid goiter. New 15 mm left lobe thyroid nodule since 2013 is probably benign but meets consensus criteria for follow-up Thyroid Ultrasound.  Generalized weakness/debility: OT recommending Home health OT.         DVT prophylaxis: Lovenox Code Status: Full Family Communication: Family present at the bedside  disposition Plan: Home tomorrow  Consultants: None  Procedures: None  Antimicrobials:  Anti-infectives (From admission, onward)   Start     Dose/Rate Route Frequency Ordered Stop   05/19/18 1000  levofloxacin (LEVAQUIN) tablet 750 mg     750 mg Oral Daily 05/19/18 0748 05/22/18 0959   05/16/18 1600  vancomycin (VANCOCIN) 1,250 mg in sodium chloride 0.9 % 250 mL IVPB  Status:  Discontinued     1,250 mg 166.7 mL/hr over 90 Minutes Intravenous Every 24 hours 05/15/18 1501 05/19/18 0747   05/15/18 1600  metroNIDAZOLE (FLAGYL) IVPB 500 mg  Status:  Discontinued     500 mg 100 mL/hr over 60 Minutes Intravenous Every 8 hours 05/15/18 1449 05/16/18 1158   05/15/18 1600  ceFEPIme (MAXIPIME) 2 g in sodium chloride 0.9 % 100 mL IVPB  Status:  Discontinued     2 g 200 mL/hr over 30 Minutes Intravenous Every 12 hours 05/15/18 1501 05/19/18 0747   05/15/18 1500  ceFEPIme (MAXIPIME) 2 g in sodium chloride 0.9 % 100 mL IVPB  Status:  Discontinued     2 g 200 mL/hr over 30 Minutes Intravenous  Once 05/15/18 1449 05/15/18 1501  05/15/18 1500  vancomycin (VANCOCIN) 1,500 mg in sodium chloride 0.9 % 500 mL IVPB     1,500 mg 250 mL/hr over 120 Minutes Intravenous  Once 05/15/18 1449 05/15/18 1840      Subjective: Patient seen and examined the bedside this morning.  Remains comfortable.  Hemodynamically stable.  Respiratory status is improved.  He still has some cough.  Objective: Vitals:   05/18/18 2038 05/18/18 2113 05/19/18 0533 05/19/18 0759  BP: (!) 146/97  (!) 162/95   Pulse: (!) 102  (!) 102   Resp: 19  20   Temp: 98.2 F (36.8 C)  98.9 F (37.2 C)   TempSrc: Oral     SpO2: 95% 93% 90% 95%  Weight:      Height:        Intake/Output Summary (Last 24 hours) at 05/19/2018 1426 Last data filed at 05/18/2018 1740 Gross per 24 hour  Intake 240 ml  Output -  Net 240 ml   Filed Weights   05/15/18 1149 05/16/18 2000 05/18/18 0504  Weight: 70.3 kg 70.3 kg 79.4 kg    Examination:  General exam: Appears calm and comfortable ,Not in distress,average built HEENT:PERRL,Oral mucosa moist, Ear/Nose normal on gross exam Respiratory system: Bilateral decreased air entry Cardiovascular system: S1 & S2 heard, RRR. No JVD, murmurs, rubs, gallops or clicks. Gastrointestinal system: Abdomen is nondistended, soft and nontender. No organomegaly or masses felt. Normal bowel sounds heard. Central nervous system: Alert and oriented. No focal neurological deficits. Extremities: No edema, no clubbing ,no cyanosis, distal peripheral pulses palpable. Skin: No rashes, lesions or ulcers,no icterus ,no pallor MSK: Normal muscle bulk,tone ,power Psychiatry: Judgement and insight appear normal. Mood & affect appropriate.       Data Reviewed: I have personally reviewed following labs and imaging studies  CBC: Recent Labs  Lab 05/15/18 1312 05/16/18 0434 05/17/18 0607  WBC 0.8* 3.1* 9.0  NEUTROABS 0.2* 2.1 7.5  HGB 11.6* 10.5* 11.3*  HCT 36.9* 34.0* 39.0  MCV 100.0 101.5* 106.3*  PLT 170 180 588   Basic Metabolic Panel: Recent Labs  Lab 05/15/18 2239 05/16/18 0434 05/17/18 0607 05/18/18 0544 05/19/18 0527  NA 138 136 137 138 137  K 3.6 4.0 4.6 4.1 4.0  CL 102 106 105 105 106  CO2 23 21* 19* 24 21*  GLUCOSE 123* 93 128* 133* 141*  BUN 15 20 33* 38* 27*  CREATININE 0.96 1.19 1.24 1.00 0.91  CALCIUM 7.9* 7.6* 8.1* 8.8* 8.7*  MG 1.4* 2.4  --   --    --    GFR: Estimated Creatinine Clearance: 59.5 mL/min (by C-G formula based on SCr of 0.91 mg/dL). Liver Function Tests: Recent Labs  Lab 05/15/18 1312 05/17/18 0607  AST 24  --   ALT 34  --   ALKPHOS 53  --   BILITOT 1.7*  --   PROT 6.3*  --   ALBUMIN 2.9* 3.7   No results for input(s): LIPASE, AMYLASE in the last 168 hours. No results for input(s): AMMONIA in the last 168 hours. Coagulation Profile: Recent Labs  Lab 05/15/18 1802  INR 1.2   Cardiac Enzymes: Recent Labs  Lab 05/16/18 1503  CKTOTAL 65   BNP (last 3 results) No results for input(s): PROBNP in the last 8760 hours. HbA1C: No results for input(s): HGBA1C in the last 72 hours. CBG: Recent Labs  Lab 05/16/18 0742 05/17/18 0828 05/18/18 0753  GLUCAP 94 126* 149*   Lipid Profile: No results for input(s):  CHOL, HDL, LDLCALC, TRIG, CHOLHDL, LDLDIRECT in the last 72 hours. Thyroid Function Tests: No results for input(s): TSH, T4TOTAL, FREET4, T3FREE, THYROIDAB in the last 72 hours. Anemia Panel: No results for input(s): VITAMINB12, FOLATE, FERRITIN, TIBC, IRON, RETICCTPCT in the last 72 hours. Sepsis Labs: Recent Labs  Lab 05/15/18 1802 05/15/18 2217 05/16/18 0434 05/16/18 1100  PROCALCITON 1.74  --   --   --   LATICACIDVEN 2.3* 1.7 1.2 1.2    Recent Results (from the past 240 hour(s))  Respiratory Panel by PCR     Status: None   Collection Time: 05/15/18  1:12 PM  Result Value Ref Range Status   Adenovirus NOT DETECTED NOT DETECTED Final   Coronavirus 229E NOT DETECTED NOT DETECTED Final    Comment: (NOTE) The Coronavirus on the Respiratory Panel, DOES NOT test for the novel  Coronavirus (2019 nCoV)    Coronavirus HKU1 NOT DETECTED NOT DETECTED Final   Coronavirus NL63 NOT DETECTED NOT DETECTED Final   Coronavirus OC43 NOT DETECTED NOT DETECTED Final   Metapneumovirus NOT DETECTED NOT DETECTED Final   Rhinovirus / Enterovirus NOT DETECTED NOT DETECTED Final   Influenza A NOT DETECTED  NOT DETECTED Final   Influenza B NOT DETECTED NOT DETECTED Final   Parainfluenza Virus 1 NOT DETECTED NOT DETECTED Final   Parainfluenza Virus 2 NOT DETECTED NOT DETECTED Final   Parainfluenza Virus 3 NOT DETECTED NOT DETECTED Final   Parainfluenza Virus 4 NOT DETECTED NOT DETECTED Final   Respiratory Syncytial Virus NOT DETECTED NOT DETECTED Final   Bordetella pertussis NOT DETECTED NOT DETECTED Final   Chlamydophila pneumoniae NOT DETECTED NOT DETECTED Final   Mycoplasma pneumoniae NOT DETECTED NOT DETECTED Final    Comment: Performed at [facility], [place]. [address], [place], [place] [address]  Blood culture (routine x 2)     Status: None (Preliminary result)   Collection Time: 05/15/18  3:28 PM  Result Value Ref Range Status   Specimen Description   Final    BLOOD LEFT ANTECUBITAL Performed at [facility] [address] [place], [place] [address]    Special Requests   Final    BOTTLES DRAWN AEROBIC AND ANAEROBIC Blood Culture adequate volume Performed at [facility] [address] [place], [place] [address]    Culture   Final    NO GROWTH 4 DAYS Performed at [facility], [place] [address], [place], [place] [address]    Report Status PENDING  Incomplete  Blood culture (routine x 2)     Status: None (Preliminary result)   Collection Time: 05/15/18  3:28 PM  Result Value Ref Range Status   Specimen Description   Final    BLOOD RIGHT ANTECUBITAL Performed at [facility] [address], [place], [place] [address]    Special Requests   Final    BOTTLES DRAWN AEROBIC AND ANAEROBIC Blood Culture adequate volume Performed at [facility] [address], [place], [place] [address]    Culture   Final    NO GROWTH 4 DAYS Performed at [facility], [place] [address], [place], [place] [address]    Report Status PENDING  Incomplete  Urine culture     Status: Abnormal   Collection Time:  05/15/18  3:28 PM  Result Value Ref Range Status   Specimen Description   Final    URINE, RANDOM Performed at [facility] [address] [place],  [place] [address]    Special Requests   Final    NONE Performed at [facility], [place] [address] [place], [place] [address]    Culture (A)  Final    <10,000 COLONIES/mL INSIGNIFICANT GROWTH Performed at [place] [address], [place], [place] [address]    Report Status 05/16/2018 FINAL  Final         Radiology Studies: No results found.      Scheduled Meds: . benzonatate  200 mg Oral TID  . bromocriptine  2.5 mg Oral BID  . calcium-vitamin D  1 tablet Oral BID  . enoxaparin (LOVENOX) injection  40 mg Subcutaneous Q24H  . folic acid  2 mg Oral Daily  . guaiFENesin  10 mL Oral Q6H  . ipratropium-albuterol  3 mL Nebulization TID  . levofloxacin  750 mg Oral Daily  . levothyroxine  137 mcg Oral Q0600  . methylPREDNISolone (SOLU-MEDROL) injection  40 mg Intravenous BID  . pantoprazole  40 mg Oral BID  . pravastatin  80 mg Oral Daily   Continuous Infusions:    LOS: 4 days    Time spent: 25 mins.More than 50% of that time was spent in counseling and/or coordination of care.      Shelly Coss, MD [facility] Pager [phone]  If 7PM-7AM, please contact night-coverage [email or web address] Password [email or web address] 05/19/2018, 2:26 PM

## 2018-05-19 NOTE — Progress Notes (Signed)
Lab called RN. Cortisol was drawn on Saturday 3/14 but not sent out, "lab error." Lab informed RN that Lab would have to recollect and resend today.

## 2018-05-19 NOTE — Progress Notes (Signed)
Occupational Therapy Treatment Patient Details Name: FRITZ CAUTHON MRN: [id number] DOB: November 22, 1932 Today's Date: 05/19/2018    History of present illness per MD note: Patient is 85 year old male with past medical history of renal cell carcinoma in remission, pituitary tumor, COPD, hyperlipidemia, rheumatoid arthritis who presented to the emergency room with complaints of cough, subjective chills, lethargy and weakness.  He was also noted to be neutropenic and hypokalemic on presentation.  CT chest done in the emergency department showed bilateral multilobar pneumonia.  Started on broad-spectrum antibiotics.      Follow Up Recommendations  Home health OT    Equipment Recommendations  None recommended by OT(ED PATIENT AND CHILDREN ABOUT TOILET RAILS AND GRAB BAR )    Recommendations for Other Services      Precautions / Restrictions Precautions Precautions: Fall Precaution Comments: monitor sats Restrictions Weight Bearing Restrictions: No       Mobility Bed Mobility Overal bed mobility: Modified Independent             General bed mobility comments: pt in chair  Transfers Overall transfer level: Needs assistance Equipment used: Rolling walker (2 wheeled) Transfers: Sit to/from [facility] Sit to Stand: Min guard Stand pivot transfers: Min guard       General transfer comment: safety    Balance Overall balance assessment: Needs assistance Sitting-balance support: No upper extremity supported;Feet supported Sitting balance-Leahy Scale: Good     Standing balance support: Single extremity supported Standing balance-Leahy Scale: Fair                             ADL either performed or assessed with clinical judgement   ADL Overall ADL's : Needs assistance/impaired     Grooming: Wash/dry hands;Wash/dry face;Supervision/safety;Oral care;Standing                   Toilet Transfer: Minimal assistance;Comfort height  toilet;Ambulation;RW   Toileting- Clothing Manipulation and Hygiene: Minimal assistance;Sit to/from stand;Cueing for sequencing;Cueing for safety       Functional mobility during ADLs: Minimal assistance       Vision Baseline Vision/History: Wears glasses Wears Glasses: At all times Patient Visual Report: No change from baseline            Cognition Arousal/Alertness: Awake/alert Behavior During Therapy: WFL for tasks assessed/performed Overall Cognitive Status: Within Functional Limits for tasks assessed                                 General Comments: feeling better                   Pertinent Vitals/ Pain       Pain Assessment: No/denies pain         Frequency  Min 2X/week        Progress Toward Goals  OT Goals(current goals can now be found in the care plan section)  Progress towards OT goals: Progressing toward goals     Plan Discharge plan remains appropriate       AM-PAC OT "6 Clicks" Daily Activity     Outcome Measure   Help from another person eating meals?: None Help from another person taking care of personal grooming?: None Help from another person toileting, which includes using toliet, bedpan, or urinal?: A Little Help from another person bathing (including washing, rinsing, drying)?: A Little Help from another person to put  on and taking off regular upper body clothing?: None Help from another person to put on and taking off regular lower body clothing?: A Little 6 Click Score: 21    End of Session Equipment Utilized During Treatment: Oxygen  OT Visit Diagnosis: Unsteadiness on feet (R26.81);Muscle weakness (generalized) (M62.81)   Activity Tolerance Patient tolerated treatment well   Patient Left in chair;with call bell/phone within reach;with family/visitor present   Nurse Communication Mobility status(OK THERAPY)        Time: 7989-2119 OT Time Calculation (min): 28 min  Charges: OT General Charges $OT  Visit: 1 Visit OT Treatments $Self Care/Home Management : 23-37 mins  Kari Baars, [place] [phone] Office- [place], Edwena Felty D 05/19/2018, 3:49 PM

## 2018-05-19 NOTE — Care Management Important Message (Signed)
Important Message  Patient Details  Name: Ryan Coffey MRN: [id number] Date of Birth: 1932/08/19   Medicare Important Message Given:  Yes    Kerin Salen 05/19/2018, 12:27 [place] Message  Patient Details  Name: Ryan Coffey MRN: [id number] Date of Birth: 08/11/1932   Medicare Important Message Given:  Yes    Kerin Salen 05/19/2018, 12:27 PM

## 2018-05-20 LAB — CULTURE, BLOOD (ROUTINE X 2)
Culture: NO GROWTH
Culture: NO GROWTH
Special Requests: ADEQUATE
Special Requests: ADEQUATE

## 2018-05-20 LAB — BASIC METABOLIC PANEL
Anion gap: 7 (ref 5–15)
BUN: 23 mg/dL (ref 8–23)
CO2: 29 mmol/L (ref 22–32)
Calcium: 8.6 mg/dL — ABNORMAL LOW (ref 8.9–10.3)
Chloride: 104 mmol/L (ref 98–111)
Creatinine, Ser: 0.86 mg/dL (ref 0.61–1.24)
GFR calc Af Amer: >60 mL/min (ref >60)
GFR calc non Af Amer: >60 mL/min (ref >60)
Glucose, Bld: 122 mg/dL — ABNORMAL HIGH (ref 70–99)
Potassium: 4.4 mmol/L (ref 3.5–5.1)
Sodium: 140 mmol/L (ref 135–145)

## 2018-05-20 LAB — CORTISOL: Cortisol, Plasma: 3.5 ug/dL

## 2018-05-20 MED ORDER — LEVOFLOXACIN 750 MG PO TABS: 750.0000 mg | ORAL_TABLET | Freq: Every day | ORAL | 0 refills | Status: DC

## 2018-05-20 MED ORDER — GUAIFENESIN-DM 100-10 MG/5ML PO SYRP: 10.0000 mL | ORAL_SOLUTION | ORAL | 0 refills | Status: DC | PRN

## 2018-05-20 MED ORDER — ALBUTEROL SULFATE HFA 108 (90 BASE) MCG/ACT IN AERS: 2.0000 | INHALATION_SPRAY | Freq: Four times a day (QID) | RESPIRATORY_TRACT | 1 refills | Status: DC | PRN

## 2018-05-20 MED ORDER — ZOLPIDEM TARTRATE ER 6.25 MG PO TBCR: 6.2500 mg | EXTENDED_RELEASE_TABLET | Freq: Every evening | ORAL | 0 refills | Status: AC | PRN

## 2018-05-20 NOTE — Progress Notes (Signed)
PHYSICAL THERAPY  SATURATION QUALIFICATIONS: (This note is used to comply with regulatory documentation for home oxygen)  Patient Saturations on Room Air at Rest = 88%  Patient Saturations on Room Air while Ambulating 75 feet = 83%  Patient Saturations on 1 Liter at rest 91%  Patient Saturations on 2 Liters of oxygen while Ambulating 75 feet = 90%  Please briefly explain why patient needs home oxygen: pt required supplemental oxygen for at rest and with activity to achieve therapeutic levels.  Rica Koyanagi  PTA Acute  Rehabilitation Services Pager      [phone] Office      [phone]

## 2018-05-20 NOTE — Discharge Summary (Signed)
Physician Discharge Summary  Ryan Coffey RXV:[id number] DOB: [birth year] DOA: 05/15/2018  PCP: Prince Solian, MD  Admit date: 05/15/2018 Discharge date: 05/20/2018  Admitted From: Home Disposition:  Home  Discharge Condition:Stable CODE STATUS:FULL Diet recommendation: Heart Healthy  Brief/Interim Summary: Patient is 85 year old male with past medical history of renal cell carcinoma in remission, pituitary tumor,  hyperlipidemia, rheumatoid arthritis who presented to the emergency room with complaints of cough, subjective chills, lethargy and weakness.  He was also noted to be neutropenic and hypokalemic on presentation.  CT chest done in the emergency department showed bilateral multilobar pneumonia.  Started on broad-spectrum antibiotics. Respiratory status continued to improve with IV antibiotics.  His cultures have been negative so far.  Antibiotics changed to oral.  Patient might have undiagnosed COPD also.  He qualified for home oxygen. He is hemodynamically stable for discharge today to home.  Home health has been arranged.  Following problems were addressed during his hospitalization:  Suspected neutropenic fever/sepsis on admission:Cultures negative . Flu panel negative.  Elevated   procalcitonin on presentation.  Lactic acid was elevated on presentation which has  resolved after the IV fluids were given. Changed Abx to oral .    Healthcare associated pneumonia: Patient is immunocompromised.  History of renal cell carcinoma.  On methotrexate for rheumatoid arthritis at home.  Started on cefepime and vancomycin.    CT chest showed multilobar pneumonia.CXR did not show any pneumonia.  Changed antibiotics to oral.    COPD, new diagnosis: Chest imaging suggestive of this.  Patient had bilateral expiratory wheezes which improved with steroids.  Previous smoker but no documented history of COPD.    He actually qualified for home oxygen.  He will be discharged on albuterol inhaler.  He  needs to follow-up with pulmonology as an outpatient for pulmonary function test.  Neutropenia: White cell counts improved.     Hypokalemia: Resolved  Remote history of renal cell carcinoma: On remission.  History of rheumatoid arthritis: On methotrexate at home. Follows with rheumatologist.  Has joint  deformities on his hands.  GERD: Continue PPI  History of pituitary adenoma/adrenal insufficiency: On bromocriptine and hydrocortisone at home.   Goitre: CT imagings showed Chronic thyroid goiter. New 15 mm left lobe thyroid nodule since 2013 is probably benign but meets consensus criteria for follow-up Thyroid Ultrasound.  Generalized weakness/debility: OT/PT recommending Home health .   Discharge Diagnoses:  Principal Problem:   Sepsis ([facility]) Active Problems:   HCAP (healthcare-associated pneumonia)   Seropositive rheumatoid arthritis ([facility])   History of tobacco use   Hyperlipidemia   History of renal cell carcinoma   History of COPD   Neutropenia ([facility])   Hypokalemia    Discharge Instructions  Discharge Instructions    Diet - low sodium heart healthy   Complete by:  As directed    Discharge instructions   Complete by:  As directed    1)Please follow up with your PCP in a week.  Do a CBC and BMP test during the follow-up. 2)Take prescribed medications as instructed. 3)Follow up with pulmonology through the referral of your primary care physician. 4)Do an ultrasound of the thyroid in 3 months as an outpatient for follow up of a left  thyroid nodule that was incidentally seen on CT scan of the chest.   Increase activity slowly   Complete by:  As directed      Allergies as of 05/20/2018   No Known Allergies     Medication List  STOP taking these medications   esomeprazole 40 MG capsule Commonly known as:  NEXIUM     TAKE these medications   acetaminophen 325 MG tablet Commonly known as:  TYLENOL Take 650 mg by mouth 2 (two) times daily as needed  (arthritis).   albuterol 108 (90 Base) MCG/ACT inhaler Commonly known as:  PROVENTIL HFA;VENTOLIN HFA Inhale 2 puffs into the lungs every 6 (six) hours as needed for wheezing or shortness of breath.   bromocriptine 2.5 MG tablet Commonly known as:  PARLODEL Take 2.5 mg by mouth 2 (two) times daily.   Calcium 500 + D 500-125 MG-UNIT Tabs Generic drug:  Calcium Carbonate-Vitamin D Take 1 tablet by mouth 2 (two) times daily.   CENTRUM SILVER PO Take 1 tablet by mouth daily.   folic acid 1 MG tablet Commonly known as:  FOLVITE TAKE 2 TABLETS EVERY DAY   guaiFENesin-dextromethorphan 100-10 MG/5ML syrup Commonly known as:  ROBITUSSIN DM Take 10 mLs by mouth every 4 (four) hours as needed for cough.   hydrocortisone 20 MG tablet Commonly known as:  CORTEF Take 10-20 mg by mouth daily. 20 mg in the morning and 10mg  at night   levofloxacin 750 MG tablet Commonly known as:  LEVAQUIN Take 1 tablet (750 mg total) by mouth daily. Start taking on:  May 21, 2018   levothyroxine 137 MCG tablet Commonly known as:  SYNTHROID, LEVOTHROID Take 137 mcg by mouth daily.   methotrexate 2.5 MG tablet Commonly known as:  RHEUMATREX TAKE 4 tablets on Saturday and 4 tablets on Sunday. Caution:Chemotherapy. Protect from light.   pantoprazole 40 MG tablet Commonly known as:  PROTONIX Take 40 mg by mouth 2 (two) times daily.   pravastatin 80 MG tablet Commonly known as:  PRAVACHOL Take 80 mg by mouth daily.   testosterone cypionate 200 MG/ML injection Commonly known as:  DEPOTESTOSTERONE CYPIONATE Inject 75 mg into the muscle every 14 (fourteen) days.   zolpidem 6.25 MG CR tablet Commonly known as:  Ambien CR Take 1 tablet (6.25 mg total) by mouth at bedtime as needed for sleep.            Durable Medical Equipment  (From admission, onward)         Start     Ordered   05/20/18 1041  For home use only DME oxygen  Once    Question Answer Comment  Mode or (Route) Nasal cannula    Liters per Minute 2   Frequency Continuous (stationary and portable oxygen unit needed)   Oxygen delivery system Gas      03 /17/20 1040         Follow-up Information    Avva, Ravisankar, MD. Schedule an appointment as soon as possible for a visit in 1 week(s).   Specialty:  Internal Medicine Contact information: [address] [place] [address] [phone]          No Known Allergies  Consultations:  None   Procedures/Studies: Dg Chest 2 View  Result Date: 05/15/2018 CLINICAL DATA:  Cough and shortness of breath EXAM: CHEST - 2 VIEW COMPARISON:  February 09, 2013 chest radiograph and chest CT February 13, 2012 FINDINGS: There is no appreciable edema or consolidation. Emphysematous change in the upper lobes is noted, better appreciated on prior CT. Heart size and pulmonary vascularity within normal limits. No adenopathy. There is aortic atherosclerosis. No bone lesions. IMPRESSION: Aortic Atherosclerosis (ICD10-I70.0) and Emphysema (ICD10-J43.9). No edema or consolidation. Heart size within normal limits. No evident adenopathy.  Electronically Signed   By: Lowella Grip III M.D.   On: 05/15/2018 13:26   Ct Angio Chest Pe W Or Wo Contrast  Result Date: 05/15/2018 CLINICAL DATA:  85 year old male with chest pain. Renal cell carcinoma in remission. Cough lethargy and weakness. EXAM: CT ANGIOGRAPHY CHEST WITH CONTRAST TECHNIQUE: Multidetector CT imaging of the chest was performed using the standard protocol during bolus administration of intravenous contrast. Multiplanar CT image reconstructions and MIPs were obtained to evaluate the vascular anatomy. CONTRAST:  124mL OMNIPAQUE IOHEXOL 350 MG/ML SOLN COMPARISON:  Chest radiographs earlier today. CT Chest, Abdomen, and Pelvis 02/13/2012. FINDINGS: Cardiovascular: Good contrast bolus timing in the pulmonary arterial tree. Respiratory motion in the lower lungs. No focal filling defect identified in the pulmonary arteries to  suggest acute pulmonary embolism. Calcified coronary artery atherosclerosis. Intermittent Calcified aortic atherosclerosis. No cardiomegaly or pericardial effusion. Mediastinum/Nodes: No mediastinal lymphadenopathy. Chronic thyroid nodularity. Chronic isthmus nodule tracking inferiorly appears stable since 2013. There is a new 15 millimeter left lobe nodule on series 4, image 13. Lungs/Pleura: Centrilobular emphysema. Major airways are patent. Confluent peribronchial opacity in the bilateral lower lobes on series 6, image 77. Similar but less pronounced peribronchial opacity in the inferior right middle lobe and lateral basal segment of the right lower lobe (image 115). Chronic architectural distortion along major fissure the right and in the medial right upper lobe is stable. No pleural effusion or other acute pulmonary opacity. Upper Abdomen: Negative visible liver, spleen, pancreas, and bowel in the upper abdomen. There are new small low-density areas in both upper kidneys, but these have simple fluid density and are most likely benign cysts (series 4, images 101 and 103). Each is about 2 centimeters. Musculoskeletal: Osteopenia no acute or suspicious osseous lesion identified. Review of the MIP images confirms the above findings. IMPRESSION: 1. No evidence of acute pulmonary embolus. 2. Emphysema (ICD10-J43.9) with bilateral lower lobe and right middle lobe peribronchial opacity compatible with multifocal acute infectious exacerbation. No pleural effusion. 3. Partially visible new small cystic areas in both upper kidneys. Visualized portions have simple fluid density and are most likely benign cysts. Renal mass protocol abdomen CT or MRI with characterize fully. 4. Chronic thyroid goiter. New 15 mm left lobe thyroid nodule since 2013 is probably benign but meets consensus criteria for follow-up Thyroid Ultrasound. 5. Calcified aortic and coronary artery atherosclerosis. Electronically Signed   By: Genevie Ann M.D.    On: 05/15/2018 16:29   Dg Chest Port 1 View  Result Date: 05/16/2018 CLINICAL DATA:  Shortness of breath. History of pneumonia. Previous smoker. EXAM: PORTABLE CHEST 1 VIEW COMPARISON:  05/15/2018 FINDINGS: Heart size and pulmonary vascularity are normal. Emphysematous changes in the lungs. Fibrosis in the lung bases. No consolidation or airspace disease. No blunting of costophrenic angles. No pneumothorax. Calcification and torsion of the aorta. Degenerative changes in the spine and shoulders. IMPRESSION: Emphysematous changes and fibrosis in the lungs. No evidence of active pulmonary disease. Electronically Signed   By: Lucienne Capers M.D.   On: 05/16/2018 23:30      Subjective: Patient seen and examined the bedside this morning.  Remains comfortable.  Hemodynamically stable.  Still bothered by some cough.  Medically stable for discharge today.  Discharge Exam: Vitals:   05/20/18 0838 05/20/18 0841  BP:    Pulse:    Resp:    Temp:    SpO2: 92% 92%   Vitals:   05/19/18 2032 05/20/18 0545 05/20/18 0838 05/20/18 0841  BP:  Marland Kitchen)  169/92    Pulse:  (!) 101    Resp:  20    Temp:  98.1 F (36.7 C)    TempSrc:      SpO2: 91% 91% 92% 92%  Weight:      Height:        General: Pt is alert, awake, not in acute distress Cardiovascular: RRR, S1/S2 +, no rubs, no gallops Respiratory: Bilateral decreased air entry Abdominal: Soft, NT, ND, bowel sounds + Extremities: no edema, no cyanosis    The results of significant diagnostics from this hospitalization (including imaging, microbiology, ancillary and laboratory) are listed below for reference.     Microbiology: Recent Results (from the past 240 hour(s))  Respiratory Panel by PCR     Status: None   Collection Time: 05/15/18  1:12 PM  Result Value Ref Range Status   Adenovirus NOT DETECTED NOT DETECTED Final   Coronavirus 229E NOT DETECTED NOT DETECTED Final    Comment: (NOTE) The Coronavirus on the Respiratory Panel, DOES NOT  test for the novel  Coronavirus (2019 nCoV)    Coronavirus HKU1 NOT DETECTED NOT DETECTED Final   Coronavirus NL63 NOT DETECTED NOT DETECTED Final   Coronavirus OC43 NOT DETECTED NOT DETECTED Final   Metapneumovirus NOT DETECTED NOT DETECTED Final   Rhinovirus / Enterovirus NOT DETECTED NOT DETECTED Final   Influenza A NOT DETECTED NOT DETECTED Final   Influenza B NOT DETECTED NOT DETECTED Final   Parainfluenza Virus 1 NOT DETECTED NOT DETECTED Final   Parainfluenza Virus 2 NOT DETECTED NOT DETECTED Final   Parainfluenza Virus 3 NOT DETECTED NOT DETECTED Final   Parainfluenza Virus 4 NOT DETECTED NOT DETECTED Final   Respiratory Syncytial Virus NOT DETECTED NOT DETECTED Final   Bordetella pertussis NOT DETECTED NOT DETECTED Final   Chlamydophila pneumoniae NOT DETECTED NOT DETECTED Final   Mycoplasma pneumoniae NOT DETECTED NOT DETECTED Final    Comment: Performed at [facility], [address], [place], [place] [address]  Blood culture (routine x 2)     Status: None   Collection Time: 05/15/18  3:28 PM  Result Value Ref Range Status   Specimen Description   Final    BLOOD LEFT ANTECUBITAL Performed at [facility] [address], [place], [place] [address]    Special Requests   Final    BOTTLES DRAWN AEROBIC AND ANAEROBIC Blood Culture adequate volume Performed at [facility] [address], [place], [place] [address]    Culture   Final    NO GROWTH 5 DAYS Performed at [facility], [place] [address] [place], [place] [address]    Report Status 05/20/2018 FINAL  Final  Blood culture (routine x 2)     Status: None   Collection Time: 05/15/18  3:28 PM  Result Value Ref Range Status   Specimen Description   Final    BLOOD RIGHT ANTECUBITAL Performed at [facility] [address] [place], [place] [address]    Special Requests   Final    BOTTLES DRAWN AEROBIC AND ANAEROBIC Blood Culture adequate  volume Performed at [facility] [address] [place] [address], [place], [place] [address]    Culture   Final    NO GROWTH 5 DAYS Performed at [facility], [place] [address], [place], [place] [address]    Report Status 05/20/2018 FINAL  Final  Urine culture     Status: Abnormal   Collection Time: 05/15/18  3:28 PM  Result Value Ref Range Status   Specimen Description   Final    URINE, RANDOM Performed at [facility], [place] [address] [place], [place] [address]    Special Requests   Final    NONE Performed at [facility], [place] [address] [place], [place] [address]    Culture (A)  Final    <10,000 COLONIES/mL INSIGNIFICANT GROWTH Performed at [facility] [address] [place], [place] [address]    Report Status 05/16/2018 FINAL  Final     Labs: BNP (last 3 results) No results for input(s): BNP in the last 8760 hours. Basic Metabolic Panel: Recent Labs  Lab 05/15/18 2239 05/16/18 0434 05/17/18 0607 05/18/18 0544 05/19/18 0527 05/20/18 0558  NA 138 136 137 138 137 140  K 3.6 4.0 4.6 4.1 4.0 4.4  CL 102 106 105 105 106 104  CO2 23 21* 19* 24 21* 29  GLUCOSE 123* 93 128* 133* 141* 122*  BUN 15 20 33* 38* 27* 23  CREATININE 0.96 1.19 1.24 1.00 0.91 0.86  CALCIUM 7.9* 7.6* 8.1* 8.8* 8.7* 8.6*  MG 1.4* 2.4  --   --   --   --    Liver Function Tests: Recent Labs  Lab 05/15/18 1312 05/17/18 0607  AST 24  --   ALT 34  --   ALKPHOS 53  --   BILITOT 1.7*  --   PROT 6.3*  --   ALBUMIN 2.9* 3.7   No results for input(s): LIPASE, AMYLASE in the last 168 hours. No results for input(s): AMMONIA in the last 168 hours. CBC: Recent Labs  Lab 05/15/18 1312 05/16/18 0434 05/17/18 0607  WBC 0.8* 3.1* 9.0  NEUTROABS 0.2* 2.1 7.5  HGB 11.6* 10.5* 11.3*  HCT 36.9* 34.0* 39.0  MCV 100.0 101.5* 106.3*  PLT 170 180 358   Cardiac Enzymes: Recent Labs  Lab 05/16/18 1503  CKTOTAL 65   BNP: Invalid input(s):  POCBNP CBG: Recent Labs  Lab 05/16/18 0742 05/17/18 0828 05/18/18 0753  GLUCAP 94 126* 149*   D-Dimer No results for input(s): DDIMER in the last 72 hours. Hgb A1c No results for input(s): HGBA1C in the last 72 hours. Lipid Profile No results for input(s): CHOL, HDL, LDLCALC, TRIG, CHOLHDL, LDLDIRECT in the last 72 hours. Thyroid function studies No results for input(s): TSH, T4TOTAL, T3FREE, THYROIDAB in the last 72 hours.  Invalid input(s): FREET3 Anemia work up No results for input(s): VITAMINB12, FOLATE, FERRITIN, TIBC, IRON, RETICCTPCT in the last 72 hours. Urinalysis    Component Value Date/Time   COLORURINE AMBER (A) 05/15/2018 1528   APPEARANCEUR HAZY (A) 05/15/2018 1528   LABSPEC 1.020 05/15/2018 1528   PHURINE 5.0 05/15/2018 1528   GLUCOSEU NEGATIVE 05/15/2018 1528   HGBUR NEGATIVE 05/15/2018 1528   BILIRUBINUR NEGATIVE 05/15/2018 1528   KETONESUR 5 (A) 05/15/2018 1528   PROTEINUR 100 (A) 05/15/2018 1528   UROBILINOGEN 1.0 05/23/2011 0953   NITRITE NEGATIVE 05/15/2018 1528   LEUKOCYTESUR NEGATIVE 05/15/2018 1528   Sepsis Labs Invalid input(s): PROCALCITONIN,  WBC,  LACTICIDVEN Microbiology Recent Results (from the past 240 hour(s))  Respiratory Panel by PCR     Status: None   Collection Time: 05/15/18  1:12 PM  Result Value Ref Range Status   Adenovirus NOT DETECTED NOT DETECTED Final   Coronavirus 229E NOT DETECTED NOT DETECTED Final    Comment: (NOTE) The Coronavirus on the Respiratory Panel, DOES NOT test for the novel  Coronavirus (2019 nCoV)    Coronavirus HKU1 NOT DETECTED NOT DETECTED Final   Coronavirus NL63 NOT DETECTED NOT DETECTED Final   Coronavirus OC43 NOT DETECTED NOT DETECTED Final   Metapneumovirus NOT DETECTED NOT DETECTED Final   Rhinovirus / Enterovirus NOT DETECTED NOT DETECTED Final   Influenza A NOT DETECTED NOT DETECTED Final   Influenza B NOT DETECTED NOT DETECTED Final   Parainfluenza Virus 1 NOT DETECTED NOT DETECTED Final    Parainfluenza Virus 2 NOT DETECTED NOT DETECTED Final   Parainfluenza Virus 3 NOT DETECTED NOT DETECTED Final   Parainfluenza Virus 4 NOT DETECTED NOT DETECTED Final   Respiratory Syncytial Virus NOT DETECTED NOT DETECTED Final   Bordetella pertussis NOT DETECTED NOT DETECTED Final   Chlamydophila pneumoniae NOT DETECTED NOT DETECTED Final   Mycoplasma pneumoniae NOT DETECTED NOT DETECTED Final    Comment: Performed at [facility], [place] [address], [place], [place] [address]  Blood culture (routine x 2)     Status: None   Collection Time: 05/15/18  3:28 PM  Result Value Ref Range Status   Specimen Description   Final    BLOOD LEFT ANTECUBITAL Performed at [facility] [address], [place], [place] [address]    Special Requests   Final    BOTTLES DRAWN AEROBIC AND ANAEROBIC Blood Culture adequate volume Performed at [facility] [address] [place], [place] [address]    Culture   Final    NO GROWTH 5 DAYS Performed at [facility], [place] [address] [place], [place] [address]    Report Status 05/20/2018 FINAL  Final  Blood culture (routine x 2)     Status: None   Collection Time: 05/15/18  3:28 PM  Result Value Ref Range Status   Specimen Description   Final    BLOOD RIGHT ANTECUBITAL Performed at [facility] [address] [place], [place] [address]    Special Requests   Final    BOTTLES DRAWN AEROBIC AND ANAEROBIC Blood Culture adequate volume Performed at [facility] [address] [place], [place] [address]    Culture   Final    NO GROWTH 5 DAYS Performed at [facility], [place] [address], [place], [place] [address]    Report Status 05/20/2018 FINAL  Final  Urine culture     Status: Abnormal   Collection Time: 05/15/18  3:28 PM  Result Value Ref Range Status   Specimen Description   Final    URINE, RANDOM Performed at [facility]  [address], [place], [place] [address]    Special Requests   Final    NONE Performed at [facility], [place] [address], [place], [place] [address]    Culture (A)  Final    <10,000 COLONIES/mL INSIGNIFICANT GROWTH Performed at [facility] [address], [place], [place] [address]    Report Status 05/16/2018 FINAL  Final    Please note: You were cared for by a hospitalist during your hospital stay. Once you are discharged, your primary care physician will handle any further medical issues. Please note that NO REFILLS for any discharge medications will be authorized once you are discharged, as it is imperative that you return to your primary care physician (or establish a relationship with a primary care physician if you do not have one) for your post hospital discharge needs so that they can reassess your need  for medications and monitor your lab values.    Time coordinating discharge: 40 minutes  SIGNED:   Shelly Coss, MD  [facility] 05/20/2018, 10:51 AM Pager [phone]  If 7PM-7AM, please contact night-coverage [email or web address] Password [email or web address]

## 2018-05-20 NOTE — Progress Notes (Signed)
Physical Therapy Treatment Patient Details Name: Ryan Coffey MRN: [id number] DOB: 03-15-1932 Today's Date: 05/20/2018    History of Present Illness per MD note: Patient is 85 year old male with past medical history of renal cell carcinoma in remission, pituitary tumor, COPD, hyperlipidemia, rheumatoid arthritis who presented to the emergency room with complaints of cough, subjective chills, lethargy and weakness.  He was also noted to be neutropenic and hypokalemic on presentation.  CT chest done in the emergency department showed bilateral multilobar pneumonia.  Started on broad-spectrum antibiotics.    PT Comments    Pt feeling better.  Assisted with amb a greater distance with walker.    SATURATION QUALIFICATIONS: (This note is used to comply with regulatory documentation for home oxygen)  Patient Saturations on Room Air at Rest = 88%  Patient Saturations on Room Air while Ambulating 75 feet = 83%  Patient Saturations on 1 Liter at rest 91%  Patient Saturations on 2 Liters of oxygen while Ambulating 75 feet = 90%  Please briefly explain why patient needs home oxygen: pt required supplemental oxygen for at rest and with activity to achieve therapeutic levels.  Follow Up Recommendations  Home health PT     Equipment Recommendations  Other (comment)(oxygen)    Recommendations for Other Services       Precautions / Restrictions Precautions Precautions: Fall Precaution Comments: monitor sats Restrictions Weight Bearing Restrictions: No    Mobility  Bed Mobility               General bed mobility comments: pt in chair  Transfers Overall transfer level: Needs assistance Equipment used: Rolling walker (2 wheeled) Transfers: Sit to/from Stand;Stand Pivot Transfers Sit to Stand: Min guard Stand pivot transfers: Min guard       General transfer comment: safety with turns   Ambulation/Gait Ambulation/Gait assistance: Supervision;Min guard Gait Distance  (Feet): 75 Feet Assistive device: Rolling walker (2 wheeled) Gait Pattern/deviations: Step-through pattern Gait velocity: decreased    General Gait Details: pt required 1 lt for at rest and 2 lts for amb   Stairs             Wheelchair Mobility    Modified Rankin (Stroke Patients Only)       Balance                                            Cognition Arousal/Alertness: Awake/alert Behavior During Therapy: WFL for tasks assessed/performed Overall Cognitive Status: Within Functional Limits for tasks assessed                                 General Comments: feeling better      Exercises      General Comments        Pertinent Vitals/Pain      Home Living                      Prior Function            PT Goals (current goals can now be found in the care plan section) Progress towards PT goals: Progressing toward goals    Frequency    Min 3X/week      PT Plan Current plan remains appropriate    Co-evaluation  AM-PAC PT "6 Clicks" Mobility   Outcome Measure  Help needed turning from your back to your side while in a flat bed without using bedrails?: A Little Help needed moving from lying on your back to sitting on the side of a flat bed without using bedrails?: A Little Help needed moving to and from a bed to a chair (including a wheelchair)?: A Little Help needed standing up from a chair using your arms (e.g., wheelchair or bedside chair)?: A Little Help needed to walk in hospital room?: A Little Help needed climbing 3-5 steps with a railing? : A Little 6 Click Score: 18    End of Session Equipment Utilized During Treatment: Gait belt Activity Tolerance: Patient tolerated treatment well Patient left: in chair;with call bell/phone within reach;with family/visitor present Nurse Communication: Mobility status PT Visit Diagnosis: Muscle weakness (generalized) (M62.81)     Time:  5993-5701 PT Time Calculation (min) (ACUTE ONLY): 18 min  Charges:  $Gait Training: 8-22 mins                     Rica Koyanagi  PTA Acute  Rehabilitation Services Pager      [phone] Office      [phone]

## 2018-05-27 DIAGNOSIS — E039 Hypothyroidism, unspecified: Secondary | ICD-10-CM | POA: Diagnosis not present

## 2018-05-27 DIAGNOSIS — M069 Rheumatoid arthritis, unspecified: Secondary | ICD-10-CM | POA: Diagnosis not present

## 2018-05-27 DIAGNOSIS — E230 Hypopituitarism: Secondary | ICD-10-CM | POA: Diagnosis not present

## 2018-05-27 DIAGNOSIS — J449 Chronic obstructive pulmonary disease, unspecified: Secondary | ICD-10-CM | POA: Diagnosis not present

## 2018-05-27 DIAGNOSIS — E041 Nontoxic single thyroid nodule: Secondary | ICD-10-CM | POA: Diagnosis not present

## 2018-05-27 DIAGNOSIS — M810 Age-related osteoporosis without current pathological fracture: Secondary | ICD-10-CM | POA: Diagnosis not present

## 2018-05-27 DIAGNOSIS — D709 Neutropenia, unspecified: Secondary | ICD-10-CM | POA: Diagnosis not present

## 2018-05-27 DIAGNOSIS — J189 Pneumonia, unspecified organism: Secondary | ICD-10-CM | POA: Diagnosis not present

## 2018-05-27 DIAGNOSIS — K279 Peptic ulcer, site unspecified, unspecified as acute or chronic, without hemorrhage or perforation: Secondary | ICD-10-CM | POA: Diagnosis not present

## 2018-05-28 DIAGNOSIS — E291 Testicular hypofunction: Secondary | ICD-10-CM | POA: Diagnosis not present

## 2018-05-28 DIAGNOSIS — Z79899 Other long term (current) drug therapy: Secondary | ICD-10-CM | POA: Diagnosis not present

## 2018-06-01 ENCOUNTER — Other Ambulatory Visit: Payer: Self-pay | Admitting: Rheumatology

## 2018-06-02 NOTE — Telephone Encounter (Signed)
ok 

## 2018-06-02 NOTE — Telephone Encounter (Signed)
Last visit: 02/05/18 Next Visit: 07/09/18 Labs: 05/19/18 Calcium 8.7, BUN 27, Glucose 141, CO2 21, RBC 3.67, Hgb 11.3, MCV 106.3, MCHC 29.0, RDW 15.9  Okay to refill MTX?

## 2018-06-09 DIAGNOSIS — Z20818 Contact with and (suspected) exposure to other bacterial communicable diseases: Secondary | ICD-10-CM | POA: Diagnosis not present

## 2018-06-09 DIAGNOSIS — J449 Chronic obstructive pulmonary disease, unspecified: Secondary | ICD-10-CM | POA: Diagnosis not present

## 2018-06-09 DIAGNOSIS — J189 Pneumonia, unspecified organism: Secondary | ICD-10-CM | POA: Diagnosis not present

## 2018-06-09 DIAGNOSIS — Z79899 Other long term (current) drug therapy: Secondary | ICD-10-CM | POA: Diagnosis not present

## 2018-06-09 DIAGNOSIS — R05 Cough: Secondary | ICD-10-CM | POA: Diagnosis not present

## 2018-06-09 DIAGNOSIS — E230 Hypopituitarism: Secondary | ICD-10-CM | POA: Diagnosis not present

## 2018-06-12 DIAGNOSIS — Z85528 Personal history of other malignant neoplasm of kidney: Secondary | ICD-10-CM | POA: Diagnosis not present

## 2018-06-12 DIAGNOSIS — Z9181 History of falling: Secondary | ICD-10-CM | POA: Diagnosis not present

## 2018-06-12 DIAGNOSIS — M1990 Unspecified osteoarthritis, unspecified site: Secondary | ICD-10-CM | POA: Diagnosis not present

## 2018-06-12 DIAGNOSIS — Z7952 Long term (current) use of systemic steroids: Secondary | ICD-10-CM | POA: Diagnosis not present

## 2018-06-12 DIAGNOSIS — E039 Hypothyroidism, unspecified: Secondary | ICD-10-CM | POA: Diagnosis not present

## 2018-06-12 DIAGNOSIS — Z8701 Personal history of pneumonia (recurrent): Secondary | ICD-10-CM | POA: Diagnosis not present

## 2018-06-12 DIAGNOSIS — Z87891 Personal history of nicotine dependence: Secondary | ICD-10-CM | POA: Diagnosis not present

## 2018-06-12 DIAGNOSIS — E785 Hyperlipidemia, unspecified: Secondary | ICD-10-CM | POA: Diagnosis not present

## 2018-06-12 DIAGNOSIS — J449 Chronic obstructive pulmonary disease, unspecified: Secondary | ICD-10-CM | POA: Diagnosis not present

## 2018-06-12 DIAGNOSIS — E038 Other specified hypothyroidism: Secondary | ICD-10-CM | POA: Diagnosis not present

## 2018-06-12 DIAGNOSIS — E7849 Other hyperlipidemia: Secondary | ICD-10-CM | POA: Diagnosis not present

## 2018-06-12 DIAGNOSIS — K219 Gastro-esophageal reflux disease without esophagitis: Secondary | ICD-10-CM | POA: Diagnosis not present

## 2018-06-12 DIAGNOSIS — M059 Rheumatoid arthritis with rheumatoid factor, unspecified: Secondary | ICD-10-CM | POA: Diagnosis not present

## 2018-06-12 DIAGNOSIS — Z8546 Personal history of malignant neoplasm of prostate: Secondary | ICD-10-CM | POA: Diagnosis not present

## 2018-06-18 ENCOUNTER — Ambulatory Visit (INDEPENDENT_AMBULATORY_CARE_PROVIDER_SITE_OTHER): Payer: Medicare Other | Admitting: Internal Medicine

## 2018-06-18 ENCOUNTER — Other Ambulatory Visit: Payer: Self-pay

## 2018-06-18 DIAGNOSIS — K219 Gastro-esophageal reflux disease without esophagitis: Secondary | ICD-10-CM | POA: Diagnosis not present

## 2018-06-18 DIAGNOSIS — E785 Hyperlipidemia, unspecified: Secondary | ICD-10-CM | POA: Diagnosis not present

## 2018-06-18 DIAGNOSIS — R634 Abnormal weight loss: Secondary | ICD-10-CM | POA: Insufficient documentation

## 2018-06-18 DIAGNOSIS — R05 Cough: Secondary | ICD-10-CM | POA: Insufficient documentation

## 2018-06-18 DIAGNOSIS — E039 Hypothyroidism, unspecified: Secondary | ICD-10-CM | POA: Diagnosis not present

## 2018-06-18 DIAGNOSIS — R053 Chronic cough: Secondary | ICD-10-CM

## 2018-06-18 DIAGNOSIS — M1990 Unspecified osteoarthritis, unspecified site: Secondary | ICD-10-CM | POA: Diagnosis not present

## 2018-06-18 DIAGNOSIS — M059 Rheumatoid arthritis with rheumatoid factor, unspecified: Secondary | ICD-10-CM | POA: Diagnosis not present

## 2018-06-18 DIAGNOSIS — J449 Chronic obstructive pulmonary disease, unspecified: Secondary | ICD-10-CM | POA: Diagnosis not present

## 2018-06-18 NOTE — Progress Notes (Signed)
Virtual Visit via Telephone Note  I connected with Janann Colonel on 06/23/18 at  3:00 PM EDT by telephone and verified that I am speaking with the correct person using two identifiers.   I discussed the limitations, risks, security and privacy concerns of performing an evaluation and management service by telephone and the availability of in person appointments. I also discussed with the patient that there may be a patient responsible charge related to this service. The patient expressed understanding and agreed to proceed.  I discussed the assessment and treatment plan with the patient. The patient was provided an opportunity to ask questions and all were answered. The patient agreed with the plan and demonstrated an understanding of the instructions.   The patient was advised to call back or seek an in-person evaluation if the symptoms worsen or if the condition fails to improve as anticipated.  I provided 40 minutes of non-face-to-face time during this encounter.   Michel Bickers, MD       [facility]  Reason for Consult: Chronic cough and recent hospitalization for pneumonia Referring Provider: Dr. Berneta Sages  Assessment: The cause of his chronic cough remains unclear.  I suspect that his more sudden illness in early March was due to superimposed acute pneumonia.  Although his COVID-19 test was negative I still strongly suspect that he had superinfection with the novel coronavirus.  His clinical presentation and radiographic changes are certainly compatible and he was admitted right as the epidemic was taking off in our community.  We are learning that the nasal swab PCR test appears to lose sensitivity as the disease progresses so his result may represent a false negative test.  He is feeling better so I would not recommend any further evaluation at this point.  He is scheduled for a repeat chest x-ray on 07/01/2018.  I will follow-up with him by phone at  that time.  If he continues to have the productive cough I will need to schedule a face-to-face visit and obtain sputum for routine and AFB cultures.  Plan: 1. Phone follow-up after repeat chest x-ray on 07/01/2018  Patient Active Problem List   Diagnosis Date Noted  . Unintentional weight loss 06/18/2018    Priority: High  . Chronic cough 06/18/2018    Priority: High  . HCAP (healthcare-associated pneumonia) 06/27/2011    Priority: High    Class: Acute  . COPD (chronic obstructive pulmonary disease) ([place]) 08/29/2016    Priority: Medium  . Rheumatoid nodulosis ([place]) 08/24/2016  . High risk medication use 08/24/2016  . Thrombocytosis ([place]) 08/24/2016  . Elevated LFTs mild elevation of ALT  08/24/2016  . History of renal cell carcinoma 08/24/2016  . History of prostate cancer 08/24/2016  . History of lung cancer 08/24/2016  . History of adrenal insufficiency 08/24/2016  . Seropositive rheumatoid arthritis ([place]) 06/27/2011  . Panhypopituitarism ([place]) 06/27/2011  . Degenerative joint disease 06/27/2011  . History of tobacco use 06/27/2011  . Hyperlipidemia 06/27/2011    Patient's Medications  New Prescriptions   No medications on file  Previous Medications   ACETAMINOPHEN (TYLENOL) 325 MG TABLET    Take 650 mg by mouth 2 (two) times daily as needed (arthritis).   ALBUTEROL (PROVENTIL HFA;VENTOLIN HFA) 108 (90 BASE) MCG/ACT INHALER    Inhale 2 puffs into the lungs every 6 (six) hours as needed for wheezing or shortness of breath.   BROMOCRIPTINE (PARLODEL) 2.5 MG TABLET    Take 2.5 mg by  mouth 2 (two) times daily.   CALCIUM CARBONATE-VITAMIN D (CALCIUM 500 + D) 500-125 MG-UNIT TABS    Take 1 tablet by mouth 2 (two) times daily.   FOLIC ACID (FOLVITE) 1 MG TABLET    TAKE 2 TABLETS EVERY DAY   GUAIFENESIN-DEXTROMETHORPHAN (ROBITUSSIN DM) 100-10 MG/5ML SYRUP    Take 10 mLs by mouth every 4 (four) hours as needed for cough.   HYDROCORTISONE (CORTEF) 20 MG TABLET    Take 10-20 mg by  mouth daily. 20 mg in the morning and 10mg  at night   LEVOFLOXACIN (LEVAQUIN) 750 MG TABLET    Take 1 tablet (750 mg total) by mouth daily.   LEVOTHYROXINE (SYNTHROID, LEVOTHROID) 137 MCG TABLET    Take 137 mcg by mouth daily.     METHOTREXATE (RHEUMATREX) 2.5 MG TABLET    TAKE 4 TABLETS ON SATURDAY AND 4 TABLETS ON SUNDAY. CAUTION:CHEMOTHERAPY. PROTECT FROM LIGHT.   MULTIPLE VITAMINS-MINERALS (CENTRUM SILVER PO)    Take 1 tablet by mouth daily.    PANTOPRAZOLE (PROTONIX) 40 MG TABLET    Take 40 mg by mouth 2 (two) times daily.   PRAVASTATIN (PRAVACHOL) 80 MG TABLET    Take 80 mg by mouth daily.   TESTOSTERONE CYPIONATE (DEPOTESTOSTERONE CYPIONATE) 200 MG/ML INJECTION    Inject 75 mg into the muscle every 14 (fourteen) days.    ZOLPIDEM (AMBIEN CR) 6.25 MG CR TABLET    Take 1 tablet (6.25 mg total) by mouth at bedtime as needed for sleep.  Modified Medications   No medications on file  Discontinued Medications   No medications on file    HPI: I spoke with Ryan Coffey by phone on 06/18/2018.  His daughter, Vevelyn Francois, participated in the cold.  Mr. Barnette is an 85 year old gentleman who has not felt completely well for the last 4 to 5 months.  He says that he developed a "wet cough" he has had some occasional sweats but no documented fever.  He has had some unintentional weight loss leading to fatigue.  He tells me that he has had several rounds of antibiotics without improvement.  They are not sure what the antibiotics were.  In early March he developed sudden onset of diarrhea, nausea and vomiting and had a much more rapid decline.  His daughter became concerned and took him to the hospital where he was admitted from March 12-17.  He was afebrile but was noted to be hypoxic.  Chest x-ray and CT scan showed evidence of COPD with bilateral lower lobe and right middle lobe peribronchial opacity.  Influenza and respiratory virus panel were both negative.  He was treated with IV vancomycin and cefepime in  the hospital and discharged home on a short course of levofloxacin.  He was also sent home on supplemental nasal prong oxygen which he had not needed previously.  He was seen back in his PCPs office on 06/09/2018. Repeat chest x-ray showed persistent right lower lobe infiltrate.  He was tested for COVID-19 but the PCR returned negative.  His levofloxacin was refilled.  He is feeling better but still has the same cough that has been present for several months.  The cough is occasionally productive of light green sputum.  He does not think that his cough is as productive as it had been when he went into the hospital.  He is still requiring supplemental oxygen.  His daughter is concerned because she believes he is still losing weight.  Review of Systems: Review of Systems  Constitutional: Positive for diaphoresis, malaise/fatigue and weight loss. Negative for chills and fever.  HENT: Negative for congestion and sore throat.   Respiratory: Positive for cough, sputum production and shortness of breath.   Cardiovascular: Negative for chest pain.  Gastrointestinal: Positive for diarrhea, nausea and vomiting.  Skin: Negative for rash.  Neurological: Negative for headaches.      Past Medical History:  Diagnosis Date  . Arthritis   . Benign tumor of pituitary gland ([place])    "wasn't able to get it all"  . Pneumonia 06/27/11   "first time"  . Shingles     Social History   Tobacco Use  . Smoking status: Former Smoker    Packs/day: 0.50    Years: 10.00    Pack years: 5.00    Types: Cigarettes    Last attempt to quit: 08/03/1980    Years since quitting: 37.8  . Smokeless tobacco: Never Used  Substance Use Topics  . Alcohol use: Yes    Alcohol/week: 3.0 standard drinks    Types: 3 Glasses of wine per week  . Drug use: No    Family History  Problem Relation Age of Onset  . Heart disease Mother   . Cancer Brother   . Heart disease Brother    No Known Allergies  OBJECTIVE: There were no  vitals filed for this visit. There is no height or weight on file to calculate BMI.   Physical Exam Constitutional:      Comments: This was a telephone ED visit so no physical examination was performed.     Microbiology: No results found for this or any previous visit (from the past 240 hour(s)).  Michel Bickers, MD [facility] [phone] pager   [phone] cell 06/18/2018, 4:33 PM

## 2018-06-19 DIAGNOSIS — J449 Chronic obstructive pulmonary disease, unspecified: Secondary | ICD-10-CM | POA: Diagnosis not present

## 2018-06-19 DIAGNOSIS — K219 Gastro-esophageal reflux disease without esophagitis: Secondary | ICD-10-CM | POA: Diagnosis not present

## 2018-06-19 DIAGNOSIS — M059 Rheumatoid arthritis with rheumatoid factor, unspecified: Secondary | ICD-10-CM | POA: Diagnosis not present

## 2018-06-19 DIAGNOSIS — M1990 Unspecified osteoarthritis, unspecified site: Secondary | ICD-10-CM | POA: Diagnosis not present

## 2018-06-19 DIAGNOSIS — E785 Hyperlipidemia, unspecified: Secondary | ICD-10-CM | POA: Diagnosis not present

## 2018-06-19 DIAGNOSIS — E039 Hypothyroidism, unspecified: Secondary | ICD-10-CM | POA: Diagnosis not present

## 2018-06-24 DIAGNOSIS — M059 Rheumatoid arthritis with rheumatoid factor, unspecified: Secondary | ICD-10-CM | POA: Diagnosis not present

## 2018-06-24 DIAGNOSIS — K219 Gastro-esophageal reflux disease without esophagitis: Secondary | ICD-10-CM | POA: Diagnosis not present

## 2018-06-24 DIAGNOSIS — J449 Chronic obstructive pulmonary disease, unspecified: Secondary | ICD-10-CM | POA: Diagnosis not present

## 2018-06-24 DIAGNOSIS — M1990 Unspecified osteoarthritis, unspecified site: Secondary | ICD-10-CM | POA: Diagnosis not present

## 2018-06-24 DIAGNOSIS — E039 Hypothyroidism, unspecified: Secondary | ICD-10-CM | POA: Diagnosis not present

## 2018-06-24 DIAGNOSIS — E785 Hyperlipidemia, unspecified: Secondary | ICD-10-CM | POA: Diagnosis not present

## 2018-06-26 DIAGNOSIS — E039 Hypothyroidism, unspecified: Secondary | ICD-10-CM | POA: Diagnosis not present

## 2018-06-26 DIAGNOSIS — M1990 Unspecified osteoarthritis, unspecified site: Secondary | ICD-10-CM | POA: Diagnosis not present

## 2018-06-26 DIAGNOSIS — K219 Gastro-esophageal reflux disease without esophagitis: Secondary | ICD-10-CM | POA: Diagnosis not present

## 2018-06-26 DIAGNOSIS — M059 Rheumatoid arthritis with rheumatoid factor, unspecified: Secondary | ICD-10-CM | POA: Diagnosis not present

## 2018-06-26 DIAGNOSIS — E785 Hyperlipidemia, unspecified: Secondary | ICD-10-CM | POA: Diagnosis not present

## 2018-06-26 DIAGNOSIS — J449 Chronic obstructive pulmonary disease, unspecified: Secondary | ICD-10-CM | POA: Diagnosis not present

## 2018-06-30 DIAGNOSIS — J189 Pneumonia, unspecified organism: Secondary | ICD-10-CM | POA: Diagnosis not present

## 2018-07-01 DIAGNOSIS — J449 Chronic obstructive pulmonary disease, unspecified: Secondary | ICD-10-CM | POA: Diagnosis not present

## 2018-07-01 DIAGNOSIS — E785 Hyperlipidemia, unspecified: Secondary | ICD-10-CM | POA: Diagnosis not present

## 2018-07-01 DIAGNOSIS — M1990 Unspecified osteoarthritis, unspecified site: Secondary | ICD-10-CM | POA: Diagnosis not present

## 2018-07-01 DIAGNOSIS — M059 Rheumatoid arthritis with rheumatoid factor, unspecified: Secondary | ICD-10-CM | POA: Diagnosis not present

## 2018-07-01 DIAGNOSIS — K219 Gastro-esophageal reflux disease without esophagitis: Secondary | ICD-10-CM | POA: Diagnosis not present

## 2018-07-01 DIAGNOSIS — J189 Pneumonia, unspecified organism: Secondary | ICD-10-CM | POA: Diagnosis not present

## 2018-07-01 DIAGNOSIS — E039 Hypothyroidism, unspecified: Secondary | ICD-10-CM | POA: Diagnosis not present

## 2018-07-03 DIAGNOSIS — M1990 Unspecified osteoarthritis, unspecified site: Secondary | ICD-10-CM | POA: Diagnosis not present

## 2018-07-03 DIAGNOSIS — E785 Hyperlipidemia, unspecified: Secondary | ICD-10-CM | POA: Diagnosis not present

## 2018-07-03 DIAGNOSIS — E039 Hypothyroidism, unspecified: Secondary | ICD-10-CM | POA: Diagnosis not present

## 2018-07-03 DIAGNOSIS — M059 Rheumatoid arthritis with rheumatoid factor, unspecified: Secondary | ICD-10-CM | POA: Diagnosis not present

## 2018-07-03 DIAGNOSIS — J449 Chronic obstructive pulmonary disease, unspecified: Secondary | ICD-10-CM | POA: Diagnosis not present

## 2018-07-03 DIAGNOSIS — K219 Gastro-esophageal reflux disease without esophagitis: Secondary | ICD-10-CM | POA: Diagnosis not present

## 2018-07-08 DIAGNOSIS — E785 Hyperlipidemia, unspecified: Secondary | ICD-10-CM | POA: Diagnosis not present

## 2018-07-08 DIAGNOSIS — M1990 Unspecified osteoarthritis, unspecified site: Secondary | ICD-10-CM | POA: Diagnosis not present

## 2018-07-08 DIAGNOSIS — E039 Hypothyroidism, unspecified: Secondary | ICD-10-CM | POA: Diagnosis not present

## 2018-07-08 DIAGNOSIS — J449 Chronic obstructive pulmonary disease, unspecified: Secondary | ICD-10-CM | POA: Diagnosis not present

## 2018-07-08 DIAGNOSIS — M059 Rheumatoid arthritis with rheumatoid factor, unspecified: Secondary | ICD-10-CM | POA: Diagnosis not present

## 2018-07-08 DIAGNOSIS — K219 Gastro-esophageal reflux disease without esophagitis: Secondary | ICD-10-CM | POA: Diagnosis not present

## 2018-07-09 ENCOUNTER — Ambulatory Visit: Payer: Medicare Other | Admitting: Rheumatology

## 2018-07-09 DIAGNOSIS — E039 Hypothyroidism, unspecified: Secondary | ICD-10-CM | POA: Diagnosis not present

## 2018-07-09 DIAGNOSIS — J449 Chronic obstructive pulmonary disease, unspecified: Secondary | ICD-10-CM | POA: Diagnosis not present

## 2018-07-09 DIAGNOSIS — M1990 Unspecified osteoarthritis, unspecified site: Secondary | ICD-10-CM | POA: Diagnosis not present

## 2018-07-09 DIAGNOSIS — K219 Gastro-esophageal reflux disease without esophagitis: Secondary | ICD-10-CM | POA: Diagnosis not present

## 2018-07-09 DIAGNOSIS — M059 Rheumatoid arthritis with rheumatoid factor, unspecified: Secondary | ICD-10-CM | POA: Diagnosis not present

## 2018-07-09 DIAGNOSIS — E785 Hyperlipidemia, unspecified: Secondary | ICD-10-CM | POA: Diagnosis not present

## 2018-07-17 NOTE — Progress Notes (Signed)
Office Visit Note  Patient: Ryan Coffey             Date of Birth: 1932/04/21           MRN: [id number]             PCP: Prince Solian, MD Referring: Prince Solian, MD Visit Date: 07/30/2018 Occupation: _0 @  Subjective:  Medication management.    History of Present Illness: Ryan Coffey is a 85 y.o. male with history of rheumatoid arthritis.  He states he has been doing quite well as regards to her rheumatoid arthritis.  He has some limitations in his joints due to severe rheumatoid arthritis but no joint swelling or joint discomfort.  He has been tolerating methotrexate well.  He states in March she was hospitalized with pneumonia and completely recovered from that.  Activities of Daily Living:  Patient reports morning stiffness for 0 minutes.   Patient Denies nocturnal pain.  Difficulty dressing/grooming: Denies Difficulty climbing stairs: Denies Difficulty getting out of chair: Denies Difficulty using hands for taps, buttons, cutlery, and/or writing: Denies  Review of Systems  Constitutional: Negative for fatigue.  HENT: Negative for mouth sores, mouth dryness and nose dryness.   Eyes: Negative for pain, itching and dryness.  Respiratory: Negative for shortness of breath, wheezing and difficulty breathing.   Cardiovascular: Negative for chest pain, palpitations and swelling in legs/feet.  Gastrointestinal: Negative for abdominal pain, constipation and diarrhea.  Endocrine: Negative for increased urination.  Genitourinary: Negative for painful urination.  Musculoskeletal: Negative for arthralgias, joint pain, joint swelling and morning stiffness.  Skin: Negative for rash and redness.  Allergic/Immunologic: Negative for susceptible to infections.  Neurological: Negative for dizziness, light-headedness, headaches, memory loss and weakness.  Hematological: Negative for bruising/bleeding tendency.  Psychiatric/Behavioral: Negative for confusion and sleep  disturbance. The patient is not nervous/anxious.     PMFS History:  Patient Active Problem List   Diagnosis Date Noted  . Unintentional weight loss 06/18/2018  . Chronic cough 06/18/2018  . COPD (chronic obstructive pulmonary disease) ([place]) 08/29/2016  . Rheumatoid nodulosis ([place]) 08/24/2016  . High risk medication use 08/24/2016  . Thrombocytosis ([place]) 08/24/2016  . Elevated LFTs mild elevation of ALT  08/24/2016  . History of renal cell carcinoma 08/24/2016  . History of prostate cancer 08/24/2016  . History of lung cancer 08/24/2016  . History of adrenal insufficiency 08/24/2016  . HCAP (healthcare-associated pneumonia) 06/27/2011    Class: Acute  . Seropositive rheumatoid arthritis ([place]) 06/27/2011  . Panhypopituitarism ([place]) 06/27/2011  . Degenerative joint disease 06/27/2011  . History of tobacco use 06/27/2011  . Hyperlipidemia 06/27/2011    Past Medical History:  Diagnosis Date  . Arthritis   . Benign tumor of pituitary gland ([place])    "wasn't able to get it all"  . Pneumonia 06/27/11   "first time"  . Shingles     Family History  Problem Relation Age of Onset  . Heart disease Mother   . Cancer Brother   . Heart disease Brother    Past Surgical History:  Procedure Laterality Date  . CHOLECYSTECTOMY OPEN  2010  . KIDNEY SURGERY  2011   "lesion removed right side"  . RENAL BIOPSY  2012   right  . TRANSPHENOIDAL / TRANSNASAL HYPOPHYSECTOMY / RESECTION PITUITARY TUMOR  ~ 1979   "they couldn't get it all"   Social History   Social History Narrative  . Not on file   Immunization History  Administered Date(s) Administered  .  Pneumococcal-Unspecified 06/04/2006     Objective: Vital Signs: BP 136/82 (BP Location: Left Arm, Patient Position: Sitting, Cuff Size: Normal)   Pulse 88   Resp 15   Ht _0  (1.727 m)   Wt 152 lb (68.9 kg)   BMI 23.11 kg/m    Physical Exam Vitals signs and nursing note reviewed.  Constitutional:      Appearance: He is  well-developed.  HENT:     Head: Normocephalic and atraumatic.  Eyes:     Conjunctiva/sclera: Conjunctivae normal.     Pupils: Pupils are equal, round, and reactive to light.  Neck:     Musculoskeletal: Normal range of motion and neck supple.  Cardiovascular:     Rate and Rhythm: Normal rate and regular rhythm.     Heart sounds: Normal heart sounds.  Pulmonary:     Effort: Pulmonary effort is normal.     Breath sounds: Normal breath sounds.  Abdominal:     General: Bowel sounds are normal.     Palpations: Abdomen is soft.  Skin:    General: Skin is warm and dry.     Capillary Refill: Capillary refill takes less than 2 seconds.  Neurological:     Mental Status: He is alert and oriented to person, place, and time.  Psychiatric:        Behavior: Behavior normal.      Musculoskeletal Exam: Thoracic kyphosis noted.  He has some limitation range of motion of his lumbar spine.  Shoulder joints and elbow joints with good range of motion.  He has limited range of motion of his wrist joints.  He has bilateral ulnar deviation more severe in his left hand with subluxation of most of his MCP joints and incomplete fist formation and extension of his fingers.  He has nodulosis on his hands.  Knee joints ankles were in good range of motion.  There is time MTP and PIP changes consistent with rheumatoid arthritis.  No synovitis was noted on examination today.  CDAI Exam: CDAI Score: 0.3  Patient Global Assessment: 1 (mm); Provider Global Assessment: 2 (mm) Swollen: 0 ; Tender: 0  Joint Exam   Not documented   There is currently no information documented on the homunculus. Go to the Rheumatology activity and complete the homunculus joint exam.  Investigation: No additional findings.  Imaging: No results found.  Recent Labs: Lab Results  Component Value Date   WBC 9.0 05/17/2018   HGB 11.3 (L) 05/17/2018   PLT 358 05/17/2018   NA 140 05/20/2018   K 4.4 05/20/2018   CL 104 05/20/2018    CO2 29 05/20/2018   GLUCOSE 122 (H) 05/20/2018   BUN 23 05/20/2018   CREATININE 0.86 05/20/2018   BILITOT 1.7 (H) 05/15/2018   ALKPHOS 53 05/15/2018   AST 24 05/15/2018   ALT 34 05/15/2018   PROT 6.3 (L) 05/15/2018   ALBUMIN 3.7 05/17/2018   CALCIUM 8.6 (L) 05/20/2018   GFRAA >60 05/20/2018    Speciality Comments: No specialty comments available.  Procedures:  No procedures performed Allergies: Patient has no known allergies.   Assessment / Plan:     Visit Diagnoses: Seropositive rheumatoid arthritis ([place]) - +RF. Severe erosive disease with subluxation in multiple joints. He is been on methotrexate for 25+ years.  He has no synovitis on examination today.  He has been tolerating methotrexate well.  High risk medication use - He is on methotrexate 4 tablets on Saturday and 4 tablets on Sunday as well as  folic acid 1 mg 2 tablets daily.  Most recent CBC showed anemia on 05/17/2018.  Most recent BMP within normal limits except for low calcium on 05/20/2018.  CMP from 05/15/2018 showed normal AST/ALT/alk phos.  Will monitor CBC/CMP every 3 months and standing orders are in place.  Recommend annual influenza, Pneumovax 23, Prevnar 13, and Shingrix as indicated.- Plan: CBC with Differential/Platelet, COMPLETE METABOLIC PANEL WITH GFR  Rheumatoid nodulosis (HCC)-he still has few rheumatoid nodules.  Other medical problems are listed as follows:  Panhypopituitarism ([place])  History of adrenal insufficiency  History of kyphosis-patient states that he has been getting bone density through Dr. Redge Gainer office.  History of renal cell carcinoma  History of peptic ulcer disease  History of prostate cancer  History of hyperlipidemia  History of lung cancer  History of COPD  History of kidney stones  History of tobacco use   Orders: Orders Placed This Encounter  Procedures  . CBC with Differential/Platelet  . COMPLETE METABOLIC PANEL WITH GFR   No orders of the defined types were  placed in this encounter.     Follow-Up Instructions: Return in about 5 months (around 12/30/2018) for Rheumatoid arthritis.   Bo Merino, MD  Note - This record has been created using Editor, commissioning.  Chart creation errors have been sought, but may not always  have been located. Such creation errors do not reflect on  the standard of medical care.

## 2018-07-23 DIAGNOSIS — E291 Testicular hypofunction: Secondary | ICD-10-CM | POA: Diagnosis not present

## 2018-07-30 ENCOUNTER — Ambulatory Visit (INDEPENDENT_AMBULATORY_CARE_PROVIDER_SITE_OTHER): Payer: Medicare Other | Admitting: Rheumatology

## 2018-07-30 ENCOUNTER — Other Ambulatory Visit: Payer: Self-pay

## 2018-07-30 ENCOUNTER — Encounter: Payer: Self-pay | Admitting: Rheumatology

## 2018-07-30 VITALS — BP 136/82 | HR 88 | Resp 15 | Ht 68.0 in | Wt 152.0 lb

## 2018-07-30 DIAGNOSIS — Z8739 Personal history of other diseases of the musculoskeletal system and connective tissue: Secondary | ICD-10-CM

## 2018-07-30 DIAGNOSIS — Z8639 Personal history of other endocrine, nutritional and metabolic disease: Secondary | ICD-10-CM

## 2018-07-30 DIAGNOSIS — Z79899 Other long term (current) drug therapy: Secondary | ICD-10-CM | POA: Diagnosis not present

## 2018-07-30 DIAGNOSIS — M0630 Rheumatoid nodule, unspecified site: Secondary | ICD-10-CM

## 2018-07-30 DIAGNOSIS — Z85118 Personal history of other malignant neoplasm of bronchus and lung: Secondary | ICD-10-CM | POA: Diagnosis not present

## 2018-07-30 DIAGNOSIS — Z85528 Personal history of other malignant neoplasm of kidney: Secondary | ICD-10-CM

## 2018-07-30 DIAGNOSIS — Z8546 Personal history of malignant neoplasm of prostate: Secondary | ICD-10-CM | POA: Diagnosis not present

## 2018-07-30 DIAGNOSIS — Z87442 Personal history of urinary calculi: Secondary | ICD-10-CM | POA: Diagnosis not present

## 2018-07-30 DIAGNOSIS — Z8709 Personal history of other diseases of the respiratory system: Secondary | ICD-10-CM | POA: Diagnosis not present

## 2018-07-30 DIAGNOSIS — E230 Hypopituitarism: Secondary | ICD-10-CM | POA: Diagnosis not present

## 2018-07-30 DIAGNOSIS — Z8711 Personal history of peptic ulcer disease: Secondary | ICD-10-CM | POA: Diagnosis not present

## 2018-07-30 DIAGNOSIS — M059 Rheumatoid arthritis with rheumatoid factor, unspecified: Secondary | ICD-10-CM

## 2018-07-30 DIAGNOSIS — Z87891 Personal history of nicotine dependence: Secondary | ICD-10-CM

## 2018-07-30 NOTE — Patient Instructions (Signed)
Standing Labs We placed an order today for your standing lab work.    Please come back and get your standing labs in September and every 3 months  We have open lab daily Monday through Thursday from 8:30-12:30 PM and 1:30-4:30 PM and Friday from 8:30-12:30 PM and 1:30 -4:00 PM at the office of Dr. Voncille Simm.   You may experience shorter wait times on Monday and Friday afternoons. The office is located at [address], [address], [place], [place] [address] No appointment is necessary.   Labs are drawn by [facility].  You may receive a bill from [facility] for your lab work.  If you wish to have your labs drawn at another location, please call the office 24 hours in advance to send orders.  If you have any questions regarding directions or hours of operation,  please call [phone].   Just as a reminder please drink plenty of water prior to coming for your lab work. Thanks!  

## 2018-07-31 LAB — COMPLETE METABOLIC PANEL WITH GFR
AG Ratio: 1.3 (calc) (ref 1.0–2.5)
ALT: 18 U/L (ref 9–46)
AST: 22 U/L (ref 10–35)
Albumin: 3.8 g/dL (ref 3.6–5.1)
Alkaline phosphatase (APISO): 67 U/L (ref 35–144)
BUN: 18 mg/dL (ref 7–25)
CO2: 29 mmol/L (ref 20–32)
Calcium: 9.6 mg/dL (ref 8.6–10.3)
Chloride: 99 mmol/L (ref 98–110)
Creat: 0.84 mg/dL (ref 0.70–1.11)
GFR, Est African American: 94 mL/min/{1.73_m2} (ref >=60)
GFR, Est Non African American: 81 mL/min/{1.73_m2} (ref >=60)
Globulin: 2.9 g/dL (calc) (ref 1.9–3.7)
Glucose, Bld: 88 mg/dL (ref 65–99)
Potassium: 4.7 mmol/L (ref 3.5–5.3)
Sodium: 136 mmol/L (ref 135–146)
Total Bilirubin: 0.6 mg/dL (ref 0.2–1.2)
Total Protein: 6.7 g/dL (ref 6.1–8.1)

## 2018-07-31 LAB — CBC WITH DIFFERENTIAL/PLATELET
Absolute Monocytes: 609 cells/uL (ref 200–950)
Basophils Absolute: 95 cells/uL (ref 0–200)
Basophils Relative: 0.9 %
Eosinophils Absolute: 221 cells/uL (ref 15–500)
Eosinophils Relative: 2.1 %
HCT: 40.6 % (ref 38.5–50.0)
Hemoglobin: 13.3 g/dL (ref 13.2–17.1)
Lymphs Abs: 1124 cells/uL (ref 850–3900)
MCH: 30.7 pg (ref 27.0–33.0)
MCHC: 32.8 g/dL (ref 32.0–36.0)
MCV: 93.8 fL (ref 80.0–100.0)
MPV: 9.4 fL (ref 7.5–12.5)
Monocytes Relative: 5.8 %
Neutro Abs: 8453 cells/uL — ABNORMAL HIGH (ref 1500–7800)
Neutrophils Relative %: 80.5 %
Platelets: 446 10*3/uL — ABNORMAL HIGH (ref 140–400)
RBC: 4.33 10*6/uL (ref 4.20–5.80)
RDW: 15.3 % — ABNORMAL HIGH (ref 11.0–15.0)
Total Lymphocyte: 10.7 %
WBC: 10.5 10*3/uL (ref 3.8–10.8)

## 2018-07-31 NOTE — Progress Notes (Signed)
Labs are stable.

## 2018-08-04 ENCOUNTER — Other Ambulatory Visit: Payer: Self-pay | Admitting: Rheumatology

## 2018-08-04 NOTE — Telephone Encounter (Signed)
Last visit: 07/30/18 Next Visit: 12/30/18  Okay to refill per Dr. Estanislado Pandy

## 2018-08-08 DIAGNOSIS — R918 Other nonspecific abnormal finding of lung field: Secondary | ICD-10-CM | POA: Diagnosis not present

## 2018-08-08 DIAGNOSIS — M810 Age-related osteoporosis without current pathological fracture: Secondary | ICD-10-CM | POA: Diagnosis not present

## 2018-08-08 DIAGNOSIS — E291 Testicular hypofunction: Secondary | ICD-10-CM | POA: Diagnosis not present

## 2018-08-08 DIAGNOSIS — E7849 Other hyperlipidemia: Secondary | ICD-10-CM | POA: Diagnosis not present

## 2018-08-08 DIAGNOSIS — E041 Nontoxic single thyroid nodule: Secondary | ICD-10-CM | POA: Diagnosis not present

## 2018-08-08 DIAGNOSIS — Z125 Encounter for screening for malignant neoplasm of prostate: Secondary | ICD-10-CM | POA: Diagnosis not present

## 2018-08-08 DIAGNOSIS — R82998 Other abnormal findings in urine: Secondary | ICD-10-CM | POA: Diagnosis not present

## 2018-08-21 DIAGNOSIS — E291 Testicular hypofunction: Secondary | ICD-10-CM | POA: Diagnosis not present

## 2018-08-25 DIAGNOSIS — E291 Testicular hypofunction: Secondary | ICD-10-CM | POA: Diagnosis not present

## 2018-08-25 DIAGNOSIS — E230 Hypopituitarism: Secondary | ICD-10-CM | POA: Diagnosis not present

## 2018-08-25 DIAGNOSIS — E785 Hyperlipidemia, unspecified: Secondary | ICD-10-CM | POA: Diagnosis not present

## 2018-08-25 DIAGNOSIS — Z1339 Encounter for screening examination for other mental health and behavioral disorders: Secondary | ICD-10-CM | POA: Diagnosis not present

## 2018-08-25 DIAGNOSIS — C649 Malignant neoplasm of unspecified kidney, except renal pelvis: Secondary | ICD-10-CM | POA: Diagnosis not present

## 2018-08-25 DIAGNOSIS — J449 Chronic obstructive pulmonary disease, unspecified: Secondary | ICD-10-CM | POA: Diagnosis not present

## 2018-08-25 DIAGNOSIS — M810 Age-related osteoporosis without current pathological fracture: Secondary | ICD-10-CM | POA: Diagnosis not present

## 2018-08-25 DIAGNOSIS — Z0000 Encounter for general adult medical examination without abnormal findings: Secondary | ICD-10-CM | POA: Diagnosis not present

## 2018-08-25 DIAGNOSIS — E039 Hypothyroidism, unspecified: Secondary | ICD-10-CM | POA: Diagnosis not present

## 2018-08-25 DIAGNOSIS — Z1331 Encounter for screening for depression: Secondary | ICD-10-CM | POA: Diagnosis not present

## 2018-08-25 DIAGNOSIS — J189 Pneumonia, unspecified organism: Secondary | ICD-10-CM | POA: Diagnosis not present

## 2018-08-25 DIAGNOSIS — Z20818 Contact with and (suspected) exposure to other bacterial communicable diseases: Secondary | ICD-10-CM | POA: Diagnosis not present

## 2018-08-25 DIAGNOSIS — M069 Rheumatoid arthritis, unspecified: Secondary | ICD-10-CM | POA: Diagnosis not present

## 2018-08-25 DIAGNOSIS — E041 Nontoxic single thyroid nodule: Secondary | ICD-10-CM | POA: Diagnosis not present

## 2018-08-27 ENCOUNTER — Other Ambulatory Visit: Payer: Self-pay | Admitting: Internal Medicine

## 2018-08-27 DIAGNOSIS — E041 Nontoxic single thyroid nodule: Secondary | ICD-10-CM

## 2018-08-29 ENCOUNTER — Encounter: Payer: Self-pay | Admitting: Pulmonary Disease

## 2018-08-29 ENCOUNTER — Other Ambulatory Visit: Payer: Self-pay | Admitting: Rheumatology

## 2018-08-29 ENCOUNTER — Ambulatory Visit (INDEPENDENT_AMBULATORY_CARE_PROVIDER_SITE_OTHER): Payer: Medicare Other | Admitting: Pulmonary Disease

## 2018-08-29 ENCOUNTER — Other Ambulatory Visit: Payer: Self-pay

## 2018-08-29 VITALS — BP 140/76 | HR 83 | Temp 98.3°F | Ht 68.0 in | Wt 152.8 lb

## 2018-08-29 DIAGNOSIS — J438 Other emphysema: Secondary | ICD-10-CM | POA: Insufficient documentation

## 2018-08-29 DIAGNOSIS — J449 Chronic obstructive pulmonary disease, unspecified: Secondary | ICD-10-CM

## 2018-08-29 LAB — CBC WITH DIFFERENTIAL/PLATELET
Basophils Absolute: 0.1 10*3/uL (ref 0.0–0.1)
Basophils Relative: 0.9 % (ref 0.0–3.0)
Eosinophils Absolute: 0.1 10*3/uL (ref 0.0–0.7)
Eosinophils Relative: 1.5 % (ref 0.0–5.0)
HCT: 39.1 % (ref 39.0–52.0)
Hemoglobin: 12.8 g/dL — ABNORMAL LOW (ref 13.0–17.0)
Lymphocytes Relative: 9.6 % — ABNORMAL LOW (ref 12.0–46.0)
Lymphs Abs: 0.9 10*3/uL (ref 0.7–4.0)
MCHC: 32.7 g/dL (ref 30.0–36.0)
MCV: 96.3 fl (ref 78.0–100.0)
Monocytes Absolute: 0.6 10*3/uL (ref 0.1–1.0)
Monocytes Relative: 6.6 % (ref 3.0–12.0)
Neutro Abs: 8 10*3/uL — ABNORMAL HIGH (ref 1.4–7.7)
Neutrophils Relative %: 81.4 % — ABNORMAL HIGH (ref 43.0–77.0)
Platelets: 473 10*3/uL — ABNORMAL HIGH (ref 150.0–400.0)
RBC: 4.06 Mil/uL — ABNORMAL LOW (ref 4.22–5.81)
RDW: 17.3 % — ABNORMAL HIGH (ref 11.5–15.5)
WBC: 9.8 10*3/uL (ref 4.0–10.5)

## 2018-08-29 MED ORDER — TRELEGY ELLIPTA 100-62.5-25 MCG/INH IN AEPB: 1.0000 | INHALATION_SPRAY | Freq: Every day | RESPIRATORY_TRACT | 5 refills | Status: DC

## 2018-08-29 MED ORDER — TRELEGY ELLIPTA 100-62.5-25 MCG/INH IN AEPB: 1.0000 | INHALATION_SPRAY | Freq: Every day | RESPIRATORY_TRACT | 0 refills | Status: DC

## 2018-08-29 NOTE — Progress Notes (Addendum)
Ryan Coffey    [id number]    [birth year]  Primary Care Physician:Avva, Steva Ready, MD  Referring Physician: Prince Solian, MD [address] [place],  [place] [address]  Chief complaint: Evaluation for COPD  HPI: 85 year old with history of seropositive rheumatoid arthritis, renal cell cancer in remission, hyperlipidemia, pituitary tumor.  Admission in March 2020 for multilobar pneumonia.  He was not tested for COVID at that time.  Follow-up with ID in April 2020-it was felt that his earlier presentation was consistent with COVID infection. COVID test in April was negative but could have been a false negative Referred to pulmonary for evaluation of suspected COPD given findings of emphysema on CT scan  Complains of chronic cough for the past 3 months.  Has white mucus.  Denies any dyspnea No heartburn symptoms or seasonal allergies He has history of seropositive rheumatoid arthritis and has been on methotrexate for 25+ years.  Pets: No pets Occupation: Retired Music therapist Exposures: No known exposures, no mold, hot tub, Jacuzzi Smoking history: 10-pack-year smoker.  Quit in 1982 Travel history: No significant recent travel Relevant family history: Father had emphysema.  He was a smoker.  Outpatient Encounter Medications as of 08/29/2018  Medication Sig  . acetaminophen (TYLENOL) 325 MG tablet Take 650 mg by mouth 2 (two) times daily as needed (arthritis).  Marland Kitchen albuterol (PROVENTIL HFA;VENTOLIN HFA) 108 (90 Base) MCG/ACT inhaler Inhale 2 puffs into the lungs every 6 (six) hours as needed for wheezing or shortness of breath.  . bromocriptine (PARLODEL) 2.5 MG tablet Take 2.5 mg by mouth 2 (two) times daily.  . Calcium Carbonate-Vitamin D (CALCIUM 500 + D) 500-125 MG-UNIT TABS Take 1 tablet by mouth 2 (two) times daily.  . folic acid (FOLVITE) 1 MG tablet TAKE 2 TABLETS EVERY DAY  . guaiFENesin-dextromethorphan (ROBITUSSIN DM) 100-10 MG/5ML syrup Take 10 mLs by  mouth every 4 (four) hours as needed for cough.  . hydrocortisone (CORTEF) 20 MG tablet Take 10-20 mg by mouth daily. 20 mg in the morning and 10mg  at night  . levofloxacin (LEVAQUIN) 750 MG tablet Take 1 tablet (750 mg total) by mouth daily.  Marland Kitchen levothyroxine (SYNTHROID, LEVOTHROID) 137 MCG tablet Take 137 mcg by mouth daily.    . methotrexate (RHEUMATREX) 2.5 MG tablet TAKE 4 TABLETS ON SATURDAY AND 4 TABLETS ON SUNDAY. CAUTION:CHEMOTHERAPY. PROTECT FROM LIGHT.  . Multiple Vitamins-Minerals (CENTRUM SILVER PO) Take 1 tablet by mouth daily.   . pantoprazole (PROTONIX) 40 MG tablet Take 40 mg by mouth 2 (two) times daily.  . pravastatin (PRAVACHOL) 80 MG tablet Take 80 mg by mouth daily.  Marland Kitchen testosterone cypionate (DEPOTESTOSTERONE CYPIONATE) 200 MG/ML injection Inject 75 mg into the muscle every 14 (fourteen) days.   Marland Kitchen zolpidem (AMBIEN CR) 6.25 MG CR tablet Take 1 tablet (6.25 mg total) by mouth at bedtime as needed for sleep.  . [DISCONTINUED] methotrexate (RHEUMATREX) 2.5 MG tablet TAKE 4 TABLETS ON SATURDAY AND 4 TABLETS ON SUNDAY. CAUTION:CHEMOTHERAPY. PROTECT FROM LIGHT.   No facility-administered encounter medications on file as of 08/29/2018.     Allergies as of 08/29/2018  . (No Known Allergies)    Past Medical History:  Diagnosis Date  . Arthritis   . Benign tumor of pituitary gland ([place])    "wasn't able to get it all"  . Pneumonia 06/27/11   "first time"  . Shingles     Past Surgical History:  Procedure Laterality Date  . CHOLECYSTECTOMY OPEN  2010  .  KIDNEY SURGERY  2011   "lesion removed right side"  . RENAL BIOPSY  2012   right  . TRANSPHENOIDAL / TRANSNASAL HYPOPHYSECTOMY / RESECTION PITUITARY TUMOR  ~ 1979   "they couldn't get it all"    Family History  Problem Relation Age of Onset  . Heart disease Mother   . Cancer Brother   . Heart disease Brother     Social History   Socioeconomic History  . Marital status: Married    Spouse name: Not on file  .  Number of children: Not on file  . Years of education: Not on file  . Highest education level: Not on file  Occupational History  . Not on file  Social Needs  . Financial resource strain: Not on file  . Food insecurity    Worry: Not on file    Inability: Not on file  . Transportation needs    Medical: Not on file    Non-medical: Not on file  Tobacco Use  . Smoking status: Former Smoker    Packs/day: 0.50    Years: 10.00    Pack years: 5.00    Types: Cigarettes    Quit date: 08/03/1980    Years since quitting: 38.0  . Smokeless tobacco: Never Used  Substance and Sexual Activity  . Alcohol use: Yes    Alcohol/week: 3.0 standard drinks    Types: 3 Glasses of wine per week  . Drug use: No  . Sexual activity: Yes  Lifestyle  . Physical activity    Days per week: Not on file    Minutes per session: Not on file  . Stress: Not on file  Relationships  . Social Herbalist on phone: Not on file    Gets together: Not on file    Attends religious service: Not on file    Active member of club or organization: Not on file    Attends meetings of clubs or organizations: Not on file    Relationship status: Not on file  . Intimate partner violence    Fear of current or ex partner: Not on file    Emotionally abused: Not on file    Physically abused: Not on file    Forced sexual activity: Not on file  Other Topics Concern  . Not on file  Social History Narrative  . Not on file    Review of systems: Review of Systems  Constitutional: Negative for fever and chills.  HENT: Negative.   Eyes: Negative for blurred vision.  Respiratory: as per HPI  Cardiovascular: Negative for chest pain and palpitations.  Gastrointestinal: Negative for vomiting, diarrhea, blood per rectum. Genitourinary: Negative for dysuria, urgency, frequency and hematuria.  Musculoskeletal: Negative for myalgias, back pain and joint pain.  Skin: Negative for itching and rash.  Neurological: Negative for  dizziness, tremors, focal weakness, seizures and loss of consciousness.  Endo/Heme/Allergies: Negative for environmental allergies.  Psychiatric/Behavioral: Negative for depression, suicidal ideas and hallucinations.  All other systems reviewed and are negative.  Physical Exam: Blood pressure 140/76, pulse 83, temperature 98.3 F (36.8 C), temperature source Oral, height 5\' 8"  (1.727 m), weight 152 lb 12.8 oz (69.3 kg), SpO2 95 %. Gen:      No acute distress HEENT:  EOMI, sclera anicteric Neck:     No masses; no thyromegaly Lungs:    Clear to auscultation bilaterally; normal respiratory effort CV:         Regular rate and rhythm; no murmurs Abd:      +  bowel sounds; soft, non-tender; no palpable masses, no distension Ext:    No edema; adequate peripheral perfusion. Bilateral ulnar deviation of the fingers left greater than right with rheumatoid nodules. Skin:      Warm and dry; no rash Neuro: alert and oriented x 3 Psych: normal mood and affect  Data Reviewed: Imaging: CTA 05/15/2018-no pulmonary embolus, emphysema with bilateral lower lobe and right middle lobe tree-in-bud, peribronchial opacities consistent with pneumonia.  Small cystic lesions in both upper kidneys.  Chronic thyroid goiter.  Calcified aortic and coronary atherosclerosis.  I have reviewed the images personally  Chest x-ray (primary care) 06/09/2018- right lower lobe infiltrate.  Chest x-ray (primary care) 06/23/2018-resolving but persistent infiltrate of right lung base  Labs: Tested for SARS-CoV-2 at primary care on 06/17/2018-negative  CBC 07/30/2018-WBC 10.5, eos 2.1%, absolute eosinophil count 221  Assessment:  Consult for COPD Reviewed his recent imaging with CT scan showing emphysematous changes.  We will schedule PFTs to evaluate for COPD and trial Trelegy inhaler Peripheral eosinophils are 221 so he will benefit from inhaled steroids  He does have minimal smoking history.  Family history of emphysema though  father was a smoker Check CBC differential, IgE and alpha-1 antitrypsin trypsin levels and phenotype for baseline evaluation  Seropositive rheumatoid arthritis CT scan also shows reticular changes with possible underlying interstitial lung disease. Suspect RA ILD Schedule high-resolution CT for evaluation.  Cystic lesions in the kidney CT during admission in March showed bilateral renal cystic lesions.  Follow-up dedicated imaging of kidneys were advised.  I will CC primary care as I am not sure if this has been done yet.  Plan/Recommendations: - CBC differential, IgE, alpha-1 antitrypsin - PFTs - High-resolution CT - Start Trelegy inhaler  Marshell Garfinkel MD [facility] 08/29/2018, 10:06 AM  CC: Prince Solian, MD

## 2018-08-29 NOTE — Patient Instructions (Signed)
I have reviewed your imaging.  There is evidence of emphysema which may indicate a condition called COPD We will evaluate this with pulmonary function test, CBC differential, IgE and alpha-1 antitrypsin levels and phenotype We will start you on an inhaler called Trelegy  We also want to evaluate you for interstitial lung disease which is a condition that could result from rheumatoid arthritis We will schedule a high-resolution CT to evaluate this  Follow-up in 1 month.

## 2018-08-29 NOTE — Telephone Encounter (Signed)
Last visit: 07/30/18 Next Visit: 12/30/18 Labs: 07/30/18 stable   Okay to refill per Dr. Estanislado Pandy

## 2018-08-29 NOTE — Addendum Note (Signed)
Addended by: Elton Sin on: 08/29/2018 11:10 AM   Modules accepted: Orders

## 2018-09-04 LAB — ALPHA-1 ANTITRYPSIN PHENOTYPE: A-1 Antitrypsin, Ser: 196 mg/dL (ref 83–199)

## 2018-09-04 LAB — IGE: IgE (Immunoglobulin E), Serum: 3 kU/L (ref <=114)

## 2018-09-09 ENCOUNTER — Institutional Professional Consult (permissible substitution): Payer: Medicare Other | Admitting: Pulmonary Disease

## 2018-09-09 ENCOUNTER — Other Ambulatory Visit: Payer: Self-pay | Admitting: Rheumatology

## 2018-09-10 ENCOUNTER — Telehealth: Payer: Self-pay | Admitting: Rheumatology

## 2018-09-10 ENCOUNTER — Other Ambulatory Visit: Payer: Self-pay | Admitting: Rheumatology

## 2018-09-10 ENCOUNTER — Other Ambulatory Visit: Payer: Medicare Other

## 2018-09-10 NOTE — Telephone Encounter (Signed)
Patient left a voicemail stating he called [facility] as instructed and was told that they did not receive the prescription.  Patient states [facility] requested you call and give authorization over the phone.

## 2018-09-10 NOTE — Telephone Encounter (Signed)
Prescription has been resent to the pharmacy.

## 2018-09-10 NOTE — Telephone Encounter (Signed)
Last visit: 07/30/18 Next Visit: 12/30/18 Labs: 07/30/18 stable   Okay to refill per Dr. Estanislado Pandy

## 2018-09-10 NOTE — Telephone Encounter (Signed)
Patient advised prescription was sent to the pharmacy on 08/29/18 for a 90 day supply. Patient will contact pharmacy to check on refill.

## 2018-09-10 NOTE — Telephone Encounter (Signed)
Patient left a voicemail requesting prescription refill of Methotrexate to be sent to [facility] at [address].  Patient states he received a call from [facility] stating Dr. Estanislado Pandy "didn't approve refill request that was sent to the office."  Patient is requesting a return call.

## 2018-09-11 ENCOUNTER — Telehealth: Payer: Self-pay | Admitting: Pulmonary Disease

## 2018-09-11 MED ORDER — NYSTATIN 100000 UNIT/ML MT SUSP: 5.0000 mL | Freq: Four times a day (QID) | OROMUCOSAL | 0 refills | Status: DC

## 2018-09-11 MED ORDER — BUDESONIDE-FORMOTEROL FUMARATE 80-4.5 MCG/ACT IN AERO: 2.0000 | INHALATION_SPRAY | Freq: Two times a day (BID) | RESPIRATORY_TRACT | 12 refills | Status: DC

## 2018-09-11 NOTE — Telephone Encounter (Signed)
Called and spoke with pt and daughter Ivin Booty stating to them that Dr. Vaughan Browner has [place] to go back with pt when he comes for appt 7/20. Stated to both of them that they both will need to wear a mask and both verbalized understanding. Nothing further needed.

## 2018-09-11 NOTE — Telephone Encounter (Signed)
Will order nystatin. Patient may have thrush from inhaler. Continue to rinse mouth regularly and brush teeth and tongue 3 times daily. May stop trelegy. Please order Symbicort 80 2 puffs twice daily until seen by Dr. Vaughan Browner.

## 2018-09-11 NOTE — Telephone Encounter (Signed)
Called and spoke with pt letting him know that [facility] sent in Rx for nystatin thinking that the issues he is having from the Trelegy is due to thrush. Stated to pt that [facility] said it was okay for him to stop Trelegy and we were going to send in Rx for Symbicort for pt to take until next OV. Pt verbalized understanding.  While speaking with pt and daughter  Ryan Coffey, I also scheduled pt a f/u appt with Dr. Vaughan Browner due to pt having CT scheduled. Ryan Coffey stated that she is pt's caregiver and wanted to know if it would be okay for her to come to the appt with pt so she knows what all is going to be mentioned at the appt in regards to pt's care.  Dr.Mannam, please advise if you are okay with pt's daughter Ryan Coffey coming back with pt when pt comes for appt with you 7/20? Thanks!

## 2018-09-11 NOTE — Telephone Encounter (Signed)
Primary Pulmonologist: Dr. Vaughan Browner Last office visit and with whom: 08/29/2018 with Dr. Vaughan Browner What do we see them for (pulmonary problems): COPD Last OV assessment/plan: Instructions    Return in about 4 weeks (around 09/26/2018). I have reviewed your imaging.  There is evidence of emphysema which may indicate a condition called COPD We will evaluate this with pulmonary function test, CBC differential, IgE and alpha-1 antitrypsin levels and phenotype We will start you on an inhaler called Trelegy  We also want to evaluate you for interstitial lung disease which is a condition that could result from rheumatoid arthritis We will schedule a high-resolution CT to evaluate this  Follow-up in 1 month.     Was appointment offered to patient (explain)?  Pt and daughter Ivin Booty want recommendations   Reason for call: called and spoke with both pt and daughter Ivin Booty. Per pt, at consult with Dr. Vaughan Browner on 6/26, pt was given a sample of Trelegy. Pt stated he did begin taking the Trelegy that same day. Since pt began using the Trelegy, pt's taste has been all messed up and due to an awful taste in his mouth, he has not felt like eating and since he has not felt like eating, he has had no energy to do any physical activities he usually does.  Pt said that he did stop using the Trelegy and stated he has not used it all this week and stated he might have even stopped using it over the weekend, possibly 7/4 but unsure. Pt said that he still has an awful taste in his mouth even after stopping the Trelegy.  Pt said while he was using the Trelegy, he did rinse his mouth out after using it and after rinsing his mouth out, pt still had an awful taste in his mouth so he rinsed his mouth out even more.  Due to all that has been going on, pt wants to know if this might be a side effect that he is having from the inhaler. Pt wants to know what we recommend. Pt denies any fever just has the awful taste in his mouth  which began after beginning the Trelegy which has led to not wanting to eat and then no energy due to that. Tonya, please advise on this. Thanks!

## 2018-09-11 NOTE — Telephone Encounter (Signed)
Script Screening patients for COVID-19 and reviewing new operational procedures  Greeting - The reason I am calling is to share with you some new changes to our processes that are designed to help [name] keep everyone safe. Is now a good time to speak with you? Patient says "no' - ask them when you can call back and let them know it's important to do this prior to their appointment.  Patient says "yes" - Doristine Devoid, [place] the first thing I need to do is ask you some screening Questions.  1. To the best of your knowledge, have you been in close contact with any one with a confirmed diagnosis of COVID 19? o No - proceed to next question  2. Have you had any one or more of the following: fever, chills, cough, shortness of breath or any flu-like symptoms? o No - proceed to next question  3. Have you been diagnosed with or have a previous diagnosis of COVID 19? o No - proceed to next question  4. I am going to go over a few other symptoms with you. Please let me know if you are experiencing any of the following: . Ear, nose or throat discomfort . A sore throat . Headache . Muscle pain . Diarrhea . Loss of taste or smell o No - proceed to next question  Thank you for answering these questions. Please know we will ask you these questions or similar questions when you arrive for your appointment and again it's how we are keeping everyone safe. Also, to keep you safe, please use the provided hand sanitizer when you enter the building. (Insert pt name), we are asking everyone in the building to wear a mask because they help [name] prevent the spread of germs. Do you have a mask of your own, if not, we are happy to provide one for you. The last thing I want to go over with you is the no visitor guidelines. This means no one can attend the appointment with you unless you need physical assistance. I understand this may be different from your past appointments and I know this may be difficult but please  know if someone is driving you we are happy to call them for you once your appointment is over.  [INSERT [place]  (Insert pt name) I've given you a lot of information, what questions do you have about what I've talked about today or your appointment tomorrow? Benjie Karvonen, CMA

## 2018-09-11 NOTE — Telephone Encounter (Signed)
[place] with me for wife to accompany patient

## 2018-09-12 ENCOUNTER — Ambulatory Visit (INDEPENDENT_AMBULATORY_CARE_PROVIDER_SITE_OTHER)
Admission: RE | Admit: 2018-09-12 | Discharge: 2018-09-12 | Disposition: A | Discharge status: Home / Self Care | Payer: Medicare Other | Source: Ambulatory Visit | Attending: Pulmonary Disease | Admitting: Pulmonary Disease

## 2018-09-12 ENCOUNTER — Other Ambulatory Visit: Payer: Self-pay

## 2018-09-12 ENCOUNTER — Inpatient Hospital Stay: Admission: RE | Admit: 2018-09-12 | Payer: Medicare Other | Source: Ambulatory Visit

## 2018-09-12 DIAGNOSIS — I700 Atherosclerosis of aorta: Secondary | ICD-10-CM | POA: Diagnosis not present

## 2018-09-12 DIAGNOSIS — J449 Chronic obstructive pulmonary disease, unspecified: Secondary | ICD-10-CM | POA: Diagnosis not present

## 2018-09-12 DIAGNOSIS — I2510 Atherosclerotic heart disease of native coronary artery without angina pectoris: Secondary | ICD-10-CM | POA: Diagnosis not present

## 2018-09-12 DIAGNOSIS — J439 Emphysema, unspecified: Secondary | ICD-10-CM | POA: Diagnosis not present

## 2018-09-12 DIAGNOSIS — J181 Lobar pneumonia, unspecified organism: Secondary | ICD-10-CM | POA: Diagnosis not present

## 2018-09-13 ENCOUNTER — Other Ambulatory Visit: Payer: Self-pay | Admitting: Rheumatology

## 2018-09-14 ENCOUNTER — Telehealth: Payer: Self-pay | Admitting: Internal Medicine

## 2018-09-14 MED ORDER — NYSTATIN 100000 UNIT/ML MT SUSP: 5.0000 mL | Freq: Four times a day (QID) | OROMUCOSAL | 0 refills | Status: DC

## 2018-09-14 NOTE — Telephone Encounter (Signed)
Only got 3 days of nystatin, daughter thought was supposed to be 7-10 days for presumed thrush.  Will give 6 more days worth and told to call back early week if still having issues with pain and funny taste in mouth so we can eval in person.  Erskine Emery MD

## 2018-09-16 ENCOUNTER — Other Ambulatory Visit: Payer: Self-pay

## 2018-09-16 ENCOUNTER — Emergency Department (HOSPITAL_COMMUNITY): Payer: Medicare Other

## 2018-09-16 ENCOUNTER — Encounter (HOSPITAL_COMMUNITY): Payer: Self-pay | Admitting: *Deleted

## 2018-09-16 ENCOUNTER — Emergency Department (HOSPITAL_COMMUNITY)
Admission: EM | Admit: 2018-09-16 | Discharge: 2018-09-17 | Disposition: A | Discharge status: Home / Self Care | Payer: Medicare Other | Source: Home / Self Care | Attending: Emergency Medicine | Admitting: Emergency Medicine

## 2018-09-16 DIAGNOSIS — J189 Pneumonia, unspecified organism: Secondary | ICD-10-CM

## 2018-09-16 DIAGNOSIS — J188 Other pneumonia, unspecified organism: Secondary | ICD-10-CM | POA: Diagnosis not present

## 2018-09-16 DIAGNOSIS — J449 Chronic obstructive pulmonary disease, unspecified: Secondary | ICD-10-CM | POA: Insufficient documentation

## 2018-09-16 DIAGNOSIS — E230 Hypopituitarism: Secondary | ICD-10-CM | POA: Diagnosis not present

## 2018-09-16 DIAGNOSIS — A419 Sepsis, unspecified organism: Secondary | ICD-10-CM | POA: Diagnosis not present

## 2018-09-16 DIAGNOSIS — R6520 Severe sepsis without septic shock: Secondary | ICD-10-CM | POA: Diagnosis not present

## 2018-09-16 DIAGNOSIS — E860 Dehydration: Secondary | ICD-10-CM | POA: Insufficient documentation

## 2018-09-16 DIAGNOSIS — Z87891 Personal history of nicotine dependence: Secondary | ICD-10-CM | POA: Insufficient documentation

## 2018-09-16 DIAGNOSIS — J440 Chronic obstructive pulmonary disease with acute lower respiratory infection: Secondary | ICD-10-CM | POA: Diagnosis not present

## 2018-09-16 DIAGNOSIS — J9601 Acute respiratory failure with hypoxia: Secondary | ICD-10-CM | POA: Diagnosis not present

## 2018-09-16 DIAGNOSIS — Z20828 Contact with and (suspected) exposure to other viral communicable diseases: Secondary | ICD-10-CM | POA: Diagnosis not present

## 2018-09-16 DIAGNOSIS — Z79899 Other long term (current) drug therapy: Secondary | ICD-10-CM | POA: Insufficient documentation

## 2018-09-16 DIAGNOSIS — R0602 Shortness of breath: Secondary | ICD-10-CM | POA: Diagnosis not present

## 2018-09-16 DIAGNOSIS — J181 Lobar pneumonia, unspecified organism: Secondary | ICD-10-CM | POA: Insufficient documentation

## 2018-09-16 HISTORY — DX: Chronic obstructive pulmonary disease, unspecified: J44.9

## 2018-09-16 LAB — SARS CORONAVIRUS 2 BY RT PCR (HOSPITAL ORDER, PERFORMED IN ~~LOC~~ HOSPITAL LAB): SARS Coronavirus 2: NEGATIVE

## 2018-09-16 LAB — CBC WITH DIFFERENTIAL/PLATELET
Abs Immature Granulocytes: 0.33 10*3/uL — ABNORMAL HIGH (ref 0.00–0.07)
Basophils Absolute: 0.1 10*3/uL (ref 0.0–0.1)
Basophils Relative: 0 %
Eosinophils Absolute: 0.1 10*3/uL (ref 0.0–0.5)
Eosinophils Relative: 1 %
HCT: 39.7 % (ref 39.0–52.0)
Hemoglobin: 12.8 g/dL — ABNORMAL LOW (ref 13.0–17.0)
Immature Granulocytes: 2 %
Lymphocytes Relative: 6 %
Lymphs Abs: 1.1 10*3/uL (ref 0.7–4.0)
MCH: 30.5 pg (ref 26.0–34.0)
MCHC: 32.2 g/dL (ref 30.0–36.0)
MCV: 94.7 fL (ref 80.0–100.0)
Monocytes Absolute: 0.4 10*3/uL (ref 0.1–1.0)
Monocytes Relative: 2 %
Neutro Abs: 17.4 10*3/uL — ABNORMAL HIGH (ref 1.7–7.7)
Neutrophils Relative %: 89 %
Platelets: 568 10*3/uL — ABNORMAL HIGH (ref 150–400)
RBC: 4.19 MIL/uL — ABNORMAL LOW (ref 4.22–5.81)
RDW: 15.9 % — ABNORMAL HIGH (ref 11.5–15.5)
WBC: 19.3 10*3/uL — ABNORMAL HIGH (ref 4.0–10.5)
nRBC: 0 % (ref 0.0–0.2)

## 2018-09-16 LAB — COMPREHENSIVE METABOLIC PANEL
ALT: 17 U/L (ref 0–44)
AST: 30 U/L (ref 15–41)
Albumin: 2 g/dL — ABNORMAL LOW (ref 3.5–5.0)
Alkaline Phosphatase: 104 U/L (ref 38–126)
Anion gap: 13 (ref 5–15)
BUN: 25 mg/dL — ABNORMAL HIGH (ref 8–23)
CO2: 25 mmol/L (ref 22–32)
Calcium: 9 mg/dL (ref 8.9–10.3)
Chloride: 93 mmol/L — ABNORMAL LOW (ref 98–111)
Creatinine, Ser: 1.26 mg/dL — ABNORMAL HIGH (ref 0.61–1.24)
GFR calc Af Amer: >60 mL/min (ref >60)
GFR calc non Af Amer: 52 mL/min — ABNORMAL LOW (ref >60)
Glucose, Bld: 142 mg/dL — ABNORMAL HIGH (ref 70–99)
Potassium: 3.9 mmol/L (ref 3.5–5.1)
Sodium: 131 mmol/L — ABNORMAL LOW (ref 135–145)
Total Bilirubin: 0.9 mg/dL (ref 0.3–1.2)
Total Protein: 6.4 g/dL — ABNORMAL LOW (ref 6.5–8.1)

## 2018-09-16 LAB — BRAIN NATRIURETIC PEPTIDE: B Natriuretic Peptide: 368.4 pg/mL — ABNORMAL HIGH (ref 0.0–100.0)

## 2018-09-16 MED ORDER — LEVOFLOXACIN IN D5W 500 MG/100ML IV SOLN
500.0000 mg | Freq: Once | INTRAVENOUS | Status: AC
  Administered 2018-09-16: 500 mg via INTRAVENOUS
  Filled 2018-09-16: qty 100

## 2018-09-16 MED ORDER — LEVOFLOXACIN 750 MG PO TABS: 750.0000 mg | ORAL_TABLET | Freq: Every day | ORAL | 0 refills | Status: DC

## 2018-09-16 MED ORDER — SODIUM CHLORIDE 0.9 % IV BOLUS
1000.0000 mL | Freq: Once | INTRAVENOUS | Status: AC
  Administered 2018-09-16: 1000 mL via INTRAVENOUS

## 2018-09-16 NOTE — ED Notes (Signed)
ABX infusing, d/c after completion

## 2018-09-16 NOTE — ED Notes (Signed)
ED Provider at bedside. 

## 2018-09-16 NOTE — ED Provider Notes (Signed)
[facility] EMERGENCY DEPARTMENT Provider Note   CSN: [id number] Arrival date & time: 09/16/18  2041    History   Chief Complaint Chief Complaint  Patient presents with  . Dehydration    HPI Ryan Coffey is a 85 y.o. male.     HPI Patient with a history of Parkinson's disease, COPD presents with weakness.  Patient notes that about 3 weeks ago he was started on a new inhaled pulmonary medication regimen. He notes that not long after he developed cough, congestion, sore throat.  No chest pain, no abdominal pain, no vomiting. No fever, no chills. However, with his ongoing discomfort, he has had increasing fatigue, decreased oral intake. No other changes in medication, diet, activity. Patient notes that he is not on home oxygen, but typically has saturation of 90/91%, attributed to his COPD.  Past Medical History:  Diagnosis Date  . Arthritis   . Benign tumor of pituitary gland ([place])    "wasn't able to get it all"  . COPD (chronic obstructive pulmonary disease) ([place])   . Pneumonia 06/27/11   "first time"  . Shingles     Patient Active Problem List   Diagnosis Date Noted  . Other emphysema ([place]) 08/29/2018  . Unintentional weight loss 06/18/2018  . Chronic cough 06/18/2018  . COPD (chronic obstructive pulmonary disease) ([place]) 08/29/2016  . Rheumatoid nodulosis ([place]) 08/24/2016  . High risk medication use 08/24/2016  . Thrombocytosis ([place]) 08/24/2016  . Elevated LFTs mild elevation of ALT  08/24/2016  . History of renal cell carcinoma 08/24/2016  . History of prostate cancer 08/24/2016  . History of lung cancer 08/24/2016  . History of adrenal insufficiency 08/24/2016  . HCAP (healthcare-associated pneumonia) 06/27/2011    Class: Acute  . Seropositive rheumatoid arthritis ([place]) 06/27/2011  . Panhypopituitarism ([place]) 06/27/2011  . Degenerative joint disease 06/27/2011  . History of tobacco use 06/27/2011  . Hyperlipidemia 06/27/2011    Past  Surgical History:  Procedure Laterality Date  . CHOLECYSTECTOMY OPEN  2010  . KIDNEY SURGERY  2011   "lesion removed right side"  . RENAL BIOPSY  2012   right  . TRANSPHENOIDAL / TRANSNASAL HYPOPHYSECTOMY / RESECTION PITUITARY TUMOR  ~ 1979   "they couldn't get it all"        Home Medications    Prior to Admission medications   Medication Sig Start Date End Date Taking? Authorizing Provider  acetaminophen (TYLENOL) 325 MG tablet Take 650 mg by mouth 2 (two) times daily as needed (arthritis).    [provider]  albuterol (PROVENTIL HFA;VENTOLIN HFA) 108 (90 Base) MCG/ACT inhaler Inhale 2 puffs into the lungs every 6 (six) hours as needed for wheezing or shortness of breath. 05/20/18   Shelly Coss, MD  bromocriptine (PARLODEL) 2.5 MG tablet Take 2.5 mg by mouth 2 (two) times daily.    [provider]  budesonide-formoterol (SYMBICORT) 80-4.5 MCG/ACT inhaler Inhale 2 puffs into the lungs 2 (two) times a day. 09/11/18   Fenton Foy, NP  Calcium Carbonate-Vitamin D (CALCIUM 500 + D) 500-125 MG-UNIT TABS Take 1 tablet by mouth 2 (two) times daily.    [provider]  Fluticasone-Umeclidin-Vilant (TRELEGY ELLIPTA) 100-62.5-25 MCG/INH AEPB Inhale 1 puff into the lungs daily. 08/29/18   Mannam, Hart Robinsons, MD  Fluticasone-Umeclidin-Vilant (TRELEGY ELLIPTA) 100-62.5-25 MCG/INH AEPB Inhale 1 puff into the lungs daily. 08/29/18   Mannam, Hart Robinsons, MD  folic acid (FOLVITE) 1 MG tablet TAKE 2 TABLETS EVERY DAY 08/04/18   Deveshwar,  Abel Presto, MD  guaiFENesin-dextromethorphan (ROBITUSSIN DM) 100-10 MG/5ML syrup Take 10 mLs by mouth every 4 (four) hours as needed for cough. 05/20/18   Shelly Coss, MD  hydrocortisone (CORTEF) 20 MG tablet Take 10-20 mg by mouth daily. 20 mg in the morning and 10mg  at night 11/02/16   [provider]  levofloxacin (LEVAQUIN) 750 MG tablet Take 1 tablet (750 mg total) by mouth daily. 05/21/18   Shelly Coss, MD  levothyroxine (SYNTHROID,  LEVOTHROID) 137 MCG tablet Take 137 mcg by mouth daily.      [provider]  methotrexate (RHEUMATREX) 2.5 MG tablet TAKE 4 TABLETS ON SATURDAY AND 4 TABLETS ON SUNDAY. CAUTION:CHEMOTHERAPY. PROTECT FROM LIGHT. 09/10/18   Bo Merino, MD  Multiple Vitamins-Minerals (CENTRUM SILVER PO) Take 1 tablet by mouth daily.     [provider]  nystatin (MYCOSTATIN) 100000 UNIT/ML suspension Take 5 mLs (500,000 Units total) by mouth 4 (four) times daily for 6 days. 09/14/18 09/20/18  Candee Furbish, MD  pantoprazole (PROTONIX) 40 MG tablet Take 40 mg by mouth 2 (two) times daily.    [provider]  pravastatin (PRAVACHOL) 80 MG tablet Take 80 mg by mouth daily.    [provider]  testosterone cypionate (DEPOTESTOSTERONE CYPIONATE) 200 MG/ML injection Inject 75 mg into the muscle every 14 (fourteen) days.  05/24/16   [provider]  zolpidem (AMBIEN CR) 6.25 MG CR tablet Take 1 tablet (6.25 mg total) by mouth at bedtime as needed for sleep. 05/20/18   Shelly Coss, MD    Family History Family History  Problem Relation Age of Onset  . Heart disease Mother   . Cancer Brother   . Heart disease Brother     Social History Social History   Tobacco Use  . Smoking status: Former Smoker    Packs/day: 0.50    Years: 10.00    Pack years: 5.00    Types: Cigarettes    Quit date: 08/03/1980    Years since quitting: 38.1  . Smokeless tobacco: Never Used  Substance Use Topics  . Alcohol use: Yes    Alcohol/week: 3.0 standard drinks    Types: 3 Glasses of wine per week  . Drug use: No     Allergies   Patient has no known allergies.   Review of Systems Review of Systems  Constitutional:       Per HPI, otherwise negative  HENT:       Per HPI, otherwise negative  Respiratory:       Per HPI, otherwise negative  Cardiovascular:       Per HPI, otherwise negative  Gastrointestinal: Negative for vomiting.  Endocrine:       Negative aside from HPI   Genitourinary:       Neg aside from HPI   Musculoskeletal:       Per HPI, otherwise negative  Skin: Negative.   Allergic/Immunologic: Positive for immunocompromised state.       On methotrexate  Neurological: Positive for weakness. Negative for syncope.     Physical Exam Updated Vital Signs BP 100/80   Pulse 89   Temp 98.4 F (36.9 C) (Oral)   Resp (!) 27   Ht 5\' 8"  (1.727 m)   Wt 72.6 kg   SpO2 94%   BMI 24.33 kg/m   Physical Exam Vitals signs and nursing note reviewed.  Constitutional:      General: He is not in acute distress.    Appearance: He is well-developed.  HENT:  Head: Normocephalic and atraumatic.     Mouth/Throat:   Eyes:     Conjunctiva/sclera: Conjunctivae normal.  Cardiovascular:     Rate and Rhythm: Normal rate and regular rhythm.  Pulmonary:     Breath sounds: Decreased air movement present. Decreased breath sounds present. No wheezing.  Abdominal:     General: There is no distension.  Skin:    General: Skin is warm and dry.  Neurological:     Mental Status: He is alert and oriented to person, place, and time.      ED Treatments / Results  Labs (all labs ordered are listed, but only abnormal results are displayed) Labs Reviewed  COMPREHENSIVE METABOLIC PANEL - Abnormal; Notable for the following components:      Result Value   Sodium 131 (*)    Chloride 93 (*)    Glucose, Bld 142 (*)    BUN 25 (*)    Creatinine, Ser 1.26 (*)    Total Protein 6.4 (*)    Albumin 2.0 (*)    GFR calc non Af Amer 52 (*)    All other components within normal limits  CBC WITH DIFFERENTIAL/PLATELET - Abnormal; Notable for the following components:   WBC 19.3 (*)    RBC 4.19 (*)    Hemoglobin 12.8 (*)    RDW 15.9 (*)    Platelets 568 (*)    Neutro Abs 17.4 (*)    Abs Immature Granulocytes 0.33 (*)    All other components within normal limits  BRAIN NATRIURETIC PEPTIDE - Abnormal; Notable for the following components:   B Natriuretic Peptide 368.4  (*)    All other components within normal limits  SARS CORONAVIRUS 2 (HOSPITAL ORDER, [facility])  URINALYSIS, ROUTINE W REFLEX MICROSCOPIC    EKG EKG Interpretation  Date/Time:  Tuesday September 16 2018 21:04:12 EDT Ventricular Rate:  100 PR Interval:    QRS Duration: 83 QT Interval:  338 QTC Calculation: 436 R Axis:   80 Text Interpretation:  Sinus tachycardia Borderline T wave abnormalities Baseline wander in lead(s) V5 Abnormal ECG Confirmed by Carmin Muskrat [phone]) on 09/16/2018 10:10:23 PM   Radiology Dg Chest Port 1 View  Result Date: 09/16/2018 CLINICAL DATA:  Shortness of breath EXAM: PORTABLE CHEST 1 VIEW COMPARISON:  September 12, 2018.  Chest x-ray 05/16/2018 FINDINGS: Consolidation in the right mid and lower lung compatible with pneumonia. Left lung clear. Heart is normal size. No effusions or acute bony abnormality. IMPRESSION: Dense consolidation in the right mid and lower lung as seen on prior CT compatible with pneumonia. Electronically Signed   By: Rolm Baptise M.D.   On: 09/16/2018 22:14    Procedures Procedures (including critical care time)  Medications Ordered in ED Medications  levofloxacin (LEVAQUIN) IVPB 500 mg (has no administration in time range)  sodium chloride 0.9 % bolus 1,000 mL (0 mLs Intravenous Stopped 09/16/18 2255)     Initial Impression / Assessment and Plan / ED Course  I have reviewed the triage vital signs and the nursing notes.  Pertinent labs & imaging results that were available during my care of the patient were reviewed by me and considered in my medical decision making (see chart for details).    Initially patient found to be hypoxic, beyond baseline, with a saturation on room air of 87, 88%. This improved with nasal cannula.    I reviewed the patient's chart including CT scan from a few days ago, notable for right sided  opacification superiorly. Notably, the patient is not currently on antibiotics.    11:14 PM Repeat exam the patient remains awake and alert. We lengthy conversation about today's findings including concern from CT scan 2 days ago, x-ray abnormal today, concern for pneumonia versus inflammation. Patient amenable to starting antibiotics, but adamant about discharge, states that he wants to follow-up with his pulmonologist tomorrow. I counseled him on the wisdom of staying in the hospital, particularly given the patient's slight decrease in oxygenation from baseline of already low values, 9091 now 87, 88%. Patient was found to have dehydration, another indication for admission, but again the patient was adamant about discharge, states that he wants to see his pulmonologist tomorrow. Patient has capacity to make this recommendation, but was encouraged to return for any concerning changes, otherwise be sure to follow-up with his physician. This elderly male with a history of COPD, baseline mild hypoxia presents with fatigue, is found to have elevated creatinine, right-sided opacification concerning for pneumonia, and given his commendation, comorbidities, indication was for admission, but the patient deferred this recommendation. Patient will follow-up closely, tomorrow morning with his physician.  Final Clinical Impressions(s) / ED Diagnoses   Final diagnoses:  Community acquired pneumonia of right upper lobe of lung ([place])  Dehydration    ED Discharge Orders         Ordered    levofloxacin (LEVAQUIN) 750 MG tablet  Daily     09/16/18 2317           Carmin Muskrat, MD 09/16/18 2317

## 2018-09-16 NOTE — ED Triage Notes (Signed)
Pt says that he was started on an inhaler about 2 weeks ago and since then he has not been able to eat. Pt has congested cough, says it just became worse tonight. Pt was 83% on room air in triage.

## 2018-09-16 NOTE — Discharge Instructions (Signed)
As discussed, it is very important that you follow-up with your physician tomorrow. Do not hesitate to return here for any concerning changes at all.

## 2018-09-17 ENCOUNTER — Other Ambulatory Visit: Payer: Medicare Other

## 2018-09-18 ENCOUNTER — Encounter (HOSPITAL_COMMUNITY): Payer: Self-pay

## 2018-09-18 ENCOUNTER — Emergency Department (HOSPITAL_COMMUNITY): Payer: Medicare Other

## 2018-09-18 ENCOUNTER — Other Ambulatory Visit: Payer: Self-pay

## 2018-09-18 ENCOUNTER — Inpatient Hospital Stay (HOSPITAL_COMMUNITY)
Admission: EM | Admit: 2018-09-18 | Discharge: 2018-09-25 | DRG: 871 | Disposition: A | Discharge status: Home Health | Payer: Medicare Other | Attending: Family Medicine | Admitting: Family Medicine

## 2018-09-18 DIAGNOSIS — A419 Sepsis, unspecified organism: Secondary | ICD-10-CM | POA: Diagnosis not present

## 2018-09-18 DIAGNOSIS — Z85118 Personal history of other malignant neoplasm of bronchus and lung: Secondary | ICD-10-CM | POA: Diagnosis not present

## 2018-09-18 DIAGNOSIS — Z8546 Personal history of malignant neoplasm of prostate: Secondary | ICD-10-CM | POA: Diagnosis not present

## 2018-09-18 DIAGNOSIS — K140 Glossitis: Secondary | ICD-10-CM | POA: Diagnosis present

## 2018-09-18 DIAGNOSIS — Z87891 Personal history of nicotine dependence: Secondary | ICD-10-CM

## 2018-09-18 DIAGNOSIS — D352 Benign neoplasm of pituitary gland: Secondary | ICD-10-CM | POA: Diagnosis present

## 2018-09-18 DIAGNOSIS — L8990 Pressure ulcer of unspecified site, unspecified stage: Secondary | ICD-10-CM | POA: Diagnosis present

## 2018-09-18 DIAGNOSIS — J9601 Acute respiratory failure with hypoxia: Secondary | ICD-10-CM | POA: Diagnosis present

## 2018-09-18 DIAGNOSIS — D849 Immunodeficiency, unspecified: Secondary | ICD-10-CM | POA: Diagnosis present

## 2018-09-18 DIAGNOSIS — R6520 Severe sepsis without septic shock: Secondary | ICD-10-CM | POA: Diagnosis not present

## 2018-09-18 DIAGNOSIS — Z7951 Long term (current) use of inhaled steroids: Secondary | ICD-10-CM | POA: Diagnosis not present

## 2018-09-18 DIAGNOSIS — I5032 Chronic diastolic (congestive) heart failure: Secondary | ICD-10-CM | POA: Diagnosis present

## 2018-09-18 DIAGNOSIS — E8809 Other disorders of plasma-protein metabolism, not elsewhere classified: Secondary | ICD-10-CM | POA: Diagnosis present

## 2018-09-18 DIAGNOSIS — Z79890 Hormone replacement therapy (postmenopausal): Secondary | ICD-10-CM | POA: Diagnosis not present

## 2018-09-18 DIAGNOSIS — J9690 Respiratory failure, unspecified, unspecified whether with hypoxia or hypercapnia: Secondary | ICD-10-CM | POA: Diagnosis not present

## 2018-09-18 DIAGNOSIS — Z7952 Long term (current) use of systemic steroids: Secondary | ICD-10-CM | POA: Diagnosis not present

## 2018-09-18 DIAGNOSIS — J984 Other disorders of lung: Secondary | ICD-10-CM | POA: Diagnosis not present

## 2018-09-18 DIAGNOSIS — J188 Other pneumonia, unspecified organism: Secondary | ICD-10-CM | POA: Diagnosis present

## 2018-09-18 DIAGNOSIS — E785 Hyperlipidemia, unspecified: Secondary | ICD-10-CM | POA: Diagnosis present

## 2018-09-18 DIAGNOSIS — J189 Pneumonia, unspecified organism: Secondary | ICD-10-CM

## 2018-09-18 DIAGNOSIS — Z20828 Contact with and (suspected) exposure to other viral communicable diseases: Secondary | ICD-10-CM | POA: Diagnosis present

## 2018-09-18 DIAGNOSIS — Z79899 Other long term (current) drug therapy: Secondary | ICD-10-CM | POA: Diagnosis not present

## 2018-09-18 DIAGNOSIS — Z85528 Personal history of other malignant neoplasm of kidney: Secondary | ICD-10-CM | POA: Diagnosis not present

## 2018-09-18 DIAGNOSIS — D72829 Elevated white blood cell count, unspecified: Secondary | ICD-10-CM | POA: Diagnosis not present

## 2018-09-18 DIAGNOSIS — Z66 Do not resuscitate: Secondary | ICD-10-CM | POA: Diagnosis present

## 2018-09-18 DIAGNOSIS — Y95 Nosocomial condition: Secondary | ICD-10-CM | POA: Diagnosis not present

## 2018-09-18 DIAGNOSIS — Z8701 Personal history of pneumonia (recurrent): Secondary | ICD-10-CM | POA: Diagnosis not present

## 2018-09-18 DIAGNOSIS — R0689 Other abnormalities of breathing: Secondary | ICD-10-CM | POA: Diagnosis not present

## 2018-09-18 DIAGNOSIS — E039 Hypothyroidism, unspecified: Secondary | ICD-10-CM | POA: Diagnosis present

## 2018-09-18 DIAGNOSIS — E860 Dehydration: Secondary | ICD-10-CM | POA: Diagnosis present

## 2018-09-18 DIAGNOSIS — E230 Hypopituitarism: Secondary | ICD-10-CM | POA: Diagnosis not present

## 2018-09-18 DIAGNOSIS — G20 Parkinson's disease: Secondary | ICD-10-CM | POA: Diagnosis present

## 2018-09-18 DIAGNOSIS — R0902 Hypoxemia: Secondary | ICD-10-CM | POA: Diagnosis not present

## 2018-09-18 DIAGNOSIS — J90 Pleural effusion, not elsewhere classified: Secondary | ICD-10-CM | POA: Diagnosis not present

## 2018-09-18 DIAGNOSIS — D899 Disorder involving the immune mechanism, unspecified: Secondary | ICD-10-CM | POA: Diagnosis not present

## 2018-09-18 DIAGNOSIS — J449 Chronic obstructive pulmonary disease, unspecified: Secondary | ICD-10-CM

## 2018-09-18 DIAGNOSIS — Z9049 Acquired absence of other specified parts of digestive tract: Secondary | ICD-10-CM | POA: Diagnosis not present

## 2018-09-18 DIAGNOSIS — R069 Unspecified abnormalities of breathing: Secondary | ICD-10-CM | POA: Diagnosis not present

## 2018-09-18 DIAGNOSIS — R0602 Shortness of breath: Secondary | ICD-10-CM | POA: Diagnosis not present

## 2018-09-18 DIAGNOSIS — L89151 Pressure ulcer of sacral region, stage 1: Secondary | ICD-10-CM | POA: Diagnosis present

## 2018-09-18 DIAGNOSIS — E876 Hypokalemia: Secondary | ICD-10-CM | POA: Diagnosis present

## 2018-09-18 DIAGNOSIS — J440 Chronic obstructive pulmonary disease with acute lower respiratory infection: Secondary | ICD-10-CM | POA: Diagnosis present

## 2018-09-18 DIAGNOSIS — M059 Rheumatoid arthritis with rheumatoid factor, unspecified: Secondary | ICD-10-CM | POA: Diagnosis present

## 2018-09-18 DIAGNOSIS — R0600 Dyspnea, unspecified: Secondary | ICD-10-CM

## 2018-09-18 DIAGNOSIS — J439 Emphysema, unspecified: Secondary | ICD-10-CM | POA: Diagnosis not present

## 2018-09-18 DIAGNOSIS — Z209 Contact with and (suspected) exposure to unspecified communicable disease: Secondary | ICD-10-CM | POA: Diagnosis not present

## 2018-09-18 DIAGNOSIS — I361 Nonrheumatic tricuspid (valve) insufficiency: Secondary | ICD-10-CM | POA: Diagnosis not present

## 2018-09-18 LAB — CBC WITH DIFFERENTIAL/PLATELET
Abs Immature Granulocytes: 0.24 10*3/uL — ABNORMAL HIGH (ref 0.00–0.07)
Basophils Absolute: 0 10*3/uL (ref 0.0–0.1)
Basophils Relative: 0 %
Eosinophils Absolute: 0 10*3/uL (ref 0.0–0.5)
Eosinophils Relative: 0 %
HCT: 35 % — ABNORMAL LOW (ref 39.0–52.0)
Hemoglobin: 11.4 g/dL — ABNORMAL LOW (ref 13.0–17.0)
Immature Granulocytes: 1 %
Lymphocytes Relative: 4 %
Lymphs Abs: 0.9 10*3/uL (ref 0.7–4.0)
MCH: 30.2 pg (ref 26.0–34.0)
MCHC: 32.6 g/dL (ref 30.0–36.0)
MCV: 92.8 fL (ref 80.0–100.0)
Monocytes Absolute: 0.5 10*3/uL (ref 0.1–1.0)
Monocytes Relative: 3 %
Neutro Abs: 18.8 10*3/uL — ABNORMAL HIGH (ref 1.7–7.7)
Neutrophils Relative %: 92 %
Platelets: 420 10*3/uL — ABNORMAL HIGH (ref 150–400)
RBC: 3.77 MIL/uL — ABNORMAL LOW (ref 4.22–5.81)
RDW: 15.9 % — ABNORMAL HIGH (ref 11.5–15.5)
WBC: 20.5 10*3/uL — ABNORMAL HIGH (ref 4.0–10.5)
nRBC: 0 % (ref 0.0–0.2)

## 2018-09-18 LAB — COMPREHENSIVE METABOLIC PANEL
ALT: 17 U/L (ref 0–44)
AST: 43 U/L — ABNORMAL HIGH (ref 15–41)
Albumin: 1.7 g/dL — ABNORMAL LOW (ref 3.5–5.0)
Alkaline Phosphatase: 83 U/L (ref 38–126)
Anion gap: 13 (ref 5–15)
BUN: 18 mg/dL (ref 8–23)
CO2: 24 mmol/L (ref 22–32)
Calcium: 8.4 mg/dL — ABNORMAL LOW (ref 8.9–10.3)
Chloride: 96 mmol/L — ABNORMAL LOW (ref 98–111)
Creatinine, Ser: 0.91 mg/dL (ref 0.61–1.24)
GFR calc Af Amer: >60 mL/min (ref >60)
GFR calc non Af Amer: >60 mL/min (ref >60)
Glucose, Bld: 94 mg/dL (ref 70–99)
Potassium: 3.9 mmol/L (ref 3.5–5.1)
Sodium: 133 mmol/L — ABNORMAL LOW (ref 135–145)
Total Bilirubin: 1.1 mg/dL (ref 0.3–1.2)
Total Protein: 5.3 g/dL — ABNORMAL LOW (ref 6.5–8.1)

## 2018-09-18 LAB — APTT: aPTT: 31 seconds (ref 24–36)

## 2018-09-18 LAB — LACTIC ACID, PLASMA
Lactic Acid, Venous: 1.5 mmol/L (ref 0.5–1.9)
Lactic Acid, Venous: 1.1 mmol/L (ref 0.5–1.9)

## 2018-09-18 LAB — CREATININE, SERUM
Creatinine, Ser: 0.93 mg/dL (ref 0.61–1.24)
GFR calc Af Amer: >60 mL/min (ref >60)
GFR calc non Af Amer: >60 mL/min (ref >60)

## 2018-09-18 LAB — CBC
HCT: 34.8 % — ABNORMAL LOW (ref 39.0–52.0)
Hemoglobin: 11.3 g/dL — ABNORMAL LOW (ref 13.0–17.0)
MCH: 30.5 pg (ref 26.0–34.0)
MCHC: 32.5 g/dL (ref 30.0–36.0)
MCV: 93.8 fL (ref 80.0–100.0)
Platelets: 377 10*3/uL (ref 150–400)
RBC: 3.71 MIL/uL — ABNORMAL LOW (ref 4.22–5.81)
RDW: 15.9 % — ABNORMAL HIGH (ref 11.5–15.5)
WBC: 21.9 10*3/uL — ABNORMAL HIGH (ref 4.0–10.5)
nRBC: 0 % (ref 0.0–0.2)

## 2018-09-18 LAB — SARS CORONAVIRUS 2 BY RT PCR (HOSPITAL ORDER, PERFORMED IN ~~LOC~~ HOSPITAL LAB): SARS Coronavirus 2: NEGATIVE

## 2018-09-18 LAB — PROTIME-INR
INR: 1.2 (ref 0.8–1.2)
Prothrombin Time: 15.1 seconds (ref 11.4–15.2)

## 2018-09-18 MED ORDER — LACTATED RINGERS IV BOLUS (SEPSIS)
1000.0000 mL | Freq: Once | INTRAVENOUS | Status: AC
  Administered 2018-09-18: 1000 mL via INTRAVENOUS

## 2018-09-18 MED ORDER — SODIUM CHLORIDE 0.9 % IV SOLN
2.0000 g | Freq: Two times a day (BID) | INTRAVENOUS | Status: DC
  Administered 2018-09-18 – 2018-09-22 (×7): 2 g via INTRAVENOUS
  Filled 2018-09-18 (×8): qty 2

## 2018-09-18 MED ORDER — ENOXAPARIN SODIUM 40 MG/0.4ML ~~LOC~~ SOLN
40.0000 mg | SUBCUTANEOUS | Status: DC
  Administered 2018-09-18 – 2018-09-24 (×7): 40 mg via SUBCUTANEOUS
  Filled 2018-09-18 (×7): qty 0.4

## 2018-09-18 MED ORDER — SODIUM CHLORIDE 0.9 % IV SOLN
500.0000 mg | INTRAVENOUS | Status: DC
  Administered 2018-09-18 – 2018-09-21 (×4): 500 mg via INTRAVENOUS
  Filled 2018-09-18 (×5): qty 500

## 2018-09-18 MED ORDER — LEVOTHYROXINE SODIUM 25 MCG PO TABS
137.0000 ug | ORAL_TABLET | Freq: Every day | ORAL | Status: DC
  Administered 2018-09-19 – 2018-09-25 (×7): 137 ug via ORAL
  Filled 2018-09-18 (×7): qty 1

## 2018-09-18 MED ORDER — ACETAMINOPHEN 500 MG PO TABS
1000.0000 mg | ORAL_TABLET | Freq: Once | ORAL | Status: AC
  Administered 2018-09-18: 13:00:00 1000 mg via ORAL
  Filled 2018-09-18: qty 2

## 2018-09-18 MED ORDER — METHOTREXATE 2.5 MG PO TABS
10.0000 mg | ORAL_TABLET | ORAL | Status: DC
  Administered 2018-09-20 – 2018-09-21 (×2): 10 mg via ORAL
  Filled 2018-09-18 (×3): qty 4

## 2018-09-18 MED ORDER — BROMOCRIPTINE MESYLATE 2.5 MG PO TABS
2.5000 mg | ORAL_TABLET | Freq: Two times a day (BID) | ORAL | Status: DC
  Administered 2018-09-18 – 2018-09-25 (×14): 2.5 mg via ORAL
  Filled 2018-09-18 (×15): qty 1

## 2018-09-18 MED ORDER — FOLIC ACID 1 MG PO TABS
2.0000 mg | ORAL_TABLET | Freq: Every day | ORAL | Status: DC
  Administered 2018-09-18 – 2018-09-25 (×8): 2 mg via ORAL
  Filled 2018-09-18 (×9): qty 2

## 2018-09-18 MED ORDER — PANTOPRAZOLE SODIUM 40 MG PO TBEC
40.0000 mg | DELAYED_RELEASE_TABLET | Freq: Two times a day (BID) | ORAL | Status: DC
  Administered 2018-09-18 – 2018-09-25 (×14): 40 mg via ORAL
  Filled 2018-09-18 (×14): qty 1

## 2018-09-18 MED ORDER — SODIUM CHLORIDE 0.9 % IV SOLN
2.0000 g | Freq: Once | INTRAVENOUS | Status: AC
  Administered 2018-09-18: 13:00:00 2 g via INTRAVENOUS
  Filled 2018-09-18: qty 2

## 2018-09-18 MED ORDER — VANCOMYCIN HCL 10 G IV SOLR
1250.0000 mg | Freq: Once | INTRAVENOUS | Status: AC
  Administered 2018-09-18: 1250 mg via INTRAVENOUS
  Filled 2018-09-18: qty 1250

## 2018-09-18 MED ORDER — LACTATED RINGERS IV SOLN
INTRAVENOUS | Status: AC
  Administered 2018-09-18 (×2): via INTRAVENOUS

## 2018-09-18 MED ORDER — HYDROCORTISONE NA SUCCINATE PF 100 MG IJ SOLR
50.0000 mg | Freq: Four times a day (QID) | INTRAMUSCULAR | Status: DC
  Administered 2018-09-18 – 2018-09-19 (×4): 50 mg via INTRAVENOUS
  Filled 2018-09-18 (×4): qty 2

## 2018-09-18 MED ORDER — VANCOMYCIN HCL IN DEXTROSE 1-5 GM/200ML-% IV SOLN: 1000.0000 mg | Freq: Once | INTRAVENOUS | Status: DC

## 2018-09-18 MED ORDER — CALCIUM CARBONATE-VITAMIN D 500-200 MG-UNIT PO TABS
1.0000 | ORAL_TABLET | Freq: Two times a day (BID) | ORAL | Status: DC
  Administered 2018-09-18 – 2018-09-25 (×14): 1 via ORAL
  Filled 2018-09-18 (×14): qty 1

## 2018-09-18 MED ORDER — TESTOSTERONE CYPIONATE 200 MG/ML IM SOLN: 75.0000 mg | INTRAMUSCULAR | Status: DC

## 2018-09-18 MED ORDER — NYSTATIN 100000 UNIT/ML MT SUSP
5.0000 mL | Freq: Four times a day (QID) | OROMUCOSAL | Status: DC
  Administered 2018-09-18 – 2018-09-25 (×25): 500000 [IU] via ORAL
  Filled 2018-09-18 (×25): qty 5

## 2018-09-18 MED ORDER — LACTATED RINGERS IV BOLUS (SEPSIS)
250.0000 mL | Freq: Once | INTRAVENOUS | Status: AC
  Administered 2018-09-18: 250 mL via INTRAVENOUS

## 2018-09-18 MED ORDER — LEVOTHYROXINE SODIUM 25 MCG PO TABS: 137.0000 ug | ORAL_TABLET | Freq: Every day | ORAL | Status: DC

## 2018-09-18 MED ORDER — PRAVASTATIN SODIUM 40 MG PO TABS
80.0000 mg | ORAL_TABLET | Freq: Every day | ORAL | Status: DC
  Administered 2018-09-18 – 2018-09-25 (×8): 80 mg via ORAL
  Filled 2018-09-18 (×9): qty 2

## 2018-09-18 MED ORDER — VANCOMYCIN HCL 10 G IV SOLR
1250.0000 mg | INTRAVENOUS | Status: DC
  Administered 2018-09-19: 1250 mg via INTRAVENOUS
  Filled 2018-09-18 (×2): qty 1250

## 2018-09-18 NOTE — Progress Notes (Addendum)
Pharmacy Antibiotic Note  Ryan Coffey is a 85 y.o. male admitted on 09/18/2018 with pneumonia.  Pharmacy has been consulted for vancomycin and cefepime dosing. Already received one time dose of cefepime 2g x1 and vanc 1250mg  x1 in ED. Tmax 100.9 and WBC 20.5.  Plan: Cefepime 2g IV q12h Vancomycin 1.25g IV q24h Monitor renal function and clinical progression F/u C&S  Goal AUC 400-550. Expected AUC: 474.6 SCr used: 0.91  Height: 5\' 8"  (172.7 cm) Weight: 150 lb (68 kg) IBW/kg (Calculated) : 68.4  Temp (24hrs), Avg:100.9 F (38.3 C), Min:100.9 F (38.3 C), Max:100.9 F (38.3 C)  Recent Labs  Lab 09/16/18 2203 09/18/18 1228  WBC 19.3* 20.5*  CREATININE 1.26* 0.91  LATICACIDVEN  --  1.5    Estimated Creatinine Clearance: 59.2 mL/min (by C-G formula based on SCr of 0.91 mg/dL).    No Known Allergies  Antimicrobials this admission: Cefepime 7/16 >> Vancomycin 7/16 >>  Dose adjustments this admission: n/a  Microbiology results: pending  Thank you for allowing pharmacy to be a part of this patient's care.  Ryan Coffey 09/18/2018 3:37 PM

## 2018-09-18 NOTE — Progress Notes (Signed)
1230 pm Received call from pt's dtr, Vevelyn Francois [phone]. Stating she has HCPOA and states she was unaware that pt was requested to stay his last visit in ED on 09/16/2018. States pt his ability to make decision have declined since being sick. She is requesting ED attending follow up with pt's condition. Message sent to attending ED MD at dtr's request. Jonnie Finner RN CCM Case Mgmt phone [phone]

## 2018-09-18 NOTE — Plan of Care (Signed)
  Problem: Clinical Measurements: Goal: Diagnostic test results will improve Outcome: Progressing Goal: Respiratory complications will improve Outcome: Progressing Goal: Cardiovascular complication will be avoided Outcome: Progressing   

## 2018-09-18 NOTE — ED Triage Notes (Signed)
Pt from home via ems; seen in ED this past Tuesday pneumonia, sent home with antibiotics, covid neg; fever 102.2 today; a and o x 4; c/o worseing sob; 83% RA, placed on NRB, 99% at 15L; 450 cc NS given pta; denies pain, denies n/v/d; rales lower R lobe with ems; productive cough present; denies sick contacts  105/66 CBG 98 RR 28

## 2018-09-18 NOTE — ED Notes (Signed)
Pt aware that a urine sample is needed; urinal at bedside

## 2018-09-18 NOTE — ED Provider Notes (Signed)
[facility] EMERGENCY DEPARTMENT Provider Note   CSN: [id number] Arrival date & time: 09/18/18  1204    History   Chief Complaint Chief Complaint  Patient presents with  . Shortness of Breath    HPI Ryan Coffey is a 85 y.o. male.     HPI  The patient is an 85 year old male, history of COPD as well as a history of pneumonia in the past.  The patient presents today with increasing shortness of breath cough and severe fatigue which worsened overnight.  My review of the medical record my review of the medical record shows that the patient had been seen in the emergency department on July 14, 2 days ago, at which time the patient had been complaining of some dehydration, had also been recently started on a new inhaled pulmonary regimen.  He had developed some some cough and sore throat and noted that his oxygen saturations are normally in the 90 to 91% range due to COPD.  His vital signs at the time showed a blood pressure of 100/80, pulse of 89, temperature of 98.4 and he was mildly tachypneic.  He had decreased air movement, a white blood cell count of 19,000 and a BNP of 368.  He was COVID negative.  A CT scan that was performed on July 10 showed extensive masslike consolidation of the right lung that was concerning for infection or mass.  The x-ray that was performed on the 14th also showed the dense consolidation in the mid and lower lung on the right side.  The patient was adamant about being discharged and was given a prescription for Levaquin which he now admits he does not take in because of a sore throat.  His shortness of breath and generalized weakness has worsened.  He has also noted to have increasing cough with purulent sputum.  he was found to be tachycardic and hypoxic to 84% at home, not on home oxygen.  Paramedics transported the patient with likely sepsis.  Past Medical History:  Diagnosis Date  . Arthritis   . Benign tumor of pituitary gland ([place])     "wasn't able to get it all"  . COPD (chronic obstructive pulmonary disease) ([place] [facility] [place])   . Pneumonia 06/27/11   "first time"  . Shingles     Patient Active Problem List   Diagnosis Date Noted  . Other emphysema ([place]) 08/29/2018  . Unintentional weight loss 06/18/2018  . Chronic cough 06/18/2018  . COPD (chronic obstructive pulmonary disease) ([place]) 08/29/2016  . Rheumatoid nodulosis ([place]) 08/24/2016  . High risk medication use 08/24/2016  . Thrombocytosis ([place]) 08/24/2016  . Elevated LFTs mild elevation of ALT  08/24/2016  . History of renal cell carcinoma 08/24/2016  . History of prostate cancer 08/24/2016  . History of lung cancer 08/24/2016  . History of adrenal insufficiency 08/24/2016  . HCAP (healthcare-associated pneumonia) 06/27/2011    Class: Acute  . Seropositive rheumatoid arthritis ([place]) 06/27/2011  . Panhypopituitarism ([place]) 06/27/2011  . Degenerative joint disease 06/27/2011  . History of tobacco use 06/27/2011  . Hyperlipidemia 06/27/2011    Past Surgical History:  Procedure Laterality Date  . CHOLECYSTECTOMY OPEN  2010  . KIDNEY SURGERY  2011   "lesion removed right side"  . RENAL BIOPSY  2012   right  . TRANSPHENOIDAL / TRANSNASAL HYPOPHYSECTOMY / RESECTION PITUITARY TUMOR  ~ 1979   "they couldn't get it all"        Home Medications    Prior to Admission  medications   Medication Sig Start Date End Date Taking? Authorizing Provider  acetaminophen (TYLENOL) 325 MG tablet Take 650 mg by mouth 2 (two) times daily as needed (arthritis).    [provider]  albuterol (PROVENTIL HFA;VENTOLIN HFA) 108 (90 Base) MCG/ACT inhaler Inhale 2 puffs into the lungs every 6 (six) hours as needed for wheezing or shortness of breath. 05/20/18   Shelly Coss, MD  bromocriptine (PARLODEL) 2.5 MG tablet Take 2.5 mg by mouth 2 (two) times daily.    [provider]  budesonide-formoterol (SYMBICORT) 80-4.5 MCG/ACT inhaler Inhale 2 puffs into the lungs 2 (two)  times a day. 09/11/18   Fenton Foy, NP  Calcium Carbonate-Vitamin D (CALCIUM 500 + D) 500-125 MG-UNIT TABS Take 1 tablet by mouth 2 (two) times daily.    [provider]  Fluticasone-Umeclidin-Vilant (TRELEGY ELLIPTA) 100-62.5-25 MCG/INH AEPB Inhale 1 puff into the lungs daily. 08/29/18   Mannam, Hart Robinsons, MD  Fluticasone-Umeclidin-Vilant (TRELEGY ELLIPTA) 100-62.5-25 MCG/INH AEPB Inhale 1 puff into the lungs daily. 08/29/18   Mannam, Hart Robinsons, MD  folic acid (FOLVITE) 1 MG tablet TAKE 2 TABLETS EVERY DAY 08/04/18   Deveshwar, Abel Presto, MD  guaiFENesin-dextromethorphan (ROBITUSSIN DM) 100-10 MG/5ML syrup Take 10 mLs by mouth every 4 (four) hours as needed for cough. 05/20/18   Shelly Coss, MD  hydrocortisone (CORTEF) 20 MG tablet Take 10-20 mg by mouth daily. 20 mg in the morning and 10mg  at night 11/02/16   [provider]  levofloxacin (LEVAQUIN) 750 MG tablet Take 1 tablet (750 mg total) by mouth daily for 7 days. 09/16/18 09/23/18  Carmin Muskrat, MD  levothyroxine (SYNTHROID, LEVOTHROID) 137 MCG tablet Take 137 mcg by mouth daily.      [provider]  methotrexate (RHEUMATREX) 2.5 MG tablet TAKE 4 TABLETS ON SATURDAY AND 4 TABLETS ON SUNDAY. CAUTION:CHEMOTHERAPY. PROTECT FROM LIGHT. 09/10/18   Bo Merino, MD  Multiple Vitamins-Minerals (CENTRUM SILVER PO) Take 1 tablet by mouth daily.     [provider]  nystatin (MYCOSTATIN) 100000 UNIT/ML suspension Take 5 mLs (500,000 Units total) by mouth 4 (four) times daily for 6 days. 09/14/18 09/20/18  Candee Furbish, MD  pantoprazole (PROTONIX) 40 MG tablet Take 40 mg by mouth 2 (two) times daily.    [provider]  pravastatin (PRAVACHOL) 80 MG tablet Take 80 mg by mouth daily.    [provider]  testosterone cypionate (DEPOTESTOSTERONE CYPIONATE) 200 MG/ML injection Inject 75 mg into the muscle every 14 (fourteen) days.  05/24/16   [provider]  zolpidem (AMBIEN CR) 6.25 MG CR  tablet Take 1 tablet (6.25 mg total) by mouth at bedtime as needed for sleep. 05/20/18   Shelly Coss, MD    Family History Family History  Problem Relation Age of Onset  . Heart disease Mother   . Cancer Brother   . Heart disease Brother     Social History Social History   Tobacco Use  . Smoking status: Former Smoker    Packs/day: 0.50    Years: 10.00    Pack years: 5.00    Types: Cigarettes    Quit date: 08/03/1980    Years since quitting: 38.1  . Smokeless tobacco: Never Used  Substance Use Topics  . Alcohol use: Yes    Alcohol/week: 3.0 standard drinks    Types: 3 Glasses of wine per week  . Drug use: No     Allergies   Patient has no known allergies.   Review of Systems Review  of Systems  All other systems reviewed and are negative.    Physical Exam Updated Vital Signs BP (!) 107/57   Pulse (!) 108   Temp (!) 100.9 F (38.3 C) (Oral)   Resp (!) 33   Ht 1.727 m (5\' 8" )   Wt 68 kg   SpO2 93%   BMI 22.81 kg/m   Physical Exam Vitals signs and nursing note reviewed.  Constitutional:      General: He is in acute distress.     Appearance: He is well-developed. He is ill-appearing.  HENT:     Head: Normocephalic and atraumatic.     Mouth/Throat:     Mouth: Mucous membranes are dry.     Pharynx: No oropharyngeal exudate.  Eyes:     General: No scleral icterus.       Right eye: No discharge.        Left eye: No discharge.     Conjunctiva/sclera: Conjunctivae normal.     Pupils: Pupils are equal, round, and reactive to light.  Neck:     Musculoskeletal: Normal range of motion and neck supple.     Thyroid: No thyromegaly.     Vascular: No JVD.  Cardiovascular:     Rate and Rhythm: Regular rhythm. Tachycardia present.     Heart sounds: Normal heart sounds. No murmur. No friction rub. No gallop.   Pulmonary:     Effort: Respiratory distress present.     Breath sounds: Wheezing and rales present.     Comments: Rales surrounding the right base and  midlung, tachypnea, hypoxia at 84%, increased work of breathing and respiratory distress is evident Abdominal:     General: Bowel sounds are normal. There is no distension.     Palpations: Abdomen is soft. There is no mass.     Tenderness: There is no abdominal tenderness.  Musculoskeletal: Normal range of motion.        General: No tenderness.  Lymphadenopathy:     Cervical: No cervical adenopathy.  Skin:    General: Skin is warm and dry.     Findings: No erythema or rash.  Neurological:     Mental Status: He is alert.     Coordination: Coordination normal.  Psychiatric:        Behavior: Behavior normal.      ED Treatments / Results  Labs (all labs ordered are listed, but only abnormal results are displayed) Labs Reviewed  COMPREHENSIVE METABOLIC PANEL - Abnormal; Notable for the following components:      Result Value   Sodium 133 (*)    Chloride 96 (*)    Calcium 8.4 (*)    Total Protein 5.3 (*)    Albumin 1.7 (*)    AST 43 (*)    All other components within normal limits  CBC WITH DIFFERENTIAL/PLATELET - Abnormal; Notable for the following components:   WBC 20.5 (*)    RBC 3.77 (*)    Hemoglobin 11.4 (*)    HCT 35.0 (*)    RDW 15.9 (*)    Platelets 420 (*)    Neutro Abs 18.8 (*)    Abs Immature Granulocytes 0.24 (*)    All other components within normal limits  CULTURE, BLOOD (ROUTINE X 2)  CULTURE, BLOOD (ROUTINE X 2)  URINE CULTURE  LACTIC ACID, PLASMA  APTT  PROTIME-INR  LACTIC ACID, PLASMA  URINALYSIS, ROUTINE W REFLEX MICROSCOPIC    EKG EKG Interpretation  Date/Time:  Thursday September 18 2018 12:19:31 EDT Ventricular  Rate:  113 PR Interval:    QRS Duration: 76 QT Interval:  299 QTC Calculation: 410 R Axis:   85 Text Interpretation:  Sinus tachycardia Borderline right axis deviation Since last tracing rate faster Confirmed by Noemi Chapel [phone]) on 09/18/2018 12:24:24 PM   Radiology Dg Chest Port 1 View  Result Date: 09/18/2018 CLINICAL  DATA:  Shortness of breath, fever and pneumonia. EXAM: PORTABLE CHEST 1 VIEW COMPARISON:  September 16, 2018 FINDINGS: The cardiac silhouette is stably enlarged. Calcific atherosclerotic disease of the aorta. Upper lobe predominant emphysema. No significant change in patchy airspace consolidation in the right mid and lower lung. Minimal peribronchial airspace consolidation the left lower lung. Osseous structures are without acute abnormality. Soft tissues are grossly normal. IMPRESSION: 1. No significant change in the patchy airspace consolidation in the right mid and lower lung. 2. Minimal peribronchial airspace consolidation in the left lower lung. Electronically Signed   By: Fidela Salisbury M.D.   On: 09/18/2018 12:48   Dg Chest Port 1 View  Result Date: 09/16/2018 CLINICAL DATA:  Shortness of breath EXAM: PORTABLE CHEST 1 VIEW COMPARISON:  September 12, 2018.  Chest x-ray 05/16/2018 FINDINGS: Consolidation in the right mid and lower lung compatible with pneumonia. Left lung clear. Heart is normal size. No effusions or acute bony abnormality. IMPRESSION: Dense consolidation in the right mid and lower lung as seen on prior CT compatible with pneumonia. Electronically Signed   By: Rolm Baptise M.D.   On: 09/16/2018 22:14    Procedures .Critical Care Performed by: Noemi Chapel, MD Authorized by: Noemi Chapel, MD   Critical care provider statement:    Critical care time (minutes):  35   Critical care time was exclusive of:  Separately billable procedures and treating other patients and teaching time   Critical care was necessary to treat or prevent imminent or life-threatening deterioration of the following conditions:  Sepsis and respiratory failure   Critical care was time spent personally by me on the following activities:  Blood draw for specimens, development of treatment plan with patient or surrogate, discussions with consultants, evaluation of patient's response to treatment, examination of  patient, obtaining history from patient or surrogate, ordering and performing treatments and interventions, ordering and review of laboratory studies, ordering and review of radiographic studies, pulse oximetry, re-evaluation of patient's condition and review of old charts   (including critical care time)  Medications Ordered in ED Medications  lactated ringers bolus 1,000 mL (has no administration in time range)    And  lactated ringers bolus 1,000 mL (1,000 mLs Intravenous New Bag/Given 09/18/18 1302)    And  lactated ringers bolus 250 mL (has no administration in time range)  ceFEPIme (MAXIPIME) 2 g in sodium chloride 0.9 % 100 mL IVPB (2 g Intravenous New Bag/Given 09/18/18 1306)  vancomycin (VANCOCIN) 1,250 mg in sodium chloride 0.9 % 250 mL IVPB (has no administration in time range)  acetaminophen (TYLENOL) tablet 1,000 mg (1,000 mg Oral Given 09/18/18 1311)     Initial Impression / Assessment and Plan / ED Course  I have reviewed the triage vital signs and the nursing notes.  Pertinent labs & imaging results that were available during my care of the patient were reviewed by me and considered in my medical decision making (see chart for details).        The patient is ill-appearing, he has a known infiltrate in the right base, I suspect he is having decompensated pneumonia and after being treated with  Levaquin at his last visit and being noncompliant with antibiotics for the last 2 days he may need even broader coverage.  I suspect that now that he has fever tachycardia and hypoxia that he meets criteria for sepsis if not severe sepsis, IV fluids have been added, broad-spectrum antibiotics, he will need to be admitted to the hospital.  His EKG thankfully shows no signs of ischemia.  The patient is definitely critically ill with sepsis.  Multiple antibiotics given, multiple fluid boluses given.  I discussed the care with the hospitalist who will admit the patient to the hospital.  He  will need a high level of care for what appears to be pulmonary sepsis.  Reexamined several times, appears to require ongoing oxygen support.  Sepsis - Repeat Assessment  Performed at:    1:36 PM   Vitals     Blood pressure (!) 107/57, pulse (!) 108, temperature (!) 100.9 F (38.3 C), temperature source Oral, resp. rate (!) 33, height 1.727 m (5\' 8" ), weight 68 kg, SpO2 93 %.  Heart:     Tachycardic  Lungs:    Rales  Capillary Refill:   <2 sec  Peripheral Pulse:   Radial pulse palpable  Skin:     Normal Color     Final Clinical Impressions(s) / ED Diagnoses   Final diagnoses:  Sepsis with acute hypoxic respiratory failure without septic shock, due to unspecified organism ([place])  HCAP (healthcare-associated pneumonia)      Noemi Chapel, MD 09/18/18 1337

## 2018-09-18 NOTE — H&P (Addendum)
History and Physical:    Ryan Coffey   HOZ:[id number] DOB: [birth year] DOA: 09/18/2018  Referring MD/provider: Dr Sabra Heck  PCP: Prince Solian, MD   Patient coming from: Home  Chief Complaint: weakness, sob   History of Present Illness:   Ryan Coffey is an 85 y.o. male with past medical history of renal cell carcinoma in remission, COPD,  pituitary tumor, hyperlipidemia, rheumatoid arthritis on MTX who states he was in his usual state of health until June 28 when he was given an inhaler (Symbicort) by his pulmonologist.  Patient notes that he almost immediately developed pain in the back of his throat and a coated tongue.  He states he has been unable to eat since then due to sore throat.  She was treated with nystatin by his PCP.  However notes his p.o. intake has decreased significantly and he feels that this is led to weakness.  Patient presented to the ED 2 days ago for treatment of weakness where he was found to have a right lower lobe infiltrate as well as some mild renal insufficiency.  He was recommended to be admitted however after receiving some IV fluid resuscitation patient chose to go home.  He was discharged with a prescription for Levaquin which she states he did not fill.  According to his daughter he told them that he had been discharged and they did not know that he had pneumonia.  Patient now comes back in today with even worse weakness than he had previously.  Patient notes that he usually is able to get about his house without difficulty however over the past couple of days he has had difficulty just even walking to the kitchen due to weakness.  Patient denies shortness of breath per se.  He attributes all of his decreased functionality to weakness.  No chest pain.  Patient does have a cough which is been present for 5 to 6 months.  He notes over the past 3 months it has been productive of a greenish-whitish phlegm.  Patient denies orthopnea or PND.  No  palpitations.  He thinks he might be dizzy but he is not sure.  He has not had any falls.  No nausea vomiting or diarrhea.  As noted above he has had significant decreased p.o. intake.  Past medical history is notable for admission for pneumonia in March 2020.  Patient states that he was able to make full recovery after that admission and he had been riding his bike and feeling well in the interim until 2 weeks ago as noted above.  Of note however repeat chest x-ray showed persistent but resolving right lower lobe infiltrates.  Patient was seen by Dr. Vaughan Browner of [facility] on 626 and high-res CT was ordered which was done on 09/12/2018 which showed "extensive somewhat masslike consolidation of the dependent right lung, centered about the superior segment of the right lower lobe although involving the right middle lobe as well.  Findings likely reflect infection or aspiration, although underlying mass is not excluded.  Recommend follow-up CT in 6 to 8 weeks".   ED Course:  The patient was noted to be febrile to 100.9 and be somewhat hypotensive at 95/55.  He was hypoxic with O2 saturations at 85 on room air.  Oratory data was notable for mild hyponatremia with a sodium of 133 and market leukocytosis with a WBC of 20 with left shift.  Chest x-ray shows continued dense consolidation of right middle and lower lobes  as previously seen.  Patient is now willing to be admitted for ongoing treatment  ROS:   ROS   Review of Systems: Skin: No rashes, lesions, wounds Eyes: no discharge, redness, pain HENT: no ear pain, hearing loss, drainage, tinnitus Endocrine: no heat/cold intolerance, no polyuria Respiratory: Positive cough,, shortness of breath, no hemoptysis Cardiovascular: No palpitations, chest pain GI: No nausea, vomiting, diarrhea, constipation GU: No dysuria, increased frequency CNS: No numbness, dizziness, headache Musculoskeletal: No back pain, joint pain Blood/lymphatics: No easy  bruising, bleeding Mood/affect: No anxiety/depression    Past Medical History:   Past Medical History:  Diagnosis Date  . Arthritis   . Benign tumor of pituitary gland ([place])    "wasn't able to get it all"  . COPD (chronic obstructive pulmonary disease) ([place])   . Pneumonia 06/27/11   "first time"  . Shingles     Past Surgical History:   Past Surgical History:  Procedure Laterality Date  . CHOLECYSTECTOMY OPEN  2010  . KIDNEY SURGERY  2011   "lesion removed right side"  . RENAL BIOPSY  2012   right  . TRANSPHENOIDAL / TRANSNASAL HYPOPHYSECTOMY / RESECTION PITUITARY TUMOR  ~ 1979   "they couldn't get it all"    Social History:   Social History   Socioeconomic History  . Marital status: Married    Spouse name: Not on file  . Number of children: Not on file  . Years of education: Not on file  . Highest education level: Not on file  Occupational History  . Not on file  Social Needs  . Financial resource strain: Not on file  . Food insecurity    Worry: Not on file    Inability: Not on file  . Transportation needs    Medical: Not on file    Non-medical: Not on file  Tobacco Use  . Smoking status: Former Smoker    Packs/day: 0.50    Years: 10.00    Pack years: 5.00    Types: Cigarettes    Quit date: 08/03/1980    Years since quitting: 38.1  . Smokeless tobacco: Never Used  Substance and Sexual Activity  . Alcohol use: Yes    Alcohol/week: 3.0 standard drinks    Types: 3 Glasses of wine per week  . Drug use: No  . Sexual activity: Yes  Lifestyle  . Physical activity    Days per week: Not on file    Minutes per session: Not on file  . Stress: Not on file  Relationships  . Social Herbalist on phone: Not on file    Gets together: Not on file    Attends religious service: Not on file    Active member of club or organization: Not on file    Attends meetings of clubs or organizations: Not on file    Relationship status: Not on file  . Intimate  partner violence    Fear of current or ex partner: Not on file    Emotionally abused: Not on file    Physically abused: Not on file    Forced sexual activity: Not on file  Other Topics Concern  . Not on file  Social History Narrative  . Not on file    Allergies   Patient has no known allergies.  Family history:   Family History  Problem Relation Age of Onset  . Heart disease Mother   . Cancer Brother   . Heart disease Brother  Current Medications:   Prior to Admission medications   Medication Sig Start Date End Date Taking? Authorizing Provider  acetaminophen (TYLENOL) 325 MG tablet Take 650 mg by mouth 2 (two) times daily as needed (arthritis).    [provider]  albuterol (PROVENTIL HFA;VENTOLIN HFA) 108 (90 Base) MCG/ACT inhaler Inhale 2 puffs into the lungs every 6 (six) hours as needed for wheezing or shortness of breath. 05/20/18   Shelly Coss, MD  bromocriptine (PARLODEL) 2.5 MG tablet Take 2.5 mg by mouth 2 (two) times daily.    [provider]  budesonide-formoterol (SYMBICORT) 80-4.5 MCG/ACT inhaler Inhale 2 puffs into the lungs 2 (two) times a day. 09/11/18   Fenton Foy, NP  Calcium Carbonate-Vitamin D (CALCIUM 500 + D) 500-125 MG-UNIT TABS Take 1 tablet by mouth 2 (two) times daily.    [provider]  Fluticasone-Umeclidin-Vilant (TRELEGY ELLIPTA) 100-62.5-25 MCG/INH AEPB Inhale 1 puff into the lungs daily. 08/29/18   Mannam, Hart Robinsons, MD  Fluticasone-Umeclidin-Vilant (TRELEGY ELLIPTA) 100-62.5-25 MCG/INH AEPB Inhale 1 puff into the lungs daily. 08/29/18   Mannam, Hart Robinsons, MD  folic acid (FOLVITE) 1 MG tablet TAKE 2 TABLETS EVERY DAY 08/04/18   Deveshwar, Abel Presto, MD  guaiFENesin-dextromethorphan (ROBITUSSIN DM) 100-10 MG/5ML syrup Take 10 mLs by mouth every 4 (four) hours as needed for cough. 05/20/18   Shelly Coss, MD  hydrocortisone (CORTEF) 20 MG tablet Take 10-20 mg by mouth daily. 20 mg in the morning and 10mg  at night 11/02/16    [provider]  levofloxacin (LEVAQUIN) 750 MG tablet Take 1 tablet (750 mg total) by mouth daily for 7 days. 09/16/18 09/23/18  Carmin Muskrat, MD  levothyroxine (SYNTHROID, LEVOTHROID) 137 MCG tablet Take 137 mcg by mouth daily.      [provider]  methotrexate (RHEUMATREX) 2.5 MG tablet TAKE 4 TABLETS ON SATURDAY AND 4 TABLETS ON SUNDAY. CAUTION:CHEMOTHERAPY. PROTECT FROM LIGHT. 09/10/18   Bo Merino, MD  Multiple Vitamins-Minerals (CENTRUM SILVER PO) Take 1 tablet by mouth daily.     [provider]  nystatin (MYCOSTATIN) 100000 UNIT/ML suspension Take 5 mLs (500,000 Units total) by mouth 4 (four) times daily for 6 days. 09/14/18 09/20/18  Candee Furbish, MD  pantoprazole (PROTONIX) 40 MG tablet Take 40 mg by mouth 2 (two) times daily.    [provider]  pravastatin (PRAVACHOL) 80 MG tablet Take 80 mg by mouth daily.    [provider]  testosterone cypionate (DEPOTESTOSTERONE CYPIONATE) 200 MG/ML injection Inject 75 mg into the muscle every 14 (fourteen) days.  05/24/16   [provider]  zolpidem (AMBIEN CR) 6.25 MG CR tablet Take 1 tablet (6.25 mg total) by mouth at bedtime as needed for sleep. 05/20/18   Shelly Coss, MD    Physical Exam:   Vitals:   09/18/18 1211 09/18/18 1214 09/18/18 1215 09/18/18 1245  BP:   108/63 (!) 107/57  Pulse:   (!) 112 (!) 108  Resp:   (!) 31 (!) 33  Temp:   (!) 100.9 F (38.3 C)   TempSrc:   Oral   SpO2: (!) 85%  93% 93%  Weight:  68 kg    Height:  5\' 8"  (1.727 m)       Physical Exam: Blood pressure (!) 107/57, pulse (!) 108, temperature (!) 100.9 F (38.3 C), temperature source Oral, resp. rate (!) 33, height 5\' 8"  (1.727 m), weight 68 kg, SpO2 93 %. Gen: Chronically ill-appearing man with unlabored tachypnea lying in bed at 20 degrees  in mild respiratory distress.  Patient is able to speak in full but short sentences.  No accessory muscle use. Eyes: Sclerae anicteric. Conjunctiva  mildly injected. Neck: Supple, no jugular venous distention. Chest: Poor air entry bilaterally with coarse crackles and rhonchi at the right base with positive egophony.   CV: Distant, regular, no audible murmurs. Abdomen: NABS, soft, nondistended, nontender. No tenderness to light or deep palpation. No rebound, no guarding. Extremities: Patient with severe ulnar deviation bilaterally. Skin: Warm and dry. No rashes, lesions or wounds. Neuro: Alert and oriented times 3; grossly nonfocal. Psych: Patient is cooperative, logical and coherent with appropriate mood and affect.  Data Review:    Labs: Basic Metabolic Panel: Recent Labs  Lab 09/16/18 2203 09/18/18 1228  NA 131* 133*  K 3.9 3.9  CL 93* 96*  CO2 25 24  GLUCOSE 142* 94  BUN 25* 18  CREATININE 1.26* 0.91  CALCIUM 9.0 8.4*   Liver Function Tests: Recent Labs  Lab 09/16/18 2203 09/18/18 1228  AST 30 43*  ALT 17 17  ALKPHOS 104 83  BILITOT 0.9 1.1  PROT 6.4* 5.3*  ALBUMIN 2.0* 1.7*   No results for input(s): LIPASE, AMYLASE in the last 168 hours. No results for input(s): AMMONIA in the last 168 hours. CBC: Recent Labs  Lab 09/16/18 2203 09/18/18 1228  WBC 19.3* 20.5*  NEUTROABS 17.4* 18.8*  HGB 12.8* 11.4*  HCT 39.7 35.0*  MCV 94.7 92.8  PLT 568* 420*   Cardiac Enzymes: No results for input(s): CKTOTAL, CKMB, CKMBINDEX, TROPONINI in the last 168 hours.  BNP (last 3 results) No results for input(s): PROBNP in the last 8760 hours. CBG: No results for input(s): GLUCAP in the last 168 hours.  Urinalysis    Component Value Date/Time   COLORURINE AMBER (A) 05/15/2018 1528   APPEARANCEUR HAZY (A) 05/15/2018 1528   LABSPEC 1.020 05/15/2018 1528   PHURINE 5.0 05/15/2018 1528   GLUCOSEU NEGATIVE 05/15/2018 1528   HGBUR NEGATIVE 05/15/2018 1528   BILIRUBINUR NEGATIVE 05/15/2018 1528   KETONESUR 5 (A) 05/15/2018 1528   PROTEINUR 100 (A) 05/15/2018 1528   UROBILINOGEN 1.0 05/23/2011 0953   NITRITE  NEGATIVE 05/15/2018 1528   LEUKOCYTESUR NEGATIVE 05/15/2018 1528      Radiographic Studies: Dg Chest Port 1 View  Result Date: 09/18/2018 CLINICAL DATA:  Shortness of breath, fever and pneumonia. EXAM: PORTABLE CHEST 1 VIEW COMPARISON:  September 16, 2018 FINDINGS: The cardiac silhouette is stably enlarged. Calcific atherosclerotic disease of the aorta. Upper lobe predominant emphysema. No significant change in patchy airspace consolidation in the right mid and lower lung. Minimal peribronchial airspace consolidation the left lower lung. Osseous structures are without acute abnormality. Soft tissues are grossly normal. IMPRESSION: 1. No significant change in the patchy airspace consolidation in the right mid and lower lung. 2. Minimal peribronchial airspace consolidation in the left lower lung. Electronically Signed   By: Fidela Salisbury M.D.   On: 09/18/2018 12:48   Dg Chest Port 1 View  Result Date: 09/16/2018 CLINICAL DATA:  Shortness of breath EXAM: PORTABLE CHEST 1 VIEW COMPARISON:  September 12, 2018.  Chest x-ray 05/16/2018 FINDINGS: Consolidation in the right mid and lower lung compatible with pneumonia. Left lung clear. Heart is normal size. No effusions or acute bony abnormality. IMPRESSION: Dense consolidation in the right mid and lower lung as seen on prior CT compatible with pneumonia. Electronically Signed   By: Rolm Baptise M.D.   On: 09/16/2018 22:14    EKG: Independently  reviewed.  Sinus rhythm at 110.  First-degree AV block.  Normal axis.  Q waves with slight T wave inversions V1; flattened T waves in V2.  No acute ST-T wave changes.   Assessment/Plan:   Principal Problem:   HAP (hospital-acquired pneumonia) Active Problems:   Seropositive rheumatoid arthritis ([place])   Panhypopituitarism (H[facility])   History of renal cell carcinoma   History of prostate cancer   History of lung cancer   COPD (chronic obstructive pulmonary disease) ([facility])   Immunocompromised state ([place])   85 year old man with rheumatoid arthritis on methotrexate, renal cell carcinoma apparently in remission, panhypopituitary is him is admitted for possible non-resolving pneumonia.  He had been admitted in March with bilateral lower lobe pneumonia however repeat chest x-rays have shown persisted although improving infiltrates at the right base.  High risk CT done 09/12/2018 shows consolidation with possible mass at the right base.  Patient now presents with profound weakness and ongoing cough which is been present for 4 to 5 days.   RLL INFILATRATE Has a persistent right lower lobe infiltrate now with signs and symptoms consistent with pneumonia. Given recent admission to the hospital and IV antibiotic treatment and known immunocompromise state on MTX, will cover for Pseudomonas and vancomycin pending sputum culture.  Cefepime vancomycin and azithromycin ordered.  Off concern is how persistent this right lower lobe infiltrate is as well as results of high-res CT as noted in the HPI.  Could consider a pulmonary consultation for possible BAL versus bronc if patient does not respond to treatment as expected especially given previous history of renal cell carcinoma.  HYPONATREMIA Likely secondary to decreased p.o. intake although an element of SIADH may also be present He was fluid resuscitated with lactated Ringer's in the ED, I will continue 100 cc an hour for the next 24 ho urs as well.  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 Code Status: DNR Family Communication: Spoke with patient's daughter Ivin Booty' Disposition Plan: Home probably Consults called: None Admission status: Inpatient  The medical decision making on this patient was of  high complexity and the patient is at high risk for clinical deterioration, therefore this is a level 3 visit.   Dewaine Oats Tublu Adalin Vanderploeg [facility]  If 7PM-7AM, please contact night-coverage [email or web address] Password [email or web address] 09/18/2018, 1:55 PM

## 2018-09-18 NOTE — ED Notes (Signed)
Got patient undress on the monitor did ekg shown to Dr Sabra Heck patient is resting with call bell in reach

## 2018-09-18 NOTE — ED Notes (Signed)
ED TO INPATIENT HANDOFF REPORT  ED Nurse Name and Phone #: Davene Costain [phone]  S Name/Age/Gender Ryan Coffey 85 y.o. male Room/Bed: 024C/024C  Code Status   Code Status: Prior  Home/SNF/Other Home Patient oriented to: self, place, time and situation Is this baseline? Yes   Triage Complete: Triage complete  Chief Complaint pneumonia  Triage Note Pt from home via ems; seen in ED this past Tuesday pneumonia, sent home with antibiotics, covid neg; fever 102.2 today; a and o x 4; c/o worseing sob; 83% RA, placed on NRB, 99% at 15L; 450 cc NS given pta; denies pain, denies n/v/d; rales lower R lobe with ems; productive cough present; denies sick contacts  105/66 CBG 98 RR 28    Allergies No Known Allergies  Level of Care/Admitting Diagnosis ED Disposition    ED Disposition Condition [place]: [facility] [[id number]]  Level of Care: Progressive [102]  Covid Evaluation: Asymptomatic Screening Protocol (No Symptoms)  Diagnosis: HAP (hospital-acquired pneumonia) [371062]  Admitting Physician: Vashti Hey [[id number]]  Attending Physician: Vashti Hey [[id number]]  Estimated length of stay: 3 - 4 days  Certification:: I certify this patient will need inpatient services for at least 2 midnights  PT Class (Do Not Modify): Inpatient [101]  PT Acc Code (Do Not Modify): Private [1]       B Medical/Surgery History Past Medical History:  Diagnosis Date  . Arthritis   . Benign tumor of pituitary gland ([place])    "wasn't able to get it all"  . COPD (chronic obstructive pulmonary disease) ([place])   . Pneumonia 06/27/11   "first time"  . Shingles    Past Surgical History:  Procedure Laterality Date  . CHOLECYSTECTOMY OPEN  2010  . KIDNEY SURGERY  2011   "lesion removed right side"  . RENAL BIOPSY  2012   right  . TRANSPHENOIDAL / TRANSNASAL HYPOPHYSECTOMY / RESECTION PITUITARY TUMOR  ~ 1979   "they couldn't get it all"      A IV Location/Drains/Wounds Patient Lines/Drains/Airways Status   Active Line/Drains/Airways    Name:   Placement date:   Placement time:   Site:   Days:   Peripheral IV 09/18/18 Right Forearm   09/18/18    1236    Forearm   less than 1   Peripheral IV 09/18/18 Left Forearm   09/18/18    -    Forearm   less than 1          Intake/Output Last 24 hours  Intake/Output Summary (Last 24 hours) at 09/18/2018 1457 Last data filed at 09/18/2018 1356 Gross per 24 hour  Intake 1100 ml  Output -  Net 1100 ml    Labs/Imaging Results for orders placed or performed during the hospital encounter of 09/18/18 (from the past 48 hour(s))  Lactic acid, plasma     Status: None   Collection Time: 09/18/18 12:28 PM  Result Value Ref Range   Lactic Acid, Venous 1.5 0.5 - 1.9 mmol/L    Comment: Performed at [facility], [address], [place], [place] [address]  Comprehensive metabolic panel     Status: Abnormal   Collection Time: 09/18/18 12:28 PM  Result Value Ref Range   Sodium 133 (L) 135 - 145 mmol/L   Potassium 3.9 3.5 - 5.1 mmol/L   Chloride 96 (L) 98 - 111 mmol/L   CO2 24 22 - 32 mmol/L   Glucose, Bld 94 70 -  99 mg/dL   BUN 18 8 - 23 mg/dL   Creatinine, Ser 0.91 0.61 - 1.24 mg/dL   Calcium 8.4 (L) 8.9 - 10.3 mg/dL   Total Protein 5.3 (L) 6.5 - 8.1 g/dL   Albumin 1.7 (L) 3.5 - 5.0 g/dL   AST 43 (H) 15 - 41 U/L   ALT 17 0 - 44 U/L   Alkaline Phosphatase 83 38 - 126 U/L   Total Bilirubin 1.1 0.3 - 1.2 mg/dL   GFR calc non Af Amer >60 >60 mL/min   GFR calc Af Amer >60 >60 mL/min   Anion gap 13 5 - 15    Comment: Performed at [facility] [address], [place], [place] [address]  CBC WITH DIFFERENTIAL     Status: Abnormal   Collection Time: 09/18/18 12:28 PM  Result Value Ref Range   WBC 20.5 (H) 4.0 - 10.5 K/uL   RBC 3.77 (L) 4.22 - 5.81 MIL/uL   Hemoglobin 11.4 (L) 13.0 - 17.0 g/dL   HCT 35.0 (L) 39.0 - 52.0 %   MCV 92.8 80.0 - 100.0 fL   MCH 30.2 26.0 -  34.0 pg   MCHC 32.6 30.0 - 36.0 g/dL   RDW 15.9 (H) 11.5 - 15.5 %   Platelets 420 (H) 150 - 400 K/uL   nRBC 0.0 0.0 - 0.2 %   Neutrophils Relative % 92 %   Neutro Abs 18.8 (H) 1.7 - 7.7 K/uL   Lymphocytes Relative 4 %   Lymphs Abs 0.9 0.7 - 4.0 K/uL   Monocytes Relative 3 %   Monocytes Absolute 0.5 0.1 - 1.0 K/uL   Eosinophils Relative 0 %   Eosinophils Absolute 0.0 0.0 - 0.5 K/uL   Basophils Relative 0 %   Basophils Absolute 0.0 0.0 - 0.1 K/uL   Immature Granulocytes 1 %   Abs Immature Granulocytes 0.24 (H) 0.00 - 0.07 K/uL    Comment: Performed at [facility], [address] [place], [place] [address]  APTT     Status: None   Collection Time: 09/18/18 12:28 PM  Result Value Ref Range   aPTT 31 24 - 36 seconds    Comment: Performed at [facility] [address] [place], [place] [address]  Protime-INR     Status: None   Collection Time: 09/18/18 12:28 PM  Result Value Ref Range   Prothrombin Time 15.1 11.4 - 15.2 seconds   INR 1.2 0.8 - 1.2    Comment: (NOTE) INR goal varies based on device and disease states. Performed at [facility], [place] [address], [place], [place] [address]    Dg Chest Port 1 View  Result Date: 09/18/2018 CLINICAL DATA:  Shortness of breath, fever and pneumonia. EXAM: PORTABLE CHEST 1 VIEW COMPARISON:  September 16, 2018 FINDINGS: The cardiac silhouette is stably enlarged. Calcific atherosclerotic disease of the aorta. Upper lobe predominant emphysema. No significant change in patchy airspace consolidation in the right mid and lower lung. Minimal peribronchial airspace consolidation the left lower lung. Osseous structures are without acute abnormality. Soft tissues are grossly normal. IMPRESSION: 1. No significant change in the patchy airspace consolidation in the right mid and lower lung. 2. Minimal peribronchial airspace consolidation in the left lower lung. Electronically Signed   By: Fidela Salisbury M.D.   On: 09/18/2018 12:48   Dg  Chest Port 1 View  Result Date: 09/16/2018 CLINICAL DATA:  Shortness of breath EXAM: PORTABLE CHEST 1 VIEW COMPARISON:  September 12, 2018.  Chest x-ray 05/16/2018 FINDINGS: Consolidation in the right mid and lower lung compatible with pneumonia. Left lung clear. Heart is normal size. No effusions or acute bony abnormality. IMPRESSION: Dense consolidation in the right mid and lower lung as seen on prior CT compatible with pneumonia. Electronically Signed   By: Rolm Baptise M.D.   On: 09/16/2018 22:14    Pending Labs Unresulted Labs (From admission, onward)    Start     Ordered   09/18/18 1440  SARS Coronavirus 2 (CEPHEID - Performed in [facility] hospital lab), [facility]  Once,   STAT    Question:  Rule Out  Answer:  Yes   09/18/18 1439   09/18/18 1217  Lactic acid, plasma  Now then every 2 hours,   STAT     09/18/18 1217   09/18/18 1217  Blood Culture (routine x 2)  BLOOD CULTURE X 2,   STAT     09/18/18 1217   09/18/18 1217  Urinalysis, Routine w reflex microscopic  ONCE - STAT,   STAT     09/18/18 1217   09/18/18 1217  Urine culture  ONCE - STAT,   STAT     09/18/18 1217          Vitals/Pain Today's Vitals   09/18/18 1245 09/18/18 1345 09/18/18 1358 09/18/18 1430  BP: (!) 107/57 (!) 105/58  (!) 95/55  Pulse: (!) 108 (!) 106  94  Resp: (!) 33 (!) 26  (!) 21  Temp:      TempSrc:      SpO2: 93% 99%  94%  Weight:      Height:      PainSc:   0-No pain     Isolation Precautions No active isolations  Medications Medications  lactated ringers bolus 1,000 mL (1,000 mLs Intravenous New Bag/Given 09/18/18 1357)    And  lactated ringers bolus 1,000 mL (0 mLs Intravenous Stopped 09/18/18 1356)    And  lactated ringers bolus 250 mL (has no administration in time range)  vancomycin (VANCOCIN) 1,250 mg in sodium chloride 0.9 % 250 mL IVPB (1,250 mg Intravenous New Bag/Given 09/18/18 1356)  ceFEPIme (MAXIPIME) 2 g in sodium chloride 0.9 % 100 mL IVPB (0 g Intravenous Stopped 09/18/18  1353)  acetaminophen (TYLENOL) tablet 1,000 mg (1,000 mg Oral Given 09/18/18 1311)    Mobility walks     Focused Assessments Cardiac Assessment Handoff:  Cardiac Rhythm: Sinus tachycardia Lab Results  Component Value Date   CKTOTAL 65 05/16/2018   CKMB 2.1 05/23/2011   TROPONINI <0.30 05/23/2011   Lab Results  Component Value Date   DDIMER (H) 08/24/2006    3.72        AT THE INHOUSE ESTABLISHED CUTOFF VALUE OF 0.48 ug/mL FEU, THIS ASSAY HAS BEEN DOCUMENTED IN THE LITERATURE TO HAVE   Does the Patient currently have chest pain? No     R Recommendations: See Admitting Provider Note  Report given to:   Additional Notes:

## 2018-09-19 DIAGNOSIS — D72829 Elevated white blood cell count, unspecified: Secondary | ICD-10-CM

## 2018-09-19 LAB — BASIC METABOLIC PANEL
Anion gap: 7 (ref 5–15)
BUN: 16 mg/dL (ref 8–23)
CO2: 27 mmol/L (ref 22–32)
Calcium: 8.7 mg/dL — ABNORMAL LOW (ref 8.9–10.3)
Chloride: 101 mmol/L (ref 98–111)
Creatinine, Ser: 0.78 mg/dL (ref 0.61–1.24)
GFR calc Af Amer: >60 mL/min (ref >60)
GFR calc non Af Amer: >60 mL/min (ref >60)
Glucose, Bld: 127 mg/dL — ABNORMAL HIGH (ref 70–99)
Potassium: 4.3 mmol/L (ref 3.5–5.1)
Sodium: 135 mmol/L (ref 135–145)

## 2018-09-19 LAB — URINALYSIS, ROUTINE W REFLEX MICROSCOPIC
Bilirubin Urine: NEGATIVE
Glucose, UA: NEGATIVE mg/dL
Hgb urine dipstick: NEGATIVE
Ketones, ur: NEGATIVE mg/dL
Leukocytes,Ua: NEGATIVE
Nitrite: NEGATIVE
Protein, ur: NEGATIVE mg/dL
Specific Gravity, Urine: 1.023 (ref 1.005–1.030)
pH: 6 (ref 5.0–8.0)

## 2018-09-19 LAB — MRSA PCR SCREENING: MRSA by PCR: NEGATIVE

## 2018-09-19 LAB — HIV ANTIBODY (ROUTINE TESTING W REFLEX): HIV Screen 4th Generation wRfx: NONREACTIVE

## 2018-09-19 LAB — CBC
HCT: 37.3 % — ABNORMAL LOW (ref 39.0–52.0)
Hemoglobin: 11.6 g/dL — ABNORMAL LOW (ref 13.0–17.0)
MCH: 29.8 pg (ref 26.0–34.0)
MCHC: 31.1 g/dL (ref 30.0–36.0)
MCV: 95.9 fL (ref 80.0–100.0)
Platelets: 413 10*3/uL — ABNORMAL HIGH (ref 150–400)
RBC: 3.89 MIL/uL — ABNORMAL LOW (ref 4.22–5.81)
RDW: 15.9 % — ABNORMAL HIGH (ref 11.5–15.5)
WBC: 21.9 10*3/uL — ABNORMAL HIGH (ref 4.0–10.5)
nRBC: 0 % (ref 0.0–0.2)

## 2018-09-19 LAB — STREP PNEUMONIAE URINARY ANTIGEN: Strep Pneumo Urinary Antigen: NEGATIVE

## 2018-09-19 MED ORDER — ZOLPIDEM TARTRATE 5 MG PO TABS
5.0000 mg | ORAL_TABLET | Freq: Once | ORAL | Status: AC
  Administered 2018-09-19: 5 mg via ORAL
  Filled 2018-09-19: qty 1

## 2018-09-19 MED ORDER — HYDROCORTISONE NA SUCCINATE PF 100 MG IJ SOLR
25.0000 mg | Freq: Three times a day (TID) | INTRAMUSCULAR | Status: DC
  Administered 2018-09-19 – 2018-09-20 (×2): 25 mg via INTRAVENOUS
  Filled 2018-09-19 (×3): qty 2

## 2018-09-19 MED ORDER — GUAIFENESIN-DM 100-10 MG/5ML PO SYRP
5.0000 mL | ORAL_SOLUTION | ORAL | Status: DC | PRN
  Administered 2018-09-19 – 2018-09-23 (×4): 5 mL via ORAL
  Filled 2018-09-19 (×4): qty 5

## 2018-09-19 NOTE — Progress Notes (Signed)
Patient ID: Ryan Coffey, male   DOB: May 15, 1932, 85 y.o.   MRN: [id number]  PROGRESS NOTE    Ryan Coffey  SPQ:[id number] DOB: Jun 19, 1932 DOA: 09/18/2018 PCP: Prince Solian, MD   Brief Narrative:  85 year old male with history of renal cell carcinoma in remission, COPD, pediatric tumor, hyperlipidemia, rheumatoid arthritis on methotrexate, pneumonia in March 2020 who presented to the ED 2 days ago for treatment of weakness and was found to have right lower lobe infiltrate but patient was discharged home on oral Levaquin as per patient's request.  Patient presented again on 09/18/2018 with worsening weakness, cough, very poor appetite and difficulty swallowing.  He had a recent high-resolution CT chest done on 09/12/2018 as an outpatient as per Dr. Matilde Bash request which showed extensive somewhat masslike consolidation of the dependent right lung, centered about the superior segment of the right lower lobe although involving the right middle lobe as well.  Findings likely reflected infection or aspiration, although underlying mass is not excluded.  He was found to be febrile in the ED along with hypoxia and leukocytosis.  Patient was started on broad-spectrum antibiotics.  Assessment & Plan:   Acute hypoxic respiratory failure Probable healthcare associated pneumonia with concern for gram-negative pneumonia versus MRSA pneumonia presenting with multilobar right-sided pneumonia -Currently on 6 to 7 L oxygen via nasal cannula.  Does not use oxygen at home.  Continue broad-spectrum antibiotics for now.  Follow cultures.  Follow urine Legionella and streptococcal antigens.  Rapid COVID testing negative. -Incentive spirometry. -SLP evaluation for concern for aspiration -If symptoms persist after completion of treatment with antibiotics, patient will need pulmonary evaluation and probable bronchoscopy to rule out malignancy.  Patient was recently evaluated by pulmonary/Dr. Vaughan Browner as an outpatient who  had ordered the high-resolution CT.  Patient will need follow-up with pulmonary as an outpatient.  Leukocytosis -Probably from above  Hyponatremia -Probably from decreased p.o. intake.  Sodium improving.  Decrease Ringer's lactate to 50 cc an hour.  Encourage oral intake  Panhypopituitarism secondary to pituitary adenoma -Currently on stress dose steroids.  Will decrease Solu-Cortef to 25 mg every 8 hours. -Continue bromocriptine  Rheumatoid arthritis  -Continue methotrexate.  Outpatient follow-up  Hypothyroidism -Continue Synthroid  Hypoalbuminemia -Due to poor recent oral intake.  Nutrition consult   DVT prophylaxis: Lovenox Code Status: Full Family Communication: Spoke to patient at bedside Disposition Plan: Home in 1 to 3 days if clinically improved  Consultants: None  Procedures: None  Antimicrobials:  Cefepime, vancomycin and Zithromax from 09/18/2018 onwards  Subjective: Patient seen and examined at bedside.  He feels slightly better.  He feels a little stronger.  Appetite is still not that good but improving.  No overnight fever or vomiting.  No worsening shortness of breath.  Still requiring supplemental oxygen.  Objective: Vitals:   09/18/18 2003 09/19/18 0333 09/19/18 0334 09/19/18 0425  BP: (!) 95/50   115/72  Pulse: 75   72  Resp: 19   (!) 24  Temp: (!) 97.4 F (36.3 C)   97.6 F (36.4 C)  TempSrc: Oral   Oral  SpO2: 95% (!) 86% 92% 93%  Weight:    70.5 kg  Height:        Intake/Output Summary (Last 24 hours) at 09/19/2018 0955 Last data filed at 09/19/2018 0900 Gross per 24 hour  Intake 2590 ml  Output 450 ml  Net 2140 ml   Filed Weights   09/18/18 1214 09/19/18 0425  Weight: 68 kg 70.5 kg  Examination:  General exam: Appears calm and comfortable.  Looks very thinly built. Respiratory system: Bilateral decreased breath sounds at bases with scattered crackles, mostly on the right side Cardiovascular system: S1 & S2 heard, Rate controlled  Gastrointestinal system: Abdomen is nondistended, soft and nontender. Normal bowel sounds heard. Extremities: No cyanosis, clubbing, edema  Central nervous system: Alert and oriented. No focal neurological deficits. Moving extremities Skin: No rashes, lesions or ulcers Psychiatry: Judgement and insight appear normal. Mood & affect appropriate.     Data Reviewed: I have personally reviewed following labs and imaging studies  CBC: Recent Labs  Lab 09/16/18 2203 09/18/18 1228 09/18/18 1635 09/19/18 0503  WBC 19.3* 20.5* 21.9* 21.9*  NEUTROABS 17.4* 18.8*  --   --   HGB 12.8* 11.4* 11.3* 11.6*  HCT 39.7 35.0* 34.8* 37.3*  MCV 94.7 92.8 93.8 95.9  PLT 568* 420* 377 782*   Basic Metabolic Panel: Recent Labs  Lab 09/16/18 2203 09/18/18 1228 09/18/18 1635 09/19/18 0503  NA 131* 133*  --  135  K 3.9 3.9  --  4.3  CL 93* 96*  --  101  CO2 25 24  --  27  GLUCOSE 142* 94  --  127*  BUN 25* 18  --  16  CREATININE 1.26* 0.91 0.93 0.78  CALCIUM 9.0 8.4*  --  8.7*   GFR: Estimated Creatinine Clearance: 67.7 mL/min (by C-G formula based on SCr of 0.78 mg/dL). Liver Function Tests: Recent Labs  Lab 09/16/18 2203 09/18/18 1228  AST 30 43*  ALT 17 17  ALKPHOS 104 83  BILITOT 0.9 1.1  PROT 6.4* 5.3*  ALBUMIN 2.0* 1.7*   No results for input(s): LIPASE, AMYLASE in the last 168 hours. No results for input(s): AMMONIA in the last 168 hours. Coagulation Profile: Recent Labs  Lab 09/18/18 1228  INR 1.2   Cardiac Enzymes: No results for input(s): CKTOTAL, CKMB, CKMBINDEX, TROPONINI in the last 168 hours. BNP (last 3 results) No results for input(s): PROBNP in the last 8760 hours. HbA1C: No results for input(s): HGBA1C in the last 72 hours. CBG: No results for input(s): GLUCAP in the last 168 hours. Lipid Profile: No results for input(s): CHOL, HDL, LDLCALC, TRIG, CHOLHDL, LDLDIRECT in the last 72 hours. Thyroid Function Tests: No results for input(s): TSH, T4TOTAL,  FREET4, T3FREE, THYROIDAB in the last 72 hours. Anemia Panel: No results for input(s): VITAMINB12, FOLATE, FERRITIN, TIBC, IRON, RETICCTPCT in the last 72 hours. Sepsis Labs: Recent Labs  Lab 09/18/18 1228 09/18/18 1635  LATICACIDVEN 1.5 1.1    Recent Results (from the past 240 hour(s))  SARS Coronavirus 2 (CEPHEID - Performed in [facility] hospital lab), Hosp Order     Status: None   Collection Time: 09/16/18 10:03 PM   Specimen: Nasopharyngeal Swab  Result Value Ref Range Status   SARS Coronavirus 2 NEGATIVE NEGATIVE Final    Comment: (NOTE) If result is NEGATIVE SARS-CoV-2 target nucleic acids are NOT DETECTED. The SARS-CoV-2 RNA is generally detectable in upper and lower  respiratory specimens during the acute phase of infection. The lowest  concentration of SARS-CoV-2 viral copies this assay can detect is 250  copies / mL. A negative result does not preclude SARS-CoV-2 infection  and should not be used as the sole basis for treatment or other  patient management decisions.  A negative result may occur with  improper specimen collection / handling, submission of specimen other  than nasopharyngeal swab, presence of viral mutation(s) within the  areas targeted by this assay, and inadequate number of viral copies  (<250 copies / mL). A negative result must be combined with clinical  observations, patient history, and epidemiological information. If result is POSITIVE SARS-CoV-2 target nucleic acids are DETECTED. The SARS-CoV-2 RNA is generally detectable in upper and lower  respiratory specimens dur ing the acute phase of infection.  Positive  results are indicative of active infection with SARS-CoV-2.  Clinical  correlation with patient history and other diagnostic information is  necessary to determine patient infection status.  Positive results do  not rule out bacterial infection or co-infection with other viruses. If result is PRESUMPTIVE POSTIVE SARS-CoV-2 nucleic  acids MAY BE PRESENT.   A presumptive positive result was obtained on the submitted specimen  and confirmed on repeat testing.  While 2019 novel coronavirus  (SARS-CoV-2) nucleic acids may be present in the submitted sample  additional confirmatory testing may be necessary for epidemiological  and / or clinical management purposes  to differentiate between  SARS-CoV-2 and other Sarbecovirus currently known to infect humans.  If clinically indicated additional testing with an alternate test  methodology [phone]) is advised. The SARS-CoV-2 RNA is generally  detectable in upper and lower respiratory sp ecimens during the acute  phase of infection. The expected result is Negative. Fact Sheet for Patients:  StrictlyIdeas.no Fact Sheet for Healthcare Providers: BankingDealers.co.za This test is not yet approved or cleared by the [place] FDA and has been authorized for detection and/or diagnosis of SARS-CoV-2 by FDA under an Emergency Use Authorization (EUA).  This EUA will remain in effect (meaning this test can be used) for the duration of the COVID-19 declaration under Section 564(b)(1) of the Act, 21 U.S.C. section 360bbb-3(b)(1), unless the authorization is terminated or revoked sooner. Performed at [facility], [place] [address], [place], [place] [address]   SARS Coronavirus 2 (CEPHEID - Performed in [place] hospital lab), Hosp Order     Status: None   Collection Time: 09/18/18  2:47 PM   Specimen: Nasopharyngeal Swab  Result Value Ref Range Status   SARS Coronavirus 2 NEGATIVE NEGATIVE Final    Comment: (NOTE) If result is NEGATIVE SARS-CoV-2 target nucleic acids are NOT DETECTED. The SARS-CoV-2 RNA is generally detectable in upper and lower  respiratory specimens during the acute phase of infection. The lowest  concentration of SARS-CoV-2 viral copies this assay can detect is 250  copies / mL. A negative result does  not preclude SARS-CoV-2 infection  and should not be used as the sole basis for treatment or other  patient management decisions.  A negative result may occur with  improper specimen collection / handling, submission of specimen other  than nasopharyngeal swab, presence of viral mutation(s) within the  areas targeted by this assay, and inadequate number of viral copies  (<250 copies / mL). A negative result must be combined with clinical  observations, patient history, and epidemiological information. If result is POSITIVE SARS-CoV-2 target nucleic acids are DETECTED. The SARS-CoV-2 RNA is generally detectable in upper and lower  respiratory specimens dur ing the acute phase of infection.  Positive  results are indicative of active infection with SARS-CoV-2.  Clinical  correlation with patient history and other diagnostic information is  necessary to determine patient infection status.  Positive results do  not rule out bacterial infection or co-infection with other viruses. If result is PRESUMPTIVE POSTIVE SARS-CoV-2 nucleic acids MAY BE PRESENT.   A presumptive positive result was obtained on the  submitted specimen  and confirmed on repeat testing.  While 2019 novel coronavirus  (SARS-CoV-2) nucleic acids may be present in the submitted sample  additional confirmatory testing may be necessary for epidemiological  and / or clinical management purposes  to differentiate between  SARS-CoV-2 and other Sarbecovirus currently known to infect humans.  If clinically indicated additional testing with an alternate test  methodology [phone]) is advised. The SARS-CoV-2 RNA is generally  detectable in upper and lower respiratory sp ecimens during the acute  phase of infection. The expected result is Negative. Fact Sheet for Patients:  StrictlyIdeas.no Fact Sheet for Healthcare Providers: BankingDealers.co.za This test is not yet approved or  cleared by the [place] FDA and has been authorized for detection and/or diagnosis of SARS-CoV-2 by FDA under an Emergency Use Authorization (EUA).  This EUA will remain in effect (meaning this test can be used) for the duration of the COVID-19 declaration under Section 564(b)(1) of the Act, 21 U.S.C. section 360bbb-3(b)(1), unless the authorization is terminated or revoked sooner. Performed at [facility], [place] [address] [place], [place] [address]          Radiology Studies: Dg Chest [facility] 1 View  Result Date: 09/18/2018 CLINICAL DATA:  Shortness of breath, fever and pneumonia. EXAM: PORTABLE CHEST 1 VIEW COMPARISON:  September 16, 2018 FINDINGS: The cardiac silhouette is stably enlarged. Calcific atherosclerotic disease of the aorta. Upper lobe predominant emphysema. No significant change in patchy airspace consolidation in the right mid and lower lung. Minimal peribronchial airspace consolidation the left lower lung. Osseous structures are without acute abnormality. Soft tissues are grossly normal. IMPRESSION: 1. No significant change in the patchy airspace consolidation in the right mid and lower lung. 2. Minimal peribronchial airspace consolidation in the left lower lung. Electronically Signed   By: Fidela Salisbury M.D.   On: 09/18/2018 12:48        Scheduled Meds: . bromocriptine  2.5 mg Oral BID  . calcium-vitamin D  1 tablet Oral BID  . enoxaparin (LOVENOX) injection  40 mg Subcutaneous Q24H  . folic acid  2 mg Oral Daily  . hydrocortisone sod succinate (SOLU-CORTEF) inj  50 mg Intravenous Q6H  . levothyroxine  137 mcg Oral Q0600  . [START ON 09/20/2018] methotrexate  10 mg Oral Once per day on Sun Sat  . nystatin  5 mL Oral QID  . pantoprazole  40 mg Oral BID  . pravastatin  80 mg Oral Daily   Continuous Infusions: . azithromycin 500 mg (09/18/18 1737)  . ceFEPime (MAXIPIME) IV 2 g (09/18/18 2354)  . lactated ringers 100 mL/hr at 09/18/18 2347  .  vancomycin       LOS: 1 day        Aline August, MD [facility] 09/19/2018, 9:55 AM

## 2018-09-19 NOTE — Progress Notes (Addendum)
MD notified of pt's O2 sats maintaining 78% on 3L via [place]. Pt resting comfortably in no apparent distress. RN increased pt's O2 to 5 L & pt's O2 sats went up to the highest of 85%. Pt placed on 10 L via HFNC with pt's O2 sat ranging from 92-95 %. RT notified as well. New orders for bipap. Bipap is at bedside. Pt currently with O2 sats ranging from 95-100% on HFNC on 10L. Pt currently on 6 L via HFNC with his sats ranging from 92%-95. Will continue to monitor the pt. Hoover Brunette, RN

## 2018-09-19 NOTE — Evaluation (Signed)
Clinical/Bedside Swallow Evaluation Patient Details  Name: Ryan Coffey MRN: [id number] Date of Birth: November 04, 1932  Today's Date: 09/19/2018 Time: SLP Start Time (ACUTE ONLY): 37 SLP Stop Time (ACUTE ONLY): 1150 SLP Time Calculation (min) (ACUTE ONLY): 20 min  Past Medical History:  Past Medical History:  Diagnosis Date  . Arthritis   . Benign tumor of pituitary gland ([place])    "wasn't able to get it all"  . COPD (chronic obstructive pulmonary disease) ([place])   . Pneumonia 06/27/11   "first time"  . Shingles    Past Surgical History:  Past Surgical History:  Procedure Laterality Date  . CHOLECYSTECTOMY OPEN  2010  . KIDNEY SURGERY  2011   "lesion removed right side"  . RENAL BIOPSY  2012   right  . TRANSPHENOIDAL / TRANSNASAL HYPOPHYSECTOMY / RESECTION PITUITARY TUMOR  ~ 1979   "they couldn't get it all"   HPI:  Ryan Coffey is an 85 y.o. male with past medical history of renal cell carcinoma in remission, COPD,  pituitary tumor, hyperlipidemia, rheumatoid arthritis on MTX who states he was in his usual state of health until June 28 when he was given an inhaler (Symbicort) by his pulmonologist.  Patient notes that he almost immediately developed pain in the back of his throat and a coated tongue.  He states he has been unable to eat since then due to sore throat.  He was treated with nystatin by his PCP.  However notes his p.o. intake has decreased significantly and he feels that this is led to weakness.  Patient presented to the ED 2 days ago for treatment of weakness where he was found to have a right lower lobe infiltrate as well as some mild renal insufficiency.  He was recommended to be admitted however after receiving some IV fluid resuscitation patient chose to go home.  He was discharged with a prescription for Levaquin which he states he did not fill.  According to his daughter he told them that he had been discharged and they did not know that he had pneumonia.  Patient  now comes back in today with even worse weakness than he had previously.  Patient notes that he usually is able to get about his house without difficulty however over the past couple of days he has had difficulty just even walking to the kitchen due to weakness.  Patient denies shortness of breath per se.  He attributes all of his decreased functionality to weakness.  No chest pain.  Patient does have a cough which is been present for 5 to 6 months.  He notes over the past 3 months it has been productive of a greenish-whitish phlegm.  Patient denies orthopnea or PND.  No palpitations.  He thinks he might be dizzy but he is not sure.  He has not had any falls.  No nausea vomiting or diarrhea.  As noted above he has had significant decreased p.o. intake.  Past medical history is notable for admission for pneumonia in March 2020.  Patient states that he was able to make full recovery after that admission and he had been riding his bike and feeling well in the interim until 2 weeks ago as noted above.  Of note however repeat chest x-ray showed persistent but resolving right lower lobe infiltrates.  Patient was seen by Dr. Vaughan Browner of low our pulmonary on 626 and high-res CT was ordered which was done on 09/12/2018 which showed "extensive somewhat masslike consolidation of the dependent  right lung, centered about the superior segment of the right lower lobe although involving the right middle   Assessment / Plan / Recommendation Clinical Impression  Clinical swallowing evaluation was completed using thin liquids via spoon, cup and straw, pureed material and dry solids.  The patient only endorsed trouble swallowing related to pain. which has improved per his report.  He did not report any coughing or throat clearing with intake.  However, he does have a RLL PNA present per chest imaging.  Cranial nerve exam was completed and unremarkable.  Lingual, labial, facial and jaw range of motion and strength appeared adequate.   Sensation appeared to be intact.  He was noted to have patchy white material on his tongue that was unable to be removed despite effort which would be consistent with patient reported thrush.  His oral and pharyngeal swallow appeared to be functional.  Mastication of dry solids appeared adequate with no oral residue seen.  Swallow trigger appreciated to palpation with no overt s/s of aspiration observed.  In addition, he passed the [facility] Swallowing Protocol suggesting the low likelihood for aspiration.  Recommend that he continue on a regular diet with thin liquids.  ST will follow up for therapeutic diet tolerance.  Consider MBS if patient's lungs are not clearing as anticipated.   SLP Visit Diagnosis: Dysphagia, unspecified (R13.10)    Aspiration Risk  No limitations    Diet Recommendation   Regular with thin liquids  Medication Administration: Whole meds with liquid    Other  Recommendations Oral Care Recommendations: Oral care BID   Follow up Recommendations Other (comment)(TBD)      Frequency and Duration min 2x/week  2 weeks           Swallow Study   General Date of Onset: 09/18/18 HPI: Ryan Coffey is an 85 y.o. male with past medical history of renal cell carcinoma in remission, COPD,  pituitary tumor, hyperlipidemia, rheumatoid arthritis on MTX who states he was in his usual state of health until June 28 when he was given an inhaler (Symbicort) by his pulmonologist.  Patient notes that he almost immediately developed pain in the back of his throat and a coated tongue.  He states he has been unable to eat since then due to sore throat.  He was treated with nystatin by his PCP.  However notes his p.o. intake has decreased significantly and he feels that this is led to weakness.  Patient presented to the ED 2 days ago for treatment of weakness where he was found to have a right lower lobe infiltrate as well as some mild renal insufficiency.  He was recommended to be admitted however  after receiving some IV fluid resuscitation patient chose to go home.  He was discharged with a prescription for Levaquin which he states he did not fill.  According to his daughter he told them that he had been discharged and they did not know that he had pneumonia.  Patient now comes back in today with even worse weakness than he had previously.  Patient notes that he usually is able to get about his house without difficulty however over the past couple of days he has had difficulty just even walking to the kitchen due to weakness.  Patient denies shortness of breath per se.  He attributes all of his decreased functionality to weakness.  No chest pain.  Patient does have a cough which is been present for 5 to 6 months.  He notes over  the past 3 months it has been productive of a greenish-whitish phlegm.  Patient denies orthopnea or PND.  No palpitations.  He thinks he might be dizzy but he is not sure.  He has not had any falls.  No nausea vomiting or diarrhea.  As noted above he has had significant decreased p.o. intake.  Past medical history is notable for admission for pneumonia in March 2020.  Patient states that he was able to make full recovery after that admission and he had been riding his bike and feeling well in the interim until 2 weeks ago as noted above.  Of note however repeat chest x-ray showed persistent but resolving right lower lobe infiltrates.  Patient was seen by Dr. Vaughan Browner of low our pulmonary on 626 and high-res CT was ordered which was done on 09/12/2018 which showed "extensive somewhat masslike consolidation of the dependent right lung, centered about the superior segment of the right lower lobe although involving the right middle Type of Study: Bedside Swallow Evaluation Previous Swallow Assessment: None noted at [facility]. Diet Prior to this Study: Regular;Thin liquids Temperature Spikes Noted: Yes History of Recent Intubation: No Behavior/Cognition: Alert;Cooperative;Pleasant mood Oral  Cavity Assessment: Other (comment)(white patches consistent with known thrush) Oral Care Completed by SLP: No Oral Cavity - Dentition: Missing dentition Vision: Functional for self-feeding Self-Feeding Abilities: Able to feed self Patient Positioning: Upright in bed Baseline Vocal Quality: Normal Volitional Swallow: Able to elicit    Oral/Motor/Sensory Function Overall Oral Motor/Sensory Function: Within functional limits   Ice Chips Ice chips: Not tested   Thin Liquid Thin Liquid: Within functional limits Presentation: Cup;Self Fed;Spoon;Straw    Nectar Thick Nectar Thick Liquid: Not tested   Honey Thick Honey Thick Liquid: Not tested   Puree Puree: Within functional limits Presentation: Self Fed;Spoon   Solid     Solid: Within functional limits Presentation: [place], [place], [employer] [facility] SLP [phone]  Lamar Sprinkles 09/19/2018,1:21 PM

## 2018-09-20 LAB — COMPREHENSIVE METABOLIC PANEL
ALT: 19 U/L (ref 0–44)
AST: 29 U/L (ref 15–41)
Albumin: 1.5 g/dL — ABNORMAL LOW (ref 3.5–5.0)
Alkaline Phosphatase: 72 U/L (ref 38–126)
Anion gap: 7 (ref 5–15)
BUN: 21 mg/dL (ref 8–23)
CO2: 28 mmol/L (ref 22–32)
Calcium: 8.9 mg/dL (ref 8.9–10.3)
Chloride: 101 mmol/L (ref 98–111)
Creatinine, Ser: 0.66 mg/dL (ref 0.61–1.24)
GFR calc Af Amer: >60 mL/min (ref >60)
GFR calc non Af Amer: >60 mL/min (ref >60)
Glucose, Bld: 126 mg/dL — ABNORMAL HIGH (ref 70–99)
Potassium: 4.3 mmol/L (ref 3.5–5.1)
Sodium: 136 mmol/L (ref 135–145)
Total Bilirubin: 0.5 mg/dL (ref 0.3–1.2)
Total Protein: 4.7 g/dL — ABNORMAL LOW (ref 6.5–8.1)

## 2018-09-20 LAB — CBC WITH DIFFERENTIAL/PLATELET
Abs Immature Granulocytes: 0.27 10*3/uL — ABNORMAL HIGH (ref 0.00–0.07)
Basophils Absolute: 0 10*3/uL (ref 0.0–0.1)
Basophils Relative: 0 %
Eosinophils Absolute: 0 10*3/uL (ref 0.0–0.5)
Eosinophils Relative: 0 %
HCT: 33.1 % — ABNORMAL LOW (ref 39.0–52.0)
Hemoglobin: 10.7 g/dL — ABNORMAL LOW (ref 13.0–17.0)
Immature Granulocytes: 1 %
Lymphocytes Relative: 3 %
Lymphs Abs: 0.7 10*3/uL (ref 0.7–4.0)
MCH: 30.6 pg (ref 26.0–34.0)
MCHC: 32.3 g/dL (ref 30.0–36.0)
MCV: 94.6 fL (ref 80.0–100.0)
Monocytes Absolute: 0.6 10*3/uL (ref 0.1–1.0)
Monocytes Relative: 3 %
Neutro Abs: 19 10*3/uL — ABNORMAL HIGH (ref 1.7–7.7)
Neutrophils Relative %: 93 %
Platelets: 415 10*3/uL — ABNORMAL HIGH (ref 150–400)
RBC: 3.5 MIL/uL — ABNORMAL LOW (ref 4.22–5.81)
RDW: 15.8 % — ABNORMAL HIGH (ref 11.5–15.5)
WBC: 20.5 10*3/uL — ABNORMAL HIGH (ref 4.0–10.5)
nRBC: 0 % (ref 0.0–0.2)

## 2018-09-20 LAB — MAGNESIUM: Magnesium: 1.6 mg/dL — ABNORMAL LOW (ref 1.7–2.4)

## 2018-09-20 MED ORDER — HYDROCORTISONE 10 MG PO TABS
10.0000 mg | ORAL_TABLET | Freq: Every day | ORAL | Status: DC
  Administered 2018-09-20 – 2018-09-24 (×5): 10 mg via ORAL
  Filled 2018-09-20 (×6): qty 1

## 2018-09-20 MED ORDER — MAGNESIUM SULFATE 2 GM/50ML IV SOLN
2.0000 g | Freq: Once | INTRAVENOUS | Status: AC
  Administered 2018-09-20: 12:00:00 2 g via INTRAVENOUS
  Filled 2018-09-20: qty 50

## 2018-09-20 MED ORDER — ENSURE ENLIVE PO LIQD
237.0000 mL | Freq: Two times a day (BID) | ORAL | Status: DC
  Administered 2018-09-21 – 2018-09-25 (×6): 237 mL via ORAL

## 2018-09-20 MED ORDER — IPRATROPIUM-ALBUTEROL 0.5-2.5 (3) MG/3ML IN SOLN
3.0000 mL | Freq: Four times a day (QID) | RESPIRATORY_TRACT | Status: DC
  Administered 2018-09-20 – 2018-09-24 (×16): 3 mL via RESPIRATORY_TRACT
  Filled 2018-09-20 (×16): qty 3

## 2018-09-20 MED ORDER — ACETAMINOPHEN 325 MG PO TABS
650.0000 mg | ORAL_TABLET | Freq: Four times a day (QID) | ORAL | Status: DC | PRN
  Administered 2018-09-21 – 2018-09-23 (×4): 650 mg via ORAL
  Filled 2018-09-20 (×3): qty 2

## 2018-09-20 MED ORDER — HYDROCORTISONE 10 MG PO TABS: 10.0000 mg | ORAL_TABLET | Freq: Every day | ORAL | Status: DC

## 2018-09-20 MED ORDER — HYDROCORTISONE 20 MG PO TABS
20.0000 mg | ORAL_TABLET | Freq: Every day | ORAL | Status: DC
  Administered 2018-09-20 – 2018-09-25 (×6): 20 mg via ORAL
  Filled 2018-09-20 (×7): qty 1

## 2018-09-20 MED ORDER — ZOLPIDEM TARTRATE 5 MG PO TABS
5.0000 mg | ORAL_TABLET | Freq: Once | ORAL | Status: AC
  Administered 2018-09-20: 5 mg via ORAL
  Filled 2018-09-20: qty 1

## 2018-09-20 NOTE — Progress Notes (Addendum)
Pt O2 saturation at 2 L now at 95 %. Will continue to monitor  Attempting to wean off pt from Hi flow O2 at 6 L . Pt  O2 decreased to 4 L with saturation in the mid 90"s. Pt currently on 2 L of O2 per nasal cannula with O2 saturation at  88%. Will increase back to 3 L and evaluate .

## 2018-09-20 NOTE — Progress Notes (Signed)
Initial Nutrition Assessment  DOCUMENTATION CODES:  Not applicable  INTERVENTION:  Ensure Enlive po BID, each supplement provides 350 kcal and 20 grams of protein  Pt reports his appetite/oral intake has quickly improved since admission.   NUTRITION DIAGNOSIS:  Inadequate oral intake related to Painful mouth/throat as evidenced by per patient/family report.  GOAL:  Patient will meet greater than or equal to 90% of their needs  MONITOR:  PO intake, Weight trends, Supplement acceptance, Labs, I & O's, Skin  REASON FOR ASSESSMENT:  Consult Assessment of nutrition requirement/status  ASSESSMENT:  85 y/o male PMHx RCC (in remission), pituitary tumor, COPD, HLD, RA. Acutely developed throat pain several weeks ago after using a new inhaler- Symbicort. Reportedly has been unable to eat since that time and has become weak. Presented to ED 7/14 and was found to have pna, but refused admission. Returned 7/16 w/ worsening symptoms and agreed to admission.    RD operating remotely d/t COVID precautions. Talking with patient over phone was difficult as he is somewhat [facility] and just spoke over RD most of the time.   Pt reports he experienced severe pain in his mouth after using the symbicort several weeks ago. Was unable to gather any further intake information or weight history from patient. Per chart, pt was admitted at 155.3 lbs. It appears his UBW is ~165 lbs. There is insufficient documentation to discern whether he has lost weight or not.   Pt did say that, at this time, he says his intake is improving. He feels his strength returning. He is agreeable to Ensure to supplement his oral intake.   Labs: Albumin: 1.5, WBC:20.5, Hgb: 10.7, Mag: 1.6. Renal labs WDL Meds: Methotrexate, folate, Calcium w/ D, Hydrocortisone, Nystatin, PPI,   Recent Labs  Lab 09/18/18 1228 09/18/18 1635 09/19/18 0503 09/20/18 0320  NA 133*  --  135 136  K 3.9  --  4.3 4.3  CL 96*  --  101 101  CO2 24  --  27 28   BUN 18  --  16 21  CREATININE 0.91 0.93 0.78 0.66  CALCIUM 8.4*  --  8.7* 8.9  MG  --   --   --  1.6*  GLUCOSE 94  --  127* 126*   NUTRITION - FOCUSED PHYSICAL EXAM: Unable to conduct  Diet Order:   Diet Order            Diet regular Room service appropriate? Yes; Fluid consistency: Thin  Diet effective now             EDUCATION NEEDS:  No education needs have been identified at this time  Skin:  Pressure Ulcer Stage I: Medial Coccyx  Last BM:  7/17  Height:  Ht Readings from Last 1 Encounters:  09/18/18 5\' 8"  (1.727 m)   Weight:  Wt Readings from Last 1 Encounters:  09/20/18 72.3 kg   Wt Readings from Last 10 Encounters:  09/20/18 72.3 kg  09/16/18 72.6 kg  08/29/18 69.3 kg  07/30/18 68.9 kg  05/18/18 79.4 kg  02/05/18 74.6 kg  08/28/17 75.3 kg  04/01/17 75.3 kg  12/31/16 73.9 kg  08/29/16 74.8 kg   Ideal Body Weight:  70 kg  BMI:  Body mass index is 24.24 kg/m.  Estimated Nutritional Needs:  Kcal:  1800-2000 kcals (25-28 kcal/kg bw) Protein:  94-108g Pro (1.3-1.5g/kg bw) Fluid:  >1.8 L fluid (25 ml/kg bw)  Burtis Junes RD, LDN, CNSC Clinical Nutrition Available Tues-Sat via Pager: [phone] 09/20/2018  6:26 PM

## 2018-09-20 NOTE — Progress Notes (Signed)
Patient requesting Ambien, has taken nightly the past two nights.  RN paged [facility] requesting a PRN order for Ambien.

## 2018-09-20 NOTE — Progress Notes (Signed)
Patient ID: Ryan Coffey, male   DOB: 1932-08-21, 85 y.o.   MRN: [id number]  PROGRESS NOTE    Ryan Coffey  WUJ:[id number] DOB: 1932-03-17 DOA: 09/18/2018 PCP: Prince Solian, MD   Brief Narrative:  85 year old male with history of renal cell carcinoma in remission, COPD, pediatric tumor, hyperlipidemia, rheumatoid arthritis on methotrexate, pneumonia in March 2020 who presented to the ED 2 days ago for treatment of weakness and was found to have right lower lobe infiltrate but patient was discharged home on oral Levaquin as per patient's request.  Patient presented again on 09/18/2018 with worsening weakness, cough, very poor appetite and difficulty swallowing.  He had a recent high-resolution CT chest done on 09/12/2018 as an outpatient as per Dr. Matilde Bash request which showed extensive somewhat masslike consolidation of the dependent right lung, centered about the superior segment of the right lower lobe although involving the right middle lobe as well.  Findings likely reflected infection or aspiration, although underlying mass is not excluded.  He was found to be febrile in the ED along with hypoxia and leukocytosis.  Patient was started on broad-spectrum antibiotics.  Assessment & Plan:   Acute hypoxic respiratory failure Probable healthcare associated pneumonia with concern for gram-negative pneumonia versus MRSA pneumonia presenting with multilobar right-sided pneumonia -Currently still on 6 L high flow nasal cannula.  Wean off as able.  Does not use oxygen at home.   -Currently on broad-spectrum antibiotics.  Will DC vancomycin.  Strep pneumo urinary antigen negative as well.  Awaiting urine Legionella antigen.  Cultures negative so far.  Rapid COVID testing negative. -Incentive spirometry. -Diet as per SLP recommendations. -If symptoms persist after completion of treatment with antibiotics, patient will need pulmonary evaluation and probable bronchoscopy to rule out malignancy.   Patient was recently evaluated by pulmonary/Dr. Vaughan Browner as an outpatient who had ordered the high-resolution CT.  Patient will need follow-up with pulmonary as an outpatient.  I have notified Dr. Vaughan Browner of the same.  Leukocytosis -Probably from above.  Slightly improving.  Monitor.  Hyponatremia -Probably from decreased p.o. intake.  Sodium improving.  DC IV fluids.  Encourage oral intake  Hypomagnesemia -Replace.  Repeat a.m. labs  Panhypopituitarism secondary to pituitary adenoma -Currently on stress dose steroids.  Will quickly taper it off and put him back on his home regimen. -Continue bromocriptine  Rheumatoid arthritis  -Continue methotrexate.  Outpatient follow-up  Hypothyroidism -Continue Synthroid  Hypoalbuminemia -Due to poor recent oral intake.  Nutrition consult   DVT prophylaxis: Lovenox Code Status: Full Family Communication: Spoke to daughter/Ryan Coffey on phone on 09/20/2018. Disposition Plan: Depends on clinical outcome.  Might need rehab.  Consultants: None  Procedures: None  Antimicrobials:  Cefepime, vancomycin and Zithromax from 09/18/2018 onwards  Subjective: Patient seen and examined at bedside.  Feels slightly better.  No overnight fever or vomiting.  Still feels short of breath with exertion.  No worsening cough.  No chest pains.  Objective: Vitals:   09/19/18 0425 09/19/18 1344 09/19/18 2155 09/20/18 0530  BP: 115/72 107/89 116/70 129/75  Pulse: 72 87 83 84  Resp: (!) 24 18 16 19   Temp: 97.6 F (36.4 C) 98.4 F (36.9 C) 97.9 F (36.6 C) 97.7 F (36.5 C)  TempSrc: Oral Oral Oral Oral  SpO2: 93% 100% 100% 99%  Weight: 70.5 kg   72.3 kg  Height:        Intake/Output Summary (Last 24 hours) at 09/20/2018 0820 Last data filed at 09/20/2018 0400 Gross per 24 hour  Intake --  Output 350 ml  Net -350 ml   Filed Weights   09/18/18 1214 09/19/18 0425 09/20/18 0530  Weight: 68 kg 70.5 kg 72.3 kg    Examination:  General exam: Appears calm  and comfortable.  Looks very thinly built.  No acute distress Respiratory system: Bilateral decreased breath sounds at bases with scattered crackles, mostly on the right side.  No wheezing Cardiovascular system: Rate controlled, S1-S2 heard Gastrointestinal system: Abdomen is nondistended, soft and nontender. Normal bowel sounds heard. Extremities: No edema or cyanosis Central nervous system: Alert and oriented. No focal neurological deficits. Moving extremities Skin: No rashes, petechiae  psychiatry: Judgement and insight appear normal. Mood & affect appropriate.     Data Reviewed: I have personally reviewed following labs and imaging studies  CBC: Recent Labs  Lab 09/16/18 2203 09/18/18 1228 09/18/18 1635 09/19/18 0503 09/20/18 0320  WBC 19.3* 20.5* 21.9* 21.9* 20.5*  NEUTROABS 17.4* 18.8*  --   --  19.0*  HGB 12.8* 11.4* 11.3* 11.6* 10.7*  HCT 39.7 35.0* 34.8* 37.3* 33.1*  MCV 94.7 92.8 93.8 95.9 94.6  PLT 568* 420* 377 413* 998*   Basic Metabolic Panel: Recent Labs  Lab 09/16/18 2203 09/18/18 1228 09/18/18 1635 09/19/18 0503 09/20/18 0320  NA 131* 133*  --  135 136  K 3.9 3.9  --  4.3 4.3  CL 93* 96*  --  101 101  CO2 25 24  --  27 28  GLUCOSE 142* 94  --  127* 126*  BUN 25* 18  --  16 21  CREATININE 1.26* 0.91 0.93 0.78 0.66  CALCIUM 9.0 8.4*  --  8.7* 8.9  MG  --   --   --   --  1.6*   GFR: Estimated Creatinine Clearance: 67.7 mL/min (by C-G formula based on SCr of 0.66 mg/dL). Liver Function Tests: Recent Labs  Lab 09/16/18 2203 09/18/18 1228 09/20/18 0320  AST 30 43* 29  ALT 17 17 19   ALKPHOS 104 83 72  BILITOT 0.9 1.1 0.5  PROT 6.4* 5.3* 4.7*  ALBUMIN 2.0* 1.7* 1.5*   No results for input(s): LIPASE, AMYLASE in the last 168 hours. No results for input(s): AMMONIA in the last 168 hours. Coagulation Profile: Recent Labs  Lab 09/18/18 1228  INR 1.2   Cardiac Enzymes: No results for input(s): CKTOTAL, CKMB, CKMBINDEX, TROPONINI in the last  168 hours. BNP (last 3 results) No results for input(s): PROBNP in the last 8760 hours. HbA1C: No results for input(s): HGBA1C in the last 72 hours. CBG: No results for input(s): GLUCAP in the last 168 hours. Lipid Profile: No results for input(s): CHOL, HDL, LDLCALC, TRIG, CHOLHDL, LDLDIRECT in the last 72 hours. Thyroid Function Tests: No results for input(s): TSH, T4TOTAL, FREET4, T3FREE, THYROIDAB in the last 72 hours. Anemia Panel: No results for input(s): VITAMINB12, FOLATE, FERRITIN, TIBC, IRON, RETICCTPCT in the last 72 hours. Sepsis Labs: Recent Labs  Lab 09/18/18 1228 09/18/18 1635  LATICACIDVEN 1.5 1.1    Recent Results (from the past 240 hour(s))  SARS Coronavirus 2 (CEPHEID - Performed in [facility] hospital lab), Hosp Order     Status: None   Collection Time: 09/16/18 10:03 PM   Specimen: Nasopharyngeal Swab  Result Value Ref Range Status   SARS Coronavirus 2 NEGATIVE NEGATIVE Final    Comment: (NOTE) If result is NEGATIVE SARS-CoV-2 target nucleic acids are NOT DETECTED. The SARS-CoV-2 RNA is generally detectable in upper and lower  respiratory specimens during  the acute phase of infection. The lowest  concentration of SARS-CoV-2 viral copies this assay can detect is 250  copies / mL. A negative result does not preclude SARS-CoV-2 infection  and should not be used as the sole basis for treatment or other  patient management decisions.  A negative result may occur with  improper specimen collection / handling, submission of specimen other  than nasopharyngeal swab, presence of viral mutation(s) within the  areas targeted by this assay, and inadequate number of viral copies  (<250 copies / mL). A negative result must be combined with clinical  observations, patient history, and epidemiological information. If result is POSITIVE SARS-CoV-2 target nucleic acids are DETECTED. The SARS-CoV-2 RNA is generally detectable in upper and lower  respiratory specimens  dur ing the acute phase of infection.  Positive  results are indicative of active infection with SARS-CoV-2.  Clinical  correlation with patient history and other diagnostic information is  necessary to determine patient infection status.  Positive results do  not rule out bacterial infection or co-infection with other viruses. If result is PRESUMPTIVE POSTIVE SARS-CoV-2 nucleic acids MAY BE PRESENT.   A presumptive positive result was obtained on the submitted specimen  and confirmed on repeat testing.  While 2019 novel coronavirus  (SARS-CoV-2) nucleic acids may be present in the submitted sample  additional confirmatory testing may be necessary for epidemiological  and / or clinical management purposes  to differentiate between  SARS-CoV-2 and other Sarbecovirus currently known to infect humans.  If clinically indicated additional testing with an alternate test  methodology [phone]) is advised. The SARS-CoV-2 RNA is generally  detectable in upper and lower respiratory sp ecimens during the acute  phase of infection. The expected result is Negative. Fact Sheet for Patients:  StrictlyIdeas.no Fact Sheet for Healthcare Providers: BankingDealers.co.za This test is not yet approved or cleared by the [place] FDA and has been authorized for detection and/or diagnosis of SARS-CoV-2 by FDA under an Emergency Use Authorization (EUA).  This EUA will remain in effect (meaning this test can be used) for the duration of the COVID-19 declaration under Section 564(b)(1) of the Act, 21 U.S.C. section 360bbb-3(b)(1), unless the authorization is terminated or revoked sooner. Performed at [facility], [place] [address] [place], [place] [address]   Blood Culture (routine x 2)     Status: None (Preliminary result)   Collection Time: 09/18/18 12:35 PM   Specimen: BLOOD RIGHT FOREARM  Result Value Ref Range Status   Specimen Description BLOOD  RIGHT FOREARM  Final   Special Requests   Final    BOTTLES DRAWN AEROBIC AND ANAEROBIC Blood Culture adequate volume   Culture   Final    NO GROWTH 1 DAY Performed at [facility], [place] [address] [place], [place] [address]    Report Status PENDING  Incomplete  Blood Culture (routine x 2)     Status: None (Preliminary result)   Collection Time: 09/18/18 12:49 PM   Specimen: BLOOD LEFT FOREARM  Result Value Ref Range Status   Specimen Description BLOOD LEFT FOREARM  Final   Special Requests   Final    BOTTLES DRAWN AEROBIC AND ANAEROBIC Blood Culture adequate volume   Culture   Final    NO GROWTH 1 DAY Performed at [facility], [place] [address] [place], [place] [address]    Report Status PENDING  Incomplete  SARS Coronavirus 2 (CEPHEID - Performed in [facility] hospital lab), [facility]  Status: None   Collection Time: 09/18/18  2:47 PM   Specimen: Nasopharyngeal Swab  Result Value Ref Range Status   SARS Coronavirus 2 NEGATIVE NEGATIVE Final    Comment: (NOTE) If result is NEGATIVE SARS-CoV-2 target nucleic acids are NOT DETECTED. The SARS-CoV-2 RNA is generally detectable in upper and lower  respiratory specimens during the acute phase of infection. The lowest  concentration of SARS-CoV-2 viral copies this assay can detect is 250  copies / mL. A negative result does not preclude SARS-CoV-2 infection  and should not be used as the sole basis for treatment or other  patient management decisions.  A negative result may occur with  improper specimen collection / handling, submission of specimen other  than nasopharyngeal swab, presence of viral mutation(s) within the  areas targeted by this assay, and inadequate number of viral copies  (<250 copies / mL). A negative result must be combined with clinical  observations, patient history, and epidemiological information. If result is POSITIVE SARS-CoV-2 target nucleic acids are DETECTED. The SARS-CoV-2 RNA is  generally detectable in upper and lower  respiratory specimens dur ing the acute phase of infection.  Positive  results are indicative of active infection with SARS-CoV-2.  Clinical  correlation with patient history and other diagnostic information is  necessary to determine patient infection status.  Positive results do  not rule out bacterial infection or co-infection with other viruses. If result is PRESUMPTIVE POSTIVE SARS-CoV-2 nucleic acids MAY BE PRESENT.   A presumptive positive result was obtained on the submitted specimen  and confirmed on repeat testing.  While 2019 novel coronavirus  (SARS-CoV-2) nucleic acids may be present in the submitted sample  additional confirmatory testing may be necessary for epidemiological  and / or clinical management purposes  to differentiate between  SARS-CoV-2 and other Sarbecovirus currently known to infect humans.  If clinically indicated additional testing with an alternate test  methodology [phone]) is advised. The SARS-CoV-2 RNA is generally  detectable in upper and lower respiratory sp ecimens during the acute  phase of infection. The expected result is Negative. Fact Sheet for Patients:  StrictlyIdeas.no Fact Sheet for Healthcare Providers: BankingDealers.co.za This test is not yet approved or cleared by the [place] FDA and has been authorized for detection and/or diagnosis of SARS-CoV-2 by FDA under an Emergency Use Authorization (EUA).  This EUA will remain in effect (meaning this test can be used) for the duration of the COVID-19 declaration under Section 564(b)(1) of the Act, 21 U.S.C. section 360bbb-3(b)(1), unless the authorization is terminated or revoked sooner. Performed at [facility], [place] [address], [place], [place] [address]   MRSA PCR Screening     Status: None   Collection Time: 09/19/18  1:00 PM   Specimen: Nasal Mucosa; Nasopharyngeal  Result Value Ref  Range Status   MRSA by PCR NEGATIVE NEGATIVE Final    Comment:        The GeneXpert MRSA Assay (FDA approved for NASAL specimens only), is one component of a comprehensive MRSA colonization surveillance program. It is not intended to diagnose MRSA infection nor to guide or monitor treatment for MRSA infections. Performed at [facility], [place] [address], [place], [place] [address]          Radiology Studies: Dg Chest [facility] 1 View  Result Date: 09/18/2018 CLINICAL DATA:  Shortness of breath, fever and pneumonia. EXAM: PORTABLE CHEST 1 VIEW COMPARISON:  September 16, 2018 FINDINGS: The cardiac silhouette is stably enlarged.  Calcific atherosclerotic disease of the aorta. Upper lobe predominant emphysema. No significant change in patchy airspace consolidation in the right mid and lower lung. Minimal peribronchial airspace consolidation the left lower lung. Osseous structures are without acute abnormality. Soft tissues are grossly normal. IMPRESSION: 1. No significant change in the patchy airspace consolidation in the right mid and lower lung. 2. Minimal peribronchial airspace consolidation in the left lower lung. Electronically Signed   By: Fidela Salisbury M.D.   On: 09/18/2018 12:48        Scheduled Meds:  bromocriptine  2.5 mg Oral BID   calcium-vitamin D  1 tablet Oral BID   enoxaparin (LOVENOX) injection  40 mg Subcutaneous W86H   folic acid  2 mg Oral Daily   hydrocortisone sod succinate (SOLU-CORTEF) inj  25 mg Intravenous Q8H   levothyroxine  137 mcg Oral Q0600   methotrexate  10 mg Oral Once per day on Sun Sat   nystatin  5 mL Oral QID   pantoprazole  40 mg Oral BID   pravastatin  80 mg Oral Daily   Continuous Infusions:  azithromycin 500 mg (09/19/18 1729)   ceFEPime (MAXIPIME) IV 2 g (09/20/18 0129)   vancomycin 1,250 mg (09/19/18 1430)     LOS: 2 days        Aline August, MD [facility] 09/20/2018, 8:20 AM

## 2018-09-20 NOTE — Evaluation (Signed)
Physical Therapy Evaluation Patient Details Name: Ryan Coffey MRN: [id number] DOB: 1933/01/06 Today's Date: 09/20/2018   History of Present Illness  85 year old male with history of renal cell carcinoma in remission, COPD, pediatric tumor, hyperlipidemia, rheumatoid arthritis on methotrexate, pneumonia in March 2020 who presented to the ED 09/16/18  for treatment of weakness and was found to have right lower lobe infiltrate but patient was discharged home on oral Levaquin as per patient's request. Admitted 09/18/18 for hypoxia and leukocytosis secondary to PNA.   Clinical Impression  PTA pt reports living independently in multistory home with bed and bath on first floor. Pt reports working out 5-7 days per week in his home gym on the second floor. Pt reports everything changed June 28 when he took a dose of Symbacort which caused him mouth pain and difficulty swallowing. Since then he has had increasing weakness secondary to not being able to eat anything and not being able to exercise. Pt is currently limited in safe mobility by oxygen desaturation (see General Comments) as well as decreased strength and endurance. Pt is minA for bed mobility, and transfers and min guard for ambulation of 80 feet with RW. PT recommends HHPT level rehab as pt has poor insight on his deficits and would likely try to return to prior level of exercise too quickly. PT will continue to follow acutely.      Follow Up Recommendations Home health PT;Supervision - Intermittent    Equipment Recommendations  Rolling walker with 5" wheels    Recommendations for Other Services       Precautions / Restrictions Precautions Precautions: Fall Restrictions Weight Bearing Restrictions: No      Mobility  Bed Mobility Overal bed mobility: Needs Assistance Bed Mobility: Supine to Sit;Sit to Supine     Supine to sit: Min guard Sit to supine: Min assist   General bed mobility comments: min guard for safety with coming  to EoB, minA for bringing LE back into bed after ambulation  Transfers Overall transfer level: Needs assistance Equipment used: Rolling walker (2 wheeled) Transfers: Sit to/from Stand Sit to Stand: Min assist         General transfer comment: min A for power up and steadying, vc for hand placement  Ambulation/Gait Ambulation/Gait assistance: Min guard Gait Distance (Feet): 80 Feet Assistive device: Rolling walker (2 wheeled) Gait Pattern/deviations: Step-through pattern;Decreased step length - right;Decreased step length - left;WFL(Within Functional Limits) Gait velocity: slowed Gait velocity interpretation: 1.31 - 2.62 ft/sec, indicative of limited community ambulator General Gait Details: min guard for slow, steady gait        Balance Overall balance assessment: Mild deficits observed, not formally tested                                           Pertinent Vitals/Pain Pain Assessment: No/denies pain    Home Living Family/patient expects to be discharged to:: Private residence Living Arrangements: Alone   Type of Home: House Home Access: Stairs to enter Entrance Stairs-Rails: Right Entrance Stairs-Number of Steps: 2-3 Home Layout: Two level;Full bath on main level;Able to live on main level with bedroom/bathroom Home Equipment: Shower seat      Prior Function Level of Independence: Independent                  Extremity/Trunk Assessment   Upper Extremity Assessment Upper Extremity Assessment: Overall WFL for  tasks assessed    Lower Extremity Assessment Lower Extremity Assessment: Overall WFL for tasks assessed       Communication   Communication: No difficulties  Cognition Arousal/Alertness: Awake/alert Behavior During Therapy: WFL for tasks assessed/performed Overall Cognitive Status: Within Functional Limits for tasks assessed                                        General Comments General comments (skin  integrity, edema, etc.): Pt on 3L via HFNC on entry with SaO2 96%O2, with ambulation SaO2 dropped and pt requires 8L O2 to maintain SaO2 >90%O2        Assessment/Plan    PT Assessment Patient needs continued PT services  PT Problem List Decreased strength;Decreased activity tolerance;Cardiopulmonary status limiting activity;Decreased safety awareness       PT Treatment Interventions DME instruction;Gait training;Stair training;Functional mobility training;Therapeutic activities;Therapeutic exercise;Balance training;Cognitive remediation;Patient/family education    PT Goals (Current goals can be found in the Care Plan section)  Acute Rehab PT Goals Patient Stated Goal: go home PT Goal Formulation: With patient Time For Goal Achievement: 10/04/18 Potential to Achieve Goals: Fair    Frequency Min 3X/week   Barriers to discharge Decreased caregiver support         AM-PAC PT "6 Clicks" Mobility  Outcome Measure Help needed turning from your back to your side while in a flat bed without using bedrails?: None Help needed moving from lying on your back to sitting on the side of a flat bed without using bedrails?: A Little Help needed moving to and from a bed to a chair (including a wheelchair)?: A Little Help needed standing up from a chair using your arms (e.g., wheelchair or bedside chair)?: A Little Help needed to walk in hospital room?: A Little Help needed climbing 3-5 steps with a railing? : A Lot 6 Click Score: 18    End of Session Equipment Utilized During Treatment: Gait belt;Oxygen Activity Tolerance: Treatment limited secondary to medical complications (Comment)(oxygen desaturation ) Patient left: in bed;with call bell/phone within reach;with bed alarm set Nurse Communication: Mobility status PT Visit Diagnosis: Other abnormalities of gait and mobility (R26.89);Muscle weakness (generalized) (M62.81);Difficulty in walking, not elsewhere classified (R26.2)    Time:  3235-5732 PT Time Calculation (min) (ACUTE ONLY): 28 min   Charges:   PT Evaluation $PT Eval Moderate Complexity: 1 Mod PT Treatments $Gait Training: 8-22 mins        Iridessa Harrow B. Migdalia Dk PT, DPT Acute Rehabilitation Services Pager [phone] Office [phone]   [place] 09/20/2018, 5:20 PM

## 2018-09-21 LAB — CBC WITH DIFFERENTIAL/PLATELET
Abs Immature Granulocytes: 0.13 10*3/uL — ABNORMAL HIGH (ref 0.00–0.07)
Basophils Absolute: 0 10*3/uL (ref 0.0–0.1)
Basophils Relative: 0 %
Eosinophils Absolute: 0.1 10*3/uL (ref 0.0–0.5)
Eosinophils Relative: 0 %
HCT: 36.8 % — ABNORMAL LOW (ref 39.0–52.0)
Hemoglobin: 11.6 g/dL — ABNORMAL LOW (ref 13.0–17.0)
Immature Granulocytes: 1 %
Lymphocytes Relative: 5 %
Lymphs Abs: 1 10*3/uL (ref 0.7–4.0)
MCH: 29.9 pg (ref 26.0–34.0)
MCHC: 31.5 g/dL (ref 30.0–36.0)
MCV: 94.8 fL (ref 80.0–100.0)
Monocytes Absolute: 0.6 10*3/uL (ref 0.1–1.0)
Monocytes Relative: 3 %
Neutro Abs: 17.8 10*3/uL — ABNORMAL HIGH (ref 1.7–7.7)
Neutrophils Relative %: 91 %
Platelets: 522 10*3/uL — ABNORMAL HIGH (ref 150–400)
RBC: 3.88 MIL/uL — ABNORMAL LOW (ref 4.22–5.81)
RDW: 15.9 % — ABNORMAL HIGH (ref 11.5–15.5)
WBC: 19.7 10*3/uL — ABNORMAL HIGH (ref 4.0–10.5)
nRBC: 0 % (ref 0.0–0.2)

## 2018-09-21 LAB — URINE CULTURE: Culture: NO GROWTH

## 2018-09-21 LAB — BASIC METABOLIC PANEL
Anion gap: 9 (ref 5–15)
BUN: 16 mg/dL (ref 8–23)
CO2: 27 mmol/L (ref 22–32)
Calcium: 9 mg/dL (ref 8.9–10.3)
Chloride: 100 mmol/L (ref 98–111)
Creatinine, Ser: 0.73 mg/dL (ref 0.61–1.24)
GFR calc Af Amer: >60 mL/min (ref >60)
GFR calc non Af Amer: >60 mL/min (ref >60)
Glucose, Bld: 103 mg/dL — ABNORMAL HIGH (ref 70–99)
Potassium: 3.7 mmol/L (ref 3.5–5.1)
Sodium: 136 mmol/L (ref 135–145)

## 2018-09-21 LAB — LEGIONELLA PNEUMOPHILA SEROGP 1 UR AG: L. pneumophila Serogp 1 Ur Ag: NEGATIVE

## 2018-09-21 LAB — MAGNESIUM: Magnesium: 1.7 mg/dL (ref 1.7–2.4)

## 2018-09-21 MED ORDER — ZOLPIDEM TARTRATE 5 MG PO TABS
5.0000 mg | ORAL_TABLET | Freq: Every evening | ORAL | Status: DC | PRN
  Administered 2018-09-21 – 2018-09-24 (×4): 5 mg via ORAL
  Filled 2018-09-21 (×4): qty 1

## 2018-09-21 NOTE — Progress Notes (Signed)
Patient ID: Ryan Coffey, male   DOB: 30-Nov-1932, 85 y.o.   MRN: [id number]  PROGRESS NOTE    Ryan Coffey  ZMO:[id number] DOB: [birth year] DOA: 09/18/2018 PCP: Prince Solian, MD   Brief Narrative:  85 year old male with history of renal cell carcinoma in remission, COPD, pediatric tumor, hyperlipidemia, rheumatoid arthritis on methotrexate, pneumonia in March 2020 who presented to the ED 2 days ago for treatment of weakness and was found to have right lower lobe infiltrate but patient was discharged home on oral Levaquin as per patient's request.  Patient presented again on 09/18/2018 with worsening weakness, cough, very poor appetite and difficulty swallowing.  He had a recent high-resolution CT chest done on 09/12/2018 as an outpatient as per Dr. Matilde Bash request which showed extensive somewhat masslike consolidation of the dependent right lung, centered about the superior segment of the right lower lobe although involving the right middle lobe as well.  Findings likely reflected infection or aspiration, although underlying mass is not excluded.  He was found to be febrile in the ED along with hypoxia and leukocytosis.  Patient was started on broad-spectrum antibiotics.  Assessment & Plan:   Acute hypoxic respiratory failure Probable healthcare associated pneumonia with concern for gram-negative pneumonia versus MRSA pneumonia presenting with multilobar right-sided pneumonia -Currently still on 4 L nasal cannula.  Wean off as able.  Does not use oxygen at home.  Does not want to go home on oxygen. -Currently on cefepime and Zithromax.  Vancomycin discontinued on 09/20/2018.  Strep pneumo urinary antigen negative as well.  Awaiting urine Legionella antigen.  Cultures negative so far.  Rapid COVID testing negative. -Incentive spirometry. -Diet as per SLP recommendations. -If symptoms persist after completion of treatment with antibiotics, patient will need pulmonary evaluation and probable  bronchoscopy to rule out malignancy.  Patient was recently evaluated by pulmonary/Dr. Vaughan Browner as an outpatient who had ordered the high-resolution CT.  Patient will need follow-up with pulmonary as an outpatient.  I have notified Dr. Vaughan Browner of the same.  Leukocytosis -Probably from above.  Slightly improving.  Monitor.  Hyponatremia -Probably from decreased p.o. intake.  Sodium improving.  Off IV fluids.  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  Outpatient follow-up  Hypothyroidism -Continue Synthroid  Stage I pressure injury on the coccyx: Present on admission -Follow local wound care  Hypoalbuminemia -Due to poor recent oral intake.  Nutrition consult  Generalized deconditioning--PT recommending home health PT.   DVT prophylaxis: Lovenox Code Status: Full Family Communication: Spoke to daughter/Sharon on phone on 09/20/2018 and on 09/21/2018 Disposition Plan: Home with home health in 1 to 3 days if clinically improves.  Consultants: None  Procedures: None  Antimicrobials:  Cefepime and Zithromax from 09/18/2018 onwards Vancomycin from 09/18/2018-09/20/2018  Subjective: Patient seen and examined at bedside.  Still feels short of breath with exertion with some cough.  No overnight fever or vomiting. Objective: Vitals:   09/21/18 0514 09/21/18 0519 09/21/18 0522 09/21/18 0524  BP:  (!) 152/74    Pulse:  (!) 108    Resp:      Temp:  99.2 F (37.3 C)    TempSrc:  Oral    SpO2: 94%  (!) 84%   Weight:    73 kg  Height:        Intake/Output Summary (Last 24 hours) at 09/21/2018 0758 Last data  filed at 09/21/2018 0537 Gross per 24 hour  Intake 1496 ml  Output 900 ml  Net 596 ml   Filed Weights   09/19/18 0425 09/20/18 0530 09/21/18 0524  Weight: 70.5 kg 72.3 kg  73 kg    Examination:  General exam: No distress.  Looks very thinly built.   Respiratory system: Bilateral decreased breath sounds at bases with scattered crackles, mostly on the right side.  No current wheezing Cardiovascular system: S1-S2 heard, intermittent tachycardia Gastrointestinal system: Abdomen is nondistended, soft and nontender. Normal bowel sounds heard. Extremities: No edema or cyanosis   Data Reviewed: I have personally reviewed following labs and imaging studies  CBC: Recent Labs  Lab 09/16/18 2203 09/18/18 1228 09/18/18 1635 09/19/18 0503 09/20/18 0320  WBC 19.3* 20.5* 21.9* 21.9* 20.5*  NEUTROABS 17.4* 18.8*  --   --  19.0*  HGB 12.8* 11.4* 11.3* 11.6* 10.7*  HCT 39.7 35.0* 34.8* 37.3* 33.1*  MCV 94.7 92.8 93.8 95.9 94.6  PLT 568* 420* 377 413* 485*   Basic Metabolic Panel: Recent Labs  Lab 09/16/18 2203 09/18/18 1228 09/18/18 1635 09/19/18 0503 09/20/18 0320  NA 131* 133*  --  135 136  K 3.9 3.9  --  4.3 4.3  CL 93* 96*  --  101 101  CO2 25 24  --  27 28  GLUCOSE 142* 94  --  127* 126*  BUN 25* 18  --  16 21  CREATININE 1.26* 0.91 0.93 0.78 0.66  CALCIUM 9.0 8.4*  --  8.7* 8.9  MG  --   --   --   --  1.6*   GFR: Estimated Creatinine Clearance: 67.7 mL/min (by C-G formula based on SCr of 0.66 mg/dL). Liver Function Tests: Recent Labs  Lab 09/16/18 2203 09/18/18 1228 09/20/18 0320  AST 30 43* 29  ALT 17 17 19   ALKPHOS 104 83 72  BILITOT 0.9 1.1 0.5  PROT 6.4* 5.3* 4.7*  ALBUMIN 2.0* 1.7* 1.5*   No results for input(s): LIPASE, AMYLASE in the last 168 hours. No results for input(s): AMMONIA in the last 168 hours. Coagulation Profile: Recent Labs  Lab 09/18/18 1228  INR 1.2   Cardiac Enzymes: No results for input(s): CKTOTAL, CKMB, CKMBINDEX, TROPONINI in the last 168 hours. BNP (last 3 results) No results for input(s): PROBNP in the last 8760 hours. HbA1C: No results for input(s): HGBA1C in the last 72 hours. CBG: No  results for input(s): GLUCAP in the last 168 hours. Lipid Profile: No results for input(s): CHOL, HDL, LDLCALC, TRIG, CHOLHDL, LDLDIRECT in the last 72 hours. Thyroid Function Tests: No results for input(s): TSH, T4TOTAL, FREET4, T3FREE, THYROIDAB in the last 72 hours. Anemia Panel: No results for input(s): VITAMINB12, FOLATE, FERRITIN, TIBC, IRON, RETICCTPCT in the last 72 hours. Sepsis Labs: Recent Labs  Lab 09/18/18 1228 09/18/18 1635  LATICACIDVEN 1.5 1.1    Recent Results (from the past 240 hour(s))  SARS Coronavirus 2 (CEPHEID - Performed in [facility] hospital lab), Hosp Order     Status: None   Collection Time: 09/16/18 10:03 PM   Specimen: Nasopharyngeal Swab  Result Value Ref Range Status   SARS Coronavirus 2 NEGATIVE NEGATIVE Final    Comment: (NOTE) If result is NEGATIVE SARS-CoV-2 target nucleic acids are NOT DETECTED. The SARS-CoV-2 RNA is generally detectable in upper and lower  respiratory specimens during the acute phase of infection. The lowest  concentration of SARS-CoV-2 viral copies this assay can detect is 250  copies /  mL. A negative result does not preclude SARS-CoV-2 infection  and should not be used as the sole basis for treatment or other  patient management decisions.  A negative result may occur with  improper specimen collection / handling, submission of specimen other  than nasopharyngeal swab, presence of viral mutation(s) within the  areas targeted by this assay, and inadequate number of viral copies  (<250 copies / mL). A negative result must be combined with clinical  observations, patient history, and epidemiological information. If result is POSITIVE SARS-CoV-2 target nucleic acids are DETECTED. The SARS-CoV-2 RNA is generally detectable in upper and lower  respiratory specimens dur ing the acute phase of infection.  Positive  results are indicative of active infection with SARS-CoV-2.  Clinical  correlation with patient history and other  diagnostic information is  necessary to determine patient infection status.  Positive results do  not rule out bacterial infection or co-infection with other viruses. If result is PRESUMPTIVE POSTIVE SARS-CoV-2 nucleic acids MAY BE PRESENT.   A presumptive positive result was obtained on the submitted specimen  and confirmed on repeat testing.  While 2019 novel coronavirus  (SARS-CoV-2) nucleic acids may be present in the submitted sample  additional confirmatory testing may be necessary for epidemiological  and / or clinical management purposes  to differentiate between  SARS-CoV-2 and other Sarbecovirus currently known to infect humans.  If clinically indicated additional testing with an alternate test  methodology [phone]) is advised. The SARS-CoV-2 RNA is generally  detectable in upper and lower respiratory sp ecimens during the acute  phase of infection. The expected result is Negative. Fact Sheet for Patients:  StrictlyIdeas.no Fact Sheet for Healthcare Providers: BankingDealers.co.za This test is not yet approved or cleared by the [place] FDA and has been authorized for detection and/or diagnosis of SARS-CoV-2 by FDA under an Emergency Use Authorization (EUA).  This EUA will remain in effect (meaning this test can be used) for the duration of the COVID-19 declaration under Section 564(b)(1) of the Act, 21 U.S.C. section 360bbb-3(b)(1), unless the authorization is terminated or revoked sooner. Performed at [facility], [place] [address], [place], [place] [address]   Blood Culture (routine x 2)     Status: None (Preliminary result)   Collection Time: 09/18/18 12:35 PM   Specimen: BLOOD RIGHT FOREARM  Result Value Ref Range Status   Specimen Description BLOOD RIGHT FOREARM  Final   Special Requests   Final    BOTTLES DRAWN AEROBIC AND ANAEROBIC Blood Culture adequate volume   Culture   Final    NO GROWTH 2 DAYS  Performed at [facility], [place] [address] [place], [place] [address]    Report Status PENDING  Incomplete  Blood Culture (routine x 2)     Status: None (Preliminary result)   Collection Time: 09/18/18 12:49 PM   Specimen: BLOOD LEFT FOREARM  Result Value Ref Range Status   Specimen Description BLOOD LEFT FOREARM  Final   Special Requests   Final    BOTTLES DRAWN AEROBIC AND ANAEROBIC Blood Culture adequate volume   Culture   Final    NO GROWTH 2 DAYS Performed at [facility], [place] [address], [place], [place] [address]    Report Status PENDING  Incomplete  SARS Coronavirus 2 (CEPHEID - Performed in [place] hospital lab), Hosp Order     Status: None   Collection Time: 09/18/18  2:47 PM   Specimen: Nasopharyngeal Swab  Result Value  Ref Range Status   SARS Coronavirus 2 NEGATIVE NEGATIVE Final    Comment: (NOTE) If result is NEGATIVE SARS-CoV-2 target nucleic acids are NOT DETECTED. The SARS-CoV-2 RNA is generally detectable in upper and lower  respiratory specimens during the acute phase of infection. The lowest  concentration of SARS-CoV-2 viral copies this assay can detect is 250  copies / mL. A negative result does not preclude SARS-CoV-2 infection  and should not be used as the sole basis for treatment or other  patient management decisions.  A negative result may occur with  improper specimen collection / handling, submission of specimen other  than nasopharyngeal swab, presence of viral mutation(s) within the  areas targeted by this assay, and inadequate number of viral copies  (<250 copies / mL). A negative result must be combined with clinical  observations, patient history, and epidemiological information. If result is POSITIVE SARS-CoV-2 target nucleic acids are DETECTED. The SARS-CoV-2 RNA is generally detectable in upper and lower  respiratory specimens dur ing the acute phase of infection.  Positive  results are indicative of active infection with  SARS-CoV-2.  Clinical  correlation with patient history and other diagnostic information is  necessary to determine patient infection status.  Positive results do  not rule out bacterial infection or co-infection with other viruses. If result is PRESUMPTIVE POSTIVE SARS-CoV-2 nucleic acids MAY BE PRESENT.   A presumptive positive result was obtained on the submitted specimen  and confirmed on repeat testing.  While 2019 novel coronavirus  (SARS-CoV-2) nucleic acids may be present in the submitted sample  additional confirmatory testing may be necessary for epidemiological  and / or clinical management purposes  to differentiate between  SARS-CoV-2 and other Sarbecovirus currently known to infect humans.  If clinically indicated additional testing with an alternate test  methodology [phone]) is advised. The SARS-CoV-2 RNA is generally  detectable in upper and lower respiratory sp ecimens during the acute  phase of infection. The expected result is Negative. Fact Sheet for Patients:  StrictlyIdeas.no Fact Sheet for Healthcare Providers: BankingDealers.co.za This test is not yet approved or cleared by the [place] FDA and has been authorized for detection and/or diagnosis of SARS-CoV-2 by FDA under an Emergency Use Authorization (EUA).  This EUA will remain in effect (meaning this test can be used) for the duration of the COVID-19 declaration under Section 564(b)(1) of the Act, 21 U.S.C. section 360bbb-3(b)(1), unless the authorization is terminated or revoked sooner. Performed at [facility], [place] [address], [place], [place] [address]   MRSA PCR Screening     Status: None   Collection Time: 09/19/18  1:00 PM   Specimen: Nasal Mucosa; Nasopharyngeal  Result Value Ref Range Status   MRSA by PCR NEGATIVE NEGATIVE Final    Comment:        The GeneXpert MRSA Assay (FDA approved for NASAL specimens only), is one component of a  comprehensive MRSA colonization surveillance program. It is not intended to diagnose MRSA infection nor to guide or monitor treatment for MRSA infections. Performed at [facility], [place] [address] [place], [place] [address]          Radiology Studies: No results found.      Scheduled Meds: . bromocriptine  2.5 mg Oral BID  . calcium-vitamin D  1 tablet Oral BID  . enoxaparin (LOVENOX) injection  40 mg Subcutaneous Q24H  . feeding supplement (ENSURE ENLIVE)  237 mL Oral BID BM  . folic acid  2 mg Oral Daily  . hydrocortisone  20 mg Oral Q breakfast   And  . hydrocortisone  10 mg Oral QAC supper  . ipratropium-albuterol  3 mL Nebulization Q6H  . levothyroxine  137 mcg Oral Q0600  . methotrexate  10 mg Oral Once per day on Sun Sat  . nystatin  5 mL Oral QID  . pantoprazole  40 mg Oral BID  . pravastatin  80 mg Oral Daily   Continuous Infusions: . azithromycin 500 mg (09/20/18 1657)  . ceFEPime (MAXIPIME) IV 2 g (09/20/18 2355)     LOS: 3 days        Aline August, MD [facility] 09/21/2018, 7:58 AM

## 2018-09-21 NOTE — Progress Notes (Signed)
Pharmacy Antibiotic Note  Ryan Coffey is a 85 y.o. male admitted on 09/18/2018 with pneumonia. Pharmacy has been consulted for cefepime dosing. Already received one time dose of cefepime 2g x1 and vanc 1250mg  x1 in ED.   Plan: Cefepime 2g IV q12h Azithromycin 500mg  IV q24h Monitor renal function and clinical progression  Height: 5\' 8"  (172.7 cm) Weight: 161 lb (73 kg) IBW/kg (Calculated) : 68.4  Temp (24hrs), Avg:98.6 F (37 C), Min:98 F (36.7 C), Max:99.2 F (37.3 C)  Recent Labs  Lab 09/18/18 1228 09/18/18 1635 09/19/18 0503 09/20/18 0320 09/21/18 0821  WBC 20.5* 21.9* 21.9* 20.5* 19.7*  CREATININE 0.91 0.93 0.78 0.66 0.73  LATICACIDVEN 1.5 1.1  --   --   --     Estimated Creatinine Clearance: 67.7 mL/min (by C-G formula based on SCr of 0.73 mg/dL).    No Known Allergies  Antimicrobials this admission: Vancomycin 7/16 >> 7/17 Cefepime 7/16 >> Azithromycin 7/16 >>  Microbiology results: 7/17 Urine culture: negative 7/17 MRSA PCR negative 7/16 COVID negative 7/16 BCx x 2: negative  7/14 COVID negative   Lorel Monaco, PharmD PGY1 Ambulatory Care Resident [employer] # [phone]

## 2018-09-22 ENCOUNTER — Inpatient Hospital Stay (HOSPITAL_COMMUNITY): Payer: Medicare Other

## 2018-09-22 ENCOUNTER — Telehealth: Payer: Self-pay | Admitting: Pulmonary Disease

## 2018-09-22 ENCOUNTER — Ambulatory Visit: Payer: Medicare Other | Admitting: Pulmonary Disease

## 2018-09-22 DIAGNOSIS — J189 Pneumonia, unspecified organism: Secondary | ICD-10-CM

## 2018-09-22 DIAGNOSIS — L8990 Pressure ulcer of unspecified site, unspecified stage: Secondary | ICD-10-CM | POA: Diagnosis present

## 2018-09-22 DIAGNOSIS — I361 Nonrheumatic tricuspid (valve) insufficiency: Secondary | ICD-10-CM

## 2018-09-22 DIAGNOSIS — Y95 Nosocomial condition: Secondary | ICD-10-CM

## 2018-09-22 LAB — CBC WITH DIFFERENTIAL/PLATELET
Abs Immature Granulocytes: 0.17 10*3/uL — ABNORMAL HIGH (ref 0.00–0.07)
Basophils Absolute: 0 10*3/uL (ref 0.0–0.1)
Basophils Relative: 0 %
Eosinophils Absolute: 0.1 10*3/uL (ref 0.0–0.5)
Eosinophils Relative: 0 %
HCT: 33.3 % — ABNORMAL LOW (ref 39.0–52.0)
Hemoglobin: 10.8 g/dL — ABNORMAL LOW (ref 13.0–17.0)
Immature Granulocytes: 1 %
Lymphocytes Relative: 6 %
Lymphs Abs: 1.1 10*3/uL (ref 0.7–4.0)
MCH: 30.1 pg (ref 26.0–34.0)
MCHC: 32.4 g/dL (ref 30.0–36.0)
MCV: 92.8 fL (ref 80.0–100.0)
Monocytes Absolute: 0.3 10*3/uL (ref 0.1–1.0)
Monocytes Relative: 2 %
Neutro Abs: 15.7 10*3/uL — ABNORMAL HIGH (ref 1.7–7.7)
Neutrophils Relative %: 91 %
Platelets: 514 10*3/uL — ABNORMAL HIGH (ref 150–400)
RBC: 3.59 MIL/uL — ABNORMAL LOW (ref 4.22–5.81)
RDW: 15.7 % — ABNORMAL HIGH (ref 11.5–15.5)
WBC: 17.3 10*3/uL — ABNORMAL HIGH (ref 4.0–10.5)
nRBC: 0 % (ref 0.0–0.2)

## 2018-09-22 LAB — BASIC METABOLIC PANEL
Anion gap: 9 (ref 5–15)
BUN: 11 mg/dL (ref 8–23)
CO2: 29 mmol/L (ref 22–32)
Calcium: 8.5 mg/dL — ABNORMAL LOW (ref 8.9–10.3)
Chloride: 97 mmol/L — ABNORMAL LOW (ref 98–111)
Creatinine, Ser: 0.58 mg/dL — ABNORMAL LOW (ref 0.61–1.24)
GFR calc Af Amer: >60 mL/min (ref >60)
GFR calc non Af Amer: >60 mL/min (ref >60)
Glucose, Bld: 85 mg/dL (ref 70–99)
Potassium: 3.5 mmol/L (ref 3.5–5.1)
Sodium: 135 mmol/L (ref 135–145)

## 2018-09-22 LAB — ECHOCARDIOGRAM COMPLETE
Height: 68 in
Weight: 2576 oz

## 2018-09-22 LAB — MAGNESIUM: Magnesium: 1.5 mg/dL — ABNORMAL LOW (ref 1.7–2.4)

## 2018-09-22 MED ORDER — LEVALBUTEROL HCL 0.63 MG/3ML IN NEBU
0.6300 mg | INHALATION_SOLUTION | Freq: Three times a day (TID) | RESPIRATORY_TRACT | Status: DC | PRN
  Administered 2018-09-22: 06:00:00 0.63 mg via RESPIRATORY_TRACT
  Filled 2018-09-22: qty 3

## 2018-09-22 MED ORDER — MAGNESIUM SULFATE 2 GM/50ML IV SOLN
2.0000 g | Freq: Once | INTRAVENOUS | Status: AC
  Administered 2018-09-22: 11:00:00 2 g via INTRAVENOUS
  Filled 2018-09-22: qty 50

## 2018-09-22 MED ORDER — VANCOMYCIN HCL 10 G IV SOLR: 1500.0000 mg | INTRAVENOUS | Status: DC

## 2018-09-22 MED ORDER — CLINDAMYCIN HCL 300 MG PO CAPS
300.0000 mg | ORAL_CAPSULE | Freq: Three times a day (TID) | ORAL | Status: DC
  Administered 2018-09-22 – 2018-09-25 (×10): 300 mg via ORAL
  Filled 2018-09-22 (×12): qty 1

## 2018-09-22 MED ORDER — VANCOMYCIN HCL 10 G IV SOLR
1500.0000 mg | Freq: Once | INTRAVENOUS | Status: DC
  Administered 2018-09-22: 1500 mg via INTRAVENOUS
  Filled 2018-09-22: qty 1500

## 2018-09-22 MED ORDER — BUDESONIDE 0.5 MG/2ML IN SUSP
0.5000 mg | Freq: Two times a day (BID) | RESPIRATORY_TRACT | Status: DC
  Administered 2018-09-22 – 2018-09-25 (×5): 0.5 mg via RESPIRATORY_TRACT
  Filled 2018-09-22 (×6): qty 2

## 2018-09-22 MED ORDER — FUROSEMIDE 10 MG/ML IJ SOLN
60.0000 mg | Freq: Once | INTRAMUSCULAR | Status: AC
  Administered 2018-09-22: 60 mg via INTRAVENOUS
  Filled 2018-09-22 (×2): qty 6

## 2018-09-22 NOTE — Progress Notes (Signed)
Patient's oral temperature this morning was 103.1 Farenheit.  Tylenol 650mg  PO per PRN order given at 0534.  Patient was on 4L nasal cannula with oxygen saturation 88-90%.  Patient placed on high flow nasal cannula at 6L with oxygen saturation 93%.  Ice packs placed in patient's armpits.  Right middle lobe and right lower lobes coarse crackles noted. RN text paged [facility].  Order entered for PRN Xopenex treatment per on call.  Xopenex administered per PRN order.

## 2018-09-22 NOTE — Progress Notes (Signed)
  Echocardiogram 2D Echocardiogram has been performed.  Ryan Coffey 09/22/2018, 11:13 AM

## 2018-09-22 NOTE — Progress Notes (Addendum)
Pharmacy Antibiotic Note  Ryan Coffey is a 85 y.o. male admitted on 09/18/2018 with pneumonia.  Pharmacy has been consulted for vancomycin dosing.  Currently on cefepime + azithro.  New Tmax 103.1, inc SOB and O2 demand.  Received vanc 1250mg  IV last on 7/17.    Plan: Vancomycin 1500mg  IV x1, then 1500mg  IV q 24h (calc AUC 471) Monitor renal function, clinical progression to narrow given negative MRSA PCR Vancomycin levels at steady state  Height: 5\' 8"  (172.7 cm) Weight: 161 lb (73 kg) IBW/kg (Calculated) : 68.4  Temp (24hrs), Avg:100.7 F (38.2 C), Min:98.9 F (37.2 C), Max:103.1 F (39.5 C)  Recent Labs  Lab 09/18/18 1228 09/18/18 1635 09/19/18 0503 09/20/18 0320 09/21/18 0821 09/22/18 0352  WBC 20.5* 21.9* 21.9* 20.5* 19.7* 17.3*  CREATININE 0.91 0.93 0.78 0.66 0.73 0.58*  LATICACIDVEN 1.5 1.1  --   --   --   --     Estimated Creatinine Clearance: 67.7 mL/min (A) (by C-G formula based on SCr of 0.58 mg/dL (L)).    No Known Allergies  Antimicrobials this admission: Vanc 7/16 >> 7/17; 7/20>> Cefepime 7/16 >> Azithro 7/16 >>   Dose adjustments this admission: n/a  Microbiology results: 7/16 BCx: ngtd 7/17 UCx: no growth MRSA PCR negative  Bertis Ruddy, PharmD Clinical Pharmacist Please check AMION for all [place] numbers 09/22/2018 8:29 AM

## 2018-09-22 NOTE — Progress Notes (Signed)
Physical Therapy Treatment Patient Details Name: Ryan Coffey MRN: [id number] DOB: January 05, 1933 Today's Date: 09/22/2018    History of Present Illness 85 year old male with history of renal cell carcinoma in remission, COPD, pediatric tumor, hyperlipidemia, rheumatoid arthritis on methotrexate, pneumonia in March 2020 who presented to the ED 09/16/18  for treatment of weakness and was found to have right lower lobe infiltrate but patient was discharged home on oral Levaquin as per patient's request. Admitted 09/18/18 for hypoxia and leukocytosis secondary to PNA.     PT Comments    Pt is frustrated with his health and pulmonary limitations to his activity. Pt agreeable to work with therapy but request not to ambulate due to foot pain from lack of cushioning with walking in sock feet. Pt able to transfer to recliner with min guard. Pt performed 5x sit>stand with focus on maintaining O2 saturation >90% and then performed LE exercises in seated. D/c plans remain appropriate. PT will continue to follow acutely.    Follow Up Recommendations  Home health PT;Supervision - Intermittent     Equipment Recommendations  Rolling walker with 5" wheels       Precautions / Restrictions Precautions Precautions: Fall Restrictions Weight Bearing Restrictions: No    Mobility  Bed Mobility Overal bed mobility: Needs Assistance Bed Mobility: Supine to Sit;Sit to Supine     Supine to sit: Supervision     General bed mobility comments: supervision for safety, use of bed rail to pull to EoB  Transfers Overall transfer level: Needs assistance Equipment used: Rolling walker (2 wheeled) Transfers: Sit to/from [facility] Sit to Stand: Min guard Stand pivot transfers: Min guard       General transfer comment: min guard for power up to RW from bed surface,vc for hand placement, pt with increased posterior lean due to both hands on RW, once in recliner pt able to perform 5x sit>stand, vc  for pacing to keep SaO2 >90%  Ambulation/Gait             General Gait Details: deferred as pt has toe deformity due to RA that make ambulation in sock feet painful, requested if family member brings things to the hospital they bring shoes       Balance Overall balance assessment: Mild deficits observed, not formally tested                                          Cognition Arousal/Alertness: Awake/alert Behavior During Therapy: WFL for tasks assessed/performed Overall Cognitive Status: Within Functional Limits for tasks assessed                                        Exercises General Exercises - Lower Extremity Quad Sets: AROM;Both;20 reps;Seated Gluteal Sets: AROM;Both;20 reps;Seated Long Arc Quad: AROM;Both;10 reps;Seated Hip ABduction/ADduction: AROM;Both;20 reps;Seated Hip Flexion/Marching: AROM;Both;20 reps;Seated Toe Raises: AROM;Both;20 reps;Seated Heel Raises: AROM;Both;20 reps;Seated    General Comments General comments (skin integrity, edema, etc.): Pt of 5 L O2 via HFNC and able to maintain >90%O2 except for one bout in mid 80's with exercise, worked with pt on pacing      Pertinent Vitals/Pain Pain Assessment: No/denies pain           PT Goals (current goals can now be found in the care plan section)  Acute Rehab PT Goals Patient Stated Goal: go home PT Goal Formulation: With patient Time For Goal Achievement: 10/04/18 Potential to Achieve Goals: Fair    Frequency    Min 3X/week      PT Plan Current plan remains appropriate       AM-PAC PT "6 Clicks" Mobility   Outcome Measure  Help needed turning from your back to your side while in a flat bed without using bedrails?: None Help needed moving from lying on your back to sitting on the side of a flat bed without using bedrails?: A Little Help needed moving to and from a bed to a chair (including a wheelchair)?: A Little Help needed standing up from a  chair using your arms (e.g., wheelchair or bedside chair)?: A Little Help needed to walk in hospital room?: A Little Help needed climbing 3-5 steps with a railing? : A Lot 6 Click Score: 18    End of Session Equipment Utilized During Treatment: Gait belt;Oxygen Activity Tolerance: Treatment limited secondary to medical complications (Comment)(oxygen desaturation ) Patient left: in bed;with call bell/phone within reach;with bed alarm set Nurse Communication: Mobility status PT Visit Diagnosis: Other abnormalities of gait and mobility (R26.89);Muscle weakness (generalized) (M62.81);Difficulty in walking, not elsewhere classified (R26.2)     Time: 9244-6286 PT Time Calculation (min) (ACUTE ONLY): 23 min  Charges:  $Therapeutic Exercise: 8-22 mins $Therapeutic Activity: 8-22 mins                     Malka Bocek B. Migdalia Dk PT, DPT Acute Rehabilitation Services Pager [phone] Office [phone]    [place] 09/22/2018, 3:32 PM

## 2018-09-22 NOTE — Progress Notes (Signed)
Called daughter  Left message on the voicemail

## 2018-09-22 NOTE — Care Management Important Message (Signed)
Important Message  Patient Details  Name: WILLIE PLAIN MRN: [id number] Date of Birth: 1932/03/26   Medicare Important Message Given:  Yes     Shelda Altes 09/22/2018, 2:25 PM

## 2018-09-22 NOTE — Progress Notes (Signed)
PCCM Communication Note  Paged by RN at request of Primary MD, Dr. Remi Haggard, to call family regarding pulmonary updates.   I have attempted to reach [place], Vevelyn Francois, twice at number listed in chart and have been directed to voicemail.    Eliseo Gum MSN, AGACNP-BC [place] [phone] If no answer, [phone] 09/22/2018, 3:10 PM

## 2018-09-22 NOTE — TOC Initial Note (Signed)
Transition of Care [facility]) - Initial/Assessment Note    Patient Details  Name: Ryan Coffey MRN: [id number] Date of Birth: 07/03/1932  Transition of Care [facility]) CM/SW Contact:    Eileen Stanford, LCSW Phone Number: 09/22/2018, 3:58 PM  Clinical Narrative:   Pt is alert and oriented. Pt lives home alone. Pt states he has had home health in the past. Pt could not recall agency but is agreeable to use them again. Pt suggest CSW call his daughters to determine agency.     CSW spoke with pt's daughter and neither of them could remember the agency name however would try to find the name and get back with CSW.             Expected Discharge Plan: [place] Barriers to Discharge: Continued Medical Work up   Patient Goals and CMS Choice Patient states their goals for this hospitalization and ongoing recovery are:: to get better   Choice offered to / list presented to : Adult Children  Expected Discharge Plan and Services Expected Discharge Plan: [place] In-house Referral: NA   Post Acute Care Choice: [place] arrangements for the past 2 months: Single Family Home                                      Prior Living Arrangements/Services Living arrangements for the past 2 months: Single Family Home Lives with:: Self Patient language and need for interpreter reviewed:: Yes        Need for Family Participation in Patient Care: Yes (Comment) Care giver support system in place?: Yes (comment)(daughters) Current home services: Home PT Criminal Activity/Legal Involvement Pertinent to Current Situation/Hospitalization: No - Comment as needed  Activities of Daily Living      Permission Sought/Granted Permission sought to share information with : Facility Sport and exercise psychologist, Family Supports Permission granted to share information with : Yes, Verbal Permission Granted  Share Information with NAME: Ivin Booty and [place] granted to share info w Relationship: Daughters  Permission granted to share info w Contact Information: [phone]  Emotional Assessment Appearance:: Appears stated age Attitude/Demeanor/Rapport: Engaged Affect (typically observed): Accepting, Appropriate, Calm Orientation: : Oriented to Self, Oriented to Place, Oriented to  Time, Oriented to Situation Alcohol / Substance Use: Not Applicable Psych Involvement: No (comment)  Admission diagnosis:  HCAP (healthcare-associated pneumonia) [J18.9] Sepsis with acute hypoxic respiratory failure without septic shock, due to unspecified organism ([place]) [A41.9, R65.20, J96.01] Patient Active Problem List   Diagnosis Date Noted  . Pressure injury of skin 09/22/2018  . HAP (hospital-acquired pneumonia) 09/18/2018  . Immunocompromised state ([place]) 09/18/2018  . Other emphysema ([place]) 08/29/2018  . Unintentional weight loss 06/18/2018  . Chronic cough 06/18/2018  . COPD (chronic obstructive pulmonary disease) ([place]) 08/29/2016  . Rheumatoid nodulosis ([place]) 08/24/2016  . High risk medication use 08/24/2016  . Thrombocytosis ([place]) 08/24/2016  . Elevated LFTs mild elevation of ALT  08/24/2016  . History of renal cell carcinoma 08/24/2016  . History of prostate cancer 08/24/2016  . History of adrenal insufficiency 08/24/2016  . HCAP (healthcare-associated pneumonia) 06/27/2011    Class: Acute  . Seropositive rheumatoid arthritis ([place]) 06/27/2011  . Panhypopituitarism ([place]) 06/27/2011  . Degenerative joint disease 06/27/2011  . History of tobacco use 06/27/2011  . Hyperlipidemia 06/27/2011   PCP:  Prince Solian, MD Pharmacy:   [facility]  [place], [place] [address] [place] [address] Phone: [phone] Fax: [phone]     Social Determinants of Health (SDOH) Interventions    Readmission Risk Interventions No flowsheet data  found.

## 2018-09-22 NOTE — Telephone Encounter (Signed)
Per Dr Vaughan Browner-  Marshell Garfinkel, MD  P [facility] Triage Pool        Patient is currently in hospital receiving antibiotics for pneumonia. COVID test is negative   Please ensure to follow-up with CT in 6 to 8 weeks and clinic visit after CT    Patient is currently admitted at [facility].  Will leave message open for follow up.

## 2018-09-22 NOTE — Progress Notes (Signed)
SLP Cancellation Note  Patient Details Name: BHAVESH VAZQUEZ MRN: [id number] DOB: 09-Sep-1932   Cancelled treatment:       Reason Eval/Treat Not Completed: Other (comment) Attempted to see pt but he is currently toileting, receiving nursing care. Will f/u as able.   Venita Sheffield Moishy Laday 09/22/2018, 10:05 AM  Pollyann Glen, M.A. [place] Acute Environmental education officer [phone] Office [phone]

## 2018-09-22 NOTE — Progress Notes (Signed)
Oral temperature rechecked and is 100 Farenheit.

## 2018-09-22 NOTE — Progress Notes (Signed)
Re: Pt daughter Colletta Maryland called and is requesting to be updated with pt latest condition. Dr. Earlie Server called and updated Ms Colletta [place], They are also requesting for Pulmonary service to give her a call . Tel #   [phone] ). Pulmonary service paged to call back ; [phone] and [phone].

## 2018-09-22 NOTE — Consult Note (Signed)
NAME:  Ryan Coffey, MRN:  [id number], DOB:  [birth year], LOS: 4 ADMISSION DATE:  09/18/2018, CONSULTATION DATE:  09/22/2018 REFERRING MD:  Remi Haggard, CHIEF COMPLAINT:  Weakness, SOB   Brief History   85 yo seen by Dr. Vaughan Browner, exhibiting worsening hypoxia inpatient and CXR with concern for cavitary lung lesion RML   History of present illness   85 yo M PMH renal cell carcinoma (believed to be in remission), possible COPD (symbicort, no home O2), HLD, RA (on MTX), pituitary tumor, PNA (hospitalized 05/2018)  who presents to hospital 7/16 with complains of SOB and weakness. Patient noted associated white coating on tongue and sore throat  causing poor PO intake, green colored sputum x 3 months. Patient endorsed cough x 5-6 months. Patient denied chest pain, palpitations, PND, orthopnea, nausea, vomiting, diarrhea. He stated in ED that he might be dizzy. He denied falls.   Of note, patient presented to ED 2 days prior for CC weakness and was found to have PNA and renal insufficiency. Admission was recommended at this time however patient elected to dc home after IVF, and was prescribed levaquin for PNA however patient did not fill this prescription.   Patient was seen by Dr. Vaughan Browner, [facility], in June/2020 and a high resolution CT chest revealed extensive, somewhat mass-like consolidation of R lung, centered about superior segment of RLL and involving RML.   Past Medical History  ? COPD PNA Renal cell carcinoma  RA  Significant Hospital Events   7/16> admitted 7/20> PCCM consulted   Consults:  PCCM   Procedures:   Significant Diagnostic Tests:  CT chest 7/10 > extensive mass-like consolidation of dependent R lung, centered about superior segment RLL and involving RML. Severe emphysema.   CXR 7/20> RML RLL infiltrates with slight progression from prior. Possible cavitation within RML infiltrate   Micro Data:  7/16 SARS CoV2 > neg  7/17 MRSA> neg  7/17 UCx > no growth 7/17 BCx  No growth   Antimicrobials:  Vanc 7/16>>  Cefepime 7/16 >>   Interim history/subjective:  On 5L HFNC   Objective   Blood pressure (!) 159/73, pulse (!) 114, temperature 100 F (37.8 C), temperature source Oral, resp. rate 16, height 5\' 8"  (1.727 m), weight 73 kg, SpO2 99 %.        Intake/Output Summary (Last 24 hours) at 09/22/2018 1035 Last data filed at 09/22/2018 0300 Gross per 24 hour  Intake 1450.56 ml  Output 750 ml  Net 700.56 ml   Filed Weights   09/19/18 0425 09/20/18 0530 09/21/18 0524  Weight: 70.5 kg 72.3 kg 73 kg    Examination: General: chronically ill appearing older adult M, supine in bed NAD HENT: NCAT. Pink mm, mild glossitis. No post-nasal drip at time of evaluation, patent nares with [place] in place  Lungs: R sided crackles. Symmetrical chest expansion, no accessory muscle recruitment Cardiovascular: RRR s1s2 no rgm. Trace BLE edema  Abdomen: soft flat ndnt. + bowel sounds x4  Extremities: L hand chronic arthritic changes. Symmetrical muscle bulk and tone. No cyanosis no clubbing  Neuro: AAOx4. PERRL. Following commands. Psych: appropriate for age and situation.  GU: clear light yellow urine collecting in bedside urinal   Resolved Hospital Problem list    HCAP: multilobar right sided pneumonia. Location somewhat concerning for aspiration although SLP evaluation does not seem to endorse this. There is some cavitary component of this with air fluid level developing. Cultures negative so far. He is immunocompromised due to methotrexate for  25 yrs for RA. Spiking fevers in the AM hours of 7/20. - Supplemental O2 to keep SpO2 > 92% (cuurently on 5L) - Repeat CT today ,10 days out from prior. repeating CT given CXR progression.  - Continue antibiotics. Would change to clindamycin considering aspiration concern and cavitary changes.  At least 6 weeks of ABX then follow up CT as an outpatient.    COPD concern for COPD based on emphysematous changes on imaging.  PFT pending. Followed by Dr. Vaughan Browner, who has started a trial of Trelegy. Peripheral eosinophils 221. He has been unable to tolerate inhalers due to mouth/throat pain.  - Continue duoneb - Will add budesonide nebs as well.   Suspect ILD r/t RA - Supplemental O2 as need to keep SpO2 > 92%   Best practice:  Diet: Per primary/SLP Pain/Anxiety/Delirium protocol (if indicated): NA VAP protocol (if indicated): NA DVT prophylaxis: enoxaparin  GI prophylaxis: Pantoprazole  Glucose control: NA Mobility: Per primary Code Status: DNR Family Communication: patient updated Disposition: medical floor.   Labs   CBC: Recent Labs  Lab 09/16/18 2203 09/18/18 1228 09/18/18 1635 09/19/18 0503 09/20/18 0320 09/21/18 0821 09/22/18 0352  WBC 19.3* 20.5* 21.9* 21.9* 20.5* 19.7* 17.3*  NEUTROABS 17.4* 18.8*  --   --  19.0* 17.8* 15.7*  HGB 12.8* 11.4* 11.3* 11.6* 10.7* 11.6* 10.8*  HCT 39.7 35.0* 34.8* 37.3* 33.1* 36.8* 33.3*  MCV 94.7 92.8 93.8 95.9 94.6 94.8 92.8  PLT 568* 420* 377 413* 415* 522* 514*    Basic Metabolic Panel: Recent Labs  Lab 09/18/18 1228 09/18/18 1635 09/19/18 0503 09/20/18 0320 09/21/18 0821 09/22/18 0352  NA 133*  --  135 136 136 135  K 3.9  --  4.3 4.3 3.7 3.5  CL 96*  --  101 101 100 97*  CO2 24  --  27 28 27 29   GLUCOSE 94  --  127* 126* 103* 85  BUN 18  --  16 21 16 11   CREATININE 0.91 0.93 0.78 0.66 0.73 0.58*  CALCIUM 8.4*  --  8.7* 8.9 9.0 8.5*  MG  --   --   --  1.6* 1.7 1.5*   GFR: Estimated Creatinine Clearance: 67.7 mL/min (A) (by C-G formula based on SCr of 0.58 mg/dL (L)). Recent Labs  Lab 09/18/18 1228 09/18/18 1635 09/19/18 0503 09/20/18 0320 09/21/18 0821 09/22/18 0352  WBC 20.5* 21.9* 21.9* 20.5* 19.7* 17.3*  LATICACIDVEN 1.5 1.1  --   --   --   --     Liver Function Tests: Recent Labs  Lab 09/16/18 2203 09/18/18 1228 09/20/18 0320  AST 30 43* 29  ALT 17 17 19   ALKPHOS 104 83 72  BILITOT 0.9 1.1 0.5  PROT 6.4* 5.3*  4.7*  ALBUMIN 2.0* 1.7* 1.5*   No results for input(s): LIPASE, AMYLASE in the last 168 hours. No results for input(s): AMMONIA in the last 168 hours.  ABG    Component Value Date/Time   PHART 7.417 08/25/2006 0500   PCO2ART 38.4 08/25/2006 0500   PO2ART 45.7 (L) 08/25/2006 0500   HCO3 24.3 (H) 08/25/2006 0500   TCO2 25.4 08/25/2006 0500   O2SAT 82.8 08/25/2006 0500     Coagulation Profile: Recent Labs  Lab 09/18/18 1228  INR 1.2    Cardiac Enzymes: No results for input(s): CKTOTAL, CKMB, CKMBINDEX, TROPONINI in the last 168 hours.  HbA1C: No results found for: HGBA1C  CBG: No results for input(s): GLUCAP in the last 168 hours.  Review  of Systems:   As per HPI   Past Medical History  He,  has a past medical history of Arthritis, Benign tumor of pituitary gland ([place]), COPD (chronic obstructive pulmonary disease) ([place]), Pneumonia (06/27/11), and Shingles.   Surgical History    Past Surgical History:  Procedure Laterality Date  . CHOLECYSTECTOMY OPEN  2010  . KIDNEY SURGERY  2011   "lesion removed right side"  . RENAL BIOPSY  2012   right  . TRANSPHENOIDAL / TRANSNASAL HYPOPHYSECTOMY / RESECTION PITUITARY TUMOR  ~ 1979   "they couldn't get it all"     Social History   reports that he quit smoking about 38 years ago. His smoking use included cigarettes. He has a 5.00 pack-year smoking history. He has never used smokeless tobacco. He reports current alcohol use of about 3.0 standard drinks of alcohol per week. He reports that he does not use drugs.   Family History   His family history includes Cancer in his brother; Heart disease in his brother and mother.   Allergies No Known Allergies   Home Medications  Prior to Admission medications   Medication Sig Start Date End Date Taking? Authorizing Provider  acetaminophen (TYLENOL) 325 MG tablet Take 650 mg by mouth 2 (two) times daily as needed (arthritis).   Yes [provider]  bromocriptine  (PARLODEL) 2.5 MG tablet Take 2.5 mg by mouth 2 (two) times daily.   Yes [provider]  budesonide-formoterol (SYMBICORT) 80-4.5 MCG/ACT inhaler Inhale 2 puffs into the lungs 2 (two) times a day. 09/11/18  Yes Fenton Foy, NP  Calcium Carbonate-Vitamin D (CALCIUM 500 + D) 500-125 MG-UNIT TABS Take 1 tablet by mouth 2 (two) times daily.   Yes [provider]  folic acid (FOLVITE) 1 MG tablet TAKE 2 TABLETS EVERY DAY Patient taking differently: Take 2 mg by mouth daily.  08/04/18  Yes Deveshwar, Abel Presto, MD  hydrocortisone (CORTEF) 20 MG tablet Take 10-20 mg by mouth daily. 20 mg in the morning and 10mg  at night 11/02/16  Yes [provider]  levofloxacin (LEVAQUIN) 750 MG tablet Take 1 tablet (750 mg total) by mouth daily for 7 days. 09/16/18 09/23/18 Yes Carmin Muskrat, MD  levothyroxine (SYNTHROID, LEVOTHROID) 137 MCG tablet Take 137 mcg by mouth daily.     Yes [provider]  methotrexate (RHEUMATREX) 2.5 MG tablet TAKE 4 TABLETS ON SATURDAY AND 4 TABLETS ON SUNDAY. CAUTION:CHEMOTHERAPY. PROTECT FROM LIGHT. Patient taking differently: Take 10 mg by mouth See admin instructions. TAKE 4 tablets on Saturday and 4 tablets on Sunday. Caution:Chemotherapy. Protect from light. 09/10/18  Yes Deveshwar, Abel Presto, MD  pantoprazole (PROTONIX) 40 MG tablet Take 40 mg by mouth 2 (two) times daily.   Yes [provider]  pravastatin (PRAVACHOL) 80 MG tablet Take 80 mg by mouth daily.   Yes [provider]  testosterone cypionate (DEPOTESTOSTERONE CYPIONATE) 200 MG/ML injection Inject 75 mg into the muscle every 14 (fourteen) days.  05/24/16  Yes [provider]  zolpidem (AMBIEN CR) 6.25 MG CR tablet Take 1 tablet (6.25 mg total) by mouth at bedtime as needed for sleep. 05/20/18  Yes Shelly Coss, MD      Eliseo Gum MSN, AGACNP-BC [place] [phone] If no answer, [phone] 09/22/2018, 11:30 AM

## 2018-09-22 NOTE — Progress Notes (Signed)
Dr. Earlie Server updated with episode of elevated temp at 103.1, Increased SOB, ,increased O2 usage at 8L from 2 L - 4 L prior via nasal cannula, Lung sounds fine to coarse crackles, R> L. Will continue to monitor.

## 2018-09-22 NOTE — Progress Notes (Signed)
Patient ID: Ryan Coffey, male   DOB: June 11, 1932, 85 y.o.   MRN: [id number]  PROGRESS NOTE    Ryan Coffey  YFV:[id number] DOB: [birth year] DOA: 09/18/2018 PCP: Prince Solian, MD   Brief Narrative:  85 year old male with history of renal cell carcinoma in remission, COPD, pediatric tumor, hyperlipidemia, rheumatoid arthritis on methotrexate, pneumonia in March 2020 who presented to the ED 2 days ago for treatment of weakness and was found to have right lower lobe infiltrate but patient was discharged home on oral Levaquin as per patient's request.  Patient presented again on 09/18/2018 with worsening weakness, cough, very poor appetite and difficulty swallowing.  He had a recent high-resolution CT chest done on 09/12/2018 as an outpatient as per Dr. Matilde Bash request which showed extensive somewhat masslike consolidation of the dependent right lung, centered about the superior segment of the right lower lobe although involving the right middle lobe as well.  Findings likely reflected infection or aspiration, although underlying mass is not excluded.  He was found to be febrile in the ED along with hypoxia and leukocytosis.  Patient was started on broad-spectrum antibiotics.  Assessment & Plan:   Acute hypoxic respiratory failure Probable healthcare associated pneumonia with concern for gram-negative pneumonia versus MRSA pneumonia presenting with multilobar right-sided pneumonia -Respiratory status worse this morning; requiring 6 to 8 L high flow nasal cannula.  Also had a temperature spike earlier this morning of 103.1.  Wean off as able.  Does not use oxygen at home.  Repeat cultures.  Repeat chest x-ray.  Restart vancomycin.  Will give 1 dose of IV Lasix as well 60 mg. -Currently on cefepime and Zithromax.  Vancomycin discontinued on 09/20/2018.  Strep pneumo urinary antigen negative as well.  Urine Legionella antigen negative.  Will DC Zithromax.  Cultures negative so far.  Rapid COVID testing  negative. -Incentive spirometry. -Diet as per SLP recommendations. -If symptoms persist after completion of treatment with antibiotics, patient will need pulmonary evaluation and probable bronchoscopy to rule out malignancy.  Patient was recently evaluated by pulmonary/Dr. Vaughan Browner as an outpatient who had ordered the high-resolution CT.  Patient will need follow-up with pulmonary as an outpatient.  I have notified Dr. Vaughan Browner of the same.  Leukocytosis -Probably from above.  Slightly improving.  Monitor.  Hyponatremia -Probably from decreased p.o. intake.  Improved.  Off IV fluids.  Encourage oral intake  Hypomagnesemia -Replace.  Repeat a.m. 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  Outpatient follow-up  Hypothyroidism -Continue Synthroid  Stage I pressure injury on the coccyx: Present on admission -Follow local wound care  Hypoalbuminemia -Due to poor recent oral intake.  Nutrition consult  Generalized deconditioning--PT recommending home health PT.   DVT prophylaxis: Lovenox Code Status: Full Family Communication: Spoke to daughter/Sharon on phone on 09/20/2018 and on 09/21/2018 Disposition Plan: Home with home health in 1 to 3 days if clinically improves.  Consultants: None  Procedures: None  Antimicrobials:  Cefepime and Zithromax from 09/18/2018 onwards Vancomycin from 09/18/2018-09/20/2018  Subjective: Patient seen and examined at bedside.  Feels more short of breath.  Had fever this morning.  Denies any chest pain, nausea or vomiting.  No diarrhea.   Objective: Vitals:   09/21/18 2117 09/22/18 0128 09/22/18 0534 09/22/18 0634  BP: (!) 144/70  (!) 159/73   Pulse: (!) 105 (!) 109 (!) 114   Resp:  16 16  Temp: 98.9 F (37.2 C)  (!) 103.1 F (39.5 C) 100 F (37.8 C)  TempSrc: Oral  Oral Oral  SpO2:  93% 92% 94%   Weight:      Height:        Intake/Output Summary (Last 24 hours) at 09/22/2018 0815 Last data filed at 09/22/2018 0300 Gross per 24 hour  Intake 1690.56 ml  Output 900 ml  Net 790.56 ml   Filed Weights   09/19/18 0425 09/20/18 0530 09/21/18 0524  Weight: 70.5 kg 72.3 kg 73 kg    Examination:  General exam: No acute distress.  Looks very thinly built.   ENT: No JVD.   Lymph: No cervical lymphadenopathy Respiratory system: Bilateral decreased breath sounds at bases with scattered crackles.  No wheezing  cardiovascular system: Intermittently tachycardic.  S1-S2 heard Gastrointestinal system: Abdomen is nondistended, soft and nontender. Normal bowel sounds heard. Extremities: No edema or cyanosis CNS: Alert awake and oriented.  No focal neurologic deficit Psychiatry: Slightly anxious Musculoskeletal: No joint swelling, deformity or tenderness Skin: No rash or petechiae.   Data Reviewed: I have personally reviewed following labs and imaging studies  CBC: Recent Labs  Lab 09/16/18 2203 09/18/18 1228 09/18/18 1635 09/19/18 0503 09/20/18 0320 09/21/18 0821 09/22/18 0352  WBC 19.3* 20.5* 21.9* 21.9* 20.5* 19.7* 17.3*  NEUTROABS 17.4* 18.8*  --   --  19.0* 17.8* 15.7*  HGB 12.8* 11.4* 11.3* 11.6* 10.7* 11.6* 10.8*  HCT 39.7 35.0* 34.8* 37.3* 33.1* 36.8* 33.3*  MCV 94.7 92.8 93.8 95.9 94.6 94.8 92.8  PLT 568* 420* 377 413* 415* 522* 893*   Basic Metabolic Panel: Recent Labs  Lab 09/18/18 1228 09/18/18 1635 09/19/18 0503 09/20/18 0320 09/21/18 0821 09/22/18 0352  NA 133*  --  135 136 136 135  K 3.9  --  4.3 4.3 3.7 3.5  CL 96*  --  101 101 100 97*  CO2 24  --  27 28 27 29   GLUCOSE 94  --  127* 126* 103* 85  BUN 18  --  16 21 16 11   CREATININE 0.91 0.93 0.78 0.66 0.73 0.58*  CALCIUM 8.4*  --  8.7* 8.9 9.0 8.5*  MG  --   --   --  1.6* 1.7 1.5*   GFR: Estimated Creatinine Clearance: 67.7 mL/min (A) (by C-G formula based on SCr of 0.58 mg/dL (L)).  Liver Function Tests: Recent Labs  Lab 09/16/18 2203 09/18/18 1228 09/20/18 0320  AST 30 43* 29  ALT 17 17 19   ALKPHOS 104 83 72  BILITOT 0.9 1.1 0.5  PROT 6.4* 5.3* 4.7*  ALBUMIN 2.0* 1.7* 1.5*   No results for input(s): LIPASE, AMYLASE in the last 168 hours. No results for input(s): AMMONIA in the last 168 hours. Coagulation Profile: Recent Labs  Lab 09/18/18 1228  INR 1.2   Cardiac Enzymes: No results for input(s): CKTOTAL, CKMB, CKMBINDEX, TROPONINI in the last 168 hours. BNP (last 3 results) No results for input(s): PROBNP in the last 8760 hours. HbA1C: No results for input(s): HGBA1C in the last 72 hours. CBG: No results for input(s): GLUCAP in the last 168 hours. Lipid Profile: No results for input(s): CHOL, HDL, LDLCALC, TRIG, CHOLHDL, LDLDIRECT in the last 72 hours. Thyroid Function Tests: No results for input(s): TSH, T4TOTAL, FREET4, T3FREE, THYROIDAB in the last 72 hours. Anemia Panel: No results for input(s): VITAMINB12, FOLATE, FERRITIN, TIBC, IRON, RETICCTPCT in the last 72 hours. Sepsis Labs: Recent Labs  Lab 09/18/18 1228 09/18/18 1635  LATICACIDVEN  1.5 1.1    Recent Results (from the past 240 hour(s))  SARS Coronavirus 2 (CEPHEID - Performed in [facility] hospital lab), Hosp Order     Status: None   Collection Time: 09/16/18 10:03 PM   Specimen: Nasopharyngeal Swab  Result Value Ref Range Status   SARS Coronavirus 2 NEGATIVE NEGATIVE Final    Comment: (NOTE) If result is NEGATIVE SARS-CoV-2 target nucleic acids are NOT DETECTED. The SARS-CoV-2 RNA is generally detectable in upper and lower  respiratory specimens during the acute phase of infection. The lowest  concentration of SARS-CoV-2 viral copies this assay can detect is 250  copies / mL. A negative result does not preclude SARS-CoV-2 infection  and should not be used as the sole basis for treatment or other  patient management decisions.  A negative result may occur with  improper  specimen collection / handling, submission of specimen other  than nasopharyngeal swab, presence of viral mutation(s) within the  areas targeted by this assay, and inadequate number of viral copies  (<250 copies / mL). A negative result must be combined with clinical  observations, patient history, and epidemiological information. If result is POSITIVE SARS-CoV-2 target nucleic acids are DETECTED. The SARS-CoV-2 RNA is generally detectable in upper and lower  respiratory specimens dur ing the acute phase of infection.  Positive  results are indicative of active infection with SARS-CoV-2.  Clinical  correlation with patient history and other diagnostic information is  necessary to determine patient infection status.  Positive results do  not rule out bacterial infection or co-infection with other viruses. If result is PRESUMPTIVE POSTIVE SARS-CoV-2 nucleic acids MAY BE PRESENT.   A presumptive positive result was obtained on the submitted specimen  and confirmed on repeat testing.  While 2019 novel coronavirus  (SARS-CoV-2) nucleic acids may be present in the submitted sample  additional confirmatory testing may be necessary for epidemiological  and / or clinical management purposes  to differentiate between  SARS-CoV-2 and other Sarbecovirus currently known to infect humans.  If clinically indicated additional testing with an alternate test  methodology [phone]) is advised. The SARS-CoV-2 RNA is generally  detectable in upper and lower respiratory sp ecimens during the acute  phase of infection. The expected result is Negative. Fact Sheet for Patients:  StrictlyIdeas.no Fact Sheet for Healthcare Providers: BankingDealers.co.za This test is not yet approved or cleared by the [place] FDA and has been authorized for detection and/or diagnosis of SARS-CoV-2 by FDA under an Emergency Use Authorization (EUA).  This EUA will remain in  effect (meaning this test can be used) for the duration of the COVID-19 declaration under Section 564(b)(1) of the Act, 21 U.S.C. section 360bbb-3(b)(1), unless the authorization is terminated or revoked sooner. Performed at [facility], [place] [address], [place], [place] [address]   Blood Culture (routine x 2)     Status: None (Preliminary result)   Collection Time: 09/18/18 12:35 PM   Specimen: BLOOD RIGHT FOREARM  Result Value Ref Range Status   Specimen Description BLOOD RIGHT FOREARM  Final   Special Requests   Final    BOTTLES DRAWN AEROBIC AND ANAEROBIC Blood Culture adequate volume   Culture   Final    NO GROWTH 4 DAYS Performed at [facility], [place] [address] [place], [place] [address]    Report Status PENDING  Incomplete  Blood Culture (routine x 2)     Status: None (Preliminary result)   Collection Time: 09/18/18 12:49 PM  Specimen: BLOOD LEFT FOREARM  Result Value Ref Range Status   Specimen Description BLOOD LEFT FOREARM  Final   Special Requests   Final    BOTTLES DRAWN AEROBIC AND ANAEROBIC Blood Culture adequate volume   Culture   Final    NO GROWTH 4 DAYS Performed at [facility], [address], [place], [place] [address]    Report Status PENDING  Incomplete  SARS Coronavirus 2 (CEPHEID - Performed in [facility] hospital lab), Hosp Order     Status: None   Collection Time: 09/18/18  2:47 PM   Specimen: Nasopharyngeal Swab  Result Value Ref Range Status   SARS Coronavirus 2 NEGATIVE NEGATIVE Final    Comment: (NOTE) If result is NEGATIVE SARS-CoV-2 target nucleic acids are NOT DETECTED. The SARS-CoV-2 RNA is generally detectable in upper and lower  respiratory specimens during the acute phase of infection. The lowest  concentration of SARS-CoV-2 viral copies this assay can detect is 250  copies / mL. A negative result does not preclude SARS-CoV-2 infection  and should not be used as the sole basis for treatment or other  patient  management decisions.  A negative result may occur with  improper specimen collection / handling, submission of specimen other  than nasopharyngeal swab, presence of viral mutation(s) within the  areas targeted by this assay, and inadequate number of viral copies  (<250 copies / mL). A negative result must be combined with clinical  observations, patient history, and epidemiological information. If result is POSITIVE SARS-CoV-2 target nucleic acids are DETECTED. The SARS-CoV-2 RNA is generally detectable in upper and lower  respiratory specimens dur ing the acute phase of infection.  Positive  results are indicative of active infection with SARS-CoV-2.  Clinical  correlation with patient history and other diagnostic information is  necessary to determine patient infection status.  Positive results do  not rule out bacterial infection or co-infection with other viruses. If result is PRESUMPTIVE POSTIVE SARS-CoV-2 nucleic acids MAY BE PRESENT.   A presumptive positive result was obtained on the submitted specimen  and confirmed on repeat testing.  While 2019 novel coronavirus  (SARS-CoV-2) nucleic acids may be present in the submitted sample  additional confirmatory testing may be necessary for epidemiological  and / or clinical management purposes  to differentiate between  SARS-CoV-2 and other Sarbecovirus currently known to infect humans.  If clinically indicated additional testing with an alternate test  methodology [phone]) is advised. The SARS-CoV-2 RNA is generally  detectable in upper and lower respiratory sp ecimens during the acute  phase of infection. The expected result is Negative. Fact Sheet for Patients:  StrictlyIdeas.no Fact Sheet for Healthcare Providers: BankingDealers.co.za This test is not yet approved or cleared by the [place] FDA and has been authorized for detection and/or diagnosis of SARS-CoV-2 by FDA under  an Emergency Use Authorization (EUA).  This EUA will remain in effect (meaning this test can be used) for the duration of the COVID-19 declaration under Section 564(b)(1) of the Act, 21 U.S.C. section 360bbb-3(b)(1), unless the authorization is terminated or revoked sooner. Performed at [facility], [place] [address], [place],  [address]   MRSA PCR Screening     Status: None   Collection Time: 09/19/18  1:00 PM   Specimen: Nasal Mucosa; Nasopharyngeal  Result Value Ref Range Status   MRSA by PCR NEGATIVE NEGATIVE Final    Comment:        The GeneXpert MRSA Assay (FDA approved  for NASAL specimens only), is one component of a comprehensive MRSA colonization surveillance program. It is not intended to diagnose MRSA infection nor to guide or monitor treatment for MRSA infections. Performed at [facility], [place] [address], [place], [place] [address]   Urine Culture     Status: None   Collection Time: 09/19/18  8:44 PM   Specimen: In/Out Cath Urine  Result Value Ref Range Status   Specimen Description IN/OUT CATH URINE  Final   Special Requests NONE  Final   Culture   Final    NO GROWTH Performed at [facility], [place] [address] [place], [place] [address]    Report Status 09/21/2018 FINAL  Final         Radiology Studies: No results found.      Scheduled Meds: . bromocriptine  2.5 mg Oral BID  . calcium-vitamin D  1 tablet Oral BID  . enoxaparin (LOVENOX) injection  40 mg Subcutaneous Q24H  . feeding supplement (ENSURE ENLIVE)  237 mL Oral BID BM  . folic acid  2 mg Oral Daily  . hydrocortisone  20 mg Oral Q breakfast   And  . hydrocortisone  10 mg Oral QAC supper  . ipratropium-albuterol  3 mL Nebulization Q6H  . levothyroxine  137 mcg Oral Q0600  . methotrexate  10 mg Oral Once per day on Sun Sat  . nystatin  5 mL Oral QID  . pantoprazole  40 mg Oral BID  . pravastatin  80 mg Oral Daily   Continuous Infusions: . azithromycin 250  mL/hr at 09/21/18 1829  . ceFEPime (MAXIPIME) IV Stopped (09/22/18 0225)     LOS: 4 days        Aline August, MD [facility] 09/22/2018, 8:15 AM

## 2018-09-23 ENCOUNTER — Inpatient Hospital Stay (HOSPITAL_COMMUNITY): Payer: Medicare Other

## 2018-09-23 DIAGNOSIS — J984 Other disorders of lung: Secondary | ICD-10-CM

## 2018-09-23 LAB — CULTURE, BLOOD (ROUTINE X 2)
Culture: NO GROWTH
Culture: NO GROWTH
Special Requests: ADEQUATE
Special Requests: ADEQUATE

## 2018-09-23 LAB — CBC WITH DIFFERENTIAL/PLATELET
Abs Immature Granulocytes: 0.19 10*3/uL — ABNORMAL HIGH (ref 0.00–0.07)
Basophils Absolute: 0 10*3/uL (ref 0.0–0.1)
Basophils Relative: 0 %
Eosinophils Absolute: 0.1 10*3/uL (ref 0.0–0.5)
Eosinophils Relative: 1 %
HCT: 32.8 % — ABNORMAL LOW (ref 39.0–52.0)
Hemoglobin: 10.5 g/dL — ABNORMAL LOW (ref 13.0–17.0)
Immature Granulocytes: 1 %
Lymphocytes Relative: 5 %
Lymphs Abs: 0.9 10*3/uL (ref 0.7–4.0)
MCH: 30.4 pg (ref 26.0–34.0)
MCHC: 32 g/dL (ref 30.0–36.0)
MCV: 95.1 fL (ref 80.0–100.0)
Monocytes Absolute: 0.3 10*3/uL (ref 0.1–1.0)
Monocytes Relative: 1 %
Neutro Abs: 16.1 10*3/uL — ABNORMAL HIGH (ref 1.7–7.7)
Neutrophils Relative %: 92 %
Platelets: 508 10*3/uL — ABNORMAL HIGH (ref 150–400)
RBC: 3.45 MIL/uL — ABNORMAL LOW (ref 4.22–5.81)
RDW: 15.9 % — ABNORMAL HIGH (ref 11.5–15.5)
WBC: 17.7 10*3/uL — ABNORMAL HIGH (ref 4.0–10.5)
nRBC: 0 % (ref 0.0–0.2)

## 2018-09-23 LAB — BASIC METABOLIC PANEL
Anion gap: 9 (ref 5–15)
BUN: 13 mg/dL (ref 8–23)
CO2: 34 mmol/L — ABNORMAL HIGH (ref 22–32)
Calcium: 8.5 mg/dL — ABNORMAL LOW (ref 8.9–10.3)
Chloride: 91 mmol/L — ABNORMAL LOW (ref 98–111)
Creatinine, Ser: 0.75 mg/dL (ref 0.61–1.24)
GFR calc Af Amer: >60 mL/min (ref >60)
GFR calc non Af Amer: >60 mL/min (ref >60)
Glucose, Bld: 88 mg/dL (ref 70–99)
Potassium: 3 mmol/L — ABNORMAL LOW (ref 3.5–5.1)
Sodium: 134 mmol/L — ABNORMAL LOW (ref 135–145)

## 2018-09-23 LAB — MAGNESIUM: Magnesium: 1.8 mg/dL (ref 1.7–2.4)

## 2018-09-23 MED ORDER — MAGNESIUM SULFATE 2 GM/50ML IV SOLN
2.0000 g | Freq: Once | INTRAVENOUS | Status: AC
  Administered 2018-09-23: 2 g via INTRAVENOUS
  Filled 2018-09-23: qty 50

## 2018-09-23 MED ORDER — POTASSIUM CHLORIDE CRYS ER 20 MEQ PO TBCR
40.0000 meq | EXTENDED_RELEASE_TABLET | ORAL | Status: AC
  Administered 2018-09-23 (×2): 40 meq via ORAL
  Filled 2018-09-23 (×2): qty 2

## 2018-09-23 MED ORDER — RESOURCE THICKENUP CLEAR PO POWD
ORAL | Status: DC | PRN
  Filled 2018-09-23: qty 125

## 2018-09-23 MED ORDER — FUROSEMIDE 10 MG/ML IJ SOLN
60.0000 mg | Freq: Once | INTRAMUSCULAR | Status: AC
  Administered 2018-09-23: 60 mg via INTRAVENOUS
  Filled 2018-09-23: qty 6

## 2018-09-23 NOTE — Progress Notes (Signed)
NAME:  Ryan Coffey, MRN:  [id number], DOB:  1932/06/14, LOS: 5 ADMISSION DATE:  09/18/2018, CONSULTATION DATE:  09/22/2018 REFERRING MD:  Remi Haggard, CHIEF COMPLAINT:  Weakness, SOB   Brief History   85 yo seen by Dr. Vaughan Browner, exhibiting worsening hypoxia inpatient and CXR with concern for cavitary lung lesion RML   History of present illness   85 yo M PMH renal cell carcinoma (believed to be in remission), possible COPD (symbicort, no home O2), HLD, RA (on MTX), pituitary tumor, PNA (hospitalized 05/2018)  who presents to hospital 7/16 with complains of SOB and weakness. Patient noted associated white coating on tongue and sore throat  causing poor PO intake, green colored sputum x 3 months. Patient endorsed cough x 5-6 months. Patient denied chest pain, palpitations, PND, orthopnea, nausea, vomiting, diarrhea. He stated in ED that he might be dizzy. He denied falls.   Of note, patient presented to ED 2 days prior for CC weakness and was found to have PNA and renal insufficiency. Admission was recommended at this time however patient elected to dc home after IVF, and was prescribed levaquin for PNA however patient did not fill this prescription.   Patient was seen by Dr. Vaughan Browner, [facility], in June/2020 and a high resolution CT chest revealed extensive, somewhat mass-like consolidation of R lung, centered about superior segment of RLL and involving RML.   Past Medical History  ? COPD PNA Renal cell carcinoma  RA  Significant Hospital Events   7/16> admitted 7/20> PCCM consulted   Consults:  PCCM   Procedures:   Significant Diagnostic Tests:  CT Chest 09/22/2018>> large, cavitary lung lesion centered in the superior segment right lower lobe with an internal air and fluid level, measuring approximately 8.4 x 6.6 x 6.6 cm (series 4, image 67, series 6, image 37). There is extensive heterogeneous airspace disease of the right lower and middle lobes as well as a small right  pleural effusion. Cavitation is new in comparison to prior examination dated 09/12/2018 and adjacent airspace disease is generally worsened and more consolidative. Findings are most consistent with worsening, cavitating pneumonia.  CT chest 7/10 > extensive mass-like consolidation of dependent R lung, centered about superior segment RLL and involving RML. Severe emphysema.   CXR 7/20> RML RLL infiltrates with slight progression from prior. Possible cavitation within RML infiltrate   Micro Data:  7/16 SARS CoV2 > neg  7/17 MRSA> neg  7/17 UCx > no growth 7/17 BCx No growth   Antimicrobials:  Vanc 7/16>>  Cefepime 7/16 >>   Interim history/subjective:  On 5L HFNC   Objective   Blood pressure (!) 154/69, pulse 99, temperature 98.7 F (37.1 C), temperature source Oral, resp. rate 20, height 5\' 8"  (1.727 m), weight 69.3 kg, SpO2 96 %.        Intake/Output Summary (Last 24 hours) at 09/23/2018 0951 Last data filed at 09/23/2018 0700 Gross per 24 hour  Intake 1544 ml  Output 2925 ml  Net -1381 ml   Filed Weights   09/20/18 0530 09/21/18 0524 09/23/18 0617  Weight: 72.3 kg 73 kg 69.3 kg    Examination: General: chronically ill appearing older adult M, supine in bed NAD, coughing at intervals HENT: NCAT. NCAT, Pink mm, mild glossitis. No post-nasal drip at time of evaluation, patent nares with [place] in place , NO LAD Lungs: R sided crackles. Symmetrical chest expansion, no accessory muscle recruitment, coarse breath sounds diminished per bases. Gray thick secretions noted at bedside Cardiovascular:  RRR s1s2 no rgm. Trace BLE edema , Tachy per tele Abdomen: soft flat nd , nt. + bowel sounds x4  Extremities: L hand chronic arthritic changes. Symmetrical muscle bulk and tone. No cyanosis no clubbing , no obvious deformities Neuro: AAOx4. PERRL. Following commands., deconditioned at baseline Psych: appropriate for age and situation. Anxious to follow suggestions to get well. GU:  clear light yellow urine collecting in bedside urinal   Resolved Hospital Problem list    HCAP: multilobar right sided pneumonia. Location somewhat concerning for aspiration although SLP evaluation does not seem to endorse this. There is some cavitary component of this with air fluid level developing. Cultures negative so far. He is immunocompromised due to methotrexate for 25 yrs for RA. Spiking fevers in the AM hours of 7/20. Swallow Eval 7/17>> passed the [facility] Swallowing Protocol suggesting the low likelihood for aspiration.  Recommend >> continue on a regular diet with thin liquids.  Consider MBS if patient's lungs are not clearing as anticipated.   - Supplemental O2 to keep SpO2 > 92% (cuurently on 5L  [place]) - Sputum For culture ( per patient collected 7.21 am) - Trend MAG:  goals > 2.0 - Repeat CT 7/20 confirms progression of cavitary pneumonia with new cavitary lesion within the last 10 days and  adjacent airspace disease is generally worsened and more consolidative - Continue antibiotics. Continue  clindamycin considering aspiration concern and progressive  cavitary changes.  Will need At least 6 weeks of ABX then follow up CT as an outpatient.  - Aggressive Pulmonary Toilet as able ( IS, Flutter, OOB to chair) - PT/ OT when able -  MBS scheduled for 2 pm today as patient has had progression of disease, and is coughing    COPD concern for COPD based on emphysematous changes on imaging.  PFT pending.  Followed by Dr. Vaughan Browner, who has started a trial of Trelegy.  Peripheral eosinophils 221.  He has been unable to tolerate inhalers due to mouth/throat pain.  Plan  - Continue duoneb - Will add budesonide nebs as well.   Suspect ILD r/t RA - Supplemental O2 as need to keep SpO2 > 92% - Will need follow up at Pulmonary Clinic once discharged   Best practice:  Diet: Per primary/SLP Pain/Anxiety/Delirium protocol (if indicated): NA VAP protocol (if indicated): NA DVT prophylaxis:  enoxaparin  GI prophylaxis: Pantoprazole  Glucose control: NA Mobility: Per primary Code Status: DNR Family Communication: patient updated Disposition: medical floor.   Labs   CBC: Recent Labs  Lab 09/18/18 1228  09/19/18 0503 09/20/18 0320 09/21/18 5170 09/22/18 0352 09/23/18 0521  WBC 20.5*   < > 21.9* 20.5* 19.7* 17.3* 17.7*  NEUTROABS 18.8*  --   --  19.0* 17.8* 15.7* 16.1*  HGB 11.4*   < > 11.6* 10.7* 11.6* 10.8* 10.5*  HCT 35.0*   < > 37.3* 33.1* 36.8* 33.3* 32.8*  MCV 92.8   < > 95.9 94.6 94.8 92.8 95.1  PLT 420*   < > 413* 415* 522* 514* 508*   < > = values in this interval not displayed.    Basic Metabolic Panel: Recent Labs  Lab 09/19/18 0503 09/20/18 0320 09/21/18 0821 09/22/18 0352 09/23/18 0521  NA 135 136 136 135 134*  K 4.3 4.3 3.7 3.5 3.0*  CL 101 101 100 97* 91*  CO2 27 28 27 29  34*  GLUCOSE 127* 126* 103* 85 88  BUN 16 21 16 11 13   CREATININE 0.78 0.66 0.73 0.58* 0.75  CALCIUM 8.7* 8.9 9.0 8.5* 8.5*  MG  --  1.6* 1.7 1.5* 1.8   GFR: Estimated Creatinine Clearance: 67.7 mL/min (by C-G formula based on SCr of 0.75 mg/dL). Recent Labs  Lab 09/18/18 1228 09/18/18 1635  09/20/18 0320 09/21/18 0821 09/22/18 0352 09/23/18 0521  WBC 20.5* 21.9*   < > 20.5* 19.7* 17.3* 17.7*  LATICACIDVEN 1.5 1.1  --   --   --   --   --    < > = values in this interval not displayed.    Liver Function Tests: Recent Labs  Lab 09/16/18 2203 09/18/18 1228 09/20/18 0320  AST 30 43* 29  ALT 17 17 19   ALKPHOS 104 83 72  BILITOT 0.9 1.1 0.5  PROT 6.4* 5.3* 4.7*  ALBUMIN 2.0* 1.7* 1.5*   No results for input(s): LIPASE, AMYLASE in the last 168 hours. No results for input(s): AMMONIA in the last 168 hours.  ABG    Component Value Date/Time   PHART 7.417 08/25/2006 0500   PCO2ART 38.4 08/25/2006 0500   PO2ART 45.7 (L) 08/25/2006 0500   HCO3 24.3 (H) 08/25/2006 0500   TCO2 25.4 08/25/2006 0500   O2SAT 82.8 08/25/2006 0500     Coagulation Profile:  Recent Labs  Lab 09/18/18 1228  INR 1.2    Cardiac Enzymes: No results for input(s): CKTOTAL, CKMB, CKMBINDEX, TROPONINI in the last 168 hours.  HbA1C: No results found for: HGBA1C  CBG: No results for input(s): GLUCAP in the last 168 hours.  Review of Systems:   As per HPI   Past Medical History  He,  has a past medical history of Arthritis, Benign tumor of pituitary gland ([place]), COPD (chronic obstructive pulmonary disease) ([place]), Pneumonia (06/27/11), and Shingles.   Surgical History    Past Surgical History:  Procedure Laterality Date  . CHOLECYSTECTOMY OPEN  2010  . KIDNEY SURGERY  2011   "lesion removed right side"  . RENAL BIOPSY  2012   right  . TRANSPHENOIDAL / TRANSNASAL HYPOPHYSECTOMY / RESECTION PITUITARY TUMOR  ~ 1979   "they couldn't get it all"     Social History   reports that he quit smoking about 38 years ago. His smoking use included cigarettes. He has a 5.00 pack-year smoking history. He has never used smokeless tobacco. He reports current alcohol use of about 3.0 standard drinks of alcohol per week. He reports that he does not use drugs.   Family History   His family history includes Cancer in his brother; Heart disease in his brother and mother.   Allergies No Known Allergies   Home Medications  Prior to Admission medications   Medication Sig Start Date End Date Taking? Authorizing Provider  acetaminophen (TYLENOL) 325 MG tablet Take 650 mg by mouth 2 (two) times daily as needed (arthritis).   Yes [provider]  bromocriptine (PARLODEL) 2.5 MG tablet Take 2.5 mg by mouth 2 (two) times daily.   Yes [provider]  budesonide-formoterol (SYMBICORT) 80-4.5 MCG/ACT inhaler Inhale 2 puffs into the lungs 2 (two) times a day. 09/11/18  Yes Fenton Foy, NP  Calcium Carbonate-Vitamin D (CALCIUM 500 + D) 500-125 MG-UNIT TABS Take 1 tablet by mouth 2 (two) times daily.   Yes [provider]  folic acid (FOLVITE) 1 MG tablet  TAKE 2 TABLETS EVERY DAY Patient taking differently: Take 2 mg by mouth daily.  08/04/18  Yes Deveshwar, Abel Presto, MD  hydrocortisone (CORTEF) 20 MG tablet Take 10-20 mg by mouth daily.  20 mg in the morning and 10mg  at night 11/02/16  Yes [provider]  levofloxacin (LEVAQUIN) 750 MG tablet Take 1 tablet (750 mg total) by mouth daily for 7 days. 09/16/18 09/23/18 Yes Carmin Muskrat, MD  levothyroxine (SYNTHROID, LEVOTHROID) 137 MCG tablet Take 137 mcg by mouth daily.     Yes [provider]  methotrexate (RHEUMATREX) 2.5 MG tablet TAKE 4 TABLETS ON SATURDAY AND 4 TABLETS ON SUNDAY. CAUTION:CHEMOTHERAPY. PROTECT FROM LIGHT. Patient taking differently: Take 10 mg by mouth See admin instructions. TAKE 4 tablets on Saturday and 4 tablets on Sunday. Caution:Chemotherapy. Protect from light. 09/10/18  Yes Deveshwar, Abel Presto, MD  pantoprazole (PROTONIX) 40 MG tablet Take 40 mg by mouth 2 (two) times daily.   Yes [provider]  pravastatin (PRAVACHOL) 80 MG tablet Take 80 mg by mouth daily.   Yes [provider]  testosterone cypionate (DEPOTESTOSTERONE CYPIONATE) 200 MG/ML injection Inject 75 mg into the muscle every 14 (fourteen) days.  05/24/16  Yes [provider]  zolpidem (AMBIEN CR) 6.25 MG CR tablet Take 1 tablet (6.25 mg total) by mouth at bedtime as needed for sleep. 05/20/18  Yes Shelly Coss, MD      Magdalen Spatz,  MSN, AGACNP-BC [place] [phone] After 4 pm please call [phone] 09/23/2018, 9:51 AM

## 2018-09-23 NOTE — Plan of Care (Signed)
  Problem: Clinical Measurements: Goal: Ability to maintain clinical measurements within normal limits will improve Outcome: Progressing Goal: Respiratory complications will improve Outcome: Progressing   

## 2018-09-23 NOTE — Progress Notes (Signed)
Modified Barium Swallow Progress Note  Patient Details  Name: Ryan Coffey MRN: [id number] Date of Birth: January 27, 1933  Today's Date: 09/23/2018  Modified Barium Swallow completed.  Full report located under Chart Review in the Imaging Section.  Brief recommendations include the following:  Clinical Impression  Pt presents with a mild pharyngeal dysphagia characterized by impaired timing with thin liquids that leads to trace amounts of penetration during the swallow. Although this is not necessarily atypical for his age, his cough is weak and he is not able to eject penetrates despite volitional coughing. When he takes sequential boluses of thin liquids, there is also trace amounts of silent aspiration. The amount of liquid that enters his airway is very small, but there is certainly the potential for him to aspirate across the entirety of a meal, particularly if he is not fully upright or if his respiratory status is compromised. Airway protection is improved with nectar thick liquids, and it is also improved with thin liquids with use of a brief oral hold before swallowing. Mild vallecular residue is present across consistencies, increasing mildly with more solid boluses. Given his recurrent PNA that is also acutely worsening, recommend regular diet and nectar thick liquids for now. Potential may be good to advance back to thin liquids with use of strategies as he overcomes acute illness. Would also recommend EMST to maximize swallow and cough.   Swallow Evaluation Recommendations       SLP Diet Recommendations: Regular solids;Nectar thick liquid   Liquid Administration via: Cup   Medication Administration: Whole meds with puree   Supervision: Patient able to self feed;Intermittent supervision to cue for compensatory strategies   Compensations: Slow rate;Small sips/bites   Postural Changes: Seated upright at 90 degrees   Oral Care Recommendations: Oral care BID   Other  Recommendations: Order thickener from pharmacy;Prohibited food (jello, ice cream, thin soups);Remove water pitcher    Ryan Coffey 09/23/2018,3:44 PM   Pollyann Glen, M.A. [place] Acute Environmental education officer [phone] Office [phone]

## 2018-09-23 NOTE — Progress Notes (Signed)
RT instructed patient how to use IS. Patient was able to perform1500 ml's on IS with great effort.

## 2018-09-23 NOTE — Progress Notes (Addendum)
Patient ID: Ryan Coffey, male   DOB: 04/24/1932, 86 y.o.   MRN: [id number]  PROGRESS NOTE    Ryan Coffey  TFT:[id number] DOB: [birth year] DOA: 09/18/2018 PCP: Prince Solian, MD   Brief Narrative:  85 year old male with history of renal cell carcinoma in remission, COPD, pediatric tumor, hyperlipidemia, rheumatoid arthritis on methotrexate, pneumonia in March 2020 who presented to the ED 2 days ago for treatment of weakness and was found to have right lower lobe infiltrate but patient was discharged home on oral Levaquin as per patient's request.  Patient presented again on 09/18/2018 with worsening weakness, cough, very poor appetite and difficulty swallowing.  He had a recent high-resolution CT chest done on 09/12/2018 as an outpatient as per Dr. Matilde Bash request which showed extensive somewhat masslike consolidation of the dependent right lung, centered about the superior segment of the right lower lobe although involving the right middle lobe as well.  Findings likely reflected infection or aspiration, although underlying mass is not excluded.  He was found to be febrile in the ED along with hypoxia and leukocytosis.  Patient was started on broad-spectrum antibiotics.  Pulmonary was consulted on 09/22/2018 as respiratory status worsened and patient had a temperature spike.  Assessment & Plan:   Acute hypoxic respiratory failure Healthcare associated pneumonia/cavitary pneumonia -Still requiring 6 L high flow nasal cannula oxygen.  Respiratory status worse this morning; requiring 6 to 8 L high flow nasal cannula.  No temperature spikes since yesterday morning.  Does not use oxygen at home.  Repeat cultures from 09/22/2018 are negative so far.  Chest x-ray done on 09/22/2018 had shown persistent right-sided infiltrates with probable cavity.  Patient was restarted on vancomycin; cefepime was continued.  Zithromax was discontinued.  1 dose of IV Lasix was also given.  Pulmonary was consulted.  Will  give 1 more dose of Lasix today. -CT of the chest done on 09/22/2018 as per pulmonary recommendations showed worsening cavitary pneumonia.  Pulmonary recommended at least 6 weeks of antibiotics then follow-up CT as an outpatient.  Antibiotics were switched to clindamycin alone by pulmonary on 09/22/2018. -Strep pneumo urinary antigen negative as well.  Urine Legionella antigen negative.   Cultures negative so far.  Rapid COVID testing negative. -Incentive spirometry. -Diet as per SLP recommendations.  COPD -Continue budesonide and DuoNebs  Leukocytosis -Probably from above.  Persistent.  Monitor.  Hyponatremia -Probably from decreased p.o. intake.  Improved.  Off IV fluids.  Encourage oral intake  Hypomagnesemia -Improved  Hypokalemia -Replace.  Repeat a.m. 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  Outpatient follow-up  Hypothyroidism -Continue Synthroid  Stage I pressure injury on the coccyx: Present on admission -Follow local wound care  Hypoalbuminemia -Due to poor recent oral intake.  Nutrition consult  Generalized deconditioning--PT recommending home health PT.   DVT prophylaxis: Lovenox Code Status: Full Family Communication: Spoke to daughter/Sharon on phone on 09/20/2018 and on 09/21/2018.  Spoke to daughter Mal Amabile on 09/22/2018 Disposition Plan: Home with home health in 1 to 3 days if clinically improves.  Consultants: None  Procedures:  Echo IMPRESSIONS    1. The left ventricle has normal systolic function with an ejection fraction of 60-65%. The cavity size was normal. Left ventricular diastolic Doppler parameters are consistent with impaired relaxation.  2. The right ventricle has normal systolic function. The cavity was moderately enlarged.  3. The mitral valve  is abnormal. There is  mild mitral annular calcification present.  4. The tricuspid valve is grossly normal.  5. The aortic valve is tricuspid. Mild thickening of the aortic valve. No stenosis of the aortic valve.  6. There is left bowing of the interatrial septum, suggestive of elevated right atrial pressure.  7. Right atrial size was mildly dilated.  8. The inferior vena cava was dilated in size with >50% respiratory variability.  9. Normal LV systolic function; mild diastolic dysfunction; septal flattening suggesting elevated pulmonary pressure; mild RAE; moderate RVE. 10. The aortic root is normal in size and structure.  Antimicrobials:  Cefepime and Zithromax from 09/18/2018-09/22/2018 Vancomycin from 09/18/2018-09/20/2018.  Received 1 dose of vancomycin on 09/21/2016 Clindamycin from 09/22/2018 onwards  Subjective: Patient seen and examined at bedside.  Denies overnight fever, nausea or vomiting.  Still short of breath with cough.  Feels very weak. Objective: Vitals:   09/22/18 1912 09/22/18 2028 09/23/18 0129 09/23/18 0617  BP:  128/61  (!) 154/69  Pulse:  98  99  Resp:  20    Temp:  99.1 F (37.3 C)  98.7 F (37.1 C)  TempSrc:  Oral  Oral  SpO2: 96% 98% 94% 100%  Weight:    69.3 kg  Height:        Intake/Output Summary (Last 24 hours) at 09/23/2018 0759 Last data filed at 09/23/2018 2297 Gross per 24 hour  Intake 1304 ml  Output 3125 ml  Net -1821 ml   Filed Weights   09/20/18 0530 09/21/18 0524 09/23/18 0617  Weight: 72.3 kg 73 kg 69.3 kg    Examination:  General exam: No distress.  Looks very thinly built ENT: No JVD Lymph: No cervical lymphadenopathy Respiratory system: Bilateral decreased breath sounds at bases with scattered crackles.   Cardiovascular system: S1-S2 heard, rate controlled  gastrointestinal system: Abdomen is nondistended, soft and nontender. Normal bowel sounds heard. Extremities: No lower extremity edema.  No cyanosis CNS: Alert awake and oriented.  No focal  neurologic deficit Psychiatry: Slightly anxious Musculoskeletal: No joint swelling, deformity or tenderness Skin: No rash or petechiae.   Data Reviewed: I have personally reviewed following labs and imaging studies  CBC: Recent Labs  Lab 09/18/18 1228  09/19/18 0503 09/20/18 0320 09/21/18 0821 09/22/18 0352 09/23/18 0521  WBC 20.5*   < > 21.9* 20.5* 19.7* 17.3* 17.7*  NEUTROABS 18.8*  --   --  19.0* 17.8* 15.7* 16.1*  HGB 11.4*   < > 11.6* 10.7* 11.6* 10.8* 10.5*  HCT 35.0*   < > 37.3* 33.1* 36.8* 33.3* 32.8*  MCV 92.8   < > 95.9 94.6 94.8 92.8 95.1  PLT 420*   < > 413* 415* 522* 514* 508*   < > = values in this interval not displayed.   Basic Metabolic Panel: Recent Labs  Lab 09/19/18 0503 09/20/18 0320 09/21/18 0821 09/22/18 0352 09/23/18 0521  NA 135 136 136 135 134*  K 4.3 4.3 3.7 3.5 3.0*  CL 101 101 100 97* 91*  CO2 27 28 27 29  34*  GLUCOSE 127* 126* 103* 85 88  BUN 16 21 16 11 13   CREATININE 0.78 0.66 0.73 0.58* 0.75  CALCIUM 8.7* 8.9 9.0 8.5* 8.5*  MG  --  1.6* 1.7 1.5* 1.8   GFR: Estimated Creatinine Clearance: 67.7 mL/min (by C-G formula based on SCr of 0.75 mg/dL). Liver Function Tests: Recent Labs  Lab 09/16/18 2203 09/18/18 1228 09/20/18 0320  AST 30 43* 29  ALT 17 17  19  ALKPHOS 104 83 72  BILITOT 0.9 1.1 0.5  PROT 6.4* 5.3* 4.7*  ALBUMIN 2.0* 1.7* 1.5*   No results for input(s): LIPASE, AMYLASE in the last 168 hours. No results for input(s): AMMONIA in the last 168 hours. Coagulation Profile: Recent Labs  Lab 09/18/18 1228  INR 1.2   Cardiac Enzymes: No results for input(s): CKTOTAL, CKMB, CKMBINDEX, TROPONINI in the last 168 hours. BNP (last 3 results) No results for input(s): PROBNP in the last 8760 hours. HbA1C: No results for input(s): HGBA1C in the last 72 hours. CBG: No results for input(s): GLUCAP in the last 168 hours. Lipid Profile: No results for input(s): CHOL, HDL, LDLCALC, TRIG, CHOLHDL, LDLDIRECT in the last 72  hours. Thyroid Function Tests: No results for input(s): TSH, T4TOTAL, FREET4, T3FREE, THYROIDAB in the last 72 hours. Anemia Panel: No results for input(s): VITAMINB12, FOLATE, FERRITIN, TIBC, IRON, RETICCTPCT in the last 72 hours. Sepsis Labs: Recent Labs  Lab 09/18/18 1228 09/18/18 1635  LATICACIDVEN 1.5 1.1    Recent Results (from the past 240 hour(s))  SARS Coronavirus 2 (CEPHEID - Performed in [facility], [place] hospital lab), Hosp Order     Status: None   Collection Time: 09/16/18 10:03 PM   Specimen: Nasopharyngeal Swab  Result Value Ref Range Status   SARS Coronavirus 2 NEGATIVE NEGATIVE Final    Comment: (NOTE) If result is NEGATIVE SARS-CoV-2 target nucleic acids are NOT DETECTED. The SARS-CoV-2 RNA is generally detectable in upper and lower  respiratory specimens during the acute phase of infection. The lowest  concentration of SARS-CoV-2 viral copies this assay can detect is 250  copies / mL. A negative result does not preclude SARS-CoV-2 infection  and should not be used as the sole basis for treatment or other  patient management decisions.  A negative result may occur with  improper specimen collection / handling, submission of specimen other  than nasopharyngeal swab, presence of viral mutation(s) within the  areas targeted by this assay, and inadequate number of viral copies  (<250 copies / mL). A negative result must be combined with clinical  observations, patient history, and epidemiological information. If result is POSITIVE SARS-CoV-2 target nucleic acids are DETECTED. The SARS-CoV-2 RNA is generally detectable in upper and lower  respiratory specimens dur ing the acute phase of infection.  Positive  results are indicative of active infection with SARS-CoV-2.  Clinical  correlation with patient history and other diagnostic information is  necessary to determine patient infection status.  Positive results do  not rule out bacterial infection or co-infection with  other viruses. If result is PRESUMPTIVE POSTIVE SARS-CoV-2 nucleic acids MAY BE PRESENT.   A presumptive positive result was obtained on the submitted specimen  and confirmed on repeat testing.  While 2019 novel coronavirus  (SARS-CoV-2) nucleic acids may be present in the submitted sample  additional confirmatory testing may be necessary for epidemiological  and / or clinical management purposes  to differentiate between  SARS-CoV-2 and other Sarbecovirus currently known to infect humans.  If clinically indicated additional testing with an alternate test  methodology [phone]) is advised. The SARS-CoV-2 RNA is generally  detectable in upper and lower respiratory sp ecimens during the acute  phase of infection. The expected result is Negative. Fact Sheet for Patients:  StrictlyIdeas.no Fact Sheet for Healthcare Providers: BankingDealers.co.za This test is not yet approved or cleared by the [place] FDA and has been authorized for detection and/or diagnosis of SARS-CoV-2 by FDA under an Emergency  Use Authorization (EUA).  This EUA will remain in effect (meaning this test can be used) for the duration of the COVID-19 declaration under Section 564(b)(1) of the Act, 21 U.S.C. section 360bbb-3(b)(1), unless the authorization is terminated or revoked sooner. Performed at [facility], [place] [address] [place], [place] [address]   Blood Culture (routine x 2)     Status: None   Collection Time: 09/18/18 12:35 PM   Specimen: BLOOD RIGHT FOREARM  Result Value Ref Range Status   Specimen Description BLOOD RIGHT FOREARM  Final   Special Requests   Final    BOTTLES DRAWN AEROBIC AND ANAEROBIC Blood Culture adequate volume   Culture   Final    NO GROWTH 5 DAYS Performed at [facility], [place] [address], [place], [place] [address]    Report Status 09/23/2018 FINAL  Final  Blood Culture (routine x 2)     Status: None   Collection  Time: 09/18/18 12:49 PM   Specimen: BLOOD LEFT FOREARM  Result Value Ref Range Status   Specimen Description BLOOD LEFT FOREARM  Final   Special Requests   Final    BOTTLES DRAWN AEROBIC AND ANAEROBIC Blood Culture adequate volume   Culture   Final    NO GROWTH 5 DAYS Performed at [facility], [place] [address] [place], [place] [address]    Report Status 09/23/2018 FINAL  Final  SARS Coronavirus 2 (CEPHEID - Performed in [place] hospital lab), Hosp Order     Status: None   Collection Time: 09/18/18  2:47 PM   Specimen: Nasopharyngeal Swab  Result Value Ref Range Status   SARS Coronavirus 2 NEGATIVE NEGATIVE Final    Comment: (NOTE) If result is NEGATIVE SARS-CoV-2 target nucleic acids are NOT DETECTED. The SARS-CoV-2 RNA is generally detectable in upper and lower  respiratory specimens during the acute phase of infection. The lowest  concentration of SARS-CoV-2 viral copies this assay can detect is 250  copies / mL. A negative result does not preclude SARS-CoV-2 infection  and should not be used as the sole basis for treatment or other  patient management decisions.  A negative result may occur with  improper specimen collection / handling, submission of specimen other  than nasopharyngeal swab, presence of viral mutation(s) within the  areas targeted by this assay, and inadequate number of viral copies  (<250 copies / mL). A negative result must be combined with clinical  observations, patient history, and epidemiological information. If result is POSITIVE SARS-CoV-2 target nucleic acids are DETECTED. The SARS-CoV-2 RNA is generally detectable in upper and lower  respiratory specimens dur ing the acute phase of infection.  Positive  results are indicative of active infection with SARS-CoV-2.  Clinical  correlation with patient history and other diagnostic information is  necessary to determine patient infection status.  Positive results do  not rule out bacterial  infection or co-infection with other viruses. If result is PRESUMPTIVE POSTIVE SARS-CoV-2 nucleic acids MAY BE PRESENT.   A presumptive positive result was obtained on the submitted specimen  and confirmed on repeat testing.  While 2019 novel coronavirus  (SARS-CoV-2) nucleic acids may be present in the submitted sample  additional confirmatory testing may be necessary for epidemiological  and / or clinical management purposes  to differentiate between  SARS-CoV-2 and other Sarbecovirus currently known to infect humans.  If clinically indicated additional testing with an alternate test  methodology [phone]) is advised. The SARS-CoV-2 RNA is generally  detectable in upper and lower respiratory sp ecimens during the acute  phase of infection. The expected result is Negative. Fact Sheet for Patients:  StrictlyIdeas.no Fact Sheet for Healthcare Providers: BankingDealers.co.za This test is not yet approved or cleared by the [place] FDA and has been authorized for detection and/or diagnosis of SARS-CoV-2 by FDA under an Emergency Use Authorization (EUA).  This EUA will remain in effect (meaning this test can be used) for the duration of the COVID-19 declaration under Section 564(b)(1) of the Act, 21 U.S.C. section 360bbb-3(b)(1), unless the authorization is terminated or revoked sooner. Performed at [facility], [place] [address] [place], [place] [address]   MRSA PCR Screening     Status: None   Collection Time: 09/19/18  1:00 PM   Specimen: Nasal Mucosa; Nasopharyngeal  Result Value Ref Range Status   MRSA by PCR NEGATIVE NEGATIVE Final    Comment:        The GeneXpert MRSA Assay (FDA approved for NASAL specimens only), is one component of a comprehensive MRSA colonization surveillance program. It is not intended to diagnose MRSA infection nor to guide or monitor treatment for MRSA infections. Performed at [facility], [place] [address], [place], [place] [address]   Urine Culture     Status: None   Collection Time: 09/19/18  8:44 PM   Specimen: In/Out Cath Urine  Result Value Ref Range Status   Specimen Description IN/OUT CATH URINE  Final   Special Requests NONE  Final   Culture   Final    NO GROWTH Performed at [facility], [place] [address] [place], [place] [address]    Report Status 09/21/2018 FINAL  Final  Culture, blood (routine x 2)     Status: None (Preliminary result)   Collection Time: 09/22/18  8:48 AM   Specimen: BLOOD RIGHT HAND  Result Value Ref Range Status   Specimen Description BLOOD RIGHT HAND  Final   Special Requests   Final    BOTTLES DRAWN AEROBIC ONLY Blood Culture results may not be optimal due to an inadequate volume of blood received in culture bottles   Culture   Final    NO GROWTH < 24 HOURS Performed at [facility], [place] [address] [place], [place] [address]    Report Status PENDING  Incomplete  Culture, blood (routine x 2)     Status: None (Preliminary result)   Collection Time: 09/22/18  8:48 AM   Specimen: BLOOD RIGHT HAND  Result Value Ref Range Status   Specimen Description BLOOD RIGHT HAND  Final   Special Requests   Final    BOTTLES DRAWN AEROBIC ONLY Blood Culture adequate volume   Culture   Final    NO GROWTH < 24 HOURS Performed at [facility], [place] [address], [place], [place] [address]    Report Status PENDING  Incomplete  Expectorated sputum assessment w rflx to resp cult     Status: None (Preliminary result)   Collection Time: 09/23/18  5:26 AM   Specimen: Expectorated Sputum  Result Value Ref Range Status   Specimen Description EXPECTORATED SPUTUM  Final   Special Requests NONE  Final   Sputum evaluation   Final    THIS SPECIMEN IS ACCEPTABLE FOR SPUTUM CULTURE Performed at [facility], [place] [address], [place],  [address]    Report Status PENDING  Incomplete  Culture, respiratory     Status: None  (Preliminary  result)   Collection Time: 09/23/18  5:26 AM  Result Value Ref Range Status   Specimen Description EXPECTORATED SPUTUM  Final   Special Requests NONE Reflexed from [id number]  Final   Gram Stain   Final    FEW WBC PRESENT,BOTH PMN AND MONONUCLEAR MODERATE GRAM POSITIVE COCCI IN CLUSTERS RARE GRAM NEGATIVE RODS RARE GRAM POSITIVE RODS Performed at [facility], [place] [address], [place], [place] [address]    Culture PENDING  Incomplete   Report Status PENDING  Incomplete         Radiology Studies: Dg Chest 2 View  Result Date: 09/22/2018 CLINICAL DATA:  Shortness of breath. EXAM: CHEST - 2 VIEW COMPARISON:  09/18/2018. FINDINGS: Prominent right mid/upper lung field and right base infiltrates again noted. Slight progression from prior exam. Cavitation in the right mid/upper infiltrate cannot be excluded. Low lung volumes. COPD. Right pleural effusion noted on today's exam. Fluid in the right major fissure most likely present. Heart size normal. Degenerative changes scoliosis thoracic spine. IMPRESSION: 1. Prominent right mid/upper lung field and right base infiltrates again noted. Slight progression from prior exam. Cavitation within the infiltrate in the right mid/upper lung cannot be excluded. Low lung volumes. COPD. 2. Right pleural effusion noted on today's exam. Fluid in the right major fissure most likely present. Electronically Signed   By: Marcello Moores  Register   On: 09/22/2018 09:20   Ct Chest Wo Contrast  Result Date: 09/22/2018 CLINICAL DATA:  Abscess of lung or mediastinum, abnormal chest radiograph EXAM: CT CHEST WITHOUT CONTRAST TECHNIQUE: Multidetector CT imaging of the chest was performed following the standard protocol without IV contrast. COMPARISON:  Chest radiograph, 09/22/2018, CT chest, 09/12/2018 FINDINGS: Cardiovascular: Aortic atherosclerosis. Normal heart size. Three-vessel coronary artery calcifications. No pericardial effusion. Mediastinum/Nodes: No  enlarged mediastinal, hilar, or axillary lymph nodes. Multiple thyroid nodules. Trachea, and esophagus demonstrate no significant findings. Lungs/Pleura: Severe emphysema. There is a large, cavitary lung lesion centered in the superior segment right lower lobe with an internal air and fluid level, measuring approximately 8.4 x 6.6 x 6.6 cm (series 4, image 67, series 6, image 37). There is extensive heterogeneous airspace disease of the right lower and middle lobes as well as a small right pleural effusion. Cavitation is new in comparison to prior examination dated 09/12/2018 and adjacent airspace disease is generally worsened and more consolidative. Upper Abdomen: No acute abnormality. Musculoskeletal: No chest wall mass or suspicious bone lesions identified. IMPRESSION: 1. There is a large, cavitary lung lesion centered in the superior segment right lower lobe with an internal air and fluid level, measuring approximately 8.4 x 6.6 x 6.6 cm (series 4, image 67, series 6, image 37). There is extensive heterogeneous airspace disease of the right lower and middle lobes as well as a small right pleural effusion. Cavitation is new in comparison to prior examination dated 09/12/2018 and adjacent airspace disease is generally worsened and more consolidative. Findings are most consistent with worsening, cavitating pneumonia. 2.  Emphysema. 3.  Aortic atherosclerosis and coronary artery disease. Electronically Signed   By: Eddie Candle M.D.   On: 09/22/2018 16:49        Scheduled Meds:  bromocriptine  2.5 mg Oral BID   budesonide (PULMICORT) nebulizer solution  0.5 mg Nebulization BID   calcium-vitamin D  1 tablet Oral BID   clindamycin  300 mg Oral Q8H   enoxaparin (LOVENOX) injection  40 mg Subcutaneous Q24H   feeding supplement (ENSURE ENLIVE)  237 mL Oral BID  BM   folic acid  2 mg Oral Daily   hydrocortisone  20 mg Oral Q breakfast   And   hydrocortisone  10 mg Oral QAC supper    ipratropium-albuterol  3 mL Nebulization Q6H   levothyroxine  137 mcg Oral Q0600   methotrexate  10 mg Oral Once per day on Sun Sat   nystatin  5 mL Oral QID   pantoprazole  40 mg Oral BID   potassium chloride  40 mEq Oral Q4H   pravastatin  80 mg Oral Daily   Continuous Infusions:    LOS: 5 days        Aline August, MD [facility] 09/23/2018, 7:59 AM

## 2018-09-23 NOTE — Progress Notes (Signed)
Physical Therapy Treatment Patient Details Name: Ryan Coffey MRN: [id number] DOB: 19-Oct-1932 Today's Date: 09/23/2018    History of Present Illness 85 year old male with history of renal cell carcinoma in remission, COPD, pediatric tumor, hyperlipidemia, rheumatoid arthritis on methotrexate, pneumonia in March 2020 who presented to the ED 09/16/18  for treatment of weakness and was found to have right lower lobe infiltrate but patient was discharged home on oral Levaquin as per patient's request. Admitted 09/18/18 for hypoxia and leukocytosis secondary to PNA.     PT Comments    Pt wanting to hurry home so he can get back to his arm and leg exercises.  Today focused on trunk and core positioning to help with strenghtening.  Focused on education with tachycardia (HR between 98-122bpm) and breathing strategies (O2 sats between 86-92% on 4L).  Pt practiced sit to stands - improved trunk control/more legs.  Pt encouraged to continue breathing exercises.  Will continue to work with pt while on acute and agree on pt needing HH at DC -h e says he does well at home.  He seems motivated   Follow Up Recommendations  Home health PT;Supervision - Intermittent     Equipment Recommendations  Rolling walker with 5" wheels    Recommendations for Other Services       Precautions / Restrictions Precautions Precautions: Fall Restrictions Weight Bearing Restrictions: No Other Position/Activity Restrictions: pt still does not having his ortho shoes that fit his RA feet - unable to do longer walk    Mobility  Bed Mobility Overal bed mobility: Needs Assistance Bed Mobility: Supine to Sit;Sit to Supine     Supine to sit: Supervision;HOB elevated Sit to supine: Supervision   General bed mobility comments: supervision for safety, use of bed rail to pull to EoB  Transfers Overall transfer level: Needs assistance Equipment used: Rolling walker (2 wheeled) Transfers: Sit to/from Stand Sit to Stand:  From elevated surface;Min guard         General transfer comment: pt getting ready to go down for MBS so did sit to stands from higher bed.  Reviewed hand placement.  reviewed importance of keeping low back tall and eyes up when standing and sitting - less strain on back and more work for legs.  talked alot about getting his back stronger - pt has  HEP for arms and legs - but needs to focus on trunk/core intially. he VU.  Pt also educted on tachycardia - HR between 98 and 122 bpm - how to move slowly etc.  Pt also reviewed breathing with O2 - in through nose, out through mouth (not natural for him).  O2 sats between 86-92 % on 4L.  Pt did flutter valve - trying to maintain erect sitting when doing it.  Ambulation/Gait             General Gait Details: pt doesnt have his special shoes - practiced marching in place with hospital socks at EOB with RW until pt needing to sit back on bed.  discussed posture and safe sitting.  discussed breathing strategies.   Stairs             Wheelchair Mobility    Modified Rankin (Stroke Patients Only)       Balance Overall balance assessment: No apparent balance deficits (not formally assessed)  Cognition   Behavior During Therapy: WFL for tasks assessed/performed Overall Cognitive Status: Within Functional Limits for tasks assessed                                 General Comments: Focused a lot of education on body mechanics and tachycardia and breathing techniques with O2      Exercises      General Comments        Pertinent Vitals/Pain Pain Assessment: No/denies pain    Home Living                      Prior Function            PT Goals (current goals can now be found in the care plan section) Progress towards PT goals: Progressing toward goals    Frequency    Min 3X/week      PT Plan Current plan remains appropriate     Co-evaluation              AM-PAC PT "6 Clicks" Mobility   Outcome Measure  Help needed turning from your back to your side while in a flat bed without using bedrails?: None Help needed moving from lying on your back to sitting on the side of a flat bed without using bedrails?: A Little Help needed moving to and from a bed to a chair (including a wheelchair)?: A Little Help needed standing up from a chair using your arms (e.g., wheelchair or bedside chair)?: A Little Help needed to walk in hospital room?: A Little Help needed climbing 3-5 steps with a railing? : A Lot 6 Click Score: 18    End of Session   Activity Tolerance: Patient limited by fatigue Patient left: in bed;with call bell/phone within reach;with bed alarm set Nurse Communication: Mobility status PT Visit Diagnosis: Other abnormalities of gait and mobility (R26.89);Muscle weakness (generalized) (M62.81);Difficulty in walking, not elsewhere classified (R26.2)     Time: 1035-1100 PT Time Calculation (min) (ACUTE ONLY): 25 min  Charges:  $Gait Training: 8-22 mins $Therapeutic Activity: 8-22 mins                     09/23/2018   Rande Lawman, PT    Loyal Buba 09/23/2018, 11:16 AM

## 2018-09-23 NOTE — Progress Notes (Signed)
  Speech Language Pathology Treatment: Dysphagia  Patient Details Name: Ryan Coffey MRN: [id number] DOB: Jan 31, 1933 Today's Date: 09/23/2018 Time: 7893-8101 SLP Time Calculation (min) (ACUTE ONLY): 18 min  Assessment / Plan / Recommendation Clinical Impression  Pt has a baseline, congested cough that makes clinical interpretation more challenging. He does appear to have a consistent cough after drinking thin liquids via straw, which occurs after a few-second delay. Pt and his daughter both deny any known difficulties with swallowing, but she shares that he has had several bouts of PNA so far this year, including hospitalization in March. Given the above, as well as repeat imaging that shows worsened infection, would proceed with MBS. Pt and daughter are in agreement. Will plan for later this afternoon with radiology.    HPI HPI: Ryan Coffey is an 85 y.o. male who presents with weakness with RLL infiltrate on CXR. CT showed an extensive masslike consolidation of the dependent R lung. He was recently treated for thrush. He was admitted in March 2020 with PNA and daughter reports multiple cases of PNA so far this year. PMH inculdes: renal cell carcinoma in remission, COPD,  pituitary tumor, hyperlipidemia, rheumatoid arthritis.      SLP Plan  MBS       Recommendations  Diet recommendations: Regular;Thin liquid Liquids provided via: Cup;Straw Medication Administration: Whole meds with liquid Supervision: Patient able to self feed;Intermittent supervision to cue for compensatory strategies Compensations: Slow rate;Small sips/bites Postural Changes and/or Swallow Maneuvers: Seated upright 90 degrees                Oral Care Recommendations: Oral care BID Follow up Recommendations: (tba) SLP Visit Diagnosis: Dysphagia, unspecified (R13.10) Plan: MBS       GO                Venita Sheffield Vick Filter 09/23/2018, 10:04 AM  Pollyann Glen, M.A. [facility] Acute Environmental education officer  [phone] Office [phone]

## 2018-09-24 ENCOUNTER — Inpatient Hospital Stay (HOSPITAL_COMMUNITY): Payer: Medicare Other

## 2018-09-24 LAB — CBC WITH DIFFERENTIAL/PLATELET
Abs Immature Granulocytes: 0.09 10*3/uL — ABNORMAL HIGH (ref 0.00–0.07)
Basophils Absolute: 0 10*3/uL (ref 0.0–0.1)
Basophils Relative: 0 %
Eosinophils Absolute: 0.1 10*3/uL (ref 0.0–0.5)
Eosinophils Relative: 2 %
HCT: 29.4 % — ABNORMAL LOW (ref 39.0–52.0)
Hemoglobin: 9.3 g/dL — ABNORMAL LOW (ref 13.0–17.0)
Immature Granulocytes: 1 %
Lymphocytes Relative: 8 %
Lymphs Abs: 0.8 10*3/uL (ref 0.7–4.0)
MCH: 30.2 pg (ref 26.0–34.0)
MCHC: 31.6 g/dL (ref 30.0–36.0)
MCV: 95.5 fL (ref 80.0–100.0)
Monocytes Absolute: 0.3 10*3/uL (ref 0.1–1.0)
Monocytes Relative: 3 %
Neutro Abs: 8.2 10*3/uL — ABNORMAL HIGH (ref 1.7–7.7)
Neutrophils Relative %: 86 %
Platelets: 553 10*3/uL — ABNORMAL HIGH (ref 150–400)
RBC: 3.08 MIL/uL — ABNORMAL LOW (ref 4.22–5.81)
RDW: 15.8 % — ABNORMAL HIGH (ref 11.5–15.5)
WBC: 9.5 10*3/uL (ref 4.0–10.5)
nRBC: 0 % (ref 0.0–0.2)

## 2018-09-24 LAB — EXPECTORATED SPUTUM ASSESSMENT W GRAM STAIN, RFLX TO RESP C

## 2018-09-24 LAB — BASIC METABOLIC PANEL
Anion gap: 9 (ref 5–15)
BUN: 15 mg/dL (ref 8–23)
CO2: 37 mmol/L — ABNORMAL HIGH (ref 22–32)
Calcium: 8.2 mg/dL — ABNORMAL LOW (ref 8.9–10.3)
Chloride: 88 mmol/L — ABNORMAL LOW (ref 98–111)
Creatinine, Ser: 0.8 mg/dL (ref 0.61–1.24)
GFR calc Af Amer: >60 mL/min (ref >60)
GFR calc non Af Amer: >60 mL/min (ref >60)
Glucose, Bld: 102 mg/dL — ABNORMAL HIGH (ref 70–99)
Potassium: 3.1 mmol/L — ABNORMAL LOW (ref 3.5–5.1)
Sodium: 134 mmol/L — ABNORMAL LOW (ref 135–145)

## 2018-09-24 LAB — MAGNESIUM: Magnesium: 1.9 mg/dL (ref 1.7–2.4)

## 2018-09-24 MED ORDER — POTASSIUM CHLORIDE CRYS ER 20 MEQ PO TBCR
40.0000 meq | EXTENDED_RELEASE_TABLET | Freq: Once | ORAL | Status: AC
  Administered 2018-09-24: 40 meq via ORAL
  Filled 2018-09-24: qty 2

## 2018-09-24 MED ORDER — POTASSIUM CHLORIDE CRYS ER 20 MEQ PO TBCR
40.0000 meq | EXTENDED_RELEASE_TABLET | ORAL | Status: AC
  Administered 2018-09-24 (×2): 40 meq via ORAL
  Filled 2018-09-24 (×2): qty 2

## 2018-09-24 NOTE — Progress Notes (Signed)
NAME:  Ryan Coffey, MRN:  [id number], DOB:  1932-05-11, LOS: 6 ADMISSION DATE:  09/18/2018, CONSULTATION DATE:  09/22/2018 REFERRING MD:  Remi Haggard, CHIEF COMPLAINT:  Weakness, SOB   Brief History   85 yo seen by Dr. Vaughan Browner, exhibiting worsening hypoxia inpatient and CXR with concern for cavitary lung lesion RML   History of present illness   85 yo M PMH renal cell carcinoma (believed to be in remission), possible COPD (symbicort, no home O2), HLD, RA (on MTX), pituitary tumor, PNA (hospitalized 05/2018)  who presents to hospital 7/16 with complains of SOB and weakness. Patient noted associated white coating on tongue and sore throat  causing poor PO intake, green colored sputum x 3 months. Patient endorsed cough x 5-6 months. Patient denied chest pain, palpitations, PND, orthopnea, nausea, vomiting, diarrhea. He stated in ED that he might be dizzy. He denied falls.   Of note, patient presented to ED 2 days prior for CC weakness and was found to have PNA and renal insufficiency. Admission was recommended at this time however patient elected to dc home after IVF, and was prescribed levaquin for PNA however patient did not fill this prescription.   Patient was seen by Dr. Vaughan Browner, [facility], in June/2020 and a high resolution CT chest revealed extensive, somewhat mass-like consolidation of R lung, centered about superior segment of RLL and involving RML.   Past Medical History  ? COPD PNA Renal cell carcinoma  RA  Significant Hospital Events   7/16> admitted 7/20> PCCM consulted  7/21 > Passed MBS with recs for nectar thick liquids  Consults:  PCCM   Procedures:   Significant Diagnostic Tests:  CT Chest 09/22/2018>> large, cavitary lung lesion centered in the superior segment right lower lobe with an internal air and fluid level, measuring approximately 8.4 x 6.6 x 6.6 cm (series 4, image 67, series 6, image 37). There is extensive heterogeneous airspace disease of the right  lower and middle lobes as well as a small right pleural effusion. Cavitation is new in comparison to prior examination dated 09/12/2018 and adjacent airspace disease is generally worsened and more consolidative. Findings are most consistent with worsening, cavitating pneumonia. CT chest 7/10 > extensive mass-like consolidation of dependent R lung, centered about superior segment RLL and involving RML. Severe emphysema.  CXR 7/20> RML RLL infiltrates with slight progression from prior. Possible cavitation within RML infiltrate   Micro Data:  7/16 SARS CoV2 > neg  7/17 MRSA> neg  7/17 UCx > no growth 7/17 BCx No growth  7/21 sputum >   Antimicrobials:  Vanc 7/16>> 7/20 Cefepime 7/16 >> 7/20 Clinda 7/20 >   Interim history/subjective:  Stable on 5L HFNC.  Anxious to get home.  Objective   Blood pressure 115/61, pulse 80, temperature 98.1 F (36.7 C), temperature source Oral, resp. rate 20, height 5\' 8"  (1.727 m), weight 67.8 kg, SpO2 94 %.        Intake/Output Summary (Last 24 hours) at 09/24/2018 1024 Last data filed at 09/24/2018 0500 Gross per 24 hour  Intake 720 ml  Output 2650 ml  Net -1930 ml   Filed Weights   09/21/18 0524 09/23/18 0617 09/24/18 0615  Weight: 73 kg 69.3 kg 67.8 kg    Examination: General: Adult male, resting in bed, in NAD. Neuro: A&O x 3, no deficits. HEENT: [place]/AT. Sclerae anicteric. MMM. Cardiovascular: RRR, no M/R/G.  Lungs: Respirations even and unlabored.  CTA bilaterally, No W/R/R. Abdomen: BS x 4, soft, NT/ND.  Musculoskeletal: Chronic RA changes to bilateral hands L > R.  No edema.  Skin: Intact, warm, no rashes.  Assessment / Plan:   HCAP with hypoxic respiratory failure: multilobar right sided pneumonia. Location somewhat concerning for aspiration although SLP evaluation / MBS does not seem to endorse this. There is some cavitary component of this with air fluid level developing. Cultures negative so far. He is immunocompromised due to  methotrexate for 25 yrs for RA.  - Continue supplemental O2 to keep SpO2 > 92% (cuurently on 5L  [place]). - Will need home O2. - Continue empiric clindamycin and follow cultures through completion (would continue clinda x 6 weeks then assess follow up CT chest as outpatient). - Aggressive Pulmonary Toilet as able ( IS, Flutter, OOB to chair). - PT/ OT when able.  COPD concern for COPD based on emphysematous changes on imaging.  Has been seen by Dr. Vaughan Browner and has PFTs pending.   Of note, he has been unable to tolerate inhalers due to mouth/throat pain. - Continue trelegy, duonebs. - Outpatient f/u with pulmonology arranged for 10/07/18 at 9:30AM.  Suspect ILD r/t RA. - Supplemental O2 as need to keep SpO2 > 92% - Outpatient f/u arranged as above.  Rest per primary team.  Nothing further to add.  PCCM will sign off.  Please do not hesitate to call us back if we can be of any further assistance.   Best practice:  Diet: Per primary/SLP Pain/Anxiety/Delirium protocol (if indicated): NA VAP protocol (if indicated): NA DVT prophylaxis: Enoxaparin GI prophylaxis: Pantoprazole  Glucose control: NA Mobility: Per primary Code Status: DNR Family Communication: patient updated Disposition: medical floor   Montey Hora, [facility] Pager: [phone] - [phone].  If no answer, [phone] 09/24/2018, 10:44 AM

## 2018-09-24 NOTE — Progress Notes (Signed)
  Speech Language Pathology Treatment: Dysphagia  Patient Details Name: Ryan Coffey MRN: [id number] DOB: May 03, 1932 Today's Date: 09/24/2018 Time: 3428-7681 SLP Time Calculation (min) (ACUTE ONLY): 25 min  Assessment / Plan / Recommendation Clinical Impression  Patient seen to address dysphagia goals with focus of session on patient education and EMST training. Patient pleasantly declined PO's and is frustrated with his current state as he said that prior to needing this hospitalization he could do anything but now feels weak and is anxious to get home so he can work on getting his legs and lungs stronger.  He is frustrated also with the nectar thick liquids as he did not feel that the MBS yesterday showed much aspiration, and, as he had already appetite decline prior to hospitalization, he feels the nectar thick liquids limit his diet even more. He stated that he would drink Ensure shakes with ice cream at home to supplement his diet. SLP educated patient on results of MBS, swallow safety precautions, and advised him to utilize swallow strategies that he had been taught yesterday, as SLP does not anticipate he will continue with nectar thick liquids at home. SLP also educated, trained and guided patient through trial of EMST, which he was able to return-demonstrate and was receptive to continuing with this at home.    HPI HPI: MICHAELANGELO MITTELMAN is an 85 y.o. male who presents with weakness with RLL infiltrate on CXR. CT showed an extensive masslike consolidation of the dependent R lung. He was recently treated for thrush. He was admitted in March 2020 with PNA and daughter reports multiple cases of PNA so far this year. PMH inculdes: renal cell carcinoma in remission, COPD,  pituitary tumor,  hyperlipidemia, rheumatoid arthritis.      SLP Plan  Continue with current plan of care       Recommendations    none                Oral Care Recommendations: Oral care BID Follow up  Recommendations: None(patient will likely not comply with [facility] SLP therapy) SLP Visit Diagnosis: Dysphagia, pharyngeal phase (R13.13) Plan: Continue with current plan of care       GO                Dannial Monarch 09/24/2018, 3:52 PM   Sonia Baller, MA, [facility] Acute Rehab Pager: [phone]

## 2018-09-24 NOTE — Progress Notes (Signed)
Patient Demographics:    Ryan Coffey, is a 85 y.o. male, DOB - 03-15-1932, WCB:[id number]  Admit date - 09/18/2018   Admitting Physician Vashti Hey, MD  Outpatient Primary MD for the patient is Avva, Steva Ready, MD  LOS - 6   Chief Complaint  Patient presents with   Shortness of Breath        Subjective:    Ryan Coffey today has no fevers, no emesis,  No chest pain,  significant dyspnea on minimal exertion --- Continues to require 4 to 5 L of oxygen via nasal cannula --Easily fatigues, difficulty transferring from bed to chair  Assessment  & Plan :    Principal Problem:   HAP (hospital-acquired pneumonia) Active Problems:   Seropositive rheumatoid arthritis ([place])   Panhypopituitarism ([place])   History of renal cell carcinoma   COPD (chronic obstructive pulmonary disease) ([place])   Immunocompromised state ([place])   Pressure injury of skin   Brief Narrative:  85 year old male with history of renal cell carcinoma in remission, COPD, pediatric tumor, hyperlipidemia, rheumatoid arthritis on methotrexate, pneumonia in March 2020 who presented to the ED 2 days ago for treatment of weakness and was found to have right lower lobe infiltrate but patient was discharged home on oral Levaquin as per patient's request.  Patient presented again on 09/18/2018 with worsening weakness, cough, very poor appetite and difficulty swallowing.  He had a recent high-resolution CT chest done on 09/12/2018 as an outpatient as per Dr. Matilde Bash request which showed extensive somewhat masslike consolidation of the dependent right lung, centered about the superior segment of the right lower lobe although involving the right middle lobe as well.  Findings likely reflected infection or aspiration, although underlying mass is not excluded.  He was found to be febrile in the ED along with hypoxia and leukocytosis.  Patient  was started on broad-spectrum antibiotics.  Pulmonary was consulted on 09/22/2018 as respiratory status worsened and patient had a temperature spike.   A/p 1)Acute Hypoxic Respiratory Failure secondary to Cavitary HCAP--patient with multi-lobar right-sided pneumonia, cannot exclude aspiration component.  Speech eval/MBS eval noted -Cultures remain negative, per pulmonologist treat empirically with 6 weeks of oral clindamycin followed by repeat CT chest --Risk of C. difficile infection discussed with patient and daughter Colletta [place] , continue flutter valve/pulmonary toilet/incentive spirometry --- Continue to wean down oxygen currently down to 4 L/min via nasal cannula   2)Possible ILD secondary to Rheumatoid Arthritis--- patient will follow up with Dr. Vaughan Browner as outpatient post discharge,   3) immunosuppressed state--- patient has been on methotrexate for about 25 years now for rheumatoid arthritis---  4)COPD/Emphysema--- patient will follow-up at this time with Dr. Vaughan Browner on 10/07/2018 at 9:30 AM, will probably need PFTs repeated --- Continue bronchodilators and mucolytics  5)Panhypopituitarism secondary to pituitary adenoma -Stress doses of steroids have been switched to home hydrocortisone regimen -Continue bromocriptine  6)Generalized Weakness/Deconditioning--- physical therapy evaluation appreciated, patient's daughter is will be available to help him when he gets home starting 09/25/2018, will also need home O2 and home health PT  7)Hypothyroidism--- stable, continue Synthroid  8)Stage I pressure injury on the coccyx: Present on admission -Follow local wound care  9)HFpEF--- patient with chronic diastolic dysfunction CHF, EF 60 to 65%, overall stable,  no evidence of overt CHF exacerbation at this time  Disposition/Need for in-Hospital Stay- patient unable to be discharged at this time due to persistent respiratory symptoms, persistent hypoxia---Possible discharge home on 09/25/2018 with  home O2 and home physical therapy  Code Status : DNR  Family Communication:     (patient is alert, awake and coherent) Discussed with daughter Ms Mal Amabile on 09/24/18  Disposition Plan  : Possible discharge home on 09/25/2018 with home O2 and home physical therapy  -  Antimicrobials:  Cefepime and Zithromax from 09/18/2018-09/22/2018 Vancomycin from 09/18/2018-09/20/2018.  Received 1 dose of vancomycin on 09/21/2016 Clindamycin from 09/22/2018 onwards  Consults  :  PCCM/Pulm consult  DVT Prophylaxis  :  Lovenox   SCDs   Lab Results  Component Value Date   PLT 553 (H) 09/24/2018   Inpatient Medications  Scheduled Meds:  bromocriptine  2.5 mg Oral BID   budesonide (PULMICORT) nebulizer solution  0.5 mg Nebulization BID   calcium-vitamin D  1 tablet Oral BID   clindamycin  300 mg Oral Q8H   enoxaparin (LOVENOX) injection  40 mg Subcutaneous Q24H   feeding supplement (ENSURE ENLIVE)  237 mL Oral BID BM   folic acid  2 mg Oral Daily   hydrocortisone  20 mg Oral Q breakfast   And   hydrocortisone  10 mg Oral QAC supper   ipratropium-albuterol  3 mL Nebulization Q6H   levothyroxine  137 mcg Oral Q0600   methotrexate  10 mg Oral Once per day on Sun Sat   nystatin  5 mL Oral QID   pantoprazole  40 mg Oral BID   pravastatin  80 mg Oral Daily   Continuous Infusions: PRN Meds:.acetaminophen, guaiFENesin-dextromethorphan, levalbuterol, Resource ThickenUp Clear, zolpidem   Anti-infectives (From admission, onward)   Start     Dose/Rate Route Frequency Ordered Stop   09/23/18 0900  vancomycin (VANCOCIN) 1,500 mg in sodium chloride 0.9 % 500 mL IVPB  Status:  Discontinued     1,500 mg 250 mL/hr over 120 Minutes Intravenous Every 24 hours 09/22/18 0846 09/22/18 1135   09/22/18 1400  clindamycin (CLEOCIN) capsule 300 mg     300 mg Oral Every 8 hours 09/22/18 1135     09/22/18 0845  vancomycin (VANCOCIN) 1,500 mg in sodium chloride 0.9 % 500 mL IVPB  Status:   Discontinued     1,500 mg 250 mL/hr over 120 Minutes Intravenous  Once 09/22/18 0832 09/22/18 1135   09/19/18 1400  vancomycin (VANCOCIN) 1,250 mg in sodium chloride 0.9 % 250 mL IVPB  Status:  Discontinued     1,250 mg 166.7 mL/hr over 90 Minutes Intravenous Every 24 hours 09/18/18 1537 09/20/18 0825   09/19/18 0100  ceFEPIme (MAXIPIME) 2 g in sodium chloride 0.9 % 100 mL IVPB  Status:  Discontinued     2 g 200 mL/hr over 30 Minutes Intravenous Every 12 hours 09/18/18 1537 09/22/18 1135   09/18/18 1600  azithromycin (ZITHROMAX) 500 mg in sodium chloride 0.9 % 250 mL IVPB  Status:  Discontinued     500 mg 250 mL/hr over 60 Minutes Intravenous Every 24 hours 09/18/18 1546 09/22/18 0821   09/18/18 1300  vancomycin (VANCOCIN) 1,250 mg in sodium chloride 0.9 % 250 mL IVPB     1,250 mg 166.7 mL/hr over 90 Minutes Intravenous  Once 09/18/18 1233 09/18/18 1542   09/18/18 1230  vancomycin (VANCOCIN) IVPB 1000 mg/200 mL premix  Status:  Discontinued     1,000 mg 200 mL/hr  over 60 Minutes Intravenous  Once 09/18/18 1217 09/18/18 1233   09/18/18 1230  ceFEPIme (MAXIPIME) 2 g in sodium chloride 0.9 % 100 mL IVPB     2 g 200 mL/hr over 30 Minutes Intravenous  Once 09/18/18 1217 09/18/18 1353        Objective:   Vitals:   09/24/18 0228 09/24/18 0300 09/24/18 0609 09/24/18 0615  BP:   115/61   Pulse:   80   Resp:      Temp:   98.1 F (36.7 C)   TempSrc:   Oral   SpO2: 100% 99% 94%   Weight:    67.8 kg  Height:        Wt Readings from Last 3 Encounters:  09/24/18 67.8 kg  09/16/18 72.6 kg  08/29/18 69.3 kg     Intake/Output Summary (Last 24 hours) at 09/24/2018 1236 Last data filed at 09/24/2018 0500 Gross per 24 hour  Intake 480 ml  Output 2425 ml  Net -1945 ml   Physical Exam Gen:- Awake Alert, significant dyspnea on minimal exertion HEENT:- [place].AT, No sclera icterus Nose- [place] 4 L/min Neck-Supple Neck,No JVD,.  Lungs-diminished bilaterally with scattered rhonchi CV- S1, S2  normal, regular  Abd-  +ve B.Sounds, Abd Soft, No tenderness,    Extremity/Skin:- No  edema, pedal pulses present  Psych-affect is appropriate, oriented x3 Neuro-generalized weakness, but no new focal deficits, no tremors   Data Review:   Micro Results Recent Results (from the past 240 hour(s))  SARS Coronavirus 2 (CEPHEID - Performed in [place] hospital lab), Hosp Order     Status: None   Collection Time: 09/16/18 10:03 PM   Specimen: Nasopharyngeal Swab  Result Value Ref Range Status   SARS Coronavirus 2 NEGATIVE NEGATIVE Final    Comment: (NOTE) If result is NEGATIVE SARS-CoV-2 target nucleic acids are NOT DETECTED. The SARS-CoV-2 RNA is generally detectable in upper and lower  respiratory specimens during the acute phase of infection. The lowest  concentration of SARS-CoV-2 viral copies this assay can detect is 250  copies / mL. A negative result does not preclude SARS-CoV-2 infection  and should not be used as the sole basis for treatment or other  patient management decisions.  A negative result may occur with  improper specimen collection / handling, submission of specimen other  than nasopharyngeal swab, presence of viral mutation(s) within the  areas targeted by this assay, and inadequate number of viral copies  (<250 copies / mL). A negative result must be combined with clinical  observations, patient history, and epidemiological information. If result is POSITIVE SARS-CoV-2 target nucleic acids are DETECTED. The SARS-CoV-2 RNA is generally detectable in upper and lower  respiratory specimens dur ing the acute phase of infection.  Positive  results are indicative of active infection with SARS-CoV-2.  Clinical  correlation with patient history and other diagnostic information is  necessary to determine patient infection status.  Positive results do  not rule out bacterial infection or co-infection with other viruses. If result is PRESUMPTIVE POSTIVE SARS-CoV-2  nucleic acids MAY BE PRESENT.   A presumptive positive result was obtained on the submitted specimen  and confirmed on repeat testing.  While 2019 novel coronavirus  (SARS-CoV-2) nucleic acids may be present in the submitted sample  additional confirmatory testing may be necessary for epidemiological  and / or clinical management purposes  to differentiate between  SARS-CoV-2 and other Sarbecovirus currently known to infect humans.  If clinically indicated additional testing with  an alternate test  methodology [phone]) is advised. The SARS-CoV-2 RNA is generally  detectable in upper and lower respiratory sp ecimens during the acute  phase of infection. The expected result is Negative. Fact Sheet for Patients:  StrictlyIdeas.no Fact Sheet for Healthcare Providers: BankingDealers.co.za This test is not yet approved or cleared by the [place] FDA and has been authorized for detection and/or diagnosis of SARS-CoV-2 by FDA under an Emergency Use Authorization (EUA).  This EUA will remain in effect (meaning this test can be used) for the duration of the COVID-19 declaration under Section 564(b)(1) of the Act, 21 U.S.C. section 360bbb-3(b)(1), unless the authorization is terminated or revoked sooner. Performed at [facility], [place] [address], [place], [place] [address]   Blood Culture (routine x 2)     Status: None   Collection Time: 09/18/18 12:35 PM   Specimen: BLOOD RIGHT FOREARM  Result Value Ref Range Status   Specimen Description BLOOD RIGHT FOREARM  Final   Special Requests   Final    BOTTLES DRAWN AEROBIC AND ANAEROBIC Blood Culture adequate volume   Culture   Final    NO GROWTH 5 DAYS Performed at [facility], [place] [address] [place], [place] [address]    Report Status 09/23/2018 FINAL  Final  Blood Culture (routine x 2)     Status: None   Collection Time: 09/18/18 12:49 PM   Specimen: BLOOD LEFT FOREARM    Result Value Ref Range Status   Specimen Description BLOOD LEFT FOREARM  Final   Special Requests   Final    BOTTLES DRAWN AEROBIC AND ANAEROBIC Blood Culture adequate volume   Culture   Final    NO GROWTH 5 DAYS Performed at [facility], [place] [address], [place], [place] [address]    Report Status 09/23/2018 FINAL  Final  SARS Coronavirus 2 (CEPHEID - Performed in [place] hospital lab), Hosp Order     Status: None   Collection Time: 09/18/18  2:47 PM   Specimen: Nasopharyngeal Swab  Result Value Ref Range Status   SARS Coronavirus 2 NEGATIVE NEGATIVE Final    Comment: (NOTE) If result is NEGATIVE SARS-CoV-2 target nucleic acids are NOT DETECTED. The SARS-CoV-2 RNA is generally detectable in upper and lower  respiratory specimens during the acute phase of infection. The lowest  concentration of SARS-CoV-2 viral copies this assay can detect is 250  copies / mL. A negative result does not preclude SARS-CoV-2 infection  and should not be used as the sole basis for treatment or other  patient management decisions.  A negative result may occur with  improper specimen collection / handling, submission of specimen other  than nasopharyngeal swab, presence of viral mutation(s) within the  areas targeted by this assay, and inadequate number of viral copies  (<250 copies / mL). A negative result must be combined with clinical  observations, patient history, and epidemiological information. If result is POSITIVE SARS-CoV-2 target nucleic acids are DETECTED. The SARS-CoV-2 RNA is generally detectable in upper and lower  respiratory specimens dur ing the acute phase of infection.  Positive  results are indicative of active infection with SARS-CoV-2.  Clinical  correlation with patient history and other diagnostic information is  necessary to determine patient infection status.  Positive results do  not rule out bacterial infection or co-infection with other viruses. If result is  PRESUMPTIVE POSTIVE SARS-CoV-2 nucleic acids MAY BE PRESENT.   A presumptive positive result was obtained on  the submitted specimen  and confirmed on repeat testing.  While 2019 novel coronavirus  (SARS-CoV-2) nucleic acids may be present in the submitted sample  additional confirmatory testing may be necessary for epidemiological  and / or clinical management purposes  to differentiate between  SARS-CoV-2 and other Sarbecovirus currently known to infect humans.  If clinically indicated additional testing with an alternate test  methodology [phone]) is advised. The SARS-CoV-2 RNA is generally  detectable in upper and lower respiratory sp ecimens during the acute  phase of infection. The expected result is Negative. Fact Sheet for Patients:  StrictlyIdeas.no Fact Sheet for Healthcare Providers: BankingDealers.co.za This test is not yet approved or cleared by the [place] FDA and has been authorized for detection and/or diagnosis of SARS-CoV-2 by FDA under an Emergency Use Authorization (EUA).  This EUA will remain in effect (meaning this test can be used) for the duration of the COVID-19 declaration under Section 564(b)(1) of the Act, 21 U.S.C. section 360bbb-3(b)(1), unless the authorization is terminated or revoked sooner. Performed at [facility], [place] [address], [place], [place] [address]   MRSA PCR Screening     Status: None   Collection Time: 09/19/18  1:00 PM   Specimen: Nasal Mucosa; Nasopharyngeal  Result Value Ref Range Status   MRSA by PCR NEGATIVE NEGATIVE Final    Comment:        The GeneXpert MRSA Assay (FDA approved for NASAL specimens only), is one component of a comprehensive MRSA colonization surveillance program. It is not intended to diagnose MRSA infection nor to guide or monitor treatment for MRSA infections. Performed at [facility], [place] [address] [place], [place] [address]   Urine  Culture     Status: None   Collection Time: 09/19/18  8:44 PM   Specimen: In/Out Cath Urine  Result Value Ref Range Status   Specimen Description IN/OUT CATH URINE  Final   Special Requests NONE  Final   Culture   Final    NO GROWTH Performed at [facility], [place] [address] [place], [place] [address]    Report Status 09/21/2018 FINAL  Final  Culture, blood (routine x 2)     Status: None (Preliminary result)   Collection Time: 09/22/18  8:48 AM   Specimen: BLOOD RIGHT HAND  Result Value Ref Range Status   Specimen Description BLOOD RIGHT HAND  Final   Special Requests   Final    BOTTLES DRAWN AEROBIC ONLY Blood Culture results may not be optimal due to an inadequate volume of blood received in culture bottles   Culture   Final    NO GROWTH 2 DAYS Performed at [facility], [place] [address], [place], [place] [address]    Report Status PENDING  Incomplete  Culture, blood (routine x 2)     Status: None (Preliminary result)   Collection Time: 09/22/18  8:48 AM   Specimen: BLOOD RIGHT HAND  Result Value Ref Range Status   Specimen Description BLOOD RIGHT HAND  Final   Special Requests   Final    BOTTLES DRAWN AEROBIC ONLY Blood Culture adequate volume   Culture   Final    NO GROWTH 2 DAYS Performed at [facility], [place] [address] [place], [place] [address]    Report Status PENDING  Incomplete  Expectorated sputum assessment w rflx to resp cult     Status: None   Collection Time: 09/23/18  5:26 AM   Specimen:  Expectorated Sputum  Result Value Ref Range Status   Specimen Description EXPECTORATED SPUTUM  Final   Special Requests NONE  Final   Sputum evaluation   Final    THIS SPECIMEN IS ACCEPTABLE FOR SPUTUM CULTURE Performed at [facility], [address], [place], [place] [address]    Report Status 09/24/2018 FINAL  Final  Culture, respiratory     Status: None (Preliminary result)   Collection Time: 09/23/18  5:26 AM  Result Value Ref Range Status    Specimen Description EXPECTORATED SPUTUM  Final   Special Requests NONE Reflexed from [id number]  Final   Gram Stain   Final    FEW WBC PRESENT,BOTH PMN AND MONONUCLEAR MODERATE GRAM POSITIVE COCCI IN CLUSTERS RARE GRAM NEGATIVE RODS RARE GRAM POSITIVE RODS    Culture   Final    CULTURE REINCUBATED FOR BETTER GROWTH Performed at [facility], [place] [address] [place], [place] [address]    Report Status PENDING  Incomplete    Radiology Reports Dg Chest 2 View  Result Date: 09/22/2018 CLINICAL DATA:  Shortness of breath. EXAM: CHEST - 2 VIEW COMPARISON:  09/18/2018. FINDINGS: Prominent right mid/upper lung field and right base infiltrates again noted. Slight progression from prior exam. Cavitation in the right mid/upper infiltrate cannot be excluded. Low lung volumes. COPD. Right pleural effusion noted on today's exam. Fluid in the right major fissure most likely present. Heart size normal. Degenerative changes scoliosis thoracic spine. IMPRESSION: 1. Prominent right mid/upper lung field and right base infiltrates again noted. Slight progression from prior exam. Cavitation within the infiltrate in the right mid/upper lung cannot be excluded. Low lung volumes. COPD. 2. Right pleural effusion noted on today's exam. Fluid in the right major fissure most likely present. Electronically Signed   By: Marcello Moores  Register   On: 09/22/2018 09:20   Ct Chest Wo Contrast  Result Date: 09/22/2018 CLINICAL DATA:  Abscess of lung or mediastinum, abnormal chest radiograph EXAM: CT CHEST WITHOUT CONTRAST TECHNIQUE: Multidetector CT imaging of the chest was performed following the standard protocol without IV contrast. COMPARISON:  Chest radiograph, 09/22/2018, CT chest, 09/12/2018 FINDINGS: Cardiovascular: Aortic atherosclerosis. Normal heart size. Three-vessel coronary artery calcifications. No pericardial effusion. Mediastinum/Nodes: No enlarged mediastinal, hilar, or axillary lymph nodes. Multiple thyroid  nodules. Trachea, and esophagus demonstrate no significant findings. Lungs/Pleura: Severe emphysema. There is a large, cavitary lung lesion centered in the superior segment right lower lobe with an internal air and fluid level, measuring approximately 8.4 x 6.6 x 6.6 cm (series 4, image 67, series 6, image 37). There is extensive heterogeneous airspace disease of the right lower and middle lobes as well as a small right pleural effusion. Cavitation is new in comparison to prior examination dated 09/12/2018 and adjacent airspace disease is generally worsened and more consolidative. Upper Abdomen: No acute abnormality. Musculoskeletal: No chest wall mass or suspicious bone lesions identified. IMPRESSION: 1. There is a large, cavitary lung lesion centered in the superior segment right lower lobe with an internal air and fluid level, measuring approximately 8.4 x 6.6 x 6.6 cm (series 4, image 67, series 6, image 37). There is extensive heterogeneous airspace disease of the right lower and middle lobes as well as a small right pleural effusion. Cavitation is new in comparison to prior examination dated 09/12/2018 and adjacent airspace disease is generally worsened and more consolidative. Findings are most consistent with worsening, cavitating pneumonia. 2.  Emphysema. 3.  Aortic atherosclerosis and coronary artery disease. Electronically Signed  By: Eddie Candle M.D.   On: 09/22/2018 16:49   Ct Chest High Resolution  Result Date: 09/12/2018 CLINICAL DATA:  COPD exacerbation, productive cough EXAM: CT CHEST WITHOUT CONTRAST TECHNIQUE: Multidetector CT imaging of the chest was performed following the standard protocol without intravenous contrast. High resolution imaging of the lungs, as well as inspiratory and expiratory imaging, was performed. COMPARISON:  05/15/2018 FINDINGS: Cardiovascular: Aortic atherosclerosis. Extensive 3 vessel coronary artery calcifications. Normal heart size. No pericardial effusion.  Mediastinum/Nodes: No enlarged mediastinal, hilar, or axillary lymph nodes. Thyroid gland, trachea, and esophagus demonstrate no significant findings. Lungs/Pleura: Severe emphysema. There is extensive, somewhat masslike consolidation of the dependent right lung, centered about the superior segment right lower lobe although also involving the middle lobe (series 3, image 55). No pleural effusion or pneumothorax. Upper Abdomen: No acute abnormality. Musculoskeletal: No chest wall mass or suspicious bone lesions identified. IMPRESSION: 1. There is extensive, somewhat masslike consolidation of the dependent right lung, centered about the superior segment right lower lobe although also involving the middle lobe (series 3, image 55). Findings likely reflect infection or aspiration, although underlying mass is not excluded. Recommend follow-up CT in 6-8 weeks to observe for resolution. 2.  Severe emphysema. 3.  Coronary artery disease and aortic atherosclerosis. Electronically Signed   By: Eddie Candle M.D.   On: 09/12/2018 15:46   Dg Chest Port 1 View  Result Date: 09/24/2018 CLINICAL DATA:  Respiratory failure.  COPD. EXAM: PORTABLE CHEST 1 VIEW COMPARISON:  September 22, 2018 FINDINGS: The cavitated infiltrate in the right mid and lateral lower lung is stable. No pneumothorax. Minimal atelectasis in the left base. The cardiomediastinal silhouette is stable. No other interval changes. IMPRESSION: The cavitated infiltrate in the right mid and lateral lower lung is stable. Minimal atelectasis in the left base. No other change. Electronically Signed   By: Dorise Bullion III M.D   On: 09/24/2018 08:53   Dg Chest Port 1 View  Result Date: 09/18/2018 CLINICAL DATA:  Shortness of breath, fever and pneumonia. EXAM: PORTABLE CHEST 1 VIEW COMPARISON:  September 16, 2018 FINDINGS: The cardiac silhouette is stably enlarged. Calcific atherosclerotic disease of the aorta. Upper lobe predominant emphysema. No significant change in  patchy airspace consolidation in the right mid and lower lung. Minimal peribronchial airspace consolidation the left lower lung. Osseous structures are without acute abnormality. Soft tissues are grossly normal. IMPRESSION: 1. No significant change in the patchy airspace consolidation in the right mid and lower lung. 2. Minimal peribronchial airspace consolidation in the left lower lung. Electronically Signed   By: Fidela Salisbury M.D.   On: 09/18/2018 12:48   Dg Chest Port 1 View  Result Date: 09/16/2018 CLINICAL DATA:  Shortness of breath EXAM: PORTABLE CHEST 1 VIEW COMPARISON:  September 12, 2018.  Chest x-ray 05/16/2018 FINDINGS: Consolidation in the right mid and lower lung compatible with pneumonia. Left lung clear. Heart is normal size. No effusions or acute bony abnormality. IMPRESSION: Dense consolidation in the right mid and lower lung as seen on prior CT compatible with pneumonia. Electronically Signed   By: Rolm Baptise M.D.   On: 09/16/2018 22:14   Dg Swallowing Func-speech Pathology  Result Date: 09/23/2018 Objective Swallowing Evaluation: Type of Study: MBS-Modified Barium Swallow Study  Patient Details Name: OWENS HARA MRN: [id number] Date of Birth: 1932/09/15 Today's Date: 09/23/2018 Time: SLP Start Time (ACUTE ONLY): 1411 -SLP Stop Time (ACUTE ONLY): 1434 SLP Time Calculation (min) (ACUTE ONLY): 23 min Past Medical History: Past  Medical History: Diagnosis Date  Arthritis   Benign tumor of pituitary gland ([place])   "wasn't able to get it all"  COPD (chronic obstructive pulmonary disease) ([place])   Pneumonia 06/27/11  "first time"  Shingles  Past Surgical History: Past Surgical History: Procedure Laterality Date  CHOLECYSTECTOMY OPEN  2010  KIDNEY SURGERY  2011  "lesion removed right side"  RENAL BIOPSY  2012  right  TRANSPHENOIDAL / TRANSNASAL HYPOPHYSECTOMY / RESECTION PITUITARY TUMOR  ~ 1979  "they couldn't get it all" HPI: TALLIE HEVIA is an 85 y.o. male who presents with weakness  with RLL infiltrate on CXR. CT showed an extensive masslike consolidation of the dependent R lung. He was recently treated for thrush. He was admitted in March 2020 with PNA and daughter reports multiple cases of PNA so far this year. PMH inculdes: renal cell carcinoma in remission, COPD,  pituitary tumor,  hyperlipidemia, rheumatoid arthritis.  Subjective: pt denies any trouble swallowing Assessment / Plan / Recommendation CHL IP CLINICAL IMPRESSIONS 09/23/2018 Clinical Impression Pt presents with a mild pharyngeal dysphagia characterized by impaired timing with thin liquids that leads to trace amounts of penetration during the swallow. Although this is not necessarily atypical for his age, his cough is weak and he is not able to eject penetrates despite volitional coughing. When he takes sequential boluses of thin liquids, there is also trace amounts of silent aspiration. The amount of liquid that enters his airway is very small, but there is certainly the potential for him to aspirate across the entirety of a meal, particularly if he is not fully upright or if his respiratory status is compromised. Airway protection is improved with nectar thick liquids, and it is also improved with thin liquids with use of a brief oral hold before swallowing. Mild vallecular residue is present across consistencies, increasing mildly with more solid boluses. Given his recurrent PNA that is also acutely worsening, recommend regular diet and nectar thick liquids for now. Potential may be good to advance back to thin liquids with use of strategies as he overcomes acute illness. Would also recommend EMST to maximize swallow and cough. SLP Visit Diagnosis Dysphagia, pharyngeal phase (R13.13) Attention and concentration deficit following -- Frontal lobe and executive function deficit following -- Impact on safety and function Mild aspiration risk   CHL IP TREATMENT RECOMMENDATION 09/23/2018 Treatment Recommendations Therapy as outlined  in treatment plan below   Prognosis 09/23/2018 Prognosis for Safe Diet Advancement Good Barriers to Reach Goals -- Barriers/Prognosis Comment -- CHL IP DIET RECOMMENDATION 09/23/2018 SLP Diet Recommendations Regular solids;Nectar thick liquid Liquid Administration via Cup Medication Administration Whole meds with puree Compensations Slow rate;Small sips/bites Postural Changes Seated upright at 90 degrees   CHL IP OTHER RECOMMENDATIONS 09/23/2018 Recommended Consults -- Oral Care Recommendations Oral care BID Other Recommendations Order thickener from pharmacy;Prohibited food (jello, ice cream, thin soups);Remove water pitcher   CHL IP FOLLOW UP RECOMMENDATIONS 09/23/2018 Follow up Recommendations Home health SLP   CHL IP FREQUENCY AND DURATION 09/23/2018 Speech Therapy Frequency (ACUTE ONLY) min 2x/week Treatment Duration 2 weeks      CHL IP ORAL PHASE 09/23/2018 Oral Phase WFL Oral - Pudding Teaspoon -- Oral - Pudding Cup -- Oral - Honey Teaspoon -- Oral - Honey Cup -- Oral - Nectar Teaspoon -- Oral - Nectar Cup -- Oral - Nectar Straw -- Oral - Thin Teaspoon -- Oral - Thin Cup -- Oral - Thin Straw -- Oral - Puree -- Oral - Mech Soft -- Oral -  Regular -- Oral - Multi-Consistency -- Oral - Pill -- Oral Phase - Comment --  CHL IP PHARYNGEAL PHASE 09/23/2018 Pharyngeal Phase Impaired Pharyngeal- Pudding Teaspoon -- Pharyngeal -- Pharyngeal- Pudding Cup -- Pharyngeal -- Pharyngeal- Honey Teaspoon -- Pharyngeal -- Pharyngeal- Honey Cup -- Pharyngeal -- Pharyngeal- Nectar Teaspoon -- Pharyngeal -- Pharyngeal- Nectar Cup Pharyngeal residue - valleculae Pharyngeal -- Pharyngeal- Nectar Straw -- Pharyngeal -- Pharyngeal- Thin Teaspoon -- Pharyngeal -- Pharyngeal- Thin Cup Pharyngeal residue - valleculae;Penetration/Aspiration during swallow Pharyngeal Material enters airway, remains ABOVE vocal cords and not ejected out Pharyngeal- Thin Straw Penetration/Aspiration during swallow;Pharyngeal residue - valleculae Pharyngeal Material  enters airway, passes BELOW cords without attempt by patient to eject out (silent aspiration) Pharyngeal- Puree Pharyngeal residue - valleculae Pharyngeal -- Pharyngeal- Mechanical Soft Pharyngeal residue - valleculae Pharyngeal -- Pharyngeal- Regular -- Pharyngeal -- Pharyngeal- Multi-consistency -- Pharyngeal -- Pharyngeal- Pill WFL Pharyngeal -- Pharyngeal Comment --  CHL IP CERVICAL ESOPHAGEAL PHASE 09/23/2018 Cervical Esophageal Phase WFL Pudding Teaspoon -- Pudding Cup -- Honey Teaspoon -- Honey Cup -- Nectar Teaspoon -- Nectar Cup -- Nectar Straw -- Thin Teaspoon -- Thin Cup -- Thin Straw -- Puree -- Mechanical Soft -- Regular -- Multi-consistency -- Pill -- Cervical Esophageal Comment -- Venita Sheffield Nix 09/23/2018, 3:45 PM  Pollyann Glen, M.A. CCC-SLP Acute Rehabilitation Services Pager [phone] Office [phone]               CBC Recent Labs  Lab 09/20/18 0320 09/21/18 8366 09/22/18 0352 09/23/18 0521 09/24/18 0409  WBC 20.5* 19.7* 17.3* 17.7* 9.5  HGB 10.7* 11.6* 10.8* 10.5* 9.3*  HCT 33.1* 36.8* 33.3* 32.8* 29.4*  PLT 415* 522* 514* 508* 553*  MCV 94.6 94.8 92.8 95.1 95.5  MCH 30.6 29.9 30.1 30.4 30.2  MCHC 32.3 31.5 32.4 32.0 31.6  RDW 15.8* 15.9* 15.7* 15.9* 15.8*  LYMPHSABS 0.7 1.0 1.1 0.9 0.8  MONOABS 0.6 0.6 0.3 0.3 0.3  EOSABS 0.0 0.1 0.1 0.1 0.1  BASOSABS 0.0 0.0 0.0 0.0 0.0    Chemistries  Recent Labs  Lab 09/18/18 1228  09/20/18 0320 09/21/18 0821 09/22/18 0352 09/23/18 0521 09/24/18 0409  NA 133*   < > 136 136 135 134* 134*  K 3.9   < > 4.3 3.7 3.5 3.0* 3.1*  CL 96*   < > 101 100 97* 91* 88*  CO2 24   < > 28 27 29  34* 37*  GLUCOSE 94   < > 126* 103* 85 88 102*  BUN 18   < > 21 16 11 13 15   CREATININE 0.91   < > 0.66 0.73 0.58* 0.75 0.80  CALCIUM 8.4*   < > 8.9 9.0 8.5* 8.5* 8.2*  MG  --   --  1.6* 1.7 1.5* 1.8 1.9  AST 43*  --  29  --   --   --   --   ALT 17  --  19  --   --   --   --   ALKPHOS 83  --  72  --   --   --   --   BILITOT 1.1  --  0.5  --    --   --   --    < > = values in this interval not displayed.   ------------------------------------------------------------------------------------------------------------------ No results for input(s): CHOL, HDL, LDLCALC, TRIG, CHOLHDL, LDLDIRECT in the last 72 hours.  No results found for: HGBA1C ------------------------------------------------------------------------------------------------------------------ No results for input(s): TSH, T4TOTAL, T3FREE, THYROIDAB in the last 72  hours.  Invalid input(s): FREET3 ------------------------------------------------------------------------------------------------------------------ No results for input(s): VITAMINB12, FOLATE, FERRITIN, TIBC, IRON, RETICCTPCT in the last 72 hours.  Coagulation profile Recent Labs  Lab 09/18/18 1228  INR 1.2    No results for input(s): DDIMER in the last 72 hours.  Cardiac Enzymes No results for input(s): CKMB, TROPONINI, MYOGLOBIN in the last 168 hours.  Invalid input(s): CK ------------------------------------------------------------------------------------------------------------------    Component Value Date/Time   BNP 368.4 (H) 09/16/2018 6387     Roxan Hockey M.D on 09/24/2018 at 12:36 PM  Go to [email or web address] - for contact info  [facility] - Office  [phone]

## 2018-09-24 NOTE — Telephone Encounter (Signed)
Patient still admitted to hospital at this time.

## 2018-09-24 NOTE — TOC Progression Note (Signed)
Transition of Care (TOC) - Progression Note  Marvetta Gibbons RN,BSN Transitions of Care Cross Coverage 6E - RN Case Manager [phone]   Patient Details  Name: PRANISH AKHAVAN MRN: [id number] Date of Birth: 02-02-1933  Transition of Care [facility]) CM/SW Contact  Dahlia Client, Romeo Rabon, RN Phone Number: 09/24/2018, 3:43 PM  Clinical Narrative:    Noted pt will need home 02, spoke with daughter Ivin Booty, confirmed pt does not currently have home 02 in the home- has had home 02 in the past used [facility]- per daughter fine using [place] for Home 02 needs- pt will need qualifying note and 02 orders to be completed prior to discharge Also confirmed [facility] agency choice as [facility]- CM will made referral once [place] orders placed for transition home. Daughter also requesting info for meals on wheels or other resources- will provide info with discharge paperwork packet.   Expected Discharge Plan: [place] Barriers to Discharge: Continued Medical Work up  Expected Discharge Plan and Services Expected Discharge Plan: [place] In-house Referral: NA   Post Acute Care Choice: [place] arrangements for the past 2 months: Single Family Home                   DME Agency: [place] Agency: [facility]         Social Determinants of Health (SDOH) Interventions    Readmission Risk Interventions No flowsheet data found.

## 2018-09-25 ENCOUNTER — Telehealth: Payer: Self-pay | Admitting: Pulmonary Disease

## 2018-09-25 LAB — CULTURE, RESPIRATORY W GRAM STAIN: Culture: NORMAL

## 2018-09-25 LAB — CREATININE, SERUM
Creatinine, Ser: 0.69 mg/dL (ref 0.61–1.24)
GFR calc Af Amer: >60 mL/min (ref >60)
GFR calc non Af Amer: >60 mL/min (ref >60)

## 2018-09-25 MED ORDER — CLINDAMYCIN HCL 300 MG PO CAPS: 300.0000 mg | ORAL_CAPSULE | Freq: Three times a day (TID) | ORAL | 0 refills | Status: DC

## 2018-09-25 MED ORDER — ALBUTEROL SULFATE (2.5 MG/3ML) 0.083% IN NEBU: 2.5000 mg | INHALATION_SOLUTION | Freq: Four times a day (QID) | RESPIRATORY_TRACT | 12 refills | Status: DC | PRN

## 2018-09-25 MED ORDER — PANTOPRAZOLE SODIUM 40 MG PO TBEC: 40.0000 mg | DELAYED_RELEASE_TABLET | Freq: Every day | ORAL | 3 refills | Status: AC

## 2018-09-25 MED ORDER — RESOURCE THICKENUP CLEAR PO POWD: 125.0000 | ORAL | 5 refills | Status: DC | PRN

## 2018-09-25 MED ORDER — IPRATROPIUM-ALBUTEROL 0.5-2.5 (3) MG/3ML IN SOLN
3.0000 mL | Freq: Three times a day (TID) | RESPIRATORY_TRACT | Status: DC
  Administered 2018-09-25: 3 mL via RESPIRATORY_TRACT
  Filled 2018-09-25: qty 3

## 2018-09-25 MED ORDER — GUAIFENESIN ER 600 MG PO TB12: 600.0000 mg | ORAL_TABLET | Freq: Two times a day (BID) | ORAL | 0 refills | Status: AC

## 2018-09-25 MED ORDER — GUAIFENESIN-DM 100-10 MG/5ML PO SYRP: 10.0000 mL | ORAL_SOLUTION | ORAL | 0 refills | Status: AC | PRN

## 2018-09-25 NOTE — Progress Notes (Signed)
  Speech Language Pathology Treatment: Dysphagia  Patient Details Name: Ryan Coffey MRN: [id number] DOB: 1932-10-12 Today's Date: 09/25/2018 Time: 1740-8144 SLP Time Calculation (min) (ACUTE ONLY): 18 min  Assessment / Plan / Recommendation Clinical Impression  Plan for patient to discharge home today. Spoke with nurse, she reports pt has tolerated medications well in applesauce and meals. Observed patient with breakfast tray, nectar liquids and they were tolerated well. Recommend pudding thickened supplements instead of ensure as Ensure does not thicken well. Trials of ice chips were tolerated very well. Education completed with patient about aspiration precautions, diet and medication delivery. Continue nectar thickened liquids, however after good oral care, pt may have ice chips for pleasure. Patient acknowledged the need for thickened liquids and was pleased with trials of ice chips.    HPI HPI: Ryan Coffey is an 85 y.o. male who presents with weakness with RLL infiltrate on CXR. CT showed an extensive masslike consolidation of the dependent R lung. He was recently treated for thrush. He was admitted in March 2020 with PNA and daughter reports multiple cases of PNA so far this year. PMH inculdes: renal cell carcinoma in remission, COPD,  pituitary tumor,  hyperlipidemia, rheumatoid arthritis.      SLP Plan   Continue care plan       Recommendations   Regular diet, nectar thickened liquids, ice chips after good oral care.                 Oral Care Recommendations: Oral care BID Follow up Recommendations: None SLP Visit Diagnosis: Dysphagia, pharyngeal phase (R13.13)       [place], MA, CCC-SLP 09/25/2018 10:04 AM

## 2018-09-25 NOTE — TOC Transition Note (Signed)
Transition of Care [facility]) - CM/SW Discharge Note Marvetta Gibbons RN,BSN Transitions of Care Cross Coverage 6E - RN Case Manager [phone]   Patient Details  Name: Ryan Coffey MRN: [id number] Date of Birth: 23-Dec-1932  [facility]) CM/SW Contact:  Dawayne Patricia, RN Phone Number: 09/25/2018, 11:27 AM   Clinical Narrative:    Pt stable for transition home today, Orders placed for HHPT and DME home 02 and nebulizer, as per conversation with daughter on 7/22 referral made to [facility] per family choice- Dorian Pod with [facility] has accepted referral for HHPT and will f/u with pt/family for start of care. Pt will also need home 02- note placed per qualifying guidelines- notified Zach with [facility] for DME needs- portable tank to be delivered to room prior to discharge along with nebulizer. Per daughter pt has had home 02 in past with [facility] put was not currently on oxygen PTA- they are agreeable to [place] for home 02 needs. Daughter to provide transport home once portable tank delivered to bedside.    Final next level of care: [place] Barriers to Discharge: Barriers Resolved, No Barriers Identified   Patient Goals and CMS Choice Patient states their goals for this hospitalization and ongoing recovery are:: to get better CMS Medicare.gov Compare Post Acute Care list provided to:: Patient Represenative (must comment)(daughter) Choice offered to / list presented to : Adult Children  Discharge Placement  Home with [facility]                     Discharge Plan and Services In-house Referral: NA   Post Acute Care Choice: Home Health          DME Arranged: Oxygen, Nebulizer machine DME Agency: [facility] Date DME Agency Contacted: 09/25/18 Time DME Agency Contacted: 1000 Representative spoke with at DME Agency: [place]: PT [place]: [facility] Date [place]: 09/25/18 Time [place]: 1030 Representative spoke  with at [place]: [place] ([place]) Interventions     Readmission Risk Interventions Readmission Risk Prevention Plan 09/25/2018  Transportation Screening Complete  PCP or Specialist Appt within 5-7 Days Complete  Home Care Screening Complete  Medication Review (RN CM) Complete  Some recent data might be hidden

## 2018-09-25 NOTE — Progress Notes (Signed)
SATURATION QUALIFICATIONS: (This note is used to comply with regulatory documentation for home oxygen)  Patient Saturations on Room Air at Rest = 83%  Patient Saturations on Room Air while Ambulating = NA  Patient Saturations on 4 Liters of oxygen applied = 92%  Please briefly explain why patient needs home oxygen: o2 sats decreased to 83% on room air at rest

## 2018-09-25 NOTE — Progress Notes (Signed)
    SATURATION QUALIFICATIONS: (This note is used to comply with regulatory documentation for home oxygen)   Patient Saturations on Room Air at Rest = 83 %   Patient Saturations on Room Air while Ambulating = NA   Patient Saturations on 4 Liters of oxygen while Ambulating = 92 to 93 %       Patient needs continuous O2 at 4 L/min continuously via nasal cannula with humidifier, with gaseous portability and conserving device    Dx J44.1 Roxan Hockey, MD

## 2018-09-25 NOTE — Discharge Summary (Signed)
Ryan Coffey, is a 85 y.o. male  DOB Oct 17, 1932  MRN [id number].  Admission date:  09/18/2018  Admitting Physician  Vashti Hey, MD  Discharge Date:  09/25/2018   Primary MD  Prince Solian, MD  Recommendations for primary care physician for things to follow:  Hypoxic respiratory failure with generalized weakness and deconditioning   1) take clindamycin antibiotic 300 mg 3 times a day for the next 6 weeks as prescribed 2)Outpatient follow-up with Ryan Coffey the pulmonologist arranged for 10/07/18 at 9:30AM. 3) okay to have sips of water with ice chips while sitting up and without distraction, otherwise nectar thickened liquids advised 4) use nebulizer treatments as prescribed 5) call or return if 3 or more loose bowel movements for 2 consecutive days or more due to risk of C. difficile diarrhea   6)Use oxygen continuously as advised okay to use your home pulse oximeter to check your oxygen levels 7)A physical therapist to come to your house a couple times a week to help you with strengthening, improving your endurance and conditioning   Admission Diagnosis  HCAP (healthcare-associated pneumonia) [J18.9] Sepsis with acute hypoxic respiratory failure without septic shock, due to unspecified organism (Ryan Coffey) [A41.9, R65.20, J96.01]   Discharge Diagnosis  HCAP (healthcare-associated pneumonia) [J18.9] Sepsis with acute hypoxic respiratory failure without septic shock, due to unspecified organism (Ryan Coffey) [A41.9, R65.20, J96.01]    Principal Problem:   HAP (hospital-acquired pneumonia) Active Problems:   COPD (chronic obstructive pulmonary disease) ([place])   Seropositive rheumatoid arthritis ([place])   Panhypopituitarism ([place])   Immunocompromised state ([place])   History of renal cell carcinoma   Pressure injury of skin      Past Medical History:  Diagnosis Date   Arthritis    Benign tumor of  pituitary gland ([place])    "wasn't able to get it all"   COPD (chronic obstructive pulmonary disease) ([place])    Pneumonia 06/27/11   "first time"   Shingles     Past Surgical History:  Procedure Laterality Date   CHOLECYSTECTOMY OPEN  2010   KIDNEY SURGERY  2011   "lesion removed right side"   RENAL BIOPSY  2012   right   TRANSPHENOIDAL / TRANSNASAL HYPOPHYSECTOMY / RESECTION PITUITARY TUMOR  ~ 1979   "they couldn't get it all"       HPI  from the history and physical done on the day of admission:     Ryan Coffey is an 85 y.o. male with past medical history of renal cell carcinoma in remission, COPD,  pituitary tumor, hyperlipidemia, rheumatoid arthritis on MTX who states he was in his usual state of health until June 28 when he was given an inhaler (Symbicort) by his pulmonologist.  Patient notes that he almost immediately developed pain in the back of his throat and a coated tongue.  He states he has been unable to eat since then due to sore throat.  She was treated with nystatin by his PCP.  However notes his p.o. intake has decreased  significantly and he feels that this is led to weakness.  Patient presented to the ED 2 days ago for treatment of weakness where he was found to have a right lower lobe infiltrate as well as some mild renal insufficiency.  He was recommended to be admitted however after receiving some IV fluid resuscitation patient chose to go home.  He was discharged with a prescription for Levaquin which she states he did not fill.  According to his daughter he told them that he had been discharged and they did not know that he had pneumonia.  Patient now comes back in today with even worse weakness than he had previously.  Patient notes that he usually is able to get about his house without difficulty however over the past couple of days he has had difficulty just even walking to the kitchen due to weakness.  Patient denies shortness of breath per se.  He  attributes all of his decreased functionality to weakness.  No chest pain.  Patient does have a cough which is been present for 5 to 6 months.  He notes over the past 3 months it has been productive of a greenish-whitish phlegm.  Patient denies orthopnea or PND.  No palpitations.  He thinks he might be dizzy but he is not sure.  He has not had any falls.  No nausea vomiting or diarrhea.  As noted above he has had significant decreased p.o. intake.  Past medical history is notable for admission for pneumonia in March 2020.  Patient states that he was able to make full recovery after that admission and he had been riding his bike and feeling well in the interim until 2 weeks ago as noted above.  Of note however repeat chest x-ray showed persistent but resolving right lower lobe infiltrates.  Patient was seen by Ryan Coffey of low our pulmonary on 626 and high-res CT was ordered which was done on 09/12/2018 which showed "extensive somewhat masslike consolidation of the dependent right lung, centered about the superior segment of the right lower lobe although involving the right middle lobe as well.  Findings likely reflect infection or aspiration, although underlying mass is not excluded.  Recommend follow-up CT in 6 to 8 weeks".   ED Course:  The patient was noted to be febrile to 100.9 and be somewhat hypotensive at 95/55.  He was hypoxic with O2 saturations at 85 on room air.  Oratory data was notable for mild hyponatremia with a sodium of 133 and market leukocytosis with a WBC of 20 with left shift.  Chest x-ray shows continued dense consolidation of right middle and lower lobes as previously seen.  Patient is now willing to be admitted for ongoing treatment     Hospital Course:   Brief Narrative: 85 year old male with history of renal cell carcinoma in remission, COPD, pediatric tumor, hyperlipidemia, rheumatoid arthritis on methotrexate, pneumonia in March 2020 who presented to the ED 2 days ago for  treatment of weakness and was found to have right lower lobe infiltrate but patient was discharged home on oral Levaquin as per patient's request. Patient presented again on 09/18/2018 with worsening weakness, cough, very poor appetite and difficulty swallowing. He had a recent high-resolution CT chest done on 09/12/2018 as an outpatient as per Dr. Matilde Bash request which showed extensive somewhat masslike consolidation of the dependent right lung, centered about the superior segment of the right lower lobe although involving the right middle lobe as well. Findings likely reflected infection or aspiration,  although underlying mass is not excluded. He was found to be febrile in the ED along with hypoxia and leukocytosis. Patient was started on broad-spectrum antibiotics.Pulmonary was consulted on 09/22/2018 as respiratory status worsened and patient had a temperature spike.   A/p 1)Acute Hypoxic Respiratory Failure secondary to Cavitary HCAP--patient with multi-lobar right-sided pneumonia, cannot exclude aspiration component.  Speech eval/MBS eval noted -Cultures remain negative, per pulmonologist treat empirically with 6 weeks of oral clindamycin followed by repeat CT chest as outpatient in about 6 weeks --Risk of C. difficile infection discussed with patient and daughter Ryan Coffey , continue flutter valve/pulmonary toilet/incentive spirometry --- Continue to wean down oxygen currently down to 3 L/min via nasal cannula  2)Possible ILD secondary to Rheumatoid Arthritis--- patient will follow up with Ryan Coffey as outpatient post discharge,   3) immunosuppressed state--- patient has been on methotrexate for about 25 years now for rheumatoid arthritis---  4)COPD/Emphysema--- patient will follow-up at this time with Ryan Coffey on 10/07/2018 at 9:30 AM, will probably need PFTs repeated --- Continue bronchodilators and mucolytics  5)Panhypopituitarism secondary to pituitary adenoma -Stress doses  of steroids have been switched to home hydrocortisone regimen -Continue bromocriptine  6)Generalized Weakness/Deconditioning--- physical therapy evaluation appreciated, patient's daughters x 2  will be available to help him when he gets home starting 09/25/2018, will also need home O2 and home health PT  7)Hypothyroidism--- stable, continue Synthroid  8)Stage I pressure injury on the coccyx: Present on admission -Follow local wound care  9)HFpEF--- patient with chronic diastolic dysfunction CHF, EF 60 to 65%, overall stable, no evidence of overt CHF exacerbation at this time   Code Status : DNR  Family Communication:     (patient is alert, awake and coherent) Discussed with daughter Ms Mal Amabile on 09/24/18 and 09/25/18  Disposition Plan  :   discharge home on 09/25/2018 with home O2 and home physical therapy  -  Antimicrobials:  Cefepime and Zithromax from 09/18/2018-09/22/2018 Vancomycin from 09/18/2018-09/20/2018. Received 1 dose of vancomycin on 09/21/2016 Clindamycin from 09/22/2018 onwards  Consults  :  PCCM/Pulm consult  Discharge Condition: stable  Follow UP  Follow-up Information    Lauraine Rinne, NP Follow up on 10/07/2018.   Specialty: Pulmonary Disease Why: Your appointment is at 9:30AM. Contact information: [place] [address] [phone]            Consults obtained - pulm  Diet and Activity recommendation:  As advised  Discharge Instructions    Discharge Instructions    Call MD for:  difficulty breathing, headache or visual disturbances   Complete by: As directed    Call MD for:  persistant dizziness or light-headedness   Complete by: As directed    Call MD for:  persistant nausea and vomiting   Complete by: As directed    Call MD for:  severe uncontrolled pain   Complete by: As directed    Call MD for:  temperature >100.4   Complete by: As directed    Diet - low sodium heart healthy   Complete by: As  directed    Okay to have sips of water with ice chips while sitting up and without distraction, otherwise nectar thickened liquids advised   Discharge instructions   Complete by: As directed    1) take clindamycin antibiotic 300 mg 3 times a day for the next 6 weeks as prescribed 2)Outpatient follow-up with Ryan Coffey the pulmonologist arranged for 10/07/18 at 9:30AM. 3) okay to have sips  of water with ice chips while sitting up and without distraction, otherwise nectar thickened liquids advised 4) use nebulizer treatments as prescribed 5) call or return if 3 or more loose bowel movements for 2 consecutive days or more due to risk of C. difficile diarrhea   6)Use oxygen continuously as advised okay to use your home pulse oximeter to check your oxygen levels 7)A physical therapist to come to your house a couple times a week to help you with strengthening, improving your endurance and conditioning   Increase activity slowly   Complete by: As directed         Discharge Medications     Allergies as of 09/25/2018   No Known Allergies     Medication List    STOP taking these medications   levofloxacin 750 MG tablet Commonly known as: LEVAQUIN     TAKE these medications   acetaminophen 325 MG tablet Commonly known as: TYLENOL Take 650 mg by mouth 2 (two) times daily as needed (arthritis).   albuterol (2.5 MG/3ML) 0.083% nebulizer solution Commonly known as: PROVENTIL Take 3 mLs (2.5 mg total) by nebulization every 6 (six) hours as needed for wheezing or shortness of breath. Dx J44.1   bromocriptine 2.5 MG tablet Commonly known as: PARLODEL Take 2.5 mg by mouth 2 (two) times daily.   budesonide-formoterol 80-4.5 MCG/ACT inhaler Commonly known as: Symbicort Inhale 2 puffs into the lungs 2 (two) times a day.   Calcium 500 + D 500-125 MG-UNIT Tabs Generic drug: Calcium Carbonate-Vitamin D Take 1 tablet by mouth 2 (two) times daily.   clindamycin 300 MG capsule Commonly known  as: CLEOCIN Take 1 capsule (300 mg total) by mouth 3 (three) times daily. X 6 weeks   folic acid 1 MG tablet Commonly known as: FOLVITE TAKE 2 TABLETS EVERY DAY   guaiFENesin 600 MG 12 hr tablet Commonly known as: Mucinex Take 1 tablet (600 mg total) by mouth 2 (two) times daily for 10 days.   guaiFENesin-dextromethorphan 100-10 MG/5ML syrup Commonly known as: ROBITUSSIN DM Take 10 mLs by mouth every 4 (four) hours as needed for cough.   hydrocortisone 20 MG tablet Commonly known as: CORTEF Take 10-20 mg by mouth daily. 20 mg in the morning and 10mg  at night   levothyroxine 137 MCG tablet Commonly known as: SYNTHROID Take 137 mcg by mouth daily.   methotrexate 2.5 MG tablet Commonly known as: RHEUMATREX TAKE 4 TABLETS ON SATURDAY AND 4 TABLETS ON SUNDAY. CAUTION:CHEMOTHERAPY. PROTECT FROM LIGHT. What changed: See the new instructions.   pantoprazole 40 MG tablet Commonly known as: PROTONIX Take 1 tablet (40 mg total) by mouth daily. What changed: when to take this   pravastatin 80 MG tablet Commonly known as: PRAVACHOL Take 80 mg by mouth daily.   Resource ThickenUp Clear Powd Take 15,000 g by mouth as needed.   testosterone cypionate 200 MG/ML injection Commonly known as: DEPOTESTOSTERONE CYPIONATE Inject 75 mg into the muscle every 14 (fourteen) days.   zolpidem 6.25 MG CR tablet Commonly known as: Ambien CR Take 1 tablet (6.25 mg total) by mouth at bedtime as needed for sleep.            Durable Medical Equipment  (From admission, onward)         Start     Ordered   09/25/18 0951  For home use only DME Nebulizer/meds  Once    Comments:  Dx J44.1  Question Answer Comment  Patient needs a nebulizer to treat  with the following condition COPD (chronic obstructive pulmonary disease) ([place])   Length of Need Lifetime      09/25/18 0951   09/25/18 0950  For home use only DME oxygen  Once    Comments: SATURATION QUALIFICATIONS: (Thisnote is usedto  comply with regulatory documentation for home oxygen)  Patient Saturations on Room Air at Rest =83 %  Patient Saturations on [employer] while Ambulating =NA  Patient Saturations on4Liters of oxygen while Ambulating = 92 to 93 %    Patient needs continuous O2 at 4 L/min continuously via nasal cannula with humidifier, with gaseous portability and conserving device    Dx J44.1 Roxan Hockey, MD  Question Answer Comment  Length of Need Lifetime   Mode or (Route) Nasal cannula   Liters per Minute 4   Frequency Continuous (stationary and portable oxygen unit needed)   Oxygen conserving device Yes   Oxygen delivery system Gas      09/25/18 0950   09/25/18 0942  For home use only DME Nebulizer machine  Once    Comments: Dx J44.1  Question Answer Comment  Patient needs a nebulizer to treat with the following condition COPD with emphysema ([place])   Length of Need Lifetime      09/25/18 0942   09/24/18 1028  For home use only DME oxygen  Once    Question Answer Comment  Length of Need 6 Months   Mode or (Route) Nasal cannula   Oxygen delivery system Gas      09/24/18 1027          Major procedures and Radiology Reports - PLEASE review detailed and final reports for all details, in brief -   Dg Chest 2 View  Result Date: 09/22/2018 CLINICAL DATA:  Shortness of breath. EXAM: CHEST - 2 VIEW COMPARISON:  09/18/2018. FINDINGS: Prominent right mid/upper lung field and right base infiltrates again noted. Slight progression from prior exam. Cavitation in the right mid/upper infiltrate cannot be excluded. Low lung volumes. COPD. Right pleural effusion noted on today's exam. Fluid in the right major fissure most likely present. Heart size normal. Degenerative changes scoliosis thoracic spine. IMPRESSION: 1. Prominent right mid/upper lung field and right base infiltrates again noted. Slight progression from prior exam. Cavitation within the infiltrate in the right mid/upper lung  cannot be excluded. Low lung volumes. COPD. 2. Right pleural effusion noted on today's exam. Fluid in the right major fissure most likely present. Electronically Signed   By: Marcello Moores  Register   On: 09/22/2018 09:20   Ct Chest Wo Contrast  Result Date: 09/22/2018 CLINICAL DATA:  Abscess of lung or mediastinum, abnormal chest radiograph EXAM: CT CHEST WITHOUT CONTRAST TECHNIQUE: Multidetector CT imaging of the chest was performed following the standard protocol without IV contrast. COMPARISON:  Chest radiograph, 09/22/2018, CT chest, 09/12/2018 FINDINGS: Cardiovascular: Aortic atherosclerosis. Normal heart size. Three-vessel coronary artery calcifications. No pericardial effusion. Mediastinum/Nodes: No enlarged mediastinal, hilar, or axillary lymph nodes. Multiple thyroid nodules. Trachea, and esophagus demonstrate no significant findings. Lungs/Pleura: Severe emphysema. There is a large, cavitary lung lesion centered in the superior segment right lower lobe with an internal air and fluid level, measuring approximately 8.4 x 6.6 x 6.6 cm (series 4, image 67, series 6, image 37). There is extensive heterogeneous airspace disease of the right lower and middle lobes as well as a small right pleural effusion. Cavitation is new in comparison to prior examination dated 09/12/2018 and adjacent airspace disease is generally worsened and more consolidative. Upper Abdomen:  No acute abnormality. Musculoskeletal: No chest wall mass or suspicious bone lesions identified. IMPRESSION: 1. There is a large, cavitary lung lesion centered in the superior segment right lower lobe with an internal air and fluid level, measuring approximately 8.4 x 6.6 x 6.6 cm (series 4, image 67, series 6, image 37). There is extensive heterogeneous airspace disease of the right lower and middle lobes as well as a small right pleural effusion. Cavitation is new in comparison to prior examination dated 09/12/2018 and adjacent airspace disease is  generally worsened and more consolidative. Findings are most consistent with worsening, cavitating pneumonia. 2.  Emphysema. 3.  Aortic atherosclerosis and coronary artery disease. Electronically Signed   By: Eddie Candle M.D.   On: 09/22/2018 16:49   Ct Chest High Resolution  Result Date: 09/12/2018 CLINICAL DATA:  COPD exacerbation, productive cough EXAM: CT CHEST WITHOUT CONTRAST TECHNIQUE: Multidetector CT imaging of the chest was performed following the standard protocol without intravenous contrast. High resolution imaging of the lungs, as well as inspiratory and expiratory imaging, was performed. COMPARISON:  05/15/2018 FINDINGS: Cardiovascular: Aortic atherosclerosis. Extensive 3 vessel coronary artery calcifications. Normal heart size. No pericardial effusion. Mediastinum/Nodes: No enlarged mediastinal, hilar, or axillary lymph nodes. Thyroid gland, trachea, and esophagus demonstrate no significant findings. Lungs/Pleura: Severe emphysema. There is extensive, somewhat masslike consolidation of the dependent right lung, centered about the superior segment right lower lobe although also involving the middle lobe (series 3, image 55). No pleural effusion or pneumothorax. Upper Abdomen: No acute abnormality. Musculoskeletal: No chest wall mass or suspicious bone lesions identified. IMPRESSION: 1. There is extensive, somewhat masslike consolidation of the dependent right lung, centered about the superior segment right lower lobe although also involving the middle lobe (series 3, image 55). Findings likely reflect infection or aspiration, although underlying mass is not excluded. Recommend follow-up CT in 6-8 weeks to observe for resolution. 2.  Severe emphysema. 3.  Coronary artery disease and aortic atherosclerosis. Electronically Signed   By: Eddie Candle M.D.   On: 09/12/2018 15:46   Dg Chest Port 1 View  Result Date: 09/24/2018 CLINICAL DATA:  Respiratory failure.  COPD. EXAM: PORTABLE CHEST 1 VIEW  COMPARISON:  September 22, 2018 FINDINGS: The cavitated infiltrate in the right mid and lateral lower lung is stable. No pneumothorax. Minimal atelectasis in the left base. The cardiomediastinal silhouette is stable. No other interval changes. IMPRESSION: The cavitated infiltrate in the right mid and lateral lower lung is stable. Minimal atelectasis in the left base. No other change. Electronically Signed   By: Dorise Bullion III M.D   On: 09/24/2018 08:53   Dg Chest Port 1 View  Result Date: 09/18/2018 CLINICAL DATA:  Shortness of breath, fever and pneumonia. EXAM: PORTABLE CHEST 1 VIEW COMPARISON:  September 16, 2018 FINDINGS: The cardiac silhouette is stably enlarged. Calcific atherosclerotic disease of the aorta. Upper lobe predominant emphysema. No significant change in patchy airspace consolidation in the right mid and lower lung. Minimal peribronchial airspace consolidation the left lower lung. Osseous structures are without acute abnormality. Soft tissues are grossly normal. IMPRESSION: 1. No significant change in the patchy airspace consolidation in the right mid and lower lung. 2. Minimal peribronchial airspace consolidation in the left lower lung. Electronically Signed   By: Fidela Salisbury M.D.   On: 09/18/2018 12:48   Dg Chest Port 1 View  Result Date: 09/16/2018 CLINICAL DATA:  Shortness of breath EXAM: PORTABLE CHEST 1 VIEW COMPARISON:  September 12, 2018.  Chest x-ray 05/16/2018 FINDINGS:  Consolidation in the right mid and lower lung compatible with pneumonia. Left lung clear. Heart is normal size. No effusions or acute bony abnormality. IMPRESSION: Dense consolidation in the right mid and lower lung as seen on prior CT compatible with pneumonia. Electronically Signed   By: Rolm Baptise M.D.   On: 09/16/2018 22:14   Dg Swallowing Func-speech Pathology  Result Date: 09/23/2018 Objective Swallowing Evaluation: Type of Study: MBS-Modified Barium Swallow Study  Patient Details Name: Ryan Coffey  MRN: [id number] Date of Birth: 1932/04/26 Today's Date: 09/23/2018 Time: SLP Start Time (ACUTE ONLY): 1411 -SLP Stop Time (ACUTE ONLY): 6568 SLP Time Calculation (min) (ACUTE ONLY): 23 min Past Medical History: Past Medical History: Diagnosis Date  Arthritis   Benign tumor of pituitary gland ([place])   "wasn't able to get it all"  COPD (chronic obstructive pulmonary disease) ([place])   Pneumonia 06/27/11  "first time"  Shingles  Past Surgical History: Past Surgical History: Procedure Laterality Date  CHOLECYSTECTOMY OPEN  2010  KIDNEY SURGERY  2011  "lesion removed right side"  RENAL BIOPSY  2012  right  TRANSPHENOIDAL / TRANSNASAL HYPOPHYSECTOMY / RESECTION PITUITARY TUMOR  ~ 1979  "they couldn't get it all" HPI: Ryan Coffey is an 85 y.o. male who presents with weakness with RLL infiltrate on CXR. CT showed an extensive masslike consolidation of the dependent R lung. He was recently treated for thrush. He was admitted in March 2020 with PNA and daughter reports multiple cases of PNA so far this year. PMH inculdes: renal cell carcinoma in remission, COPD,  pituitary tumor,  hyperlipidemia, rheumatoid arthritis.  Subjective: pt denies any trouble swallowing Assessment / Plan / Recommendation CHL IP CLINICAL IMPRESSIONS 09/23/2018 Clinical Impression Pt presents with a mild pharyngeal dysphagia characterized by impaired timing with thin liquids that leads to trace amounts of penetration during the swallow. Although this is not necessarily atypical for his age, his cough is weak and he is not able to eject penetrates despite volitional coughing. When he takes sequential boluses of thin liquids, there is also trace amounts of silent aspiration. The amount of liquid that enters his airway is very small, but there is certainly the potential for him to aspirate across the entirety of a meal, particularly if he is not fully upright or if his respiratory status is compromised. Airway protection is improved with nectar  thick liquids, and it is also improved with thin liquids with use of a brief oral hold before swallowing. Mild vallecular residue is present across consistencies, increasing mildly with more solid boluses. Given his recurrent PNA that is also acutely worsening, recommend regular diet and nectar thick liquids for now. Potential may be good to advance back to thin liquids with use of strategies as he overcomes acute illness. Would also recommend EMST to maximize swallow and cough. SLP Visit Diagnosis Dysphagia, pharyngeal phase (R13.13) Attention and concentration deficit following -- Frontal lobe and executive function deficit following -- Impact on safety and function Mild aspiration risk   CHL IP TREATMENT RECOMMENDATION 09/23/2018 Treatment Recommendations Therapy as outlined in treatment plan below   Prognosis 09/23/2018 Prognosis for Safe Diet Advancement Good Barriers to Reach Goals -- Barriers/Prognosis Comment -- CHL IP DIET RECOMMENDATION 09/23/2018 SLP Diet Recommendations Regular solids;Nectar thick liquid Liquid Administration via Cup Medication Administration Whole meds with puree Compensations Slow rate;Small sips/bites Postural Changes Seated upright at 90 degrees   CHL IP OTHER RECOMMENDATIONS 09/23/2018 Recommended Consults -- Oral Care Recommendations Oral care BID Other Recommendations Order thickener  from pharmacy;Prohibited food (jello, ice cream, thin soups);Remove water pitcher   CHL IP FOLLOW UP RECOMMENDATIONS 09/23/2018 Follow up Recommendations Home health SLP   CHL IP FREQUENCY AND DURATION 09/23/2018 Speech Therapy Frequency (ACUTE ONLY) min 2x/week Treatment Duration 2 weeks      CHL IP ORAL PHASE 09/23/2018 Oral Phase WFL Oral - Pudding Teaspoon -- Oral - Pudding Cup -- Oral - Honey Teaspoon -- Oral - Honey Cup -- Oral - Nectar Teaspoon -- Oral - Nectar Cup -- Oral - Nectar Straw -- Oral - Thin Teaspoon -- Oral - Thin Cup -- Oral - Thin Straw -- Oral - Puree -- Oral - Mech Soft -- Oral -  Regular -- Oral - Multi-Consistency -- Oral - Pill -- Oral Phase - Comment --  CHL IP PHARYNGEAL PHASE 09/23/2018 Pharyngeal Phase Impaired Pharyngeal- Pudding Teaspoon -- Pharyngeal -- Pharyngeal- Pudding Cup -- Pharyngeal -- Pharyngeal- Honey Teaspoon -- Pharyngeal -- Pharyngeal- Honey Cup -- Pharyngeal -- Pharyngeal- Nectar Teaspoon -- Pharyngeal -- Pharyngeal- Nectar Cup Pharyngeal residue - valleculae Pharyngeal -- Pharyngeal- Nectar Straw -- Pharyngeal -- Pharyngeal- Thin Teaspoon -- Pharyngeal -- Pharyngeal- Thin Cup Pharyngeal residue - valleculae;Penetration/Aspiration during swallow Pharyngeal Material enters airway, remains ABOVE vocal cords and not ejected out Pharyngeal- Thin Straw Penetration/Aspiration during swallow;Pharyngeal residue - valleculae Pharyngeal Material enters airway, passes BELOW cords without attempt by patient to eject out (silent aspiration) Pharyngeal- Puree Pharyngeal residue - valleculae Pharyngeal -- Pharyngeal- Mechanical Soft Pharyngeal residue - valleculae Pharyngeal -- Pharyngeal- Regular -- Pharyngeal -- Pharyngeal- Multi-consistency -- Pharyngeal -- Pharyngeal- Pill WFL Pharyngeal -- Pharyngeal Comment --  CHL IP CERVICAL ESOPHAGEAL PHASE 09/23/2018 Cervical Esophageal Phase WFL Pudding Teaspoon -- Pudding Cup -- Honey Teaspoon -- Honey Cup -- Nectar Teaspoon -- Nectar Cup -- Nectar Straw -- Thin Teaspoon -- Thin Cup -- Thin Straw -- Puree -- Mechanical Soft -- Regular -- Multi-consistency -- Pill -- Cervical Esophageal Comment -- Ryan Coffey 09/23/2018, 3:45 PM  Ryan Coffey, M.A. [facility] Pager [phone] Office [phone]              Micro Results   Recent Results (from the past 240 hour(s))  SARS Coronavirus 2 (CEPHEID - Performed in [place] hospital lab), Hosp Order     Status: None   Collection Time: 09/16/18 10:03 PM   Specimen: Nasopharyngeal Swab  Result Value Ref Range Status   SARS Coronavirus 2 NEGATIVE NEGATIVE Final     Comment: (NOTE) If result is NEGATIVE SARS-CoV-2 target nucleic acids are NOT DETECTED. The SARS-CoV-2 RNA is generally detectable in upper and lower  respiratory specimens during the acute phase of infection. The lowest  concentration of SARS-CoV-2 viral copies this assay can detect is 250  copies / mL. A negative result does not preclude SARS-CoV-2 infection  and should not be used as the sole basis for treatment or other  patient management decisions.  A negative result may occur with  improper specimen collection / handling, submission of specimen other  than nasopharyngeal swab, presence of viral mutation(s) within the  areas targeted by this assay, and inadequate number of viral copies  (<250 copies / mL). A negative result must be combined with clinical  observations, patient history, and epidemiological information. If result is POSITIVE SARS-CoV-2 target nucleic acids are DETECTED. The SARS-CoV-2 RNA is generally detectable in upper and lower  respiratory specimens dur ing the acute phase of infection.  Positive  results are indicative of active infection with SARS-CoV-2.  Clinical  correlation with patient history and other diagnostic information is  necessary to determine patient infection status.  Positive results do  not rule out bacterial infection or co-infection with other viruses. If result is PRESUMPTIVE POSTIVE SARS-CoV-2 nucleic acids MAY BE PRESENT.   A presumptive positive result was obtained on the submitted specimen  and confirmed on repeat testing.  While 2019 novel coronavirus  (SARS-CoV-2) nucleic acids may be present in the submitted sample  additional confirmatory testing may be necessary for epidemiological  and / or clinical management purposes  to differentiate between  SARS-CoV-2 and other Sarbecovirus currently known to infect humans.  If clinically indicated additional testing with an alternate test  methodology [phone]) is advised. The  SARS-CoV-2 RNA is generally  detectable in upper and lower respiratory sp ecimens during the acute  phase of infection. The expected result is Negative. Fact Sheet for Patients:  StrictlyIdeas.no Fact Sheet for Healthcare Providers: BankingDealers.co.za This test is not yet approved or cleared by the [place] FDA and has been authorized for detection and/or diagnosis of SARS-CoV-2 by FDA under an Emergency Use Authorization (EUA).  This EUA will remain in effect (meaning this test can be used) for the duration of the COVID-19 declaration under Section 564(b)(1) of the Act, 21 U.S.C. section 360bbb-3(b)(1), unless the authorization is terminated or revoked sooner. Performed at [facility], [place] [address] [place], [place] [address]   Blood Culture (routine x 2)     Status: None   Collection Time: 09/18/18 12:35 PM   Specimen: BLOOD RIGHT FOREARM  Result Value Ref Range Status   Specimen Description BLOOD RIGHT FOREARM  Final   Special Requests   Final    BOTTLES DRAWN AEROBIC AND ANAEROBIC Blood Culture adequate volume   Culture   Final    NO GROWTH 5 DAYS Performed at [facility], [place] [address] [place], [place] [address]    Report Status 09/23/2018 FINAL  Final  Blood Culture (routine x 2)     Status: None   Collection Time: 09/18/18 12:49 PM   Specimen: BLOOD LEFT FOREARM  Result Value Ref Range Status   Specimen Description BLOOD LEFT FOREARM  Final   Special Requests   Final    BOTTLES DRAWN AEROBIC AND ANAEROBIC Blood Culture adequate volume   Culture   Final    NO GROWTH 5 DAYS Performed at [facility], [place] [address] [place], [place] [address]    Report Status 09/23/2018 FINAL  Final  SARS Coronavirus 2 (CEPHEID - Performed in [place] hospital lab), Hosp Order     Status: None   Collection Time: 09/18/18  2:47 PM   Specimen: Nasopharyngeal Swab  Result Value Ref Range Status   SARS  Coronavirus 2 NEGATIVE NEGATIVE Final    Comment: (NOTE) If result is NEGATIVE SARS-CoV-2 target nucleic acids are NOT DETECTED. The SARS-CoV-2 RNA is generally detectable in upper and lower  respiratory specimens during the acute phase of infection. The lowest  concentration of SARS-CoV-2 viral copies this assay can detect is 250  copies / mL. A negative result does not preclude SARS-CoV-2 infection  and should not be used as the sole basis for treatment or other  patient management decisions.  A negative result may occur with  improper specimen collection / handling, submission of specimen other  than nasopharyngeal swab, presence of viral mutation(s) within the  areas targeted by this assay, and inadequate number of viral copies  (<250 copies /  mL). A negative result must be combined with clinical  observations, patient history, and epidemiological information. If result is POSITIVE SARS-CoV-2 target nucleic acids are DETECTED. The SARS-CoV-2 RNA is generally detectable in upper and lower  respiratory specimens dur ing the acute phase of infection.  Positive  results are indicative of active infection with SARS-CoV-2.  Clinical  correlation with patient history and other diagnostic information is  necessary to determine patient infection status.  Positive results do  not rule out bacterial infection or co-infection with other viruses. If result is PRESUMPTIVE POSTIVE SARS-CoV-2 nucleic acids MAY BE PRESENT.   A presumptive positive result was obtained on the submitted specimen  and confirmed on repeat testing.  While 2019 novel coronavirus  (SARS-CoV-2) nucleic acids may be present in the submitted sample  additional confirmatory testing may be necessary for epidemiological  and / or clinical management purposes  to differentiate between  SARS-CoV-2 and other Sarbecovirus currently known to infect humans.  If clinically indicated additional testing with an alternate test    methodology [phone]) is advised. The SARS-CoV-2 RNA is generally  detectable in upper and lower respiratory sp ecimens during the acute  phase of infection. The expected result is Negative. Fact Sheet for Patients:  StrictlyIdeas.no Fact Sheet for Healthcare Providers: BankingDealers.co.za This test is not yet approved or cleared by the [place] FDA and has been authorized for detection and/or diagnosis of SARS-CoV-2 by FDA under an Emergency Use Authorization (EUA).  This EUA will remain in effect (meaning this test can be used) for the duration of the COVID-19 declaration under Section 564(b)(1) of the Act, 21 U.S.C. section 360bbb-3(b)(1), unless the authorization is terminated or revoked sooner. Performed at [facility], [place] [address] [place], [place] [address]   MRSA PCR Screening     Status: None   Collection Time: 09/19/18  1:00 PM   Specimen: Nasal Mucosa; Nasopharyngeal  Result Value Ref Range Status   MRSA by PCR NEGATIVE NEGATIVE Final    Comment:        The GeneXpert MRSA Assay (FDA approved for NASAL specimens only), is one component of a comprehensive MRSA colonization surveillance program. It is not intended to diagnose MRSA infection nor to guide or monitor treatment for MRSA infections. Performed at [facility], [place] [address], [place], [place] [address]   Urine Culture     Status: None   Collection Time: 09/19/18  8:44 PM   Specimen: In/Out Cath Urine  Result Value Ref Range Status   Specimen Description IN/OUT CATH URINE  Final   Special Requests NONE  Final   Culture   Final    NO GROWTH Performed at [facility], [place] [address], [place], [place] [address]    Report Status 09/21/2018 FINAL  Final  Culture, blood (routine x 2)     Status: None (Preliminary result)   Collection Time: 09/22/18  8:48 AM   Specimen: BLOOD RIGHT HAND  Result Value Ref Range Status   Specimen  Description BLOOD RIGHT HAND  Final   Special Requests   Final    BOTTLES DRAWN AEROBIC ONLY Blood Culture results may not be optimal due to an inadequate volume of blood received in culture bottles   Culture   Final    NO GROWTH 3 DAYS Performed at [facility], [place] [address] [place], [place] [address]    Report Status PENDING  Incomplete  Culture, blood (routine x 2)  Status: None (Preliminary result)   Collection Time: 09/22/18  8:48 AM   Specimen: BLOOD RIGHT HAND  Result Value Ref Range Status   Specimen Description BLOOD RIGHT HAND  Final   Special Requests   Final    BOTTLES DRAWN AEROBIC ONLY Blood Culture adequate volume   Culture   Final    NO GROWTH 3 DAYS Performed at [facility], [address], [place], [place] [address]    Report Status PENDING  Incomplete  Expectorated sputum assessment w rflx to resp cult     Status: None   Collection Time: 09/23/18  5:26 AM   Specimen: Expectorated Sputum  Result Value Ref Range Status   Specimen Description EXPECTORATED SPUTUM  Final   Special Requests NONE  Final   Sputum evaluation   Final    THIS SPECIMEN IS ACCEPTABLE FOR SPUTUM CULTURE Performed at [facility], [place] [address] [place], [place] [address]    Report Status 09/24/2018 FINAL  Final  Culture, respiratory     Status: None   Collection Time: 09/23/18  5:26 AM  Result Value Ref Range Status   Specimen Description EXPECTORATED SPUTUM  Final   Special Requests NONE Reflexed from [id number]  Final   Gram Stain   Final    FEW WBC PRESENT,BOTH PMN AND MONONUCLEAR MODERATE GRAM POSITIVE COCCI IN CLUSTERS RARE GRAM NEGATIVE RODS RARE GRAM POSITIVE RODS    Culture   Final    Consistent with normal respiratory flora. Performed at [facility], [place] [address], [place], [place] [address]    Report Status 09/25/2018 FINAL  Final       Today   Subjective    Ryan Coffey today has no fevers  --Cough and shortness of breath  improving,        Patient has been seen and examined prior to discharge   Objective   Blood pressure (!) 120/55, pulse 85, temperature 98.1 F (36.7 C), temperature source Oral, resp. rate 20, height 5\' 8"  (1.727 m), weight 67.9 kg, SpO2 95 %.   Intake/Output Summary (Last 24 hours) at 09/25/2018 0955 Last data filed at 09/25/2018 0605 Gross per 24 hour  Intake 900 ml  Output 700 ml  Net 200 ml    Exam Gen:- Awake Alert,  resting  HEENT:- [place].AT, No sclera icterus Nose- [place] 4 L/min Neck-Supple Neck,No JVD,.  Lungs-improvement in movement, no wheezing CV- S1, S2 normal, regular  Abd-  +ve B.Sounds, Abd Soft, No tenderness,    Extremity/Skin:- No  edema, pedal pulses present  Psych-affect is appropriate, oriented x3 Neuro-generalized weakness, but no new focal deficits, no tremors   Data Review   CBC w Diff:  Lab Results  Component Value Date   WBC 9.5 09/24/2018   HGB 9.3 (L) 09/24/2018   HCT 29.4 (L) 09/24/2018   PLT 553 (H) 09/24/2018   LYMPHOPCT 8 09/24/2018   MONOPCT 3 09/24/2018   EOSPCT 2 09/24/2018   BASOPCT 0 09/24/2018    CMP:  Lab Results  Component Value Date   NA 134 (L) 09/24/2018   K 3.1 (L) 09/24/2018   CL 88 (L) 09/24/2018   CO2 37 (H) 09/24/2018   BUN 15 09/24/2018   CREATININE 0.69 09/25/2018   CREATININE 0.84 07/30/2018   PROT 4.7 (L) 09/20/2018   ALBUMIN 1.5 (L) 09/20/2018   BILITOT 0.5 09/20/2018   ALKPHOS 72 09/20/2018   AST 29 09/20/2018   ALT 19 09/20/2018  .  Total Discharge time is about 33 minutes  Roxan Hockey M.D on 09/25/2018 at 9:55 AM  Go to [email or web address] -  for contact info  [facility] - Office  [phone]

## 2018-09-25 NOTE — Discharge Instructions (Signed)
Hypoxic respiratory failure with generalized weakness and deconditioning    1) take clindamycin antibiotic 300 mg 3 times a day for the next 6 weeks as prescribed 2)Outpatient follow-up with Dr. Vaughan Browner the pulmonologist arranged for 10/07/18 at 9:30AM. 3) okay to have sips of water with ice chips while sitting up and without distraction, otherwise nectar thickened liquids advised 4) use nebulizer treatments as prescribed 5) call or return if 3 or more loose bowel movements for 2 consecutive days or more due to risk of C. difficile diarrhea   6)Use oxygen continuously as advised okay to use your home pulse oximeter to check your oxygen levels 7)A physical therapist to come to your house a couple times a week to help you with strengthening, improving your endurance and conditioning   Aspiration Pneumonia Aspiration pneumonia is an infection in the lungs. It occurs when saliva or liquid contaminated with bacteria is inhaled (aspirated) into the lungs. When these things get into the lungs, swelling (inflammation) and infection can occur. This can make it difficult to breathe. Aspiration pneumonia is a serious condition and can be life threatening. What are the causes? This condition is caused when saliva or liquid from the mouth, throat, or stomach is inhaled into the lungs, and when those fluids are contaminated with bacteria. What increases the risk? The following factors may make you more likely to develop this condition:  A narrowing of the tube that carries food to the stomach (esophageal narrowing).  Having gastroesophageal reflux disease (GERD).  Having a weak immune system.  Having diabetes.  Having poor oral hygiene.  Being malnourished. The condition is more likely to occur when a person's cough (gag) reflex, or ability to swallow, has decreased. Some things that can cause this decrease include:  Having a brain injury or disease, such as stroke, seizures, Parkinson disease,  dementia, or amyotrophic lateral sclerosis (ALS).  Being given a general anesthetic for procedures.  Drinking too much alcohol. If a person passes out and vomits, vomit can be inhaled into the lungs.  Taking certain medicines, such as tranquilizers or sedatives. What are the signs or symptoms? Symptoms of this condition include:  Fever.  A cough with secretions that are yellow, tan, or green.  Breathing problems, such as wheezing or shortness of breath.  Chest pain.  Being more tired than usual (fatigue).  Having a history of coughing while eating or drinking.  Bad breath.  Bluish color to the lips, skin, or fingers. How is this diagnosed? This condition may be diagnosed based on:  A physical exam.  Tests, such as: ? Chest X-ray. ? Sputum culture. Saliva and mucus (sputum) are collected from the lungs or the tubes that carry air to the lungs (bronchi). The sputum is then tested for bacteria. ? Oximetry. A sensor or clip is placed on areas such as a finger, earlobe, or toe to measure the oxygen level in your blood. ? Blood tests. ? Swallowing study. This test looks at how food is swallowed and whether it goes into your breathing tube (trachea) or esophagus. ? Bronchoscopy. This test uses a flexible tube (bronchoscope) to see inside the lungs. How is this treated? This condition may be treated with:  Medicines. Antibiotic medicine will be given to kill the pneumonia bacteria. Other medicines may also be used to reduce fever or pain.  Breathing assistance and oxygen therapy. Depending on how well you are breathing, you may need to be given oxygen, or you may need breathing support from a  breathing machine (ventilator).  Thoracentesis. This is a procedure to remove fluid that has built up in the space between the linings of the chest wall and the lungs.  Feeding tube and diet change. For people who have difficulty swallowing, a feeding tube might be placed in the stomach, or  they may be asked to avoid certain food textures or liquids when eating. Follow these instructions at home: Medicines  Take over-the-counter and prescription medicines only as told by your health care provider. ? If you were prescribed an antibiotic medicine, take it as told by your health care provider. Do not stop taking the antibiotic even if you start to feel better. ? Take cough medicine only if you are losing sleep. Cough medicine can prevent your bodys natural ability to remove mucus from your lungs. General instructions  Carefully follow any eating instructions you were given, such as avoiding certain food textures or thickening your liquids. Thickening liquids reduces the risk of developing aspiration pneumonia again.  Use breathing exercises such as postural drainage, deep breathing, and incentive spirometry to help expel secretions.  Rest as instructed by your health care provider.  Sleep in a semi-upright position at night. Try to sleep in a reclining chair, or place a few pillows under your head.  Do not use any products that contain nicotine or tobacco, such as cigarettes and e-cigarettes. If you need help quitting, ask your health care provider.  Keep all follow-up visits as told by your health care provider. This is important. Contact a health care provider if:  You have a fever.  You have a worsening cough with yellow, tan, or green secretions.  You have coughing while eating or drinking. Get help right away if:  You have worsening shortness of breath, wheezing, or difficulty breathing.  You have chest pain. Summary  Aspiration pneumonia is an infection in the lungs. It is caused when saliva or liquid from the mouth, throat, or stomach is inhaled into the lungs.  Aspiration pneumonia is more likely to occur when a person's cough reflex or ability to swallow has decreased.  Symptoms of aspiration pneumonia include coughing, breathing problems, fever, and chest  pain.  Aspiration pneumonia may be treated with antibiotic medicine, other medicines to reduce pain or fever, and breathing assistance or oxygen therapy. This information is not intended to replace advice given to you by your health care provider. Make sure you discuss any questions you have with your health care provider. Document Released: 12/17/2008 Document Revised: 02/01/2017 Document Reviewed: 03/27/2016 Elsevier Patient Education  2020 [employer].

## 2018-09-25 NOTE — Telephone Encounter (Signed)
Will wait for Ryan Coffey's instruction after 10/07/2018 ov -pr

## 2018-09-25 NOTE — Telephone Encounter (Signed)
At this time,Patient is still currently admitted at [facility].

## 2018-09-25 NOTE — Care Management Important Message (Signed)
Important Message  Patient Details  Name: ABEDNEGO YEATES MRN: [id number] Date of Birth: 21-Jan-1933   Medicare Important Message Given:  Yes     Shelda Altes 09/25/2018, 1:44 PM

## 2018-09-26 ENCOUNTER — Telehealth: Payer: Self-pay | Admitting: Pulmonary Disease

## 2018-09-26 ENCOUNTER — Telehealth: Payer: Self-pay | Admitting: Rheumatology

## 2018-09-26 MED ORDER — ANORO ELLIPTA 62.5-25 MCG/INH IN AEPB: 1.0000 | INHALATION_SPRAY | Freq: Every day | RESPIRATORY_TRACT | 0 refills | Status: DC

## 2018-09-26 MED ORDER — METHOTREXATE 2.5 MG PO TABS: ORAL_TABLET | ORAL | 0 refills | Status: DC

## 2018-09-26 NOTE — Telephone Encounter (Signed)
I would try Stiolto or Anoro. Which ever is covered. Please discontinue TRELEGY and symbicort on medication list.

## 2018-09-26 NOTE — Telephone Encounter (Signed)
Spoke with pt's daughter Ivin Booty (dpr on file), states that pt had an adverse reaction to Trelegy: thrush, decreased appetite, developed PNA.  Pt was hospitalized for pna and was discharged yesterday with Symbicort, but pt and daughter are skeptical to start Symbicort since the box lists similar side effects as Trelegy.  Daughter states that pt had been rinsing mouth after using inhaler.  Would like recommendations on alternative inhaler.  Daughter asks that this be routed to NP instead of Dr. Vaughan Browner since he is not in office today.  Ph[facility]: [facility] [address].   Routing to [place] for recommendations- please advise.  Thanks!

## 2018-09-26 NOTE — Telephone Encounter (Signed)
Spoke with daughter again, would prefer to try Anoro since it is similar to Trelegy.  Sample left up front for pickup.  Nothing further needed at this time- will close encounter.

## 2018-09-26 NOTE — Telephone Encounter (Signed)
Patient is scheduled to see Brian,NP, 10/07/18. Patient had CT chest w/out contrast at [facility], 09/22/18.

## 2018-09-26 NOTE — Telephone Encounter (Signed)
Daughter calling because [facility] [address] [facility] not [facility] from [facility]/10/2018 for patients MTX. Patient is completely out, and due to take it tomorrow. Please resend rx to pharmacy. Please call daughter when rx is resent.

## 2018-09-26 NOTE — Telephone Encounter (Signed)
Last visit: 07/30/18 Next Visit: 12/30/18 Labs: 07/30/18 stable   Okay to refill per Dr. Latrelle Dodrill with pharmacy and gave a verbal authorization to refill as the pharmacist states they did not receive the last 2 electronic prescriptions sent .

## 2018-09-27 LAB — CULTURE, BLOOD (ROUTINE X 2)
Culture: NO GROWTH
Culture: NO GROWTH
Special Requests: ADEQUATE

## 2018-10-02 DIAGNOSIS — Z85828 Personal history of other malignant neoplasm of skin: Secondary | ICD-10-CM | POA: Diagnosis not present

## 2018-10-02 DIAGNOSIS — Z8546 Personal history of malignant neoplasm of prostate: Secondary | ICD-10-CM | POA: Diagnosis not present

## 2018-10-02 DIAGNOSIS — Z87891 Personal history of nicotine dependence: Secondary | ICD-10-CM | POA: Diagnosis not present

## 2018-10-02 DIAGNOSIS — E039 Hypothyroidism, unspecified: Secondary | ICD-10-CM | POA: Diagnosis not present

## 2018-10-02 DIAGNOSIS — J189 Pneumonia, unspecified organism: Secondary | ICD-10-CM | POA: Diagnosis not present

## 2018-10-02 DIAGNOSIS — J440 Chronic obstructive pulmonary disease with acute lower respiratory infection: Secondary | ICD-10-CM | POA: Diagnosis not present

## 2018-10-02 DIAGNOSIS — I5030 Unspecified diastolic (congestive) heart failure: Secondary | ICD-10-CM | POA: Diagnosis not present

## 2018-10-02 DIAGNOSIS — Z9181 History of falling: Secondary | ICD-10-CM | POA: Diagnosis not present

## 2018-10-02 DIAGNOSIS — Z85118 Personal history of other malignant neoplasm of bronchus and lung: Secondary | ICD-10-CM | POA: Diagnosis not present

## 2018-10-02 DIAGNOSIS — M1990 Unspecified osteoarthritis, unspecified site: Secondary | ICD-10-CM | POA: Diagnosis not present

## 2018-10-02 DIAGNOSIS — M059 Rheumatoid arthritis with rheumatoid factor, unspecified: Secondary | ICD-10-CM | POA: Diagnosis not present

## 2018-10-03 DIAGNOSIS — R1310 Dysphagia, unspecified: Secondary | ICD-10-CM | POA: Diagnosis not present

## 2018-10-03 DIAGNOSIS — D649 Anemia, unspecified: Secondary | ICD-10-CM | POA: Diagnosis not present

## 2018-10-03 DIAGNOSIS — J449 Chronic obstructive pulmonary disease, unspecified: Secondary | ICD-10-CM | POA: Diagnosis not present

## 2018-10-03 DIAGNOSIS — E876 Hypokalemia: Secondary | ICD-10-CM | POA: Diagnosis not present

## 2018-10-03 DIAGNOSIS — J849 Interstitial pulmonary disease, unspecified: Secondary | ICD-10-CM | POA: Diagnosis not present

## 2018-10-03 DIAGNOSIS — E230 Hypopituitarism: Secondary | ICD-10-CM | POA: Diagnosis not present

## 2018-10-03 DIAGNOSIS — J189 Pneumonia, unspecified organism: Secondary | ICD-10-CM | POA: Diagnosis not present

## 2018-10-03 DIAGNOSIS — L89151 Pressure ulcer of sacral region, stage 1: Secondary | ICD-10-CM | POA: Diagnosis not present

## 2018-10-03 DIAGNOSIS — I5032 Chronic diastolic (congestive) heart failure: Secondary | ICD-10-CM | POA: Diagnosis not present

## 2018-10-03 DIAGNOSIS — R5381 Other malaise: Secondary | ICD-10-CM | POA: Diagnosis not present

## 2018-10-03 DIAGNOSIS — E039 Hypothyroidism, unspecified: Secondary | ICD-10-CM | POA: Diagnosis not present

## 2018-10-03 DIAGNOSIS — J9601 Acute respiratory failure with hypoxia: Secondary | ICD-10-CM | POA: Diagnosis not present

## 2018-10-06 NOTE — Progress Notes (Signed)
@Patient  ID: Ryan Coffey, male    DOB: [birth year], 85 y.o.   MRN: [id number]  Chief Complaint  Patient presents with   Hospitalization Follow-up    Emphysema, cavitating pnu, discharged on 09/25/2018    Referring provider: Prince Solian, MD  HPI:  85 year old male former smoker referred to our office on 08/29/2018 for suspected COPD, emphysema seen on CT scan, and chronic cough  PMH: History of seropositive rheumatoid arthritis (on methotrexate for 25+ years), renal cell cancer in remission, hyperlipidemia, pituitary tumor, admitted March/2020 for multilobular pneumonia -never tested for SARS-CoV-2, follow-up with infectious disease in April/2020 felt that this was consistent with a SARS-CoV-2 infection -April/2020 SARS-CoV-2 test was negative Smoker/ Smoking History: Former smoker.  Quit 1982.  10-pack-year smoking history. Maintenance:  Trelegy Ellipta  Pt of: Dr. Vaughan Browner  10/07/2018  - Visit   85 year old male former smoker followed in our office for suspected COPD (no formal pulmonary function test) as well as emphysema seen on the CT.  Patient also has seropositive rheumatoid arthritis and is managed on methotrexate by Dr. Estanislado Pandy.  Patient was recently placed on Trelegy Ellipta which patient attributes to his hospital admission.  Patient was hospitalized on 09/18/2018 and discharged on 09/25/2018.  Patient feels that since being discharged from hospital he has done better.  He feels that he is clinically improving each day.  Patient's high-resolution CT chest that was completed on 09/12/2018 shows a pneumonia likely aspiration pneumonia.  A follow-up CT chest shows a worsening cavitating pneumonia.  See CT results listed below:  09/12/2018-CT chest high-res- extensive somewhat masslike consolidation on dependent right lung centered around superior segment right lower lobe although also involving the middle lobe, findings likely reflect infection or aspiration although underlying mass  is not excluded recommend follow-up CT in 6 to 8 weeks to observe resolution, severe emphysema, CAD  09/22/2018-CT chest without contrast- there is a large cavitary lung lesion centered in the superior segment right lower lobe with an internal air and fluid level measuring 8.4 x 6.6 x 6.6 cm there is extensive heterogeneous airspace disease of the right lower lobe and middle lobe as well as small right pleural effusion, cavitation is a new in comparison to prior exam on 09/12/2018, findings are most consistent with worsening cavitating pneumonia, emphysema, aortic arthrosclerosis   Since being discharged from the hospital patient has been maintained on oxygen therapy.  Patient also has a flutter valve as well as incentive spirometer that he has been using sporadically.  Patient also reports that he is working with home PT.  Patient was also given swallowing guidelines based off of his mild aspiration risk.  Unfortunately the patient has not been following these as closely as recommended.  Patient reports that he does not like using the thickener with his water.  Patient had one episode of an elevated temperature last week around 102, recommended to go to an emergency room which they did not do.  Patient reports that he has not had elevated temp since then.  He had a mildly elevated temperature of 99 a few days ago.  Overall though clinically patient symptoms have been improving, he feels that he is able to do more as well as he is coughing up less sputum.  Patient has stopped all of his inhalers as he feels that the Trelegy Ellipta is what caused him to have a pneumonia.  He is maintained simply on albuterol nebulized meds 2 times daily at this time.     Tests:  Imaging: CTA 05/15/2018-no pulmonary embolus, emphysema with bilateral lower lobe and right middle lobe tree-in-bud, peribronchial opacities consistent with pneumonia.  Small cystic lesions in both upper kidneys.  Chronic thyroid goiter.   Calcified aortic and coronary atherosclerosis.   Chest x-ray (primary care) 06/09/2018- right lower lobe infiltrate.  Chest x-ray (primary care) 06/23/2018-resolving but persistent infiltrate of right lung base  09/12/2018-CT chest high-res- extensive somewhat masslike consolidation on dependent right lung centered around superior segment right lower lobe although also involving the middle lobe, findings likely reflect infection or aspiration although underlying mass is not excluded recommend follow-up CT in 6 to 8 weeks to observe resolution, severe emphysema, CAD  09/22/2018-CT chest without contrast- there is a large cavitary lung lesion centered in the superior segment right lower lobe with an internal air and fluid level measuring 8.4 x 6.6 x 6.6 cm there is extensive heterogeneous airspace disease of the right lower lobe and middle lobe as well as small right pleural effusion, cavitation is a new in comparison to prior exam on 09/12/2018, findings are most consistent with worsening cavitating pneumonia, emphysema, aortic arthrosclerosis  09/23/2018-swallow eval- mild aspiration risk- oral care twice daily, order thickener from pharmacy, prohibited foods Jell-O, ice cream, thin soups, remove water pitcher, home health SLP  Labs: Tested for SARS-CoV-2 at primary care on 06/17/2018-negative  CBC 07/30/2018-WBC 10.5, eos 2.1%, absolute eosinophil count 221  08/29/2018-CBC with differential- eosinophils relative 1.5, eosinophils absolute 0.1 08/29/2018-IgE-3 08/29/2018-alpha-1-196, phenotype is PI*MM  09/18/2018-SARS-CoV-2-negative  09/14/2018-echocardiogram-LV ejection fraction 60 to 65%  SIX MIN WALK 10/07/2018  Supplimental Oxygen during Test? (L/min) Yes  O2 Flow Rate 4  Type Continuous  Tech Comments: Pt. walked a slow pace on room air and dropped stats and was put on 4L. ER     FENO:  No results found for: NITRICOXIDE  PFT: No flowsheet data found.  Imaging: Dg Chest 2 View  Result  Date: 09/22/2018 CLINICAL DATA:  Shortness of breath. EXAM: CHEST - 2 VIEW COMPARISON:  09/18/2018. FINDINGS: Prominent right mid/upper lung field and right base infiltrates again noted. Slight progression from prior exam. Cavitation in the right mid/upper infiltrate cannot be excluded. Low lung volumes. COPD. Right pleural effusion noted on today's exam. Fluid in the right major fissure most likely present. Heart size normal. Degenerative changes scoliosis thoracic spine. IMPRESSION: 1. Prominent right mid/upper lung field and right base infiltrates again noted. Slight progression from prior exam. Cavitation within the infiltrate in the right mid/upper lung cannot be excluded. Low lung volumes. COPD. 2. Right pleural effusion noted on today's exam. Fluid in the right major fissure most likely present. Electronically Signed   By: Marcello Moores  Register   On: 09/22/2018 09:20   Ct Chest Wo Contrast  Result Date: 09/22/2018 CLINICAL DATA:  Abscess of lung or mediastinum, abnormal chest radiograph EXAM: CT CHEST WITHOUT CONTRAST TECHNIQUE: Multidetector CT imaging of the chest was performed following the standard protocol without IV contrast. COMPARISON:  Chest radiograph, 09/22/2018, CT chest, 09/12/2018 FINDINGS: Cardiovascular: Aortic atherosclerosis. Normal heart size. Three-vessel coronary artery calcifications. No pericardial effusion. Mediastinum/Nodes: No enlarged mediastinal, hilar, or axillary lymph nodes. Multiple thyroid nodules. Trachea, and esophagus demonstrate no significant findings. Lungs/Pleura: Severe emphysema. There is a large, cavitary lung lesion centered in the superior segment right lower lobe with an internal air and fluid level, measuring approximately 8.4 x 6.6 x 6.6 cm (series 4, image 67, series 6, image 37). There is extensive heterogeneous airspace disease of the right lower and middle lobes as  well as a small right pleural effusion. Cavitation is new in comparison to prior examination  dated 09/12/2018 and adjacent airspace disease is generally worsened and more consolidative. Upper Abdomen: No acute abnormality. Musculoskeletal: No chest wall mass or suspicious bone lesions identified. IMPRESSION: 1. There is a large, cavitary lung lesion centered in the superior segment right lower lobe with an internal air and fluid level, measuring approximately 8.4 x 6.6 x 6.6 cm (series 4, image 67, series 6, image 37). There is extensive heterogeneous airspace disease of the right lower and middle lobes as well as a small right pleural effusion. Cavitation is new in comparison to prior examination dated 09/12/2018 and adjacent airspace disease is generally worsened and more consolidative. Findings are most consistent with worsening, cavitating pneumonia. 2.  Emphysema. 3.  Aortic atherosclerosis and coronary artery disease. Electronically Signed   By: Eddie Candle M.D.   On: 09/22/2018 16:49   Ct Chest High Resolution  Result Date: 09/12/2018 CLINICAL DATA:  COPD exacerbation, productive cough EXAM: CT CHEST WITHOUT CONTRAST TECHNIQUE: Multidetector CT imaging of the chest was performed following the standard protocol without intravenous contrast. High resolution imaging of the lungs, as well as inspiratory and expiratory imaging, was performed. COMPARISON:  05/15/2018 FINDINGS: Cardiovascular: Aortic atherosclerosis. Extensive 3 vessel coronary artery calcifications. Normal heart size. No pericardial effusion. Mediastinum/Nodes: No enlarged mediastinal, hilar, or axillary lymph nodes. Thyroid gland, trachea, and esophagus demonstrate no significant findings. Lungs/Pleura: Severe emphysema. There is extensive, somewhat masslike consolidation of the dependent right lung, centered about the superior segment right lower lobe although also involving the middle lobe (series 3, image 55). No pleural effusion or pneumothorax. Upper Abdomen: No acute abnormality. Musculoskeletal: No chest wall mass or  suspicious bone lesions identified. IMPRESSION: 1. There is extensive, somewhat masslike consolidation of the dependent right lung, centered about the superior segment right lower lobe although also involving the middle lobe (series 3, image 55). Findings likely reflect infection or aspiration, although underlying mass is not excluded. Recommend follow-up CT in 6-8 weeks to observe for resolution. 2.  Severe emphysema. 3.  Coronary artery disease and aortic atherosclerosis. Electronically Signed   By: Eddie Candle M.D.   On: 09/12/2018 15:46   Dg Chest Port 1 View  Result Date: 09/24/2018 CLINICAL DATA:  Respiratory failure.  COPD. EXAM: PORTABLE CHEST 1 VIEW COMPARISON:  September 22, 2018 FINDINGS: The cavitated infiltrate in the right mid and lateral lower lung is stable. No pneumothorax. Minimal atelectasis in the left base. The cardiomediastinal silhouette is stable. No other interval changes. IMPRESSION: The cavitated infiltrate in the right mid and lateral lower lung is stable. Minimal atelectasis in the left base. No other change. Electronically Signed   By: Dorise Bullion III M.D   On: 09/24/2018 08:53   Dg Chest Port 1 View  Result Date: 09/18/2018 CLINICAL DATA:  Shortness of breath, fever and pneumonia. EXAM: PORTABLE CHEST 1 VIEW COMPARISON:  September 16, 2018 FINDINGS: The cardiac silhouette is stably enlarged. Calcific atherosclerotic disease of the aorta. Upper lobe predominant emphysema. No significant change in patchy airspace consolidation in the right mid and lower lung. Minimal peribronchial airspace consolidation the left lower lung. Osseous structures are without acute abnormality. Soft tissues are grossly normal. IMPRESSION: 1. No significant change in the patchy airspace consolidation in the right mid and lower lung. 2. Minimal peribronchial airspace consolidation in the left lower lung. Electronically Signed   By: Fidela Salisbury M.D.   On: 09/18/2018 12:48   Dg  Chest Port 1  View  Result Date: 09/16/2018 CLINICAL DATA:  Shortness of breath EXAM: PORTABLE CHEST 1 VIEW COMPARISON:  September 12, 2018.  Chest x-ray 05/16/2018 FINDINGS: Consolidation in the right mid and lower lung compatible with pneumonia. Left lung clear. Heart is normal size. No effusions or acute bony abnormality. IMPRESSION: Dense consolidation in the right mid and lower lung as seen on prior CT compatible with pneumonia. Electronically Signed   By: Rolm Baptise M.D.   On: 09/16/2018 22:14   Dg Swallowing Func-speech Pathology  Result Date: 09/23/2018 Objective Swallowing Evaluation: Type of Study: MBS-Modified Barium Swallow Study  Patient Details Name: Ryan Coffey MRN: [id number] Date of Birth: [birth year] Today's Date: 09/23/2018 Time: SLP Start Time (ACUTE ONLY): 1411 -SLP Stop Time (ACUTE ONLY): 9892 SLP Time Calculation (min) (ACUTE ONLY): 23 min Past Medical History: Past Medical History: Diagnosis Date  Arthritis   Benign tumor of pituitary gland ([place])   "wasn't able to get it all"  COPD (chronic obstructive pulmonary disease) ([place])   Pneumonia 06/27/11  "first time"  Shingles  Past Surgical History: Past Surgical History: Procedure Laterality Date  CHOLECYSTECTOMY OPEN  2010  KIDNEY SURGERY  2011  "lesion removed right side"  RENAL BIOPSY  2012  right  TRANSPHENOIDAL / TRANSNASAL HYPOPHYSECTOMY / RESECTION PITUITARY TUMOR  ~ 1979  "they couldn't get it all" HPI: CAMARA ROSANDER is an 85 y.o. male who presents with weakness with RLL infiltrate on CXR. CT showed an extensive masslike consolidation of the dependent R lung. He was recently treated for thrush. He was admitted in March 2020 with PNA and daughter reports multiple cases of PNA so far this year. PMH inculdes: renal cell carcinoma in remission, COPD,  pituitary tumor,  hyperlipidemia, rheumatoid arthritis.  Subjective: pt denies any trouble swallowing Assessment / Plan / Recommendation CHL IP CLINICAL IMPRESSIONS 09/23/2018 Clinical Impression  Pt presents with a mild pharyngeal dysphagia characterized by impaired timing with thin liquids that leads to trace amounts of penetration during the swallow. Although this is not necessarily atypical for his age, his cough is weak and he is not able to eject penetrates despite volitional coughing. When he takes sequential boluses of thin liquids, there is also trace amounts of silent aspiration. The amount of liquid that enters his airway is very small, but there is certainly the potential for him to aspirate across the entirety of a meal, particularly if he is not fully upright or if his respiratory status is compromised. Airway protection is improved with nectar thick liquids, and it is also improved with thin liquids with use of a brief oral hold before swallowing. Mild vallecular residue is present across consistencies, increasing mildly with more solid boluses. Given his recurrent PNA that is also acutely worsening, recommend regular diet and nectar thick liquids for now. Potential may be good to advance back to thin liquids with use of strategies as he overcomes acute illness. Would also recommend EMST to maximize swallow and cough. SLP Visit Diagnosis Dysphagia, pharyngeal phase (R13.13) Attention and concentration deficit following -- Frontal lobe and executive function deficit following -- Impact on safety and function Mild aspiration risk   CHL IP TREATMENT RECOMMENDATION 09/23/2018 Treatment Recommendations Therapy as outlined in treatment plan below   Prognosis 09/23/2018 Prognosis for Safe Diet Advancement Good Barriers to Reach Goals -- Barriers/Prognosis Comment -- CHL IP DIET RECOMMENDATION 09/23/2018 SLP Diet Recommendations Regular solids;Nectar thick liquid Liquid Administration via Cup Medication Administration Whole meds with puree Compensations Slow  rate;Small sips/bites Postural Changes Seated upright at 90 degrees   CHL IP OTHER RECOMMENDATIONS 09/23/2018 Recommended Consults -- Oral Care  Recommendations Oral care BID Other Recommendations Order thickener from pharmacy;Prohibited food (jello, ice cream, thin soups);Remove water pitcher   CHL IP FOLLOW UP RECOMMENDATIONS 09/23/2018 Follow up Recommendations Home health SLP   CHL IP FREQUENCY AND DURATION 09/23/2018 Speech Therapy Frequency (ACUTE ONLY) min 2x/week Treatment Duration 2 weeks      CHL IP ORAL PHASE 09/23/2018 Oral Phase WFL Oral - Pudding Teaspoon -- Oral - Pudding Cup -- Oral - Honey Teaspoon -- Oral - Honey Cup -- Oral - Nectar Teaspoon -- Oral - Nectar Cup -- Oral - Nectar Straw -- Oral - Thin Teaspoon -- Oral - Thin Cup -- Oral - Thin Straw -- Oral - Puree -- Oral - Mech Soft -- Oral - Regular -- Oral - Multi-Consistency -- Oral - Pill -- Oral Phase - Comment --  CHL IP PHARYNGEAL PHASE 09/23/2018 Pharyngeal Phase Impaired Pharyngeal- Pudding Teaspoon -- Pharyngeal -- Pharyngeal- Pudding Cup -- Pharyngeal -- Pharyngeal- Honey Teaspoon -- Pharyngeal -- Pharyngeal- Honey Cup -- Pharyngeal -- Pharyngeal- Nectar Teaspoon -- Pharyngeal -- Pharyngeal- Nectar Cup Pharyngeal residue - valleculae Pharyngeal -- Pharyngeal- Nectar Straw -- Pharyngeal -- Pharyngeal- Thin Teaspoon -- Pharyngeal -- Pharyngeal- Thin Cup Pharyngeal residue - valleculae;Penetration/Aspiration during swallow Pharyngeal Material enters airway, remains ABOVE vocal cords and not ejected out Pharyngeal- Thin Straw Penetration/Aspiration during swallow;Pharyngeal residue - valleculae Pharyngeal Material enters airway, passes BELOW cords without attempt by patient to eject out (silent aspiration) Pharyngeal- Puree Pharyngeal residue - valleculae Pharyngeal -- Pharyngeal- Mechanical Soft Pharyngeal residue - valleculae Pharyngeal -- Pharyngeal- Regular -- Pharyngeal -- Pharyngeal- Multi-consistency -- Pharyngeal -- Pharyngeal- Pill WFL Pharyngeal -- Pharyngeal Comment --  CHL IP CERVICAL ESOPHAGEAL PHASE 09/23/2018 Cervical Esophageal Phase WFL Pudding Teaspoon -- Pudding Cup --  Honey Teaspoon -- Honey Cup -- Nectar Teaspoon -- Nectar Cup -- Nectar Straw -- Thin Teaspoon -- Thin Cup -- Thin Straw -- Puree -- Mechanical Soft -- Regular -- Multi-consistency -- Pill -- Cervical Esophageal Comment -- Venita Sheffield Nix 09/23/2018, 3:45 PM  Pollyann Glen, M.A. CCC-SLP Acute Rehabilitation Services Pager [phone] Office [phone]                Specialty Problems      Pulmonary Problems   HCAP (healthcare-associated pneumonia)   COPD (chronic obstructive pulmonary disease) ([place])    Per Chest Xray reports from 2013 and 2014       Chronic cough   Other emphysema ([place])    09/12/2018-CT chest high-res- extensive somewhat masslike consolidation on dependent right lung centered around superior segment right lower lobe although also involving the middle lobe, findings likely reflect infection or aspiration although underlying mass is not excluded recommend follow-up CT in 6 to 8 weeks to observe resolution, severe emphysema, CAD  09/22/2018-CT chest without contrast- there is a large cavitary lung lesion centered in the superior segment right lower lobe with an internal air and fluid level measuring 8.4 x 6.6 x 6.6 cm there is extensive heterogeneous airspace disease of the right lower lobe and middle lobe as well as small right pleural effusion, cavitation is a new in comparison to prior exam on 09/12/2018, findings are most consistent with worsening cavitating pneumonia, emphysema, aortic arthrosclerosis      HAP (hospital-acquired pneumonia)   Chronic respiratory failure with hypoxia ([place])   Dyspnea on exertion   Pneumonia of right lower lobe due to infectious  organism [facility])    09/12/2018-CT chest high-res- extensive somewhat masslike consolidation on dependent right lung centered around superior segment right lower lobe although also involving the middle lobe, findings likely reflect infection or aspiration although underlying mass is not excluded recommend follow-up CT in 6 to 8  weeks to observe resolution, severe emphysema, CAD  09/22/2018-CT chest without contrast- there is a large cavitary lung lesion centered in the superior segment right lower lobe with an internal air and fluid level measuring 8.4 x 6.6 x 6.6 cm there is extensive heterogeneous airspace disease of the right lower lobe and middle lobe as well as small right pleural effusion, cavitation is a new in comparison to prior exam on 09/12/2018, findings are most consistent with worsening cavitating pneumonia, emphysema, aortic arthrosclerosis         No Known Allergies  Immunization History  Administered Date(s) Administered   Influenza-Unspecified 11/28/2017   Pneumococcal-Unspecified 06/04/2006    Past Medical History:  Diagnosis Date   Arthritis    Benign tumor of pituitary gland ([place])    "wasn't able to get it all"   COPD (chronic obstructive pulmonary disease) ([place])    Pneumonia 06/27/11   "first time"   Shingles     Tobacco History: Social History   Tobacco Use  Smoking Status Former Smoker   Packs/day: 0.50   Years: 10.00   Pack years: 5.00   Types: Cigarettes   Quit date: 08/03/1980   Years since quitting: 38.2  Smokeless Tobacco Never Used   Counseling given: Not Answered  Continue to not smoke  Outpatient Encounter Medications as of 10/07/2018  Medication Sig   acetaminophen (TYLENOL) 325 MG tablet Take 650 mg by mouth 2 (two) times daily as needed (arthritis).   albuterol (PROVENTIL) (2.5 MG/3ML) 0.083% nebulizer solution Take 3 mLs (2.5 mg total) by nebulization every 6 (six) hours as needed for wheezing or shortness of breath. Dx J44.1   bromocriptine (PARLODEL) 2.5 MG tablet Take 2.5 mg by mouth 2 (two) times daily.   Calcium Carbonate-Vitamin D (CALCIUM 500 + D) 500-125 MG-UNIT TABS Take 1 tablet by mouth 2 (two) times daily.   clindamycin (CLEOCIN) 300 MG capsule Take 1 capsule (300 mg total) by mouth 3 (three) times daily. X 6 weeks   folic acid  (FOLVITE) 1 MG tablet TAKE 2 TABLETS EVERY DAY (Patient taking differently: Take 2 mg by mouth daily. )   guaiFENesin-dextromethorphan (ROBITUSSIN DM) 100-10 MG/5ML syrup Take 10 mLs by mouth every 4 (four) hours as needed for cough.   hydrocortisone (CORTEF) 20 MG tablet Take 10-20 mg by mouth daily. 20 mg in the morning and 10mg  at night   levothyroxine (SYNTHROID, LEVOTHROID) 137 MCG tablet Take 137 mcg by mouth daily.     Maltodextrin-Xanthan Gum (RESOURCE THICKENUP CLEAR) POWD Take 15,000 g by mouth as needed.   methotrexate (RHEUMATREX) 2.5 MG tablet TAKE 4 tablets on Saturday and 4 tablets on Sunday. Caution:Chemotherapy. Protect from light.   pantoprazole (PROTONIX) 40 MG tablet Take 1 tablet (40 mg total) by mouth daily.   pravastatin (PRAVACHOL) 80 MG tablet Take 80 mg by mouth daily.   testosterone cypionate (DEPOTESTOSTERONE CYPIONATE) 200 MG/ML injection Inject 75 mg into the muscle every 14 (fourteen) days.    zolpidem (AMBIEN CR) 6.25 MG CR tablet Take 1 tablet (6.25 mg total) by mouth at bedtime as needed for sleep.   budesonide-formoterol (SYMBICORT) 80-4.5 MCG/ACT inhaler Inhale 2 puffs into the lungs 2 (two) times a day. (Patient not  taking: Reported on 10/07/2018)   umeclidinium-vilanterol (ANORO ELLIPTA) 62.5-25 MCG/INH AEPB Inhale 1 puff into the lungs daily.   umeclidinium-vilanterol (ANORO ELLIPTA) 62.5-25 MCG/INH AEPB Inhale 1 puff into the lungs daily.   [DISCONTINUED] umeclidinium-vilanterol (ANORO ELLIPTA) 62.5-25 MCG/INH AEPB Inhale 1 puff into the lungs daily. (Patient not taking: Reported on 10/07/2018)   No facility-administered encounter medications on file as of 10/07/2018.     Review of Systems  Review of Systems  Constitutional: Positive for activity change, fatigue and fever (Sporadic elevated temps, T-max was 102 last week, T-max this week is 99, patient takes 650 mg of Tylenol daily for arthritis). Negative for appetite change (Eating well), chills  and unexpected weight change.  HENT: Positive for congestion and trouble swallowing (Mild aspiration risk, patient not using thickener as recommended at hospital discharge). Negative for postnasal drip, rhinorrhea, sinus pressure, sinus pain and sore throat.   Eyes: Negative.   Respiratory: Positive for cough (Productive cough with clear to yellow sputum) and shortness of breath. Negative for wheezing.   Cardiovascular: Negative for chest pain and palpitations.  Gastrointestinal: Negative for diarrhea, nausea and vomiting.  Endocrine: Negative.   Genitourinary: Negative.   Musculoskeletal: Negative.   Skin: Negative.   Neurological: Negative for dizziness and headaches.  Psychiatric/Behavioral: Negative.  Negative for dysphoric mood. The patient is not nervous/anxious.   All other systems reviewed and are negative.    Physical Exam  BP 118/72 (BP Location: Left Arm, Cuff Size: Normal)    Pulse 93    Temp 97.8 F (36.6 C) (Oral)    Ht 5\' 8"  (1.727 m)    Wt 143 lb 12.8 oz (65.2 kg)    SpO2 100%    BMI 21.86 kg/m   Wt Readings from Last 5 Encounters:  10/07/18 143 lb 12.8 oz (65.2 kg)  09/25/18 149 lb 9.6 oz (67.9 kg)  09/16/18 160 lb (72.6 kg)  08/29/18 152 lb 12.8 oz (69.3 kg)  07/30/18 152 lb (68.9 kg)    Physical Exam Vitals signs and nursing note reviewed.  Constitutional:      General: He is awake. He is not in acute distress.    Appearance: Normal appearance.     Interventions: Nasal cannula in place.     Comments: Chronically ill elderly male, 4 L O2  HENT:     Head: Normocephalic and atraumatic.     Right Ear: Hearing, tympanic membrane, ear canal and external ear normal.     Left Ear: Hearing, tympanic membrane, ear canal and external ear normal.     Nose: Nose normal. No mucosal edema or rhinorrhea.     Right Turbinates: Not enlarged.     Left Turbinates: Not enlarged.     Mouth/Throat:     Mouth: Mucous membranes are dry.     Pharynx: Oropharynx is clear. No  oropharyngeal exudate.  Eyes:     Pupils: Pupils are equal, round, and reactive to light.  Neck:     Musculoskeletal: Normal range of motion.  Cardiovascular:     Rate and Rhythm: Normal rate and regular rhythm.     Pulses: Normal pulses.     Heart sounds: Normal heart sounds. No murmur.  Pulmonary:     Effort: Pulmonary effort is normal.     Breath sounds: Rhonchi (Right lower lobe) present. No decreased breath sounds, wheezing or rales.     Comments: Noisy chest, scattered rhonchi that partially clear with cough Abdominal:     General: Bowel sounds are  normal. There is no distension.     Palpations: Abdomen is soft.     Tenderness: There is no abdominal tenderness.  Musculoskeletal:     Right lower leg: Edema (2-3+ lower extremity) present.     Left lower leg: No edema (2-3+ lower extremity).  Lymphadenopathy:     Cervical: No cervical adenopathy.  Skin:    General: Skin is warm and dry.     Capillary Refill: Capillary refill takes less than 2 seconds.     Findings: No erythema or rash.  Neurological:     General: No focal deficit present.     Mental Status: He is alert and oriented to person, place, and time.     Motor: No weakness.     Coordination: Coordination normal.     Gait: Gait is intact. Gait (tolerated walk in office today ) normal.  Psychiatric:        Mood and Affect: Mood normal.        Behavior: Behavior normal. Behavior is cooperative.        Thought Content: Thought content normal.        Judgment: Judgment normal.      Lab Results:  CBC    Component Value Date/Time   WBC 9.5 09/24/2018 0409   RBC 3.08 (L) 09/24/2018 0409   HGB 9.3 (L) 09/24/2018 0409   HCT 29.4 (L) 09/24/2018 0409   PLT 553 (H) 09/24/2018 0409   MCV 95.5 09/24/2018 0409   MCH 30.2 09/24/2018 0409   MCHC 31.6 09/24/2018 0409   RDW 15.8 (H) 09/24/2018 0409   LYMPHSABS 0.8 09/24/2018 0409   MONOABS 0.3 09/24/2018 0409   EOSABS 0.1 09/24/2018 0409   BASOSABS 0.0 09/24/2018  0409    BMET    Component Value Date/Time   NA 134 (L) 09/24/2018 0409   K 3.1 (L) 09/24/2018 0409   CL 88 (L) 09/24/2018 0409   CO2 37 (H) 09/24/2018 0409   GLUCOSE 102 (H) 09/24/2018 0409   BUN 15 09/24/2018 0409   CREATININE 0.69 09/25/2018 0514   CREATININE 0.84 07/30/2018 1126   CALCIUM 8.2 (L) 09/24/2018 0409   GFRNONAA >60 09/25/2018 0514   GFRNONAA 81 07/30/2018 1126   GFRAA >60 09/25/2018 0514   GFRAA 94 07/30/2018 1126    BNP    Component Value Date/Time   BNP 368.4 (H) 09/16/2018 2144    ProBNP    Component Value Date/Time   PROBNP 281.8 05/23/2011 1135      Assessment & Plan:   Other emphysema ([place]) Previously managed on trelegy but feels that inhaled corticosteroid led to pneumonia Needs pulmonary function testing Severe emphysema on CT imaging in July/2020 Walk in office patient required 4 L of O2 with exertion  Plan: Start Anoro Ellipta, samples provided today Schedule pulmonary function testing in 4 to 6 weeks Okay to use albuterol nebulized meds every 6 hours as needed for shortness of breath and wheezing Okay to increase physical activity as tolerated   Pneumonia of right lower lobe due to infectious organism (HCC) Finish clindamycin Mild aspiration risk, patient not using thickener liquid Intermittent fevers but mainly afebrile  Plan: Sputum cultures Continue oxygen therapy as prescribed Likely will need to repeat CT imaging 6 to 8 weeks out from 09/22/2018 Continue pulmonary toilet, use incentive spirometer 2-3 times daily if not more, use flutter valve at least twice daily 10 breaths each time Follow aspiration guidelines from the hospital If you do not use thickener with your fluid  to likely are increasing your chances of having aspiration pneumonia Contact our office if fevers develop, diet worsens, appetite decreases, symptoms worsen   Seropositive rheumatoid arthritis ([place]) Plan: Continue follow-up with rheumatology Continue  methotrexate at this time   Abnormal findings on diagnostic imaging of lung Plan: We will repeat CT imaging in September/2022 follow-up from 09/22/2018 CT chest without contrast that showed large cavitary lung lesion  Dyspnea on exertion 2-3+ lower extremity edema on exam today Ambulated in office requiring 4 L of O2 Not managed on diuretics Echocardiogram completed in July/2020 indicates potentially elevated right ventricle pressures  Plan: Lab work today Chest x-ray today Patient may need to have trial of diuretics Patient to go home and elevate feet as well as to wear tight fitting socks or compression stockings  Chronic respiratory failure with hypoxia ([place]) Plan: Continue oxygen therapy as prescribed May need to consider overnight oximetry in the future if patient continues to require oxygen despite improving from hospitalization    Return in about 6 weeks (around 11/18/2018), or if symptoms worsen or fail to improve, for Follow up with Wyn Quaker FNP-C, Follow up for PFT, Follow up with Dr. Vaughan Browner.   Lauraine Rinne, NP 10/07/2018   This appointment was 48 minutes long with over 50% of the time in direct face-to-face patient care, assessment, plan of care, and follow-up.

## 2018-10-07 ENCOUNTER — Encounter: Payer: Self-pay | Admitting: Pulmonary Disease

## 2018-10-07 ENCOUNTER — Ambulatory Visit (INDEPENDENT_AMBULATORY_CARE_PROVIDER_SITE_OTHER): Payer: Medicare Other

## 2018-10-07 ENCOUNTER — Other Ambulatory Visit: Payer: Self-pay

## 2018-10-07 ENCOUNTER — Telehealth: Payer: Self-pay | Admitting: *Deleted

## 2018-10-07 ENCOUNTER — Ambulatory Visit (INDEPENDENT_AMBULATORY_CARE_PROVIDER_SITE_OTHER): Payer: Medicare Other | Admitting: Pulmonary Disease

## 2018-10-07 VITALS — BP 118/72 | HR 93 | Temp 97.8°F | Ht 68.0 in | Wt 143.8 lb

## 2018-10-07 DIAGNOSIS — J449 Chronic obstructive pulmonary disease, unspecified: Secondary | ICD-10-CM

## 2018-10-07 DIAGNOSIS — J440 Chronic obstructive pulmonary disease with acute lower respiratory infection: Secondary | ICD-10-CM | POA: Diagnosis not present

## 2018-10-07 DIAGNOSIS — J181 Lobar pneumonia, unspecified organism: Secondary | ICD-10-CM

## 2018-10-07 DIAGNOSIS — R0609 Other forms of dyspnea: Secondary | ICD-10-CM

## 2018-10-07 DIAGNOSIS — J438 Other emphysema: Secondary | ICD-10-CM

## 2018-10-07 DIAGNOSIS — M1990 Unspecified osteoarthritis, unspecified site: Secondary | ICD-10-CM | POA: Diagnosis not present

## 2018-10-07 DIAGNOSIS — R918 Other nonspecific abnormal finding of lung field: Secondary | ICD-10-CM | POA: Diagnosis not present

## 2018-10-07 DIAGNOSIS — J189 Pneumonia, unspecified organism: Secondary | ICD-10-CM

## 2018-10-07 DIAGNOSIS — J9611 Chronic respiratory failure with hypoxia: Secondary | ICD-10-CM | POA: Diagnosis not present

## 2018-10-07 DIAGNOSIS — E039 Hypothyroidism, unspecified: Secondary | ICD-10-CM | POA: Diagnosis not present

## 2018-10-07 DIAGNOSIS — M059 Rheumatoid arthritis with rheumatoid factor, unspecified: Secondary | ICD-10-CM

## 2018-10-07 DIAGNOSIS — I5030 Unspecified diastolic (congestive) heart failure: Secondary | ICD-10-CM | POA: Diagnosis not present

## 2018-10-07 LAB — COMPREHENSIVE METABOLIC PANEL
ALT: 18 U/L (ref 0–53)
AST: 21 U/L (ref 0–37)
Albumin: 3.1 g/dL — ABNORMAL LOW (ref 3.5–5.2)
Alkaline Phosphatase: 88 U/L (ref 39–117)
BUN: 17 mg/dL (ref 6–23)
CO2: 36 mEq/L — ABNORMAL HIGH (ref 19–32)
Calcium: 9.4 mg/dL (ref 8.4–10.5)
Chloride: 97 mEq/L (ref 96–112)
Creatinine, Ser: 0.66 mg/dL (ref 0.40–1.50)
GFR: 115.03 mL/min (ref >60.00)
Glucose, Bld: 86 mg/dL (ref 70–99)
Potassium: 4.7 mEq/L (ref 3.5–5.1)
Sodium: 137 mEq/L (ref 135–145)
Total Bilirubin: 0.5 mg/dL (ref 0.2–1.2)
Total Protein: 6.7 g/dL (ref 6.0–8.3)

## 2018-10-07 LAB — PRO B NATRIURETIC PEPTIDE: NT-Pro BNP: 973 pg/mL — ABNORMAL HIGH (ref 0–486)

## 2018-10-07 MED ORDER — FUROSEMIDE 20 MG PO TABS: 20.0000 mg | ORAL_TABLET | Freq: Every day | ORAL | 0 refills | Status: DC

## 2018-10-07 MED ORDER — ANORO ELLIPTA 62.5-25 MCG/INH IN AEPB: 1.0000 | INHALATION_SPRAY | Freq: Every day | RESPIRATORY_TRACT | 3 refills | Status: DC

## 2018-10-07 MED ORDER — ANORO ELLIPTA 62.5-25 MCG/INH IN AEPB: 1.0000 | INHALATION_SPRAY | Freq: Every day | RESPIRATORY_TRACT | 0 refills | Status: DC

## 2018-10-07 NOTE — Assessment & Plan Note (Signed)
Plan: Continue follow-up with rheumatology Continue methotrexate at this time

## 2018-10-07 NOTE — Progress Notes (Signed)
Please contact the patient let him know the proBNP is elevated.  We will need to do a trial of Lasix to help with fluid retention.  Please have the patient take Lasix 20 mg daily for the next 5 days. >>> Please place the order >>> Please inform patient to take this in the morning  Elevate legs as able Also Wear compression or tight fitting socks to help with lower extremity swelling  We will need to have repeat blood work done with either primary care or at our office towards the end of next week to monitor kidney functioning as well as potassium levels.  Potassium on blood work today is stable.  Albumin is slightly low.  Please remind the patient to increase protein in diet as able.  Patient could start using Ensure or boost drinks (with prescribed thickener) in between meals to help with increasing daily protein intake.  Wyn Quaker, FNP

## 2018-10-07 NOTE — Assessment & Plan Note (Signed)
Finish clindamycin Mild aspiration risk, patient not using thickener liquid Intermittent fevers but mainly afebrile  Plan: Sputum cultures Continue oxygen therapy as prescribed Likely will need to repeat CT imaging 6 to 8 weeks out from 09/22/2018 Continue pulmonary toilet, use incentive spirometer 2-3 times daily if not more, use flutter valve at least twice daily 10 breaths each time Follow aspiration guidelines from the hospital If you do not use thickener with your fluid to likely are increasing your chances of having aspiration pneumonia Contact our office if fevers develop, diet worsens, appetite decreases, symptoms worsen

## 2018-10-07 NOTE — Telephone Encounter (Signed)
Yes we have already called the patient regarding these results and implemented treatment.  Wyn Quaker, FNP

## 2018-10-07 NOTE — Assessment & Plan Note (Signed)
2-3+ lower extremity edema on exam today Ambulated in office requiring 4 L of O2 Not managed on diuretics Echocardiogram completed in July/2020 indicates potentially elevated right ventricle pressures  Plan: Lab work today Chest x-ray today Patient may need to have trial of diuretics Patient to go home and elevate feet as well as to wear tight fitting socks or compression stockings

## 2018-10-07 NOTE — Progress Notes (Signed)
Please notify patient that we have received chest x-ray results.  Showing improved right lung aeration consistent with a resolving pneumonia.  This is good news.  We will still plan on doing a repeat CT chest without contrast in middle of September/2020 to further monitor and follow-up.  This order has already been placed.  Wyn Quaker, FNP

## 2018-10-07 NOTE — Assessment & Plan Note (Signed)
Plan: Continue oxygen therapy as prescribed May need to consider overnight oximetry in the future if patient continues to require oxygen despite improving from hospitalization

## 2018-10-07 NOTE — Assessment & Plan Note (Signed)
Plan: We will repeat CT imaging in September/2022 follow-up from 09/22/2018 CT chest without contrast that showed large cavitary lung lesion

## 2018-10-07 NOTE — Assessment & Plan Note (Signed)
Previously managed on trelegy but feels that inhaled corticosteroid led to pneumonia Needs pulmonary function testing Severe emphysema on CT imaging in July/2020 Walk in office patient required 4 L of O2 with exertion  Plan: Start Anoro Ellipta, samples provided today Schedule pulmonary function testing in 4 to 6 weeks Okay to use albuterol nebulized meds every 6 hours as needed for shortness of breath and wheezing Okay to increase physical activity as tolerated

## 2018-10-07 NOTE — Patient Instructions (Addendum)
Walk today  >>>required 4L o2 with exertion   Chest Xray today   Labwork today   Anoro Ellipta  >>> Take 1 puff daily in the morning right when you wake up >>>Rinse your mouth out after use >>>This is a daily maintenance inhaler, NOT a rescue inhaler >>>Contact our office if you are having difficulties affording or obtaining this medication >>>It is important for you to be able to take this daily and not miss any doses   Albuterol nebulizer every 6 hours as needed for shortness of breath or wheezing   Continue oxygen therapy as prescribed  >>>maintain oxygen saturations greater than 88 percent  >>>if unable to maintain oxygen saturations please contact the office  >>>do not smoke with oxygen  >>>can use nasal saline gel or nasal saline rinses to moisturize nose if oxygen causes dryness  Continue PT for now   Use a flutter valve 10 breaths twice a day or 4 to 5 breaths 4-5 times a day to help clear mucus out  Use your incentive spirometer every 6-8 hours   We will give you a sputum cup today for you to cough sputum into if it starts changing color so that way we can culture the sputum  Pulmonary Function test in 6 weeks   Note your daily symptoms > remember "red flags" for COPD:   >>>Increase in cough >>>increase in sputum production >>>increase in shortness of breath or activity  intolerance.   If you notice these symptoms, please call the office to be seen.    Continue follow up with Rheumatology    mild aspiration risk- oral care twice daily, order thickener from pharmacy, prohibited foods Jell-O, ice cream, thin soups   Return in about 6 weeks (around 11/18/2018), or if symptoms worsen or fail to improve, for Follow up with Wyn Quaker FNP-C, Follow up for PFT, Follow up with Dr. Vaughan Browner.   Coronavirus (COVID-19) Are you at risk?  Are you at risk for the Coronavirus (COVID-19)?  To be considered HIGH RISK for Coronavirus (COVID-19), you have to meet the following  criteria:  . Traveled to [place], [place], [place], [place] or [place]; or in the [place] to [place], [place], [place], or [place]; and have fever, cough, and shortness of breath within the last 2 weeks of travel OR . Been in close contact with a person diagnosed with COVID-19 within the last 2 weeks and have fever, cough, and shortness of breath . IF YOU DO NOT MEET THESE CRITERIA, YOU ARE CONSIDERED LOW RISK FOR COVID-19.  What to do if you are HIGH RISK for COVID-19?  Marland Kitchen If you are having a medical emergency, call 911. . Seek medical care right away. Before you go to a doctor's office, urgent care or emergency department, call ahead and tell them about your recent travel, contact with someone diagnosed with COVID-19, and your symptoms. You should receive instructions from your physician's office regarding next steps of care.  . When you arrive at healthcare provider, tell the healthcare staff immediately you have returned from visiting [place], [place], [place], [place] or [place]; or traveled in the [place] to [place], [place], [place], or [place]; in the last two weeks or you have been in close contact with a person diagnosed with COVID-19 in the last 2 weeks.   . Tell the health care staff about your symptoms: fever, cough and shortness of breath. . After you have been seen by a medical provider,  you will be either: o Tested for (COVID-19) and discharged home on quarantine except to seek medical care if symptoms worsen, and asked to  - Stay home and avoid contact with others until you get your results (4-5 days)  - Avoid travel on public transportation if possible (such as bus, train, or airplane) or o Sent to the Emergency Department by EMS for evaluation, COVID-19 testing, and possible admission depending on your condition and test results.  What to do if you are LOW RISK for COVID-19?  Reduce your risk of any infection by using the same precautions used for  avoiding the common cold or flu:  Marland Kitchen Wash your hands often with soap and warm water for at least 20 seconds.  If soap and water are not readily available, use an alcohol-based hand sanitizer with at least 60% alcohol.  . If coughing or sneezing, cover your mouth and nose by coughing or sneezing into the elbow areas of your shirt or coat, into a tissue or into your sleeve (not your hands). . Avoid shaking hands with others and consider head nods or verbal greetings only. . Avoid touching your eyes, nose, or mouth with unwashed hands.  . Avoid close contact with people who are sick. . Avoid places or events with large numbers of people in one location, like concerts or sporting events. . Carefully consider travel plans you have or are making. . If you are planning any travel outside or inside the [name], visit the CDC's Travelers' Health webpage for the latest health notices. . If you have some symptoms but not all symptoms, continue to monitor at home and seek medical attention if your symptoms worsen. . If you are having a medical emergency, call 911.   [place] / e-Visit: [email or web address]         [facility]: [facility]: [phone]                   [facility]: [phone]           It is flu season:   >>> Best ways to protect herself from the flu: Receive the yearly flu vaccine, practice good hand hygiene washing with soap and also using hand sanitizer when available, eat a nutritious meals, get adequate rest, hydrate appropriately   Please contact the office if your symptoms worsen or you have concerns that you are not improving.   Thank you for choosing [facility] for your healthcare, and for allowing [name] to partner with you on your healthcare journey. I am thankful to be able to provide care to you today.    Wyn Quaker FNP-C

## 2018-10-07 NOTE — Telephone Encounter (Signed)
Received call report from [facility] with [employer] on patient's ProBNP done on 10/07/18. Aaron Edelman please review the result/impression copied below:  Status:  Final result  Visible to patient:  No (not released)  Next appt:  12/30/2018 at 10:15 AM in Rheumatology Bo Merino, MD)  Dx:  Dyspnea on exertion  Ref Range & Units 11:10  NT-Pro BNP 0 - 486 pg/mL 973High    Comment: The following cut-points have been suggested for the  use of proBNP for the diagnostic evaluation of heart  failure (HF) in patients with acute dyspnea:  Modality           Age      Optimal Cut               (years)      Point  ------------------------------------------------------  Diagnosis (rule in HF)    <50      450 pg/mL               50 - 75      900 pg/mL                 >75      1800 pg/mL  Exclusion (rule out HF) Age independent   300 pg/mL   Resulting Agency  [facility]    Narrative Performed by: Maryan Puls Performed at: [facility] - [facility]  [address] [place], [place] [id number]  Lab Director: Rush Farmer MD, Phone: [phone]    Specimen Collected: 10/07/18 11:10 Last Resulted: 10/07/18 13:35        Brian Please advise, thank you.

## 2018-10-09 DIAGNOSIS — M059 Rheumatoid arthritis with rheumatoid factor, unspecified: Secondary | ICD-10-CM | POA: Diagnosis not present

## 2018-10-09 DIAGNOSIS — E039 Hypothyroidism, unspecified: Secondary | ICD-10-CM | POA: Diagnosis not present

## 2018-10-09 DIAGNOSIS — M1990 Unspecified osteoarthritis, unspecified site: Secondary | ICD-10-CM | POA: Diagnosis not present

## 2018-10-09 DIAGNOSIS — J189 Pneumonia, unspecified organism: Secondary | ICD-10-CM | POA: Diagnosis not present

## 2018-10-09 DIAGNOSIS — J440 Chronic obstructive pulmonary disease with acute lower respiratory infection: Secondary | ICD-10-CM | POA: Diagnosis not present

## 2018-10-09 DIAGNOSIS — I5030 Unspecified diastolic (congestive) heart failure: Secondary | ICD-10-CM | POA: Diagnosis not present

## 2018-10-14 DIAGNOSIS — J189 Pneumonia, unspecified organism: Secondary | ICD-10-CM | POA: Diagnosis not present

## 2018-10-14 DIAGNOSIS — M059 Rheumatoid arthritis with rheumatoid factor, unspecified: Secondary | ICD-10-CM | POA: Diagnosis not present

## 2018-10-14 DIAGNOSIS — E039 Hypothyroidism, unspecified: Secondary | ICD-10-CM | POA: Diagnosis not present

## 2018-10-14 DIAGNOSIS — I5030 Unspecified diastolic (congestive) heart failure: Secondary | ICD-10-CM | POA: Diagnosis not present

## 2018-10-14 DIAGNOSIS — M1990 Unspecified osteoarthritis, unspecified site: Secondary | ICD-10-CM | POA: Diagnosis not present

## 2018-10-14 DIAGNOSIS — J440 Chronic obstructive pulmonary disease with acute lower respiratory infection: Secondary | ICD-10-CM | POA: Diagnosis not present

## 2018-10-15 DIAGNOSIS — E291 Testicular hypofunction: Secondary | ICD-10-CM | POA: Diagnosis not present

## 2018-10-16 DIAGNOSIS — J189 Pneumonia, unspecified organism: Secondary | ICD-10-CM | POA: Diagnosis not present

## 2018-10-16 DIAGNOSIS — M059 Rheumatoid arthritis with rheumatoid factor, unspecified: Secondary | ICD-10-CM | POA: Diagnosis not present

## 2018-10-16 DIAGNOSIS — E039 Hypothyroidism, unspecified: Secondary | ICD-10-CM | POA: Diagnosis not present

## 2018-10-16 DIAGNOSIS — M1990 Unspecified osteoarthritis, unspecified site: Secondary | ICD-10-CM | POA: Diagnosis not present

## 2018-10-16 DIAGNOSIS — J440 Chronic obstructive pulmonary disease with acute lower respiratory infection: Secondary | ICD-10-CM | POA: Diagnosis not present

## 2018-10-16 DIAGNOSIS — I5030 Unspecified diastolic (congestive) heart failure: Secondary | ICD-10-CM | POA: Diagnosis not present

## 2018-10-20 DIAGNOSIS — M1990 Unspecified osteoarthritis, unspecified site: Secondary | ICD-10-CM | POA: Diagnosis not present

## 2018-10-20 DIAGNOSIS — M059 Rheumatoid arthritis with rheumatoid factor, unspecified: Secondary | ICD-10-CM | POA: Diagnosis not present

## 2018-10-20 DIAGNOSIS — J440 Chronic obstructive pulmonary disease with acute lower respiratory infection: Secondary | ICD-10-CM | POA: Diagnosis not present

## 2018-10-20 DIAGNOSIS — J189 Pneumonia, unspecified organism: Secondary | ICD-10-CM | POA: Diagnosis not present

## 2018-10-20 DIAGNOSIS — I5030 Unspecified diastolic (congestive) heart failure: Secondary | ICD-10-CM | POA: Diagnosis not present

## 2018-10-20 DIAGNOSIS — E039 Hypothyroidism, unspecified: Secondary | ICD-10-CM | POA: Diagnosis not present

## 2018-10-23 DIAGNOSIS — J440 Chronic obstructive pulmonary disease with acute lower respiratory infection: Secondary | ICD-10-CM | POA: Diagnosis not present

## 2018-10-23 DIAGNOSIS — I5030 Unspecified diastolic (congestive) heart failure: Secondary | ICD-10-CM | POA: Diagnosis not present

## 2018-10-23 DIAGNOSIS — M1990 Unspecified osteoarthritis, unspecified site: Secondary | ICD-10-CM | POA: Diagnosis not present

## 2018-10-23 DIAGNOSIS — J189 Pneumonia, unspecified organism: Secondary | ICD-10-CM | POA: Diagnosis not present

## 2018-10-23 DIAGNOSIS — M059 Rheumatoid arthritis with rheumatoid factor, unspecified: Secondary | ICD-10-CM | POA: Diagnosis not present

## 2018-10-23 DIAGNOSIS — E039 Hypothyroidism, unspecified: Secondary | ICD-10-CM | POA: Diagnosis not present

## 2018-10-29 DIAGNOSIS — E039 Hypothyroidism, unspecified: Secondary | ICD-10-CM | POA: Diagnosis not present

## 2018-10-29 DIAGNOSIS — M059 Rheumatoid arthritis with rheumatoid factor, unspecified: Secondary | ICD-10-CM | POA: Diagnosis not present

## 2018-10-29 DIAGNOSIS — J440 Chronic obstructive pulmonary disease with acute lower respiratory infection: Secondary | ICD-10-CM | POA: Diagnosis not present

## 2018-10-29 DIAGNOSIS — I5030 Unspecified diastolic (congestive) heart failure: Secondary | ICD-10-CM | POA: Diagnosis not present

## 2018-10-29 DIAGNOSIS — E291 Testicular hypofunction: Secondary | ICD-10-CM | POA: Diagnosis not present

## 2018-10-29 DIAGNOSIS — M1990 Unspecified osteoarthritis, unspecified site: Secondary | ICD-10-CM | POA: Diagnosis not present

## 2018-10-29 DIAGNOSIS — J189 Pneumonia, unspecified organism: Secondary | ICD-10-CM | POA: Diagnosis not present

## 2018-11-17 ENCOUNTER — Other Ambulatory Visit: Payer: Self-pay

## 2018-11-17 ENCOUNTER — Ambulatory Visit (INDEPENDENT_AMBULATORY_CARE_PROVIDER_SITE_OTHER)
Admission: RE | Admit: 2018-11-17 | Discharge: 2018-11-17 | Disposition: A | Discharge status: Home / Self Care | Payer: Medicare Other | Source: Ambulatory Visit | Attending: Pulmonary Disease | Admitting: Pulmonary Disease

## 2018-11-17 DIAGNOSIS — J181 Lobar pneumonia, unspecified organism: Secondary | ICD-10-CM

## 2018-11-17 DIAGNOSIS — J438 Other emphysema: Secondary | ICD-10-CM | POA: Diagnosis not present

## 2018-11-17 DIAGNOSIS — J432 Centrilobular emphysema: Secondary | ICD-10-CM | POA: Diagnosis not present

## 2018-11-17 DIAGNOSIS — R918 Other nonspecific abnormal finding of lung field: Secondary | ICD-10-CM

## 2018-11-17 DIAGNOSIS — J189 Pneumonia, unspecified organism: Secondary | ICD-10-CM | POA: Diagnosis not present

## 2018-11-18 DIAGNOSIS — E291 Testicular hypofunction: Secondary | ICD-10-CM | POA: Diagnosis not present

## 2018-11-18 NOTE — Progress Notes (Signed)
CT results have come back.  Decreased size of cavitary lesion.  Still showing areas of bronchiectasis as well as potentially persistent opacity.  Please get patient scheduled for a follow-up with Dr. Vaughan Browner to further review the CT results in person.  Wyn Quaker FNP

## 2018-11-25 DIAGNOSIS — E291 Testicular hypofunction: Secondary | ICD-10-CM | POA: Diagnosis not present

## 2018-11-28 ENCOUNTER — Other Ambulatory Visit: Payer: Self-pay

## 2018-11-28 ENCOUNTER — Ambulatory Visit (INDEPENDENT_AMBULATORY_CARE_PROVIDER_SITE_OTHER): Payer: Medicare Other | Admitting: Pulmonary Disease

## 2018-11-28 ENCOUNTER — Encounter: Payer: Self-pay | Admitting: Pulmonary Disease

## 2018-11-28 VITALS — BP 114/60 | HR 90 | Temp 97.0°F | Ht 68.0 in | Wt 146.0 lb

## 2018-11-28 DIAGNOSIS — Z23 Encounter for immunization: Secondary | ICD-10-CM | POA: Diagnosis not present

## 2018-11-28 DIAGNOSIS — R0609 Other forms of dyspnea: Secondary | ICD-10-CM

## 2018-11-28 MED ORDER — IPRATROPIUM-ALBUTEROL 0.5-2.5 (3) MG/3ML IN SOLN: 3.0000 mL | RESPIRATORY_TRACT | 3 refills | Status: DC | PRN

## 2018-11-28 NOTE — Patient Instructions (Signed)
Glad you are doing better The CT scan shows an improvement in the pneumonia We will order duo nebs.  He can use as instructed albuterol nebs up to 4 times a day Continue using the flutter valve, incentive spirometer and Mucinex We will give you a flu vaccine today  We will schedule pulmonary function test in 3 months Follow-up 3 months after pulmonary function tests

## 2018-11-28 NOTE — Progress Notes (Signed)
Ryan Coffey    [id number]    [birth year]  Primary Care Physician:Avva, Steva Ready, MD  Referring Physician: Prince Solian, MD [address] [place],  [place] [address]  Chief complaint: Follow-up for COPD  HPI: 85 year old with history of seropositive rheumatoid arthritis, renal cell cancer in remission, hyperlipidemia, pituitary tumor.  Admission in March 2020 for multilobar pneumonia.  He was not tested for COVID at that time.  Follow-up with ID in April 2020-it was felt that his earlier presentation was consistent with COVID infection. COVID test in April was negative but could have been a false negative Referred to pulmonary for evaluation of suspected COPD given findings of emphysema on CT scan  No heartburn symptoms or seasonal allergies He has history of seropositive rheumatoid arthritis and has been on methotrexate for 25+ years.  Pets: No pets Occupation: Retired Music therapist Exposures: No known exposures, no mold, hot tub, Jacuzzi Smoking history: 10-pack-year smoker.  Quit in 1982 Travel history: No significant recent travel Relevant family history: Father had emphysema.  He was a smoker.  Interim history: Started on Trelegy inhaler earlier in the year.  He subsequently developed a cavitary right lung pneumonia and was hospitalized in July 2020.  Since discharge he continues to improve. Not on any inhalers and does not want to try one given his previous experience with Trelegy.  Outpatient Encounter Medications as of 11/28/2018  Medication Sig  . acetaminophen (TYLENOL) 325 MG tablet Take 650 mg by mouth 2 (two) times daily as needed (arthritis).  Marland Kitchen albuterol (PROVENTIL) (2.5 MG/3ML) 0.083% nebulizer solution Take 3 mLs (2.5 mg total) by nebulization every 6 (six) hours as needed for wheezing or shortness of breath. Dx J44.1  . bromocriptine (PARLODEL) 2.5 MG tablet Take 2.5 mg by mouth 2 (two) times daily.  . Calcium Carbonate-Vitamin D (CALCIUM  500 + D) 500-125 MG-UNIT TABS Take 1 tablet by mouth 2 (two) times daily.  . clindamycin (CLEOCIN) 300 MG capsule Take 1 capsule (300 mg total) by mouth 3 (three) times daily. X 6 weeks  . folic acid (FOLVITE) 1 MG tablet TAKE 2 TABLETS EVERY DAY (Patient taking differently: Take 2 mg by mouth daily. )  . furosemide (LASIX) 20 MG tablet Take 1 tablet (20 mg total) by mouth daily.  Marland Kitchen guaiFENesin-dextromethorphan (ROBITUSSIN DM) 100-10 MG/5ML syrup Take 10 mLs by mouth every 4 (four) hours as needed for cough.  . hydrocortisone (CORTEF) 20 MG tablet Take 10-20 mg by mouth daily. 20 mg in the morning and 10mg  at night  . levothyroxine (SYNTHROID, LEVOTHROID) 137 MCG tablet Take 137 mcg by mouth daily.    . Maltodextrin-Xanthan Gum (RESOURCE THICKENUP CLEAR) POWD Take 15,000 g by mouth as needed.  . methotrexate (RHEUMATREX) 2.5 MG tablet TAKE 4 tablets on Saturday and 4 tablets on Sunday. Caution:Chemotherapy. Protect from light.  . pantoprazole (PROTONIX) 40 MG tablet Take 1 tablet (40 mg total) by mouth daily.  . pravastatin (PRAVACHOL) 80 MG tablet Take 80 mg by mouth daily.  Marland Kitchen testosterone cypionate (DEPOTESTOSTERONE CYPIONATE) 200 MG/ML injection Inject 75 mg into the muscle every 14 (fourteen) days.   Marland Kitchen zolpidem (AMBIEN CR) 6.25 MG CR tablet Take 1 tablet (6.25 mg total) by mouth at bedtime as needed for sleep.  . [DISCONTINUED] budesonide-formoterol (SYMBICORT) 80-4.5 MCG/ACT inhaler Inhale 2 puffs into the lungs 2 (two) times a day. (Patient not taking: Reported on 10/07/2018)  . [DISCONTINUED] umeclidinium-vilanterol (ANORO ELLIPTA) 62.5-25 MCG/INH AEPB Inhale 1  puff into the lungs daily.  . [DISCONTINUED] umeclidinium-vilanterol (ANORO ELLIPTA) 62.5-25 MCG/INH AEPB Inhale 1 puff into the lungs daily.   No facility-administered encounter medications on file as of 11/28/2018.    Physical Exam: Blood pressure 140/76, pulse 83, temperature 98.3 F (36.8 C), temperature source Oral, height 5\' 8"   (1.727 m), weight 152 lb 12.8 oz (69.3 kg), SpO2 95 %. Gen:      No acute distress HEENT:  EOMI, sclera anicteric Neck:     No masses; no thyromegaly Lungs:    Clear to auscultation bilaterally; normal respiratory effort CV:         Regular rate and rhythm; no murmurs Abd:      + bowel sounds; soft, non-tender; no palpable masses, no distension Ext:    No edema; adequate peripheral perfusion. Bilateral ulnar deviation of the fingers left greater than right with rheumatoid nodules. Skin:      Warm and dry; no rash Neuro: alert and oriented x 3 Psych: normal mood and affect  Data Reviewed: Imaging: CTA 05/15/2018-no pulmonary embolus, emphysema with bilateral lower lobe and right middle lobe tree-in-bud, peribronchial opacities consistent with pneumonia.  Small cystic lesions in both upper kidneys.  Chronic thyroid goiter.  Calcified aortic and coronary atherosclerosis.  I have reviewed the images personally  Chest x-ray (primary care) 06/09/2018- right lower lobe infiltrate.  Chest x-ray (primary care) 06/23/2018-resolving but persistent infiltrate of right lung base  High-res CT 09/12/2018- masslike consolidation of the right lung.  Severe emphysema.  CT chest 09/22/2018-large cavitary lung lesion in the right lower lobe.  Airspace density in the right lower lobe and right middle lobe.  Emphysema.  CT chest 11/17/2018-decrease in size of right lower lobe cavitary lesion with scarring and architectural distortion.  Groundglass opacities and tree-in-bud in the left lower lobe and lingula.  Severe emphysema.  Thyroid nodules.  I have reviewed the images personally.  Swallow eval 09/23/2018-mild pharyngeal dysphagia.  Labs: Tested for SARS-CoV-2 at primary care on 06/17/2018-negative  CBC 07/30/2018-WBC 10.5, eos 2.1%, absolute eosinophil count 221  Alpha-1 antitrypsin 08/29/2018- 196, PI MM IgE 08/29/2018-3  Assessment:  Follow-up for COPD Hospitalized for pneumonia after Trelegy was started.   Given this experience he does not want to try any other inhaler at all He is okay using nebulizer.  Will start him on duo nebs. Continue incentive spirometer, flutter valve and Mucinex  Mild aspiration risk Evaluated by speech at last hospitalization and recommended nectar thick liquids but he feels he does not have an issue and is noncompliant.  Seropositive rheumatoid arthritis, on methotrexate No evidence of RA-ILD Continue monitoring  Thyroid nodules Noted on CT chest.  Ultrasound of thyroid already ordered by his primary care.  Health Maintenance Flu vaccine today  Plan/Recommendations: - Start duo nebs - Continue flutter valve, incentive spirometer, Mucinex - Flutter valve  Marshell Garfinkel MD  Pulmonary and Critical Care 11/28/2018, 2:40 PM  CC: Avva, Ravisankar, MD

## 2018-12-05 ENCOUNTER — Other Ambulatory Visit: Payer: Self-pay | Admitting: Rheumatology

## 2018-12-08 NOTE — Telephone Encounter (Signed)
Patient advised Dr. Estanislado Pandy would like for him to have bloodwork prior to him refilling his prescription. Patient states he will come to the office to have them drawn.

## 2018-12-08 NOTE — Telephone Encounter (Signed)
Last visit: 07/30/18 Next Visit: 12/30/18 Labs: 09/24/18 RBC 3.08, Hgb 9.3, Hct 29.4 RDW 15.8, Platelets 553, Neutro Abs 8.2 10/07/18 CMET- Albumin 3.1 CO2 36  Okay to refill MTX?

## 2018-12-08 NOTE — Telephone Encounter (Signed)
Reviewed with Dr. Estanislado Pandy. Please advise patient to return for lab work prior to send in a refill due to low Hemoglobin.

## 2018-12-09 ENCOUNTER — Other Ambulatory Visit: Payer: Self-pay

## 2018-12-09 DIAGNOSIS — Z79899 Other long term (current) drug therapy: Secondary | ICD-10-CM | POA: Diagnosis not present

## 2018-12-10 ENCOUNTER — Other Ambulatory Visit: Payer: Self-pay | Admitting: *Deleted

## 2018-12-10 DIAGNOSIS — E291 Testicular hypofunction: Secondary | ICD-10-CM | POA: Diagnosis not present

## 2018-12-10 LAB — COMPLETE METABOLIC PANEL WITH GFR
AG Ratio: 1.2 (calc) (ref 1.0–2.5)
ALT: 12 U/L (ref 9–46)
AST: 17 U/L (ref 10–35)
Albumin: 3.7 g/dL (ref 3.6–5.1)
Alkaline phosphatase (APISO): 65 U/L (ref 35–144)
BUN: 24 mg/dL (ref 7–25)
CO2: 30 mmol/L (ref 20–32)
Calcium: 9.5 mg/dL (ref 8.6–10.3)
Chloride: 99 mmol/L (ref 98–110)
Creat: 0.98 mg/dL (ref 0.70–1.11)
GFR, Est African American: 82 mL/min/{1.73_m2} (ref >=60)
GFR, Est Non African American: 71 mL/min/{1.73_m2} (ref >=60)
Globulin: 3.2 g/dL (calc) (ref 1.9–3.7)
Glucose, Bld: 139 mg/dL — ABNORMAL HIGH (ref 65–99)
Potassium: 4.3 mmol/L (ref 3.5–5.3)
Sodium: 136 mmol/L (ref 135–146)
Total Bilirubin: 0.7 mg/dL (ref 0.2–1.2)
Total Protein: 6.9 g/dL (ref 6.1–8.1)

## 2018-12-10 LAB — CBC WITH DIFFERENTIAL/PLATELET
Absolute Monocytes: 1060 cells/uL — ABNORMAL HIGH (ref 200–950)
Basophils Absolute: 74 cells/uL (ref 0–200)
Basophils Relative: 0.4 %
Eosinophils Absolute: 37 cells/uL (ref 15–500)
Eosinophils Relative: 0.2 %
HCT: 36.9 % — ABNORMAL LOW (ref 38.5–50.0)
Hemoglobin: 12.2 g/dL — ABNORMAL LOW (ref 13.2–17.1)
Lymphs Abs: 1209 cells/uL (ref 850–3900)
MCH: 30 pg (ref 27.0–33.0)
MCHC: 33.1 g/dL (ref 32.0–36.0)
MCV: 90.7 fL (ref 80.0–100.0)
MPV: 9.6 fL (ref 7.5–12.5)
Monocytes Relative: 5.7 %
Neutro Abs: 16219 cells/uL — ABNORMAL HIGH (ref 1500–7800)
Neutrophils Relative %: 87.2 %
Platelets: 450 10*3/uL — ABNORMAL HIGH (ref 140–400)
RBC: 4.07 10*6/uL — ABNORMAL LOW (ref 4.20–5.80)
RDW: 14.9 % (ref 11.0–15.0)
Total Lymphocyte: 6.5 %
WBC: 18.6 10*3/uL — ABNORMAL HIGH (ref 3.8–10.8)

## 2018-12-10 NOTE — Progress Notes (Signed)
Labs are stable.  He has chronic leukocytosis most likely related to long term hydrocortisone use.

## 2018-12-10 NOTE — Telephone Encounter (Signed)
Last Visit: 07/30/18 Next Visit: 12/30/18 Labs: 12/09/18  Okay to refill per Dr. Estanislado Pandy

## 2018-12-11 MED ORDER — METHOTREXATE 2.5 MG PO TABS: ORAL_TABLET | ORAL | 0 refills | Status: DC

## 2018-12-12 ENCOUNTER — Telehealth: Payer: Self-pay | Admitting: Rheumatology

## 2018-12-12 NOTE — Telephone Encounter (Signed)
Patient called requesting prescription refill of Methotrexate to be sent to [facility] at [address]  Patient states he is out of tablets and due to take them tomorrow 12/13/18.  Patient is also requesting a return call with his labwork results.

## 2018-12-12 NOTE — Telephone Encounter (Signed)
Patient advised of lab results and that prescription was sent to the pharmacy 12/11/18.

## 2018-12-16 NOTE — Progress Notes (Signed)
Office Visit Note  Patient: Ryan Coffey             Date of Birth: 02-Jul-1932           MRN: [id number]             PCP: Prince Solian, MD Referring: Prince Solian, MD Visit Date: 12/30/2018 Occupation: @GUAROCC @  Subjective:  Medication monitoring    History of Present Illness: Ryan Coffey is a 85 y.o. male with history of seropositive rheumatoid arthritis.  Patient is taking methotrexate 4 tablets by mouth on Saturdays and 4 tablets by mouth on Sundays along with folic acid 2 mg by mouth daily.  He has not missed any doses recently.  He has not had any recent infections.  He denies any recent rheumatoid arthritis flares.  He denies any joint pain or joint swelling at this time.  He denies any joint stiffness.  He states that his rheumatoid nodules have resolved.  He has been exercising and walking on a regular basis.  He has no concerns at this time.  He does not need any refills at this time.   Activities of Daily Living:  Patient reports morning stiffness for 0 minutes.   Patient Denies nocturnal pain.  Difficulty dressing/grooming: Denies Difficulty climbing stairs: Denies Difficulty getting out of chair: Denies Difficulty using hands for taps, buttons, cutlery, and/or writing: Denies  Review of Systems  Constitutional: Negative for fatigue.  HENT: Negative for mouth sores, mouth dryness and nose dryness.   Eyes: Negative for itching and dryness.  Respiratory: Negative for shortness of breath, wheezing and difficulty breathing.   Cardiovascular: Negative for chest pain and palpitations.  Gastrointestinal: Negative for blood in stool, constipation and diarrhea.  Endocrine: Negative for increased urination.  Genitourinary: Negative for difficulty urinating and painful urination.  Musculoskeletal: Negative for arthralgias, joint pain, joint swelling and morning stiffness.  Skin: Negative for rash.  Allergic/Immunologic: Negative for susceptible to infections.    Neurological: Negative for dizziness, light-headedness, numbness, headaches, memory loss and weakness.  Hematological: Negative for bruising/bleeding tendency.  Psychiatric/Behavioral: Negative for confusion and sleep disturbance.    PMFS History:  Patient Active Problem List   Diagnosis Date Noted   Abnormal findings on diagnostic imaging of lung 10/07/2018   Dyspnea on exertion 10/07/2018   Pneumonia of right lower lobe due to infectious organism 10/07/2018   Chronic respiratory failure with hypoxia ([place]) 10/07/2018   Pressure injury of skin 09/22/2018   HAP (hospital-acquired pneumonia) 09/18/2018   Immunocompromised state ([place]) 09/18/2018   Other emphysema ([place]) 08/29/2018   Unintentional weight loss 06/18/2018   Chronic cough 06/18/2018   COPD (chronic obstructive pulmonary disease) ([place]) 08/29/2016   Rheumatoid nodulosis ([place]) 08/24/2016   High risk medication use 08/24/2016   Thrombocytosis ([place]) 08/24/2016   Elevated LFTs mild elevation of ALT  08/24/2016   History of renal cell carcinoma 08/24/2016   History of prostate cancer 08/24/2016   History of adrenal insufficiency 08/24/2016   HCAP (healthcare-associated pneumonia) 06/27/2011    Class: Acute   Seropositive rheumatoid arthritis ([place]) 06/27/2011   Panhypopituitarism ([place]) 06/27/2011   Degenerative joint disease 06/27/2011   History of tobacco use 06/27/2011   Hyperlipidemia 06/27/2011    Past Medical History:  Diagnosis Date   Arthritis    Benign tumor of pituitary gland ([place])    "wasn't able to get it all"   COPD (chronic obstructive pulmonary disease) ([place])    Pneumonia 06/27/11   "first time"  Shingles     Family History  Problem Relation Age of Onset   Heart disease Mother    Cancer Brother    Heart disease Brother    Past Surgical History:  Procedure Laterality Date   CHOLECYSTECTOMY OPEN  2010   KIDNEY SURGERY  2011   "lesion removed right side"   RENAL  BIOPSY  2012   right   TRANSPHENOIDAL / TRANSNASAL HYPOPHYSECTOMY / RESECTION PITUITARY TUMOR  ~ 1979   "they couldn't get it all"   Social History   Social History Narrative   Not on file   Immunization History  Administered Date(s) Administered   Fluad Quad(high Dose 65+) 11/28/2018   Influenza-Unspecified 11/28/2017   Pneumococcal-Unspecified 06/04/2006     Objective: Vital Signs: BP 136/83 (BP Location: Left Arm, Patient Position: Sitting, Cuff Size: Normal)    Pulse 90    Resp 16    Ht 5\' 8"  (1.727 m)    Wt 148 lb 6.4 oz (67.3 kg)    BMI 22.56 kg/m    Physical Exam Vitals signs and nursing note reviewed.  Constitutional:      Appearance: He is well-developed.  HENT:     Head: Normocephalic and atraumatic.  Eyes:     Conjunctiva/sclera: Conjunctivae normal.     Pupils: Pupils are equal, round, and reactive to light.  Neck:     Musculoskeletal: Normal range of motion and neck supple.  Cardiovascular:     Rate and Rhythm: Normal rate and regular rhythm.     Heart sounds: Normal heart sounds.  Pulmonary:     Effort: Pulmonary effort is normal.     Breath sounds: Normal breath sounds.  Abdominal:     General: Bowel sounds are normal.     Palpations: Abdomen is soft.  Skin:    General: Skin is warm and dry.     Capillary Refill: Capillary refill takes less than 2 seconds.  Neurological:     Mental Status: He is alert and oriented to person, place, and time.  Psychiatric:        Behavior: Behavior normal.      Musculoskeletal Exam: C-spine limited range of motion.  Thoracic kyphosis noted.  Limited range of motion lumbar spine.  Shoulder joints have slightly limited abduction with no discomfort.  Elbow joint contractures noted.  Very limited range of motion of bilateral wrist joints.  He is ulnar deviation bilaterally.  Subluxation of most of MCP joints and incomplete fist formation bilaterally.  He has limited extension of his fingers.  Nodules as noted on his  hands.  He has synovial thickening of bilateral knee joints and bilateral ankle joints.  He has no tenderness or inflammation of the knee joints or ankle joints on exam.  CDAI Exam: CDAI Score: 0.2  Patient Global: 1 mm; Provider Global: 1 mm Swollen: 0 ; Tender: 0  Joint Exam   No joint exam has been documented for this visit   There is currently no information documented on the homunculus. Go to the Rheumatology activity and complete the homunculus joint exam.  Investigation: No additional findings.  Imaging: No results found.  Recent Labs: Lab Results  Component Value Date   WBC 18.6 (H) 12/09/2018   HGB 12.2 (L) 12/09/2018   PLT 450 (H) 12/09/2018   NA 136 12/09/2018   K 4.3 12/09/2018   CL 99 12/09/2018   CO2 30 12/09/2018   GLUCOSE 139 (H) 12/09/2018   BUN 24 12/09/2018   CREATININE 0.98  12/09/2018   BILITOT 0.7 12/09/2018   ALKPHOS 88 10/07/2018   AST 17 12/09/2018   ALT 12 12/09/2018   PROT 6.9 12/09/2018   ALBUMIN 3.1 (L) 10/07/2018   CALCIUM 9.5 12/09/2018   GFRAA 82 12/09/2018    Speciality Comments: No specialty comments available.  Procedures:  No procedures performed Allergies: Patient has no known allergies.   Assessment / Plan:     Visit Diagnoses: Seropositive rheumatoid arthritis ([place]) - +RF. Severe erosive disease with subluxation in multiple joints: He has no synovitis on exam.  He has not had any recent rheumatoid arthritis flares.  He has no joint pain or joint swelling.  No morning stiffness.  He has no tenderness or inflammation on exam today.  He is clinically doing well on Methotrexate 4 tablets by mouth on Saturdays and 4 tablets by mouth on Sundays along with folic acid 2 mg po daily.  He will continue on this current treatment regimen.  He does not need any refills at this time.  He was advised to notify us if he develops increased joint pain or joint swelling. He will follow up in 5 months.   High risk medication use - Methotrexate 2.5  mg 4 tablets on Saturday and 4 tablets on Sunday along with folic acid 1 mg 2 tablet daily.  Most recent CBC/CMP within normal limits except for elevated WBC, elevated platelets, and low hemoglobin which have been stable on 12/09/2018.  He will be due to update lab work in January and every 3 months.  He has not had any recent infections.    Rheumatoid nodulosis ([place]): He has a few scattered rheumatoid nodules present.  History of kyphosis -He has thoracic kyphosis.  No midline spinal tenderness.  he has bone densities ordered through Dr. Redge Gainer office.  Other medical conditions are listed as follows:   Panhypopituitarism ([place])  History of adrenal insufficiency  History of kidney stones  History of prostate cancer  History of renal cell carcinoma  History of hyperlipidemia  History of peptic ulcer disease  History of lung cancer  History of COPD  History of tobacco use  Orders: No orders of the defined types were placed in this encounter.  No orders of the defined types were placed in this encounter.    Follow-Up Instructions: Return in about 5 months (around 05/30/2019) for Rheumatoid arthritis.   Ofilia Neas, PA-C   I examined and evaluated the patient with Hazel Sams PA.  Patient had no synovitis on examination.  He has been tolerating methotrexate well.  We will continue to monitor labs.  The plan of care was discussed as noted above.  Bo Merino, MD  Note - This record has been created using Editor, commissioning.  Chart creation errors have been sought, but may not always  have been located. Such creation errors do not reflect on  the standard of medical care.

## 2018-12-24 DIAGNOSIS — E291 Testicular hypofunction: Secondary | ICD-10-CM | POA: Diagnosis not present

## 2018-12-30 ENCOUNTER — Encounter: Payer: Self-pay | Admitting: Rheumatology

## 2018-12-30 ENCOUNTER — Other Ambulatory Visit: Payer: Self-pay

## 2018-12-30 ENCOUNTER — Ambulatory Visit (INDEPENDENT_AMBULATORY_CARE_PROVIDER_SITE_OTHER): Payer: Medicare Other | Admitting: Rheumatology

## 2018-12-30 VITALS — BP 136/83 | HR 90 | Resp 16 | Ht 68.0 in | Wt 148.4 lb

## 2018-12-30 DIAGNOSIS — Z8709 Personal history of other diseases of the respiratory system: Secondary | ICD-10-CM | POA: Diagnosis not present

## 2018-12-30 DIAGNOSIS — Z85528 Personal history of other malignant neoplasm of kidney: Secondary | ICD-10-CM

## 2018-12-30 DIAGNOSIS — Z87891 Personal history of nicotine dependence: Secondary | ICD-10-CM

## 2018-12-30 DIAGNOSIS — M059 Rheumatoid arthritis with rheumatoid factor, unspecified: Secondary | ICD-10-CM

## 2018-12-30 DIAGNOSIS — Z85118 Personal history of other malignant neoplasm of bronchus and lung: Secondary | ICD-10-CM

## 2018-12-30 DIAGNOSIS — Z8739 Personal history of other diseases of the musculoskeletal system and connective tissue: Secondary | ICD-10-CM

## 2018-12-30 DIAGNOSIS — Z8711 Personal history of peptic ulcer disease: Secondary | ICD-10-CM

## 2018-12-30 DIAGNOSIS — Z87442 Personal history of urinary calculi: Secondary | ICD-10-CM

## 2018-12-30 DIAGNOSIS — Z8639 Personal history of other endocrine, nutritional and metabolic disease: Secondary | ICD-10-CM

## 2018-12-30 DIAGNOSIS — M0630 Rheumatoid nodule, unspecified site: Secondary | ICD-10-CM

## 2018-12-30 DIAGNOSIS — Z79899 Other long term (current) drug therapy: Secondary | ICD-10-CM | POA: Diagnosis not present

## 2018-12-30 DIAGNOSIS — E230 Hypopituitarism: Secondary | ICD-10-CM | POA: Diagnosis not present

## 2018-12-30 DIAGNOSIS — Z8546 Personal history of malignant neoplasm of prostate: Secondary | ICD-10-CM

## 2019-01-07 DIAGNOSIS — E291 Testicular hypofunction: Secondary | ICD-10-CM | POA: Diagnosis not present

## 2019-01-15 DIAGNOSIS — E291 Testicular hypofunction: Secondary | ICD-10-CM | POA: Diagnosis not present

## 2019-01-16 ENCOUNTER — Telehealth: Payer: Self-pay | Admitting: Pulmonary Disease

## 2019-01-16 DIAGNOSIS — J449 Chronic obstructive pulmonary disease, unspecified: Secondary | ICD-10-CM

## 2019-01-16 NOTE — Telephone Encounter (Signed)
  Spoke with pt, he states he hasn't used his oxygen in 2 weeks and states his oxygen has been in the 90's so he doesn't need it. He doesn't have an appointment scheduled and is requesting [place] to come pick it up. We need to send an order. Dr. Vaughan Browner can we send order?  [phone]  Patient Instructions by Marshell Garfinkel, MD at 11/28/2018 2:15 PM Author: Marshell Garfinkel, MD Author Type: Physician Filed: 11/28/2018 2:58 PM  Note Status: Signed Cosign: Cosign Not Required Encounter Date: 11/28/2018  Editor: Marshell Garfinkel, MD (Physician)    Glad you are doing better The CT scan shows an improvement in the pneumonia We will order duo nebs.  He can use as instructed albuterol nebs up to 4 times a day Continue using the flutter valve, incentive spirometer and Mucinex We will give you a flu vaccine today  We will schedule pulmonary function test in 3 months Follow-up 3 months after pulmonary function tests

## 2019-01-19 NOTE — Telephone Encounter (Signed)
Order placed to have O2 discontinued. Called and spoke with pt letting him know this had been done and stated to him that Dr. Vaughan Browner wants him to come for follow up in 4-8 weeks and have [name] walk him so we can see how he is doing without the O2. Pt verbalized understanding. appt scheduled for pt with Dr. Vaughan Browner 12/14 at 11:45. Nothing further needed.

## 2019-01-19 NOTE — Telephone Encounter (Signed)
Ok to discontinue O2. Make routine follow up visit with me in next 4-8 weeks with routine walk test

## 2019-02-04 DIAGNOSIS — E291 Testicular hypofunction: Secondary | ICD-10-CM | POA: Diagnosis not present

## 2019-02-16 ENCOUNTER — Ambulatory Visit: Payer: Medicare Other | Admitting: Pulmonary Disease

## 2019-02-24 DIAGNOSIS — E7849 Other hyperlipidemia: Secondary | ICD-10-CM | POA: Diagnosis not present

## 2019-02-24 DIAGNOSIS — M810 Age-related osteoporosis without current pathological fracture: Secondary | ICD-10-CM | POA: Diagnosis not present

## 2019-02-24 DIAGNOSIS — E038 Other specified hypothyroidism: Secondary | ICD-10-CM | POA: Diagnosis not present

## 2019-03-03 ENCOUNTER — Other Ambulatory Visit: Payer: Self-pay | Admitting: Rheumatology

## 2019-03-03 NOTE — Telephone Encounter (Signed)
Last Visit: 12/30/2018 Next Visit: 05/26/2019 Labs: 12/09/2018 Labs are stable. He has chronic leukocytosis most likely related to long term hydrocortisone use.   Okay to refill per Dr. Estanislado Pandy.

## 2019-03-18 DIAGNOSIS — E291 Testicular hypofunction: Secondary | ICD-10-CM | POA: Diagnosis not present

## 2019-04-11 ENCOUNTER — Ambulatory Visit: Payer: Medicare Other | Attending: Internal Medicine

## 2019-04-11 DIAGNOSIS — Z23 Encounter for immunization: Secondary | ICD-10-CM | POA: Insufficient documentation

## 2019-04-11 NOTE — Progress Notes (Signed)
   Covid-19 Vaccination Clinic  Name:  Ryan Coffey    MRN: [id number] DOB: 06/24/1932  04/11/2019  Mr. [name] was observed post Covid-19 immunization for 15 minutes without incidence. He was provided with Vaccine Information Sheet and instruction to access the V-Safe system.   Mr. [name] was instructed to call 911 with any severe reactions post vaccine: Marland Kitchen Difficulty breathing  . Swelling of your face and throat  . A fast heartbeat  . A bad rash all over your body  . Dizziness and weakness    Immunizations Administered    Name Date Dose VIS Date Route   Pfizer COVID-19 Vaccine 04/11/2019 11:24 AM 0.3 mL 02/13/2019 Intramuscular   Manufacturer: [place]   Lot: [id number]   [place]: [address]-7

## 2019-04-15 DIAGNOSIS — E876 Hypokalemia: Secondary | ICD-10-CM | POA: Diagnosis not present

## 2019-04-15 DIAGNOSIS — E291 Testicular hypofunction: Secondary | ICD-10-CM | POA: Diagnosis not present

## 2019-04-29 ENCOUNTER — Ambulatory Visit: Payer: Medicare Other

## 2019-04-29 DIAGNOSIS — E291 Testicular hypofunction: Secondary | ICD-10-CM | POA: Diagnosis not present

## 2019-05-06 ENCOUNTER — Ambulatory Visit: Payer: Medicare Other | Attending: Internal Medicine

## 2019-05-06 DIAGNOSIS — Z23 Encounter for immunization: Secondary | ICD-10-CM | POA: Insufficient documentation

## 2019-05-06 NOTE — Progress Notes (Signed)
   Covid-19 Vaccination Clinic  Name:  AIRAM HEIDECKER    MRN: [id number] DOB: 09-24-1932  05/06/2019  Mr. Biehl was observed post Covid-19 immunization for 15 minutes without incident. He was provided with Vaccine Information Sheet and instruction to access the V-Safe system.   Mr. Warnell was instructed to call 911 with any severe reactions post vaccine: Marland Kitchen Difficulty breathing  . Swelling of face and throat  . A fast heartbeat  . A bad rash all over body  . Dizziness and weakness   Immunizations Administered    Name Date Dose VIS Date Route   Pfizer COVID-19 Vaccine 05/06/2019 10:43 AM 0.3 mL 02/13/2019 Intramuscular   Manufacturer: [employer]   Lot: [id number]   [place]: [id number]

## 2019-05-07 ENCOUNTER — Other Ambulatory Visit: Payer: Self-pay

## 2019-05-07 DIAGNOSIS — Z79899 Other long term (current) drug therapy: Secondary | ICD-10-CM | POA: Diagnosis not present

## 2019-05-08 LAB — CBC WITH DIFFERENTIAL/PLATELET
Absolute Monocytes: 1222 cells/uL — ABNORMAL HIGH (ref 200–950)
Basophils Absolute: 76 cells/uL (ref 0–200)
Basophils Relative: 0.6 %
Eosinophils Absolute: 315 cells/uL (ref 15–500)
Eosinophils Relative: 2.5 %
HCT: 40.7 % (ref 38.5–50.0)
Hemoglobin: 13.3 g/dL (ref 13.2–17.1)
Lymphs Abs: 1751 cells/uL (ref 850–3900)
MCH: 29.2 pg (ref 27.0–33.0)
MCHC: 32.7 g/dL (ref 32.0–36.0)
MCV: 89.3 fL (ref 80.0–100.0)
MPV: 9.6 fL (ref 7.5–12.5)
Monocytes Relative: 9.7 %
Neutro Abs: 9236 cells/uL — ABNORMAL HIGH (ref 1500–7800)
Neutrophils Relative %: 73.3 %
Platelets: 423 10*3/uL — ABNORMAL HIGH (ref 140–400)
RBC: 4.56 10*6/uL (ref 4.20–5.80)
RDW: 17.3 % — ABNORMAL HIGH (ref 11.0–15.0)
Total Lymphocyte: 13.9 %
WBC: 12.6 10*3/uL — ABNORMAL HIGH (ref 3.8–10.8)

## 2019-05-08 LAB — COMPLETE METABOLIC PANEL WITH GFR
AG Ratio: 1.1 (calc) (ref 1.0–2.5)
ALT: 7 U/L — ABNORMAL LOW (ref 9–46)
AST: 12 U/L (ref 10–35)
Albumin: 3.4 g/dL — ABNORMAL LOW (ref 3.6–5.1)
Alkaline phosphatase (APISO): 61 U/L (ref 35–144)
BUN: 20 mg/dL (ref 7–25)
CO2: 29 mmol/L (ref 20–32)
Calcium: 9.1 mg/dL (ref 8.6–10.3)
Chloride: 104 mmol/L (ref 98–110)
Creat: 0.84 mg/dL (ref 0.70–1.11)
GFR, Est African American: 93 mL/min/{1.73_m2} (ref >=60)
GFR, Est Non African American: 80 mL/min/{1.73_m2} (ref >=60)
Globulin: 3 g/dL (calc) (ref 1.9–3.7)
Glucose, Bld: 90 mg/dL (ref 65–99)
Potassium: 4.2 mmol/L (ref 3.5–5.3)
Sodium: 140 mmol/L (ref 135–146)
Total Bilirubin: 0.7 mg/dL (ref 0.2–1.2)
Total Protein: 6.4 g/dL (ref 6.1–8.1)

## 2019-05-08 NOTE — Progress Notes (Signed)
White cell count is high because of steroid use.  All other labs are stable.

## 2019-05-12 DIAGNOSIS — L578 Other skin changes due to chronic exposure to nonionizing radiation: Secondary | ICD-10-CM | POA: Diagnosis not present

## 2019-05-12 DIAGNOSIS — L570 Actinic keratosis: Secondary | ICD-10-CM | POA: Diagnosis not present

## 2019-05-13 DIAGNOSIS — E291 Testicular hypofunction: Secondary | ICD-10-CM | POA: Diagnosis not present

## 2019-05-18 NOTE — Progress Notes (Signed)
Office Visit Note  Patient: Ryan Coffey             Date of Birth: [birth year]           MRN: [id number]             PCP: Prince Solian, MD Referring: Prince Solian, MD Visit Date: 05/26/2019 Occupation: @GUAROCC @  Subjective:  Medication monitoring   History of Present Illness: Ryan Coffey is a 86 y.o. male with history of seropositive rheumatoid arthritis.  Patient is on methotrexate 4 tablets by mouth on Saturdays and 4 tablets by mouth on Sundays.  He continues take folic acid 2 mg a mouth daily.  He is not missed any doses of methotrexate or folic acid recently.  He has received both COVID-19 vaccinations.  He denies any recent rheumatoid arthritis flares.  He denies any joint pain or joint swelling at this time.  Activities of Daily Living:  Patient reports morning stiffness for 0 minutes.   Patient Denies nocturnal pain.  Difficulty dressing/grooming: Denies Difficulty climbing stairs: Denies Difficulty getting out of chair: Denies Difficulty using hands for taps, buttons, cutlery, and/or writing: Denies  Review of Systems  Constitutional: Negative for fatigue and night sweats.  HENT: Negative for mouth sores, mouth dryness and nose dryness.   Eyes: Negative for redness, itching and dryness.  Respiratory: Negative for cough, shortness of breath, wheezing and difficulty breathing.   Cardiovascular: Negative for chest pain, palpitations, hypertension, irregular heartbeat and swelling in legs/feet.  Gastrointestinal: Negative for blood in stool, constipation and diarrhea.  Endocrine: Negative for increased urination.  Genitourinary: Negative for difficulty urinating and painful urination.  Musculoskeletal: Negative for arthralgias, joint pain, joint swelling, myalgias, muscle weakness, morning stiffness, muscle tenderness and myalgias.  Skin: Negative for color change, rash, hair loss, nodules/bumps, redness, skin tightness, ulcers and sensitivity to sunlight.    Allergic/Immunologic: Negative for susceptible to infections.  Neurological: Negative for dizziness, fainting, numbness, headaches, memory loss, night sweats and weakness.  Hematological: Negative for bruising/bleeding tendency and swollen glands.  Psychiatric/Behavioral: Negative for depressed mood, confusion and sleep disturbance. The patient is not nervous/anxious.     PMFS History:  Patient Active Problem List   Diagnosis Date Noted  . Abnormal findings on diagnostic imaging of lung 10/07/2018  . Dyspnea on exertion 10/07/2018  . Pneumonia of right lower lobe due to infectious organism 10/07/2018  . Chronic respiratory failure with hypoxia ([place]) 10/07/2018  . Pressure injury of skin 09/22/2018  . HAP (hospital-acquired pneumonia) 09/18/2018  . Immunocompromised state ([place]) 09/18/2018  . Other emphysema ([place]) 08/29/2018  . Unintentional weight loss 06/18/2018  . Chronic cough 06/18/2018  . COPD (chronic obstructive pulmonary disease) ([place]) 08/29/2016  . Rheumatoid nodulosis ([place]) 08/24/2016  . High risk medication use 08/24/2016  . Thrombocytosis ([place]) 08/24/2016  . Elevated LFTs mild elevation of ALT  08/24/2016  . History of renal cell carcinoma 08/24/2016  . History of prostate cancer 08/24/2016  . History of adrenal insufficiency 08/24/2016  . HCAP (healthcare-associated pneumonia) 06/27/2011    Class: Acute  . Seropositive rheumatoid arthritis ([place]) 06/27/2011  . Panhypopituitarism ([place]) 06/27/2011  . Degenerative joint disease 06/27/2011  . History of tobacco use 06/27/2011  . Hyperlipidemia 06/27/2011    Past Medical History:  Diagnosis Date  . Arthritis   . Benign tumor of pituitary gland ([place])    "wasn't able to get it all"  . COPD (chronic obstructive pulmonary disease) ([place])   . Pneumonia 06/27/11   "first  time"  . Shingles     Family History  Problem Relation Age of Onset  . Heart disease Mother   . Cancer Brother   . Heart disease Brother    Past  Surgical History:  Procedure Laterality Date  . CHOLECYSTECTOMY OPEN  2010  . KIDNEY SURGERY  2011   "lesion removed right side"  . RENAL BIOPSY  2012   right  . TRANSPHENOIDAL / TRANSNASAL HYPOPHYSECTOMY / RESECTION PITUITARY TUMOR  ~ 1979   "they couldn't get it all"   Social History   Social History Narrative  . Not on file   Immunization History  Administered Date(s) Administered  . Fluad Quad(high Dose 65+) 11/28/2018  . Influenza-Unspecified 11/28/2017  . PFIZER SARS-COV-2 Vaccination 04/11/2019, 05/06/2019  . Pneumococcal-Unspecified 06/04/2006     Objective: Vital Signs: BP 121/74 (BP Location: Left Arm, Patient Position: Sitting, Cuff Size: Normal)   Pulse 89   Resp 16   Ht 5\' 8"  (1.727 m)   Wt 138 lb 9.6 oz (62.9 kg)   BMI 21.07 kg/m    Physical Exam Vitals and nursing note reviewed.  Constitutional:      Appearance: He is well-developed.  HENT:     Head: Normocephalic and atraumatic.  Eyes:     Conjunctiva/sclera: Conjunctivae normal.     Pupils: Pupils are equal, round, and reactive to light.  Pulmonary:     Effort: Pulmonary effort is normal.  Abdominal:     General: Bowel sounds are normal.     Palpations: Abdomen is soft.  Musculoskeletal:     Cervical back: Normal range of motion and neck supple.  Skin:    General: Skin is warm and dry.     Capillary Refill: Capillary refill takes less than 2 seconds.  Neurological:     Mental Status: He is alert and oriented to person, place, and time.  Psychiatric:        Behavior: Behavior normal.      Musculoskeletal Exam: C-spine limited range of motion.  Thoracic kyphosis noted.  Limited range of motion lumbar spine.  Shoulder joints have slightly limited abduction.  Elbow joint contractures noted bilaterally.  Extremely limited range of motion of both wrist joints.  Ulnar deviation and subluxation of all MCP joints bilaterally, more severe in left hand.  Incomplete fist formation bilaterally.  Synovial  thickening of bilateral knee joints and bilateral ankle joints.  No warmth or effusion of knee joint or ankle joint on exam.  CDAI Exam: CDAI Score: 0  Patient Global: 0 mm; Provider Global: 0 mm Swollen: 0 ; Tender: 0  Joint Exam 05/26/2019   No joint exam has been documented for this visit   There is currently no information documented on the homunculus. Go to the Rheumatology activity and complete the homunculus joint exam.  Investigation: No additional findings.  Imaging: No results found.  Recent Labs: Lab Results  Component Value Date   WBC 12.6 (H) 05/07/2019   HGB 13.3 05/07/2019   PLT 423 (H) 05/07/2019   NA 140 05/07/2019   K 4.2 05/07/2019   CL 104 05/07/2019   CO2 29 05/07/2019   GLUCOSE 90 05/07/2019   BUN 20 05/07/2019   CREATININE 0.84 05/07/2019   BILITOT 0.7 05/07/2019   ALKPHOS 88 10/07/2018   AST 12 05/07/2019   ALT 7 (L) 05/07/2019   PROT 6.4 05/07/2019   ALBUMIN 3.1 (L) 10/07/2018   CALCIUM 9.1 05/07/2019   GFRAA 93 05/07/2019    Speciality Comments:  No specialty comments available.  Procedures:  No procedures performed Allergies: Patient has no known allergies.   Assessment / Plan:     Visit Diagnoses: Seropositive rheumatoid arthritis ([place]) - +RF. Severe erosive disease with subluxation in multiple joints: He has no synovitis on exam today.  He has not had any recent rheumatoid arthritis flares.  He has synovial thickening and contractures of multiple joints as described above.  He has severe ulnar deviation in the left hand and incomplete fist formation bilaterally.  He has no active inflammation at this time.  He is clinically stable on methotrexate 4 tablets by mouth on Saturdays and 4 tablets by mouth on Sundays along with folic acid 2 mg daily.  He has not missed any doses of methotrexate or folic acid recently.  He will continue on the current treatment regimen.  He was advised to notify us if he develops increased joint pain or joint  swelling.  He will follow-up in the office in 5 months.  High risk medication use - Methotrexate 2.5 mg 4 tablets on Saturday and 4 tablets on Sunday along with folic acid 1 mg 2 tablet daily.  CBC and CMP were stable on 05/07/2019.  He will be due to update lab work in June and every 3 months to monitor for drug toxicity.  He has received both COVID-19 vaccinations.  He has not had any recent infections.  Rheumatoid nodulosis ([place]): Chronic, unchanged.   History of kyphosis -Thoracic kyphosis noted.  No midline spinal tenderness. Bone densities ordered through Dr. Donell Beers office.  Other medical conditions are listed as follows:   Panhypopituitarism ([place])  History of adrenal insufficiency  History of prostate cancer  History of kidney stones  History of renal cell carcinoma  History of hyperlipidemia  History of peptic ulcer disease  History of COPD  History of lung cancer  History of tobacco use  Orders: No orders of the defined types were placed in this encounter.  No orders of the defined types were placed in this encounter.    Follow-Up Instructions: Return in about 5 months (around 10/26/2019) for Rheumatoid arthritis.   Hazel Sams, PA-C  I examined and evaluated the patient with Hazel Sams PA.  Patient had no synovitis on examination.  He has a lot of joint deformities which keeps him from doing activities.  He will continue current treatment.  He has been tolerating methotrexate well.  The plan of care was discussed as noted above.  Bo Merino, MD  Note - This record has been created using Editor, commissioning.  Chart creation errors have been sought, but may not always  have been located. Such creation errors do not reflect on  the standard of medical care.

## 2019-05-23 ENCOUNTER — Other Ambulatory Visit: Payer: Self-pay | Admitting: Rheumatology

## 2019-05-25 NOTE — Telephone Encounter (Addendum)
Last Visit: 12/30/2018 Next Visit: 05/26/2019 Labs: 05/07/19 White cell count is high because of steroid use. All other labs are stable.  Current Dose per office note on 12/30/18: Methotrexate 4 tablets by mouth on Saturdays and 4 tablets by mouth on Sundays   Okay to refill per Dr. Estanislado Pandy

## 2019-05-26 ENCOUNTER — Other Ambulatory Visit: Payer: Self-pay

## 2019-05-26 ENCOUNTER — Ambulatory Visit (INDEPENDENT_AMBULATORY_CARE_PROVIDER_SITE_OTHER): Payer: Medicare Other | Admitting: Rheumatology

## 2019-05-26 ENCOUNTER — Encounter: Payer: Self-pay | Admitting: Rheumatology

## 2019-05-26 VITALS — BP 121/74 | HR 89 | Resp 16 | Ht 68.0 in | Wt 138.6 lb

## 2019-05-26 DIAGNOSIS — M0630 Rheumatoid nodule, unspecified site: Secondary | ICD-10-CM | POA: Diagnosis not present

## 2019-05-26 DIAGNOSIS — Z8709 Personal history of other diseases of the respiratory system: Secondary | ICD-10-CM

## 2019-05-26 DIAGNOSIS — Z79899 Other long term (current) drug therapy: Secondary | ICD-10-CM | POA: Diagnosis not present

## 2019-05-26 DIAGNOSIS — M059 Rheumatoid arthritis with rheumatoid factor, unspecified: Secondary | ICD-10-CM | POA: Diagnosis not present

## 2019-05-26 DIAGNOSIS — Z87891 Personal history of nicotine dependence: Secondary | ICD-10-CM

## 2019-05-26 DIAGNOSIS — Z8711 Personal history of peptic ulcer disease: Secondary | ICD-10-CM | POA: Diagnosis not present

## 2019-05-26 DIAGNOSIS — Z85528 Personal history of other malignant neoplasm of kidney: Secondary | ICD-10-CM

## 2019-05-26 DIAGNOSIS — Z87442 Personal history of urinary calculi: Secondary | ICD-10-CM

## 2019-05-26 DIAGNOSIS — Z8546 Personal history of malignant neoplasm of prostate: Secondary | ICD-10-CM | POA: Diagnosis not present

## 2019-05-26 DIAGNOSIS — Z8639 Personal history of other endocrine, nutritional and metabolic disease: Secondary | ICD-10-CM

## 2019-05-26 DIAGNOSIS — Z8739 Personal history of other diseases of the musculoskeletal system and connective tissue: Secondary | ICD-10-CM

## 2019-05-26 DIAGNOSIS — E230 Hypopituitarism: Secondary | ICD-10-CM

## 2019-05-26 DIAGNOSIS — Z85118 Personal history of other malignant neoplasm of bronchus and lung: Secondary | ICD-10-CM | POA: Diagnosis not present

## 2019-05-26 NOTE — Patient Instructions (Signed)
Standing Labs We placed an order today for your standing lab work.    Please come back and get your standing labs in June and every 3 months.   We have open lab daily Monday through Thursday from 8:30-12:30 PM and 1:30-4:30 PM and Friday from 8:30-12:30 PM and 1:30-4:00 PM at the office of Dr. Shaili Deveshwar.   You may experience shorter wait times on Monday and Friday afternoons. The office is located at [address], [address], [place], [place] [address] No appointment is necessary.   Labs are drawn by [facility].  You may receive a bill from [facility] for your lab work.  If you wish to have your labs drawn at another location, please call the office 24 hours in advance to send orders.  If you have any questions regarding directions or hours of operation,  please call [phone].   Just as a reminder please drink plenty of water prior to coming for your lab work. Thanks!  

## 2019-05-27 DIAGNOSIS — E291 Testicular hypofunction: Secondary | ICD-10-CM | POA: Diagnosis not present

## 2019-06-11 DIAGNOSIS — E291 Testicular hypofunction: Secondary | ICD-10-CM | POA: Diagnosis not present

## 2019-06-24 DIAGNOSIS — E291 Testicular hypofunction: Secondary | ICD-10-CM | POA: Diagnosis not present

## 2019-06-29 ENCOUNTER — Ambulatory Visit (INDEPENDENT_AMBULATORY_CARE_PROVIDER_SITE_OTHER): Payer: Medicare Other | Admitting: Pulmonary Disease

## 2019-06-29 ENCOUNTER — Encounter: Payer: Self-pay | Admitting: Pulmonary Disease

## 2019-06-29 ENCOUNTER — Other Ambulatory Visit: Payer: Self-pay

## 2019-06-29 VITALS — BP 122/60 | HR 95 | Temp 97.3°F | Ht 68.0 in | Wt 140.2 lb

## 2019-06-29 DIAGNOSIS — J449 Chronic obstructive pulmonary disease, unspecified: Secondary | ICD-10-CM

## 2019-06-29 DIAGNOSIS — R0602 Shortness of breath: Secondary | ICD-10-CM | POA: Diagnosis not present

## 2019-06-29 NOTE — Progress Notes (Signed)
Ryan Coffey    [id number]    [birth year]  Primary Care Physician:Avva, Steva Ready, MD  Referring Physician: Prince Solian, MD [address] [place],  [place] [address]  Chief complaint:  Problem list COPD Rheumatoid arthritis on methotrexate  HPI: 86 year old with history of seropositive rheumatoid arthritis, renal cell cancer in remission, hyperlipidemia, pituitary tumor.  Admission in March 2020 for multilobar pneumonia.  He was not tested for COVID at that time.  Follow-up with ID in April 2020-it was felt that his earlier presentation was consistent with COVID infection. COVID test in April was negative but could have been a false negative Referred to pulmonary for evaluation of suspected COPD given findings of emphysema on CT scan  No heartburn symptoms or seasonal allergies He has history of seropositive rheumatoid arthritis and has been on methotrexate for 25+ years. Follows with Dr. Estanislado Pandy.  Started on Trelegy inhaler earlier in 2020.  He subsequently developed a cavitary right lung pneumonia and was hospitalized in July 2020.  Follow up CT showed improving PNA Not on any inhalers and does not want to try one given his previous experience with Trelegy.  Pets: No pets Occupation: Retired Music therapist Exposures: No known exposures, no mold, hot tub, Jacuzzi Smoking history: 10-pack-year smoker.  Quit in 1982 Travel history: No significant recent travel Relevant family history: Father had emphysema.  He was a smoker.  Interim history: States that breathing is stable.  Has cough with congestion Requesting a new flutter device called Airphysio as the old one is not working.  Outpatient Encounter Medications as of 06/29/2019  Medication Sig  . acetaminophen (TYLENOL) 325 MG tablet Take 650 mg by mouth 2 (two) times daily as needed (arthritis).  . bromocriptine (PARLODEL) 2.5 MG tablet Take 2.5 mg by mouth 2 (two) times daily.  . Calcium  Carbonate-Vitamin D (CALCIUM 500 + D) 500-125 MG-UNIT TABS Take 1 tablet by mouth 2 (two) times daily.  . folic acid (FOLVITE) 1 MG tablet TAKE 2 TABLETS EVERY DAY (Patient taking differently: Take 2 mg by mouth daily. )  . guaiFENesin-dextromethorphan (ROBITUSSIN DM) 100-10 MG/5ML syrup Take 10 mLs by mouth every 4 (four) hours as needed for cough.  . hydrocortisone (CORTEF) 20 MG tablet Take 10-20 mg by mouth daily. 20 mg in the morning and 10mg  at night  . levothyroxine (SYNTHROID, LEVOTHROID) 137 MCG tablet Take 137 mcg by mouth daily.    . methotrexate (RHEUMATREX) 2.5 MG tablet TAKE 4 TABLETS ON SATURDAY AND 4 TABLETS ON SUNDAY. CAUTION:CHEMOTHERAPY. PROTECT FROM LIGHT.  Marland Kitchen pantoprazole (PROTONIX) 40 MG tablet Take 1 tablet (40 mg total) by mouth daily.  . pravastatin (PRAVACHOL) 80 MG tablet Take 80 mg by mouth daily.  Marland Kitchen testosterone cypionate (DEPOTESTOSTERONE CYPIONATE) 200 MG/ML injection Inject 75 mg into the muscle every 14 (fourteen) days.   Marland Kitchen zolpidem (AMBIEN CR) 6.25 MG CR tablet Take 1 tablet (6.25 mg total) by mouth at bedtime as needed for sleep.   No facility-administered encounter medications on file as of 06/29/2019.   Physical Exam: Blood pressure 122/60, pulse 95, temperature (!) 97.3 F (36.3 C), temperature source Temporal, height 5\' 8"  (1.727 m), weight 140 lb 3.2 oz (63.6 kg), SpO2 93 %. Gen:      No acute distress HEENT:  EOMI, sclera anicteric Neck:     No masses; no thyromegaly Lungs:    Clear to auscultation bilaterally; normal respiratory effort CV:  Regular rate and rhythm; no murmurs Abd:      + bowel sounds; soft, non-tender; no palpable masses, no distension Ext:    No edema; adequate peripheral perfusion Skin:      Warm and dry; no rash Neuro: alert and oriented x 3 Psych: normal mood and affect  Data Reviewed: Imaging: CTA 05/15/2018-no pulmonary embolus, emphysema with bilateral lower lobe and right middle lobe tree-in-bud, peribronchial  opacities consistent with pneumonia.  Small cystic lesions in both upper kidneys.  Chronic thyroid goiter.  Calcified aortic and coronary atherosclerosis.  I have reviewed the images personally  Chest x-ray (primary care) 06/09/2018- right lower lobe infiltrate.  Chest x-ray (primary care) 06/23/2018-resolving but persistent infiltrate of right lung base  High-res CT 09/12/2018- masslike consolidation of the right lung.  Severe emphysema.  CT chest 09/22/2018-large cavitary lung lesion in the right lower lobe.  Airspace density in the right lower lobe and right middle lobe.  Emphysema.  CT chest 11/17/2018-decrease in size of right lower lobe cavitary lesion with scarring and architectural distortion.  Groundglass opacities and tree-in-bud in the left lower lobe and lingula.  Severe emphysema.  Thyroid nodules.  I have reviewed the images personally.  Swallow eval 09/23/2018-mild pharyngeal dysphagia.  Labs: Tested for SARS-CoV-2 at primary care on 06/17/2018-negative  CBC 07/30/2018-WBC 10.5, eos 2.1%, absolute eosinophil count 221  Alpha-1 antitrypsin 08/29/2018- 196, PI MM IgE 08/29/2018-3  Assessment:  Follow-up for COPD Hospitalized for pneumonia after Trelegy was started.  Given this experience he does not want to try any other inhaler at all He is okay using nebulizer.  Will start him on duo nebs. Continue incentive spirometer, flutter valve and Mucinex Order airphysio  Mild aspiration risk Evaluated by speech at last hospitalization and recommended nectar thick liquids but he feels he does not have an issue and is noncompliant.  Seropositive rheumatoid arthritis, on methotrexate No evidence of RA-ILD. Follow up CT in sept 2021 Continue monitoring  Plan/Recommendations: - Continue flutter valve, incentive spirometer, Mucinex - Follow up CT  Marshell Garfinkel MD [facility] 06/29/2019, 11:27 AM  CC: Prince Solian, MD

## 2019-06-29 NOTE — Patient Instructions (Signed)
I am glad you are doing well with regard to your breathing Continue the flutter device.  We will call in a new prescription for this We will get a CT chest without contrast in September 2021 Follow-up in clinic after CT.

## 2019-07-08 DIAGNOSIS — E291 Testicular hypofunction: Secondary | ICD-10-CM | POA: Diagnosis not present

## 2019-07-22 DIAGNOSIS — E291 Testicular hypofunction: Secondary | ICD-10-CM | POA: Diagnosis not present

## 2019-07-29 DIAGNOSIS — L249 Irritant contact dermatitis, unspecified cause: Secondary | ICD-10-CM | POA: Diagnosis not present

## 2019-08-05 DIAGNOSIS — E291 Testicular hypofunction: Secondary | ICD-10-CM | POA: Diagnosis not present

## 2019-08-12 ENCOUNTER — Other Ambulatory Visit: Payer: Self-pay

## 2019-08-12 ENCOUNTER — Other Ambulatory Visit: Payer: Self-pay | Admitting: Rheumatology

## 2019-08-12 DIAGNOSIS — Z79899 Other long term (current) drug therapy: Secondary | ICD-10-CM

## 2019-08-12 NOTE — Telephone Encounter (Signed)
Last Visit: 05/26/2019 Next Visit: 10/28/2019 Labs: 05/07/2019 White cell count is high because of steroid use. All other labs are stable.  Patient advised he is due to update labs. Patient states he will update this week.   Current Dose per office note on 05/26/2019: methotrexate 4 tablets by mouth on Saturdays and 4 tablets by mouth on Sundays   Okay to refill 30 day supply MTX?

## 2019-08-12 NOTE — Telephone Encounter (Signed)
ok 

## 2019-08-13 LAB — CBC WITH DIFFERENTIAL/PLATELET
Absolute Monocytes: 1119 cells/uL — ABNORMAL HIGH (ref 200–950)
Basophils Absolute: 66 cells/uL (ref 0–200)
Basophils Relative: 0.7 %
Eosinophils Absolute: 423 cells/uL (ref 15–500)
Eosinophils Relative: 4.5 %
HCT: 37.5 % — ABNORMAL LOW (ref 38.5–50.0)
Hemoglobin: 12.4 g/dL — ABNORMAL LOW (ref 13.2–17.1)
Lymphs Abs: 2218 cells/uL (ref 850–3900)
MCH: 30.8 pg (ref 27.0–33.0)
MCHC: 33.1 g/dL (ref 32.0–36.0)
MCV: 93.3 fL (ref 80.0–100.0)
MPV: 9.2 fL (ref 7.5–12.5)
Monocytes Relative: 11.9 %
Neutro Abs: 5574 cells/uL (ref 1500–7800)
Neutrophils Relative %: 59.3 %
Platelets: 441 10*3/uL — ABNORMAL HIGH (ref 140–400)
RBC: 4.02 10*6/uL — ABNORMAL LOW (ref 4.20–5.80)
RDW: 14.9 % (ref 11.0–15.0)
Total Lymphocyte: 23.6 %
WBC: 9.4 10*3/uL (ref 3.8–10.8)

## 2019-08-13 LAB — COMPLETE METABOLIC PANEL WITH GFR
AG Ratio: 1.3 (calc) (ref 1.0–2.5)
ALT: 6 U/L — ABNORMAL LOW (ref 9–46)
AST: 12 U/L (ref 10–35)
Albumin: 3.6 g/dL (ref 3.6–5.1)
Alkaline phosphatase (APISO): 62 U/L (ref 35–144)
BUN: 17 mg/dL (ref 7–25)
CO2: 28 mmol/L (ref 20–32)
Calcium: 9.1 mg/dL (ref 8.6–10.3)
Chloride: 102 mmol/L (ref 98–110)
Creat: 0.75 mg/dL (ref 0.70–1.11)
GFR, Est African American: 98 mL/min/{1.73_m2} (ref >=60)
GFR, Est Non African American: 84 mL/min/{1.73_m2} (ref >=60)
Globulin: 2.8 g/dL (calc) (ref 1.9–3.7)
Glucose, Bld: 73 mg/dL (ref 65–99)
Potassium: 4.3 mmol/L (ref 3.5–5.3)
Sodium: 137 mmol/L (ref 135–146)
Total Bilirubin: 0.6 mg/dL (ref 0.2–1.2)
Total Protein: 6.4 g/dL (ref 6.1–8.1)

## 2019-08-13 NOTE — Progress Notes (Signed)
Mild anemia noted which is a stable.  CMP is normal.

## 2019-08-20 ENCOUNTER — Other Ambulatory Visit: Payer: Self-pay

## 2019-08-20 ENCOUNTER — Emergency Department (HOSPITAL_COMMUNITY): Payer: Medicare Other

## 2019-08-20 ENCOUNTER — Emergency Department (HOSPITAL_COMMUNITY)
Admission: EM | Admit: 2019-08-20 | Discharge: 2019-08-21 | Disposition: A | Discharge status: Home / Self Care | Payer: Medicare Other | Attending: Emergency Medicine | Admitting: Emergency Medicine

## 2019-08-20 ENCOUNTER — Telehealth: Payer: Self-pay | Admitting: Pulmonary Disease

## 2019-08-20 ENCOUNTER — Encounter (HOSPITAL_COMMUNITY): Payer: Self-pay | Admitting: Emergency Medicine

## 2019-08-20 DIAGNOSIS — R911 Solitary pulmonary nodule: Secondary | ICD-10-CM

## 2019-08-20 DIAGNOSIS — Z20822 Contact with and (suspected) exposure to covid-19: Secondary | ICD-10-CM | POA: Diagnosis not present

## 2019-08-20 DIAGNOSIS — J449 Chronic obstructive pulmonary disease, unspecified: Secondary | ICD-10-CM | POA: Insufficient documentation

## 2019-08-20 DIAGNOSIS — C229 Malignant neoplasm of liver, not specified as primary or secondary: Secondary | ICD-10-CM | POA: Insufficient documentation

## 2019-08-20 DIAGNOSIS — J439 Emphysema, unspecified: Secondary | ICD-10-CM | POA: Diagnosis not present

## 2019-08-20 DIAGNOSIS — C3491 Malignant neoplasm of unspecified part of right bronchus or lung: Secondary | ICD-10-CM | POA: Diagnosis not present

## 2019-08-20 DIAGNOSIS — Z79899 Other long term (current) drug therapy: Secondary | ICD-10-CM | POA: Insufficient documentation

## 2019-08-20 DIAGNOSIS — Z87891 Personal history of nicotine dependence: Secondary | ICD-10-CM | POA: Insufficient documentation

## 2019-08-20 DIAGNOSIS — N200 Calculus of kidney: Secondary | ICD-10-CM | POA: Diagnosis not present

## 2019-08-20 DIAGNOSIS — C7931 Secondary malignant neoplasm of brain: Secondary | ICD-10-CM | POA: Diagnosis not present

## 2019-08-20 DIAGNOSIS — R4701 Aphasia: Secondary | ICD-10-CM | POA: Diagnosis present

## 2019-08-20 DIAGNOSIS — C799 Secondary malignant neoplasm of unspecified site: Secondary | ICD-10-CM

## 2019-08-20 DIAGNOSIS — R0781 Pleurodynia: Secondary | ICD-10-CM | POA: Diagnosis not present

## 2019-08-20 DIAGNOSIS — R52 Pain, unspecified: Secondary | ICD-10-CM

## 2019-08-20 LAB — URINALYSIS, ROUTINE W REFLEX MICROSCOPIC
Bilirubin Urine: NEGATIVE
Glucose, UA: NEGATIVE mg/dL
Hgb urine dipstick: NEGATIVE
Ketones, ur: 5 mg/dL — AB
Leukocytes,Ua: NEGATIVE
Nitrite: NEGATIVE
Protein, ur: NEGATIVE mg/dL
Specific Gravity, Urine: 1.026 (ref 1.005–1.030)
pH: 5 (ref 5.0–8.0)

## 2019-08-20 LAB — COMPREHENSIVE METABOLIC PANEL
ALT: 12 U/L (ref 0–44)
AST: 21 U/L (ref 15–41)
Albumin: 3.9 g/dL (ref 3.5–5.0)
Alkaline Phosphatase: 64 U/L (ref 38–126)
Anion gap: 12 (ref 5–15)
BUN: 32 mg/dL — ABNORMAL HIGH (ref 8–23)
CO2: 26 mmol/L (ref 22–32)
Calcium: 9.6 mg/dL (ref 8.9–10.3)
Chloride: 97 mmol/L — ABNORMAL LOW (ref 98–111)
Creatinine, Ser: 1.11 mg/dL (ref 0.61–1.24)
GFR calc Af Amer: >60 mL/min (ref >60)
GFR calc non Af Amer: >60 mL/min (ref >60)
Glucose, Bld: 106 mg/dL — ABNORMAL HIGH (ref 70–99)
Potassium: 4.7 mmol/L (ref 3.5–5.1)
Sodium: 135 mmol/L (ref 135–145)
Total Bilirubin: 1 mg/dL (ref 0.3–1.2)
Total Protein: 8 g/dL (ref 6.5–8.1)

## 2019-08-20 LAB — CBC WITH DIFFERENTIAL/PLATELET
Abs Immature Granulocytes: 0.09 10*3/uL — ABNORMAL HIGH (ref 0.00–0.07)
Basophils Absolute: 0.1 10*3/uL (ref 0.0–0.1)
Basophils Relative: 1 %
Eosinophils Absolute: 0.1 10*3/uL (ref 0.0–0.5)
Eosinophils Relative: 1 %
HCT: 41.2 % (ref 39.0–52.0)
Hemoglobin: 13 g/dL (ref 13.0–17.0)
Immature Granulocytes: 1 %
Lymphocytes Relative: 9 %
Lymphs Abs: 1.3 10*3/uL (ref 0.7–4.0)
MCH: 30.7 pg (ref 26.0–34.0)
MCHC: 31.6 g/dL (ref 30.0–36.0)
MCV: 97.4 fL (ref 80.0–100.0)
Monocytes Absolute: 1 10*3/uL (ref 0.1–1.0)
Monocytes Relative: 7 %
Neutro Abs: 11.7 10*3/uL — ABNORMAL HIGH (ref 1.7–7.7)
Neutrophils Relative %: 81 %
Platelets: 474 10*3/uL — ABNORMAL HIGH (ref 150–400)
RBC: 4.23 MIL/uL (ref 4.22–5.81)
RDW: 15.6 % — ABNORMAL HIGH (ref 11.5–15.5)
WBC: 14.2 10*3/uL — ABNORMAL HIGH (ref 4.0–10.5)
nRBC: 0 % (ref 0.0–0.2)

## 2019-08-20 LAB — SARS CORONAVIRUS 2 BY RT PCR (HOSPITAL ORDER, PERFORMED IN ~~LOC~~ HOSPITAL LAB): SARS Coronavirus 2: NEGATIVE

## 2019-08-20 MED ORDER — IOHEXOL 300 MG/ML  SOLN
100.0000 mL | Freq: Once | INTRAMUSCULAR | Status: AC | PRN
  Administered 2019-08-20: 100 mL via INTRAVENOUS

## 2019-08-20 MED ORDER — SODIUM CHLORIDE (PF) 0.9 % IJ SOLN
INTRAMUSCULAR | Status: AC
  Filled 2019-08-20: qty 50

## 2019-08-20 MED ORDER — IOHEXOL 300 MG/ML  SOLN: 75.0000 mL | Freq: Once | INTRAMUSCULAR | Status: DC | PRN

## 2019-08-20 MED ORDER — IOHEXOL 9 MG/ML PO SOLN
500.0000 mL | ORAL | Status: AC
  Administered 2019-08-20 (×2): 500 mL via ORAL

## 2019-08-20 NOTE — Telephone Encounter (Signed)
Called and spoke with pt's daughter Ryan Coffey letting her know that TP also stated that pt needed to go to the ER via EMS ASAP. She verbalized understanding and stated that pt was adamant of not going to the ER as last time he went there, they wanted to admit him so he ended up checking himself out AMA.  Ryan Coffey stated that they called PCP and have scheduled an appt today to be seen. She stated if PCP states that pt needs to be seen at ED, maybe he would listen to him as he is not wanting to listen to anyone else in regards to recommendations. Ryan Coffey asked if pt could have a cxr performed to see what that showed and I stated that they could do the cxr in the ED. She verbalized understanding. Nothing further needed.

## 2019-08-20 NOTE — Telephone Encounter (Signed)
I agree new onset slurred speech is worrisome for stroke . Needs to go to ER ASAP , call 911 .

## 2019-08-20 NOTE — ED Triage Notes (Signed)
Per pt/family, daughter states she came back from the beach and noticed dad had slurred speech-states slight left facial droop-states her dad has been more lethargic, going to bed much earlier than normal, gait unsteady-patient states he noticed speech a couple of days ago

## 2019-08-20 NOTE — ED Provider Notes (Signed)
[place] [facility] DEPT Provider Note   CSN: [id number] Arrival date & time: 08/20/19  1435     History Chief Complaint  Patient presents with  . Aphasia    Ryan Coffey is a 86 y.o. male.  HPI Patient presents for evaluation of sore speech, confusion and weight loss.  Onset of symptoms between weeks and months ago.  Worsening since his daughter returned from the beach in the last couple of days.  Patient currently lives alone.  He has had about a 15 pound weight loss.  This occurred over the last couple months.  He had a history of pneumonia, last year.  He is not currently having cough, vomiting, blurred vision, focal weakness or paresthesia.  There are no other known modifying factors.    Past Medical History:  Diagnosis Date  . Arthritis   . Benign tumor of pituitary gland ([place])    "wasn't able to get it all"  . COPD (chronic obstructive pulmonary disease) ([place])   . Pneumonia 06/27/11   "first time"  . Shingles     Patient Active Problem List   Diagnosis Date Noted  . Abnormal findings on diagnostic imaging of lung 10/07/2018  . Dyspnea on exertion 10/07/2018  . Pneumonia of right lower lobe due to infectious organism 10/07/2018  . Chronic respiratory failure with hypoxia ([place]) 10/07/2018  . Pressure injury of skin 09/22/2018  . HAP (hospital-acquired pneumonia) 09/18/2018  . Immunocompromised state ([place]) 09/18/2018  . Other emphysema ([place]) 08/29/2018  . Unintentional weight loss 06/18/2018  . Chronic cough 06/18/2018  . COPD (chronic obstructive pulmonary disease) ([place]) 08/29/2016  . Rheumatoid nodulosis ([place]) 08/24/2016  . High risk medication use 08/24/2016  . Thrombocytosis ([place]) 08/24/2016  . Elevated LFTs mild elevation of ALT  08/24/2016  . History of renal cell carcinoma 08/24/2016  . History of prostate cancer 08/24/2016  . History of adrenal insufficiency 08/24/2016  . HCAP (healthcare-associated pneumonia) 06/27/2011     Class: Acute  . Seropositive rheumatoid arthritis ([place]) 06/27/2011  . Panhypopituitarism ([place]) 06/27/2011  . Degenerative joint disease 06/27/2011  . History of tobacco use 06/27/2011  . Hyperlipidemia 06/27/2011    Past Surgical History:  Procedure Laterality Date  . CHOLECYSTECTOMY OPEN  2010  . KIDNEY SURGERY  2011   "lesion removed right side"  . RENAL BIOPSY  2012   right  . TRANSPHENOIDAL / TRANSNASAL HYPOPHYSECTOMY / RESECTION PITUITARY TUMOR  ~ 1979   "they couldn't get it all"       Family History  Problem Relation Age of Onset  . Heart disease Mother   . Cancer Brother   . Heart disease Brother     Social History   Tobacco Use  . Smoking status: Former Smoker    Packs/day: 0.50    Years: 10.00    Pack years: 5.00    Types: Cigarettes    Quit date: 08/03/1980    Years since quitting: 39.0  . Smokeless tobacco: Never Used  Vaping Use  . Vaping Use: Never used  Substance Use Topics  . Alcohol use: Yes    Comment: wine occ  . Drug use: No    Home Medications Prior to Admission medications   Medication Sig Start Date End Date Taking? Authorizing Provider  acetaminophen (TYLENOL) 325 MG tablet Take 650 mg by mouth 2 (two) times daily as needed (arthritis).   Yes [provider]  bromocriptine (PARLODEL) 2.5 MG tablet Take 2.5 mg by mouth 2 (two) times  daily.   Yes [provider]  folic acid (FOLVITE) 1 MG tablet TAKE 2 TABLETS EVERY DAY Patient taking differently: Take 2 mg by mouth daily.  08/04/18  Yes Deveshwar, Abel Presto, MD  hydrocortisone (CORTEF) 20 MG tablet Take 10-20 mg by mouth daily. 20 mg in the morning and 10mg  at night 11/02/16  Yes [provider]  levothyroxine (SYNTHROID) 150 MCG tablet Take 150 mcg by mouth daily before breakfast.  07/29/19  Yes [provider]  methotrexate (RHEUMATREX) 2.5 MG tablet TAKE 4 TABLETS ON SATURDAY AND 4 TABLETS ON SUNDAY. CAUTION:CHEMOTHERAPY. PROTECT FROM LIGHT. Patient taking  differently: Take 10 mg by mouth 2 (two) times a week. TAKE 4 tablets on Saturday and 4 tablets on Sunday. Caution:Chemotherapy. Protect from light. 08/12/19   Bo Merino, MD  pantoprazole (PROTONIX) 40 MG tablet Take 1 tablet (40 mg total) by mouth daily. 09/25/18  Yes Emokpae, Courage, MD  pravastatin (PRAVACHOL) 20 MG tablet Take 20 mg by mouth daily.  07/24/19  Yes [provider]  PROTONIX 20 MG tablet Take 20 mg by mouth daily.  07/29/19  Yes [provider]  testosterone cypionate (DEPOTESTOSTERONE CYPIONATE) 200 MG/ML injection Inject 75 mg into the muscle every 14 (fourteen) days.  05/24/16  Yes [provider]  zolpidem (AMBIEN CR) 6.25 MG CR tablet Take 1 tablet (6.25 mg total) by mouth at bedtime as needed for sleep. Patient taking differently: Take 6.25 mg by mouth at bedtime.  05/20/18  Yes Adhikari, Tamsen Meek, MD  guaiFENesin-dextromethorphan (ROBITUSSIN DM) 100-10 MG/5ML syrup Take 10 mLs by mouth every 4 (four) hours as needed for cough. Patient not taking: Reported on 08/20/2019 09/25/18   Roxan Hockey, MD    Allergies    Patient has no known allergies.  Review of Systems   Review of Systems  All other systems reviewed and are negative.   Physical Exam Updated Vital Signs BP (!) 158/109   Pulse 84   Temp 98.3 F (36.8 C) (Oral)   Resp 18   SpO2 96%   Physical Exam Vitals and nursing note reviewed.  Constitutional:      General: He is not in acute distress.    Appearance: He is well-developed. He is ill-appearing. He is not toxic-appearing or diaphoretic.     Comments: Frail  HENT:     Head: Normocephalic and atraumatic.     Right Ear: External ear normal.     Left Ear: External ear normal.  Eyes:     Conjunctiva/sclera: Conjunctivae normal.     Pupils: Pupils are equal, round, and reactive to light.  Neck:     Trachea: Phonation normal.  Cardiovascular:     Rate and Rhythm: Normal rate and regular rhythm.     Heart sounds: Normal  heart sounds.  Pulmonary:     Effort: Pulmonary effort is normal.     Breath sounds: Normal breath sounds.  Abdominal:     Palpations: Abdomen is soft.     Tenderness: There is no abdominal tenderness.  Musculoskeletal:        General: Normal range of motion.     Cervical back: Normal range of motion and neck supple.     Comments: Normal strength arms and legs bilaterally  Skin:    General: Skin is warm and dry.  Neurological:     Mental Status: He is alert and oriented to person, place, and time.     Cranial Nerves: No cranial nerve deficit.     Sensory:  No sensory deficit.     Motor: No abnormal muscle tone.     Coordination: Coordination normal.     Comments: No dysarthria or aphasia.  Psychiatric:        Mood and Affect: Mood normal.        Behavior: Behavior normal.        Thought Content: Thought content normal.        Judgment: Judgment normal.     ED Results / Procedures / Treatments   Labs (all labs ordered are listed, but only abnormal results are displayed) Labs Reviewed  COMPREHENSIVE METABOLIC PANEL - Abnormal; Notable for the following components:      Result Value   Chloride 97 (*)    Glucose, Bld 106 (*)    BUN 32 (*)    All other components within normal limits  CBC WITH DIFFERENTIAL/PLATELET - Abnormal; Notable for the following components:   WBC 14.2 (*)    RDW 15.6 (*)    Platelets 474 (*)    Neutro Abs 11.7 (*)    Abs Immature Granulocytes 0.09 (*)    All other components within normal limits  URINALYSIS, ROUTINE W REFLEX MICROSCOPIC - Abnormal; Notable for the following components:   Ketones, ur 5 (*)    All other components within normal limits  SARS CORONAVIRUS 2 BY RT PCR [facility] ORDER, [place] LAB)    EKG EKG Interpretation  Date/Time:  Thursday August 20 2019 15:28:55 EDT Ventricular Rate:  71 PR Interval:    QRS Duration: 77 QT Interval:  379 QTC Calculation: 412 R Axis:   71 Text Interpretation: Sinus  rhythm Since last tracing rate slower Confirmed by Daleen Bo [phone] on 08/20/2019 4:01:57 PM   Radiology DG Chest 2 View  Result Date: 08/20/2019 CLINICAL DATA:  Cough, left rib pain for 2 weeks EXAM: CHEST - 2 VIEW COMPARISON:  10/07/2018 FINDINGS: Frontal and lateral views of the chest demonstrate a stable cardiac silhouette. Persistent areas of scarring are seen within the superior segment right lower lobe. No new airspace disease, effusion, or pneumothorax. Significant background emphysema. Severe right shoulder osteoarthritis. IMPRESSION: 1. Stable scarring within the right lower lobe. 2. Emphysema. 3. No acute process. Electronically Signed   By: Randa Ngo M.D.   On: 08/20/2019 16:32   DG Ribs Unilateral Left  Result Date: 08/20/2019 CLINICAL DATA:  Cough, left rib pain for 2 weeks EXAM: LEFT RIBS - 2 VIEW COMPARISON:  10/07/2018 FINDINGS: Frontal and oblique views of the left thoracic cage are obtained. There are no acute displaced rib fractures. Visualized portions of the left lung are clear. Chronic scarring within the right lower lobe again noted. IMPRESSION: 1. No acute displaced fracture. Electronically Signed   By: Randa Ngo M.D.   On: 08/20/2019 16:34   CT Head Wo Contrast  Result Date: 08/20/2019 CLINICAL DATA:  Slurred speech and left facial droop.  Lethargy. EXAM: CT HEAD WITHOUT CONTRAST TECHNIQUE: Contiguous axial images were obtained from the base of the skull through the vertex without intravenous contrast. COMPARISON:  None. FINDINGS: Brain: Multiple brain masses disseminated throughout the cerebellum and both cerebral hemispheres consistent with advanced intracranial metastatic disease. Most of these are associated with petechial blood products. No frank hematoma. Many of the lesions are associated with vasogenic edema. There is no midline shift. No hydrocephalus or extra-axial collection. Index left cerebellar lesion image 8 measures 14 mm. Index right temporal  lesion image 13 measures 28 mm. Index left occipital  lesion image 16 measures 23 mm. Index left frontal lesion image 16 measures 28 mm. Index right frontoparietal lesion image 20 for measures 17 mm. Vascular: There is atherosclerotic calcification of the major vessels at the base of the brain. Skull: Previous trans-sphenoidal surgery. No lytic calvarial lesion. Sinuses/Orbits: Clear/normal Other: None IMPRESSION: Advanced widespread metastatic disease throughout the brain. In total, there are probably 40-50 lesions. Most are associated with petechial blood products. Many are associated with vasogenic edema. No midline shift. No hydrocephalus. Lung cancer is the most common primary tumor to present with this pattern. Distant trans-sphenoidal pituitary surgery. Electronically Signed   By: Nelson Chimes M.D.   On: 08/20/2019 15:48   CT Chest W Contrast  Result Date: 08/20/2019 CLINICAL DATA:  Altered level of consciousness, diffuse intracranial metastases with unknown primary malignancy EXAM: CT CHEST, ABDOMEN, AND PELVIS WITH CONTRAST TECHNIQUE: Multidetector CT imaging of the chest, abdomen and pelvis was performed following the standard protocol during bolus administration of intravenous contrast. CONTRAST:  163mL OMNIPAQUE IOHEXOL 300 MG/ML  SOLN COMPARISON:  09/22/2018 FINDINGS: CT CHEST FINDINGS Cardiovascular: Heart and great vessels are unremarkable without pericardial effusion. Moderate atherosclerosis of the aorta and coronary vessels. Mediastinum/Nodes: No enlarged mediastinal, hilar, or axillary lymph nodes. Thyroid gland, trachea, and esophagus demonstrate no significant findings. Lungs/Pleura: Extensive background emphysema again noted. The cavitating pneumonia within the right lower lobe on prior study has resolved, with resulting scarring within the superior segment right lower lobe. There is a new rounded somewhat spiculated mass within the periphery of the right upper lobe, measuring 2.6 x 3.8 cm  on image 26 of series 2. No effusion or pneumothorax. Central airways are patent. Musculoskeletal: There are no acute or destructive bony lesions. Reconstructed images demonstrate no additional findings. CT ABDOMEN PELVIS FINDINGS Hepatobiliary: There is decreased attenuation throughout the majority of the liver parenchyma consistent with hepatic steatosis. Higher attenuation within segment 7 likely reflects fatty sparing. No mass effect. The gallbladder is surgically absent. Pancreas: Unremarkable. No pancreatic ductal dilatation or surrounding inflammatory changes. Spleen: Normal in size without focal abnormality. Adrenals/Urinary Tract: 5 mm nonobstructing right renal calculus. There are numerous bilateral renal cortical cysts. The adrenals are unremarkable. Bladder is decompressed, which limits evaluation. Stomach/Bowel: No bowel obstruction or ileus. Diffuse diverticulosis of the distal colon without diverticulitis. No bowel wall thickening or inflammatory change. Vascular/Lymphatic: Extensive atherosclerosis of the abdominal aorta. There is aneurysmal dilatation of the right common iliac artery measuring up to 2.8 cm. No pathologic adenopathy. Reproductive: Prostate is unremarkable. Other: No free fluid or free gas. No abdominal wall hernia. Musculoskeletal: No acute or destructive bony lesions. Reconstructed images demonstrate no additional findings. IMPRESSION: 1. New rounded somewhat spiculated mass within the periphery of the right upper lobe, measuring 2.6 x 3.8 cm, concerning for malignancy. PET-CT may be useful for further evaluation. 2. Resolution of the previously seen cavitating pneumonia within the right lower lobe, with resulting scarring. 3. Hepatic steatosis, with fatty sparing in the posterior right lobe. 4. A 5 mm nonobstructing right renal calculus. 5. Aneurysmal dilatation of the right common iliac artery measuring up to 2.8 cm. 6. Aortic Atherosclerosis (ICD10-I70.0) and Emphysema  (ICD10-J43.9). Electronically Signed   By: Randa Ngo M.D.   On: 08/20/2019 23:28   CT Abdomen Pelvis W Contrast  Result Date: 08/20/2019 CLINICAL DATA:  Altered level of consciousness, diffuse intracranial metastases with unknown primary malignancy EXAM: CT CHEST, ABDOMEN, AND PELVIS WITH CONTRAST TECHNIQUE: Multidetector CT imaging of the chest, abdomen and pelvis was  performed following the standard protocol during bolus administration of intravenous contrast. CONTRAST:  120mL OMNIPAQUE IOHEXOL 300 MG/ML  SOLN COMPARISON:  09/22/2018 FINDINGS: CT CHEST FINDINGS Cardiovascular: Heart and great vessels are unremarkable without pericardial effusion. Moderate atherosclerosis of the aorta and coronary vessels. Mediastinum/Nodes: No enlarged mediastinal, hilar, or axillary lymph nodes. Thyroid gland, trachea, and esophagus demonstrate no significant findings. Lungs/Pleura: Extensive background emphysema again noted. The cavitating pneumonia within the right lower lobe on prior study has resolved, with resulting scarring within the superior segment right lower lobe. There is a new rounded somewhat spiculated mass within the periphery of the right upper lobe, measuring 2.6 x 3.8 cm on image 26 of series 2. No effusion or pneumothorax. Central airways are patent. Musculoskeletal: There are no acute or destructive bony lesions. Reconstructed images demonstrate no additional findings. CT ABDOMEN PELVIS FINDINGS Hepatobiliary: There is decreased attenuation throughout the majority of the liver parenchyma consistent with hepatic steatosis. Higher attenuation within segment 7 likely reflects fatty sparing. No mass effect. The gallbladder is surgically absent. Pancreas: Unremarkable. No pancreatic ductal dilatation or surrounding inflammatory changes. Spleen: Normal in size without focal abnormality. Adrenals/Urinary Tract: 5 mm nonobstructing right renal calculus. There are numerous bilateral renal cortical cysts. The  adrenals are unremarkable. Bladder is decompressed, which limits evaluation. Stomach/Bowel: No bowel obstruction or ileus. Diffuse diverticulosis of the distal colon without diverticulitis. No bowel wall thickening or inflammatory change. Vascular/Lymphatic: Extensive atherosclerosis of the abdominal aorta. There is aneurysmal dilatation of the right common iliac artery measuring up to 2.8 cm. No pathologic adenopathy. Reproductive: Prostate is unremarkable. Other: No free fluid or free gas. No abdominal wall hernia. Musculoskeletal: No acute or destructive bony lesions. Reconstructed images demonstrate no additional findings. IMPRESSION: 1. New rounded somewhat spiculated mass within the periphery of the right upper lobe, measuring 2.6 x 3.8 cm, concerning for malignancy. PET-CT may be useful for further evaluation. 2. Resolution of the previously seen cavitating pneumonia within the right lower lobe, with resulting scarring. 3. Hepatic steatosis, with fatty sparing in the posterior right lobe. 4. A 5 mm nonobstructing right renal calculus. 5. Aneurysmal dilatation of the right common iliac artery measuring up to 2.8 cm. 6. Aortic Atherosclerosis (ICD10-I70.0) and Emphysema (ICD10-J43.9). Electronically Signed   By: Randa Ngo M.D.   On: 08/20/2019 23:28    Procedures .Critical Care Performed by: Daleen Bo, MD Authorized by: Daleen Bo, MD   Critical care provider statement:    Critical care time (minutes):  35   Critical care start time:  08/20/2019 3:20 PM   Critical care end time:  08/20/2019 11:57 PM   Critical care time was exclusive of:  Separately billable procedures and treating other patients   Critical care was necessary to treat or prevent imminent or life-threatening deterioration of the following conditions: Cancer.   Critical care was time spent personally by me on the following activities:  Blood draw for specimens, development of treatment plan with patient or surrogate,  discussions with consultants, evaluation of patient's response to treatment, examination of patient, obtaining history from patient or surrogate, ordering and performing treatments and interventions, ordering and review of laboratory studies, pulse oximetry, re-evaluation of patient's condition, review of old charts and ordering and review of radiographic studies   (including critical care time)  Medications Ordered in ED Medications  sodium chloride (PF) 0.9 % injection (has no administration in time range)  iohexol (OMNIPAQUE) 9 MG/ML oral solution 500 mL (500 mLs Oral Contrast Given 08/20/19 2200)  iohexol (  OMNIPAQUE) 300 MG/ML solution 100 mL (100 mLs Intravenous Contrast Given 08/20/19 2257)    ED Course  I have reviewed the triage vital signs and the nursing notes.  Pertinent labs & imaging results that were available during my care of the patient were reviewed by me and considered in my medical decision making (see chart for details).  Clinical Course as of Aug 20 2355  Thu Aug 20, 2019  2005 Normal  Urinalysis, Routine w reflex microscopic(!) [EW]  2005 Normal except white count high, platelets high, neutrophils elevated  CBC with Differential(!) [EW]  2005 Normal except chloride low, glucose high, BUN high  Comprehensive metabolic panel(!) [EW]  1443 No fracture or lytic lesions  DG Ribs Unilateral Left [EW]  2156 Normal  SARS Coronavirus 2 by RT PCR (hospital order, performed in [facility] hospital lab) Nasopharyngeal Nasopharyngeal Swab [EW]  2240 Scarring upper aspect of the lower leg, right.  Interpreted by radiologist  DG Chest 2 View [EW]  2335 CT scan chest abdomen pelvis done to evaluate for possible lung exam metastases, interpreted by radiologist.  Evident new right lung spiculated mass consistent with cancer.   [EW]  2354 Lymphocytes: 9 [EW]    Clinical Course User Index [EW] Daleen Bo, MD   MDM Rules/Calculators/A&P                           Patient  Vitals for the past 24 hrs:  BP Temp Temp src Pulse Resp SpO2  08/20/19 2245 (!) 158/109 -- -- 84 18 96 %  08/20/19 2030 122/67 -- -- 81 (!) 26 96 %  08/20/19 1901 (!) 141/85 -- -- 75 16 95 %  08/20/19 1600 121/68 -- -- 69 (!) 25 96 %  08/20/19 1447 103/67 98.3 F (36.8 C) Oral 77 17 96 %    11:54 PM Reevaluation with update and discussion. After initial assessment and treatment, an updated evaluation reveals no change in clinical status, findings discussed with patient and daughter, all questions answered. Daleen Bo   Medical Decision Making:  This patient is presenting for evaluation of confusion and weight loss, which does require a range of treatment options, and is a complaint that involves a high risk of morbidity and mortality. The differential diagnoses include stroke, encephalitis, acute illness. I decided to review old records, and in summary elderly male with some chronic and acute symptoms, nonspecific nature.  I obtained additional historical information from his daughter at the bedside.  Clinical Laboratory Tests Ordered, included CBC, Metabolic panel and Urinalysis. Review indicates mild elevation white blood cell, and platelets.  Essentially normal.  Metabolic panel except elevated BUN.  Normal urinalysis. Radiologic Tests Ordered, included chest x-ray, CT head, CT chest abdomen and pelvis.  I independently Visualized: Plain and CT images, which show probable metastatic cancer right lung primary to brain.  Abdomen pelvis without evident metastases  Cardiac Monitor Tracing which shows normal sinus rhythm    Critical Interventions-clinical evaluation, laboratory testing, serial CT imaging, plain x-ray chest, observation reassessment  After These Interventions, the Patient was reevaluated and was found to have probable right lung cancer, metastatic to brain.  Screening for metabolic processes negative.  No acute toxicity or encephalitis.  No indication for hospitalization  at this time.  CRITICAL CARE-yes Performed by: Daleen Bo  Nursing Notes Reviewed/ Care Coordinated Applicable Imaging Reviewed Interpretation of Laboratory Data incorporated into ED treatment  The patient appears reasonably screened and/or stabilized for discharge  and I doubt any other medical condition or other [facility] requiring further screening, evaluation, or treatment in the ED at this time prior to discharge.  Plan: Home Medications-continue usual; Home Treatments-rest, fluids; return here if the recommended treatment, does not improve the symptoms; Recommended follow up-PCP tomorrow as scheduled, contact oncology service for follow-up as soon as possible.     Final Clinical Impression(s) / ED Diagnoses Final diagnoses:  Pain  Malignant neoplasm of right lung, unspecified part of lung ([place])  Metastatic malignant neoplasm, unspecified site [facility])    Rx / [place] Orders ED Discharge Orders    None       Daleen Bo, MD 08/20/19 2357

## 2019-08-20 NOTE — ED Notes (Signed)
Called lab to check on status of blood work- they are running it now.

## 2019-08-20 NOTE — Discharge Instructions (Addendum)
The testing today indicates that you probably have a lung cancer on the right side, and metastatic cancer in the brain.  You will have to see the oncologist and perhaps another doctors, to help define exactly the type of cancer that you have and determine which treatment is appropriate.  It will help to follow-up with Dr. Dagmar Hait tomorrow as scheduled for further evaluation and treatment.  He can also help you work through this process.

## 2019-08-20 NOTE — Telephone Encounter (Signed)
Spoke with Colletta [place], patient's daughter. She is concerned that her father's PNA is returning. He had PNA 4 times last year and was in and out of the hospital. She stated that he woke up this morning with slurred speech. This is a new symptom for her. She also stated that he is not using his inhalers because "they hurt his eyes". He has no appetite and a lost a lot of weight within the past few weeks. He has a productive cough with clear phlegm which is not new for him. He is also unsteady on his feet.   I advised her that based on his history and new onset slurred speech that it may be best for him to go the hospital via EMS. She wanted to know if we could just place an order for him to have a CXR today. She does not want to take him to the ER in fear that he may have to sit in the lobby all day. I explained to her that once they are aware that he has slurred speech as a new symptom, they should be able to take him back immediately. She would like to know what the provider would recommend.   TP, can you please advise since Dr. Vaughan Browner and Aaron Edelman are both unavailable today? Thanks!

## 2019-08-20 NOTE — ED Notes (Signed)
Patient has a urine culture in the main lab 

## 2019-08-20 NOTE — Telephone Encounter (Signed)
Called and spoke with pt's daughter Colletta [place]. She stated that they were currently at [facility] ED and when they got there, pt was taken straight back due to having the new onset of slurred speech.  While speaking to [place], she stated that the RN just came back into the room. She stated that she would keep [name] posted on pt.  Routing to both TP as she was the AOD who previous message was sent to this morning by Cherina and Dr. Vaughan Browner as he is pt's main pulmonologist.

## 2019-08-21 ENCOUNTER — Other Ambulatory Visit: Payer: Self-pay | Admitting: Radiation Therapy

## 2019-08-21 ENCOUNTER — Telehealth: Payer: Self-pay | Admitting: Internal Medicine

## 2019-08-21 NOTE — Addendum Note (Signed)
Addended by: Valerie Salts on: 08/21/2019 04:31 PM   Modules accepted: Orders

## 2019-08-21 NOTE — Telephone Encounter (Signed)
Order has been placed for PET. Will keep message in triage to follow on when PET is scheduled so we can get him scheduled for an OV afterwards.

## 2019-08-21 NOTE — Telephone Encounter (Signed)
Ryan Coffey has been scheduled to see [name] on 6/24 at [place] date and time has been given to the pt's daughter. Aware to arrive 15 minutes early.

## 2019-08-21 NOTE — Telephone Encounter (Signed)
I have reviewed the ED notes that shows abnormal CT suggestive of metastatic cancer. Please order PET CT and make follow up appointment in office at next available with me or APP to discuss options for biopsy

## 2019-08-24 ENCOUNTER — Ambulatory Visit
Admit: 2019-08-24 | Discharge: 2019-08-24 | Disposition: A | Discharge status: Home / Self Care | Payer: Medicare Other | Source: Ambulatory Visit | Attending: Radiation Oncology | Admitting: Radiation Oncology

## 2019-08-24 ENCOUNTER — Inpatient Hospital Stay (HOSPITAL_COMMUNITY)
Admission: EM | Admit: 2019-08-24 | Discharge: 2019-08-28 | DRG: 054 | Disposition: A | Discharge status: Skilled Nursing Facility | Payer: Medicare Other | Attending: Internal Medicine | Admitting: Internal Medicine

## 2019-08-24 ENCOUNTER — Inpatient Hospital Stay (HOSPITAL_COMMUNITY): Payer: Medicare Other

## 2019-08-24 ENCOUNTER — Encounter (HOSPITAL_COMMUNITY): Payer: Self-pay | Admitting: Emergency Medicine

## 2019-08-24 ENCOUNTER — Emergency Department (HOSPITAL_COMMUNITY): Payer: Medicare Other

## 2019-08-24 ENCOUNTER — Other Ambulatory Visit: Payer: Self-pay

## 2019-08-24 DIAGNOSIS — R0902 Hypoxemia: Secondary | ICD-10-CM

## 2019-08-24 DIAGNOSIS — Z8546 Personal history of malignant neoplasm of prostate: Secondary | ICD-10-CM

## 2019-08-24 DIAGNOSIS — R6510 Systemic inflammatory response syndrome (SIRS) of non-infectious origin without acute organ dysfunction: Secondary | ICD-10-CM | POA: Diagnosis not present

## 2019-08-24 DIAGNOSIS — M6281 Muscle weakness (generalized): Secondary | ICD-10-CM | POA: Diagnosis not present

## 2019-08-24 DIAGNOSIS — Z905 Acquired absence of kidney: Secondary | ICD-10-CM

## 2019-08-24 DIAGNOSIS — C787 Secondary malignant neoplasm of liver and intrahepatic bile duct: Secondary | ICD-10-CM | POA: Diagnosis not present

## 2019-08-24 DIAGNOSIS — C801 Malignant (primary) neoplasm, unspecified: Secondary | ICD-10-CM | POA: Diagnosis not present

## 2019-08-24 DIAGNOSIS — C7801 Secondary malignant neoplasm of right lung: Secondary | ICD-10-CM | POA: Diagnosis not present

## 2019-08-24 DIAGNOSIS — Z85528 Personal history of other malignant neoplasm of kidney: Secondary | ICD-10-CM

## 2019-08-24 DIAGNOSIS — M059 Rheumatoid arthritis with rheumatoid factor, unspecified: Secondary | ICD-10-CM | POA: Diagnosis present

## 2019-08-24 DIAGNOSIS — Z79890 Hormone replacement therapy (postmenopausal): Secondary | ICD-10-CM | POA: Diagnosis not present

## 2019-08-24 DIAGNOSIS — Z6821 Body mass index (BMI) 21.0-21.9, adult: Secondary | ICD-10-CM | POA: Diagnosis not present

## 2019-08-24 DIAGNOSIS — R54 Age-related physical debility: Secondary | ICD-10-CM | POA: Diagnosis present

## 2019-08-24 DIAGNOSIS — K760 Fatty (change of) liver, not elsewhere classified: Secondary | ICD-10-CM | POA: Diagnosis present

## 2019-08-24 DIAGNOSIS — E291 Testicular hypofunction: Secondary | ICD-10-CM | POA: Diagnosis not present

## 2019-08-24 DIAGNOSIS — C3411 Malignant neoplasm of upper lobe, right bronchus or lung: Secondary | ICD-10-CM | POA: Diagnosis not present

## 2019-08-24 DIAGNOSIS — D75839 Thrombocytosis, unspecified: Secondary | ICD-10-CM | POA: Diagnosis present

## 2019-08-24 DIAGNOSIS — J9601 Acute respiratory failure with hypoxia: Secondary | ICD-10-CM | POA: Diagnosis present

## 2019-08-24 DIAGNOSIS — Z79899 Other long term (current) drug therapy: Secondary | ICD-10-CM

## 2019-08-24 DIAGNOSIS — M2550 Pain in unspecified joint: Secondary | ICD-10-CM | POA: Diagnosis not present

## 2019-08-24 DIAGNOSIS — R1313 Dysphagia, pharyngeal phase: Secondary | ICD-10-CM | POA: Diagnosis not present

## 2019-08-24 DIAGNOSIS — R042 Hemoptysis: Secondary | ICD-10-CM | POA: Diagnosis not present

## 2019-08-24 DIAGNOSIS — Z7189 Other specified counseling: Secondary | ICD-10-CM | POA: Diagnosis not present

## 2019-08-24 DIAGNOSIS — K219 Gastro-esophageal reflux disease without esophagitis: Secondary | ICD-10-CM | POA: Diagnosis not present

## 2019-08-24 DIAGNOSIS — Z87891 Personal history of nicotine dependence: Secondary | ICD-10-CM

## 2019-08-24 DIAGNOSIS — R64 Cachexia: Secondary | ICD-10-CM | POA: Diagnosis present

## 2019-08-24 DIAGNOSIS — E230 Hypopituitarism: Secondary | ICD-10-CM | POA: Diagnosis present

## 2019-08-24 DIAGNOSIS — E785 Hyperlipidemia, unspecified: Secondary | ICD-10-CM | POA: Diagnosis present

## 2019-08-24 DIAGNOSIS — R509 Fever, unspecified: Secondary | ICD-10-CM | POA: Diagnosis not present

## 2019-08-24 DIAGNOSIS — E871 Hypo-osmolality and hyponatremia: Secondary | ICD-10-CM | POA: Diagnosis present

## 2019-08-24 DIAGNOSIS — R2681 Unsteadiness on feet: Secondary | ICD-10-CM | POA: Diagnosis not present

## 2019-08-24 DIAGNOSIS — D473 Essential (hemorrhagic) thrombocythemia: Secondary | ICD-10-CM | POA: Diagnosis not present

## 2019-08-24 DIAGNOSIS — M1990 Unspecified osteoarthritis, unspecified site: Secondary | ICD-10-CM | POA: Diagnosis present

## 2019-08-24 DIAGNOSIS — G936 Cerebral edema: Secondary | ICD-10-CM | POA: Diagnosis present

## 2019-08-24 DIAGNOSIS — Z66 Do not resuscitate: Secondary | ICD-10-CM | POA: Diagnosis present

## 2019-08-24 DIAGNOSIS — F5104 Psychophysiologic insomnia: Secondary | ICD-10-CM | POA: Diagnosis not present

## 2019-08-24 DIAGNOSIS — J189 Pneumonia, unspecified organism: Secondary | ICD-10-CM | POA: Diagnosis not present

## 2019-08-24 DIAGNOSIS — J449 Chronic obstructive pulmonary disease, unspecified: Secondary | ICD-10-CM | POA: Diagnosis present

## 2019-08-24 DIAGNOSIS — E039 Hypothyroidism, unspecified: Secondary | ICD-10-CM | POA: Diagnosis not present

## 2019-08-24 DIAGNOSIS — K7689 Other specified diseases of liver: Secondary | ICD-10-CM | POA: Diagnosis not present

## 2019-08-24 DIAGNOSIS — R41841 Cognitive communication deficit: Secondary | ICD-10-CM | POA: Diagnosis not present

## 2019-08-24 DIAGNOSIS — R7989 Other specified abnormal findings of blood chemistry: Secondary | ICD-10-CM | POA: Diagnosis present

## 2019-08-24 DIAGNOSIS — R918 Other nonspecific abnormal finding of lung field: Secondary | ICD-10-CM

## 2019-08-24 DIAGNOSIS — R634 Abnormal weight loss: Secondary | ICD-10-CM | POA: Diagnosis not present

## 2019-08-24 DIAGNOSIS — D649 Anemia, unspecified: Secondary | ICD-10-CM | POA: Diagnosis present

## 2019-08-24 DIAGNOSIS — C7931 Secondary malignant neoplasm of brain: Principal | ICD-10-CM | POA: Diagnosis present

## 2019-08-24 DIAGNOSIS — K769 Liver disease, unspecified: Secondary | ICD-10-CM | POA: Diagnosis not present

## 2019-08-24 DIAGNOSIS — R0602 Shortness of breath: Secondary | ICD-10-CM

## 2019-08-24 DIAGNOSIS — Z515 Encounter for palliative care: Secondary | ICD-10-CM | POA: Diagnosis not present

## 2019-08-24 DIAGNOSIS — Z8249 Family history of ischemic heart disease and other diseases of the circulatory system: Secondary | ICD-10-CM

## 2019-08-24 DIAGNOSIS — R278 Other lack of coordination: Secondary | ICD-10-CM | POA: Diagnosis not present

## 2019-08-24 DIAGNOSIS — Z20822 Contact with and (suspected) exposure to covid-19: Secondary | ICD-10-CM | POA: Diagnosis present

## 2019-08-24 DIAGNOSIS — D849 Immunodeficiency, unspecified: Secondary | ICD-10-CM | POA: Diagnosis not present

## 2019-08-24 DIAGNOSIS — G4700 Insomnia, unspecified: Secondary | ICD-10-CM | POA: Diagnosis present

## 2019-08-24 DIAGNOSIS — R531 Weakness: Secondary | ICD-10-CM | POA: Diagnosis not present

## 2019-08-24 DIAGNOSIS — Z7401 Bed confinement status: Secondary | ICD-10-CM | POA: Diagnosis not present

## 2019-08-24 DIAGNOSIS — Z809 Family history of malignant neoplasm, unspecified: Secondary | ICD-10-CM

## 2019-08-24 DIAGNOSIS — R1312 Dysphagia, oropharyngeal phase: Secondary | ICD-10-CM | POA: Diagnosis not present

## 2019-08-24 DIAGNOSIS — Z510 Encounter for antineoplastic radiation therapy: Secondary | ICD-10-CM | POA: Insufficient documentation

## 2019-08-24 DIAGNOSIS — D0220 Carcinoma in situ of unspecified bronchus and lung: Secondary | ICD-10-CM | POA: Diagnosis not present

## 2019-08-24 LAB — COMPREHENSIVE METABOLIC PANEL
ALT: 12 U/L (ref 0–44)
AST: 19 U/L (ref 15–41)
Albumin: 3.4 g/dL — ABNORMAL LOW (ref 3.5–5.0)
Alkaline Phosphatase: 61 U/L (ref 38–126)
Anion gap: 13 (ref 5–15)
BUN: 27 mg/dL — ABNORMAL HIGH (ref 8–23)
CO2: 23 mmol/L (ref 22–32)
Calcium: 8.9 mg/dL (ref 8.9–10.3)
Chloride: 97 mmol/L — ABNORMAL LOW (ref 98–111)
Creatinine, Ser: 0.96 mg/dL (ref 0.61–1.24)
GFR calc Af Amer: >60 mL/min (ref >60)
GFR calc non Af Amer: >60 mL/min (ref >60)
Glucose, Bld: 141 mg/dL — ABNORMAL HIGH (ref 70–99)
Potassium: 3.7 mmol/L (ref 3.5–5.1)
Sodium: 133 mmol/L — ABNORMAL LOW (ref 135–145)
Total Bilirubin: 0.8 mg/dL (ref 0.3–1.2)
Total Protein: 7.1 g/dL (ref 6.5–8.1)

## 2019-08-24 LAB — URINALYSIS, ROUTINE W REFLEX MICROSCOPIC
Bacteria, UA: NONE SEEN
Bilirubin Urine: NEGATIVE
Glucose, UA: NEGATIVE mg/dL
Ketones, ur: NEGATIVE mg/dL
Leukocytes,Ua: NEGATIVE
Nitrite: NEGATIVE
Protein, ur: NEGATIVE mg/dL
Specific Gravity, Urine: 1.016 (ref 1.005–1.030)
pH: 5 (ref 5.0–8.0)

## 2019-08-24 LAB — CBC WITH DIFFERENTIAL/PLATELET
Abs Immature Granulocytes: 0.06 10*3/uL (ref 0.00–0.07)
Basophils Absolute: 0.1 10*3/uL (ref 0.0–0.1)
Basophils Relative: 1 %
Eosinophils Absolute: 0.2 10*3/uL (ref 0.0–0.5)
Eosinophils Relative: 1 %
HCT: 38.8 % — ABNORMAL LOW (ref 39.0–52.0)
Hemoglobin: 12.2 g/dL — ABNORMAL LOW (ref 13.0–17.0)
Immature Granulocytes: 1 %
Lymphocytes Relative: 8 %
Lymphs Abs: 1 10*3/uL (ref 0.7–4.0)
MCH: 30.6 pg (ref 26.0–34.0)
MCHC: 31.4 g/dL (ref 30.0–36.0)
MCV: 97.2 fL (ref 80.0–100.0)
Monocytes Absolute: 0.9 10*3/uL (ref 0.1–1.0)
Monocytes Relative: 7 %
Neutro Abs: 10.9 10*3/uL — ABNORMAL HIGH (ref 1.7–7.7)
Neutrophils Relative %: 82 %
Platelets: 460 10*3/uL — ABNORMAL HIGH (ref 150–400)
RBC: 3.99 MIL/uL — ABNORMAL LOW (ref 4.22–5.81)
RDW: 15.2 % (ref 11.5–15.5)
WBC: 13.2 10*3/uL — ABNORMAL HIGH (ref 4.0–10.5)
nRBC: 0 % (ref 0.0–0.2)

## 2019-08-24 LAB — PROTIME-INR
INR: 1 (ref 0.8–1.2)
Prothrombin Time: 12.7 seconds (ref 11.4–15.2)

## 2019-08-24 LAB — APTT: aPTT: 34 seconds (ref 24–36)

## 2019-08-24 LAB — LACTIC ACID, PLASMA
Lactic Acid, Venous: 1.6 mmol/L (ref 0.5–1.9)
Lactic Acid, Venous: 0.7 mmol/L (ref 0.5–1.9)

## 2019-08-24 LAB — SARS CORONAVIRUS 2 BY RT PCR (HOSPITAL ORDER, PERFORMED IN ~~LOC~~ HOSPITAL LAB): SARS Coronavirus 2: NEGATIVE

## 2019-08-24 MED ORDER — DEXAMETHASONE 4 MG PO TABS
4.0000 mg | ORAL_TABLET | Freq: Two times a day (BID) | ORAL | Status: DC
  Administered 2019-08-24 – 2019-08-28 (×9): 4 mg via ORAL
  Filled 2019-08-24 (×9): qty 1

## 2019-08-24 MED ORDER — SODIUM CHLORIDE 0.9 % IV BOLUS (SEPSIS)
1000.0000 mL | Freq: Once | INTRAVENOUS | Status: AC
  Administered 2019-08-24: 1000 mL via INTRAVENOUS

## 2019-08-24 MED ORDER — ONDANSETRON HCL 4 MG PO TABS: 4.0000 mg | ORAL_TABLET | Freq: Four times a day (QID) | ORAL | Status: DC | PRN

## 2019-08-24 MED ORDER — HYDROCORTISONE 20 MG PO TABS
20.0000 mg | ORAL_TABLET | Freq: Every day | ORAL | Status: DC
  Administered 2019-08-24 – 2019-08-28 (×5): 20 mg via ORAL
  Filled 2019-08-24 (×5): qty 1

## 2019-08-24 MED ORDER — GADOBUTROL 1 MMOL/ML IV SOLN
6.0000 mL | Freq: Once | INTRAVENOUS | Status: AC | PRN
  Administered 2019-08-24: 6 mL via INTRAVENOUS

## 2019-08-24 MED ORDER — ACETAMINOPHEN 650 MG RE SUPP: 650.0000 mg | Freq: Four times a day (QID) | RECTAL | Status: DC | PRN

## 2019-08-24 MED ORDER — OXYCODONE HCL 5 MG PO TABS
5.0000 mg | ORAL_TABLET | Freq: Four times a day (QID) | ORAL | Status: DC | PRN
  Administered 2019-08-24 – 2019-08-28 (×6): 5 mg via ORAL
  Filled 2019-08-24 (×6): qty 1

## 2019-08-24 MED ORDER — BROMOCRIPTINE MESYLATE 2.5 MG PO TABS
2.5000 mg | ORAL_TABLET | Freq: Two times a day (BID) | ORAL | Status: DC
  Administered 2019-08-24 – 2019-08-28 (×9): 2.5 mg via ORAL
  Filled 2019-08-24 (×10): qty 1

## 2019-08-24 MED ORDER — IPRATROPIUM-ALBUTEROL 0.5-2.5 (3) MG/3ML IN SOLN: 3.0000 mL | Freq: Four times a day (QID) | RESPIRATORY_TRACT | Status: DC | PRN

## 2019-08-24 MED ORDER — VANCOMYCIN HCL IN DEXTROSE 1-5 GM/200ML-% IV SOLN
1000.0000 mg | Freq: Once | INTRAVENOUS | Status: AC
  Administered 2019-08-24: 1000 mg via INTRAVENOUS
  Filled 2019-08-24: qty 200

## 2019-08-24 MED ORDER — HYDROCORTISONE 10 MG PO TABS: 10.0000 mg | ORAL_TABLET | Freq: Every day | ORAL | Status: DC

## 2019-08-24 MED ORDER — POLYETHYLENE GLYCOL 3350 17 G PO PACK: 17.0000 g | PACK | Freq: Every day | ORAL | Status: DC | PRN

## 2019-08-24 MED ORDER — PANTOPRAZOLE SODIUM 40 MG PO TBEC
40.0000 mg | DELAYED_RELEASE_TABLET | Freq: Every day | ORAL | Status: DC
  Administered 2019-08-24 – 2019-08-25 (×2): 40 mg via ORAL
  Filled 2019-08-24 (×2): qty 1

## 2019-08-24 MED ORDER — HYDROCORTISONE 10 MG PO TABS
10.0000 mg | ORAL_TABLET | Freq: Every day | ORAL | Status: DC
  Administered 2019-08-24: 10 mg via ORAL
  Filled 2019-08-24: qty 1

## 2019-08-24 MED ORDER — SODIUM CHLORIDE 0.9 % IV SOLN
2.0000 g | INTRAVENOUS | Status: DC
  Administered 2019-08-24: 2 g via INTRAVENOUS
  Filled 2019-08-24: qty 20

## 2019-08-24 MED ORDER — ENOXAPARIN SODIUM 40 MG/0.4ML ~~LOC~~ SOLN: 40.0000 mg | SUBCUTANEOUS | Status: DC

## 2019-08-24 MED ORDER — FOLIC ACID 1 MG PO TABS
2.0000 mg | ORAL_TABLET | Freq: Every day | ORAL | Status: DC
  Administered 2019-08-24 – 2019-08-28 (×5): 2 mg via ORAL
  Filled 2019-08-24 (×5): qty 2

## 2019-08-24 MED ORDER — ACETAMINOPHEN 325 MG PO TABS
650.0000 mg | ORAL_TABLET | Freq: Four times a day (QID) | ORAL | Status: DC | PRN
  Administered 2019-08-27: 650 mg via ORAL
  Filled 2019-08-24: qty 2

## 2019-08-24 MED ORDER — ONDANSETRON HCL 4 MG/2ML IJ SOLN: 4.0000 mg | Freq: Four times a day (QID) | INTRAMUSCULAR | Status: DC | PRN

## 2019-08-24 MED ORDER — SODIUM CHLORIDE 0.9% FLUSH
3.0000 mL | Freq: Once | INTRAVENOUS | Status: AC
  Administered 2019-08-24: 3 mL via INTRAVENOUS

## 2019-08-24 MED ORDER — GUAIFENESIN-DM 100-10 MG/5ML PO SYRP
5.0000 mL | ORAL_SOLUTION | ORAL | Status: DC | PRN
  Administered 2019-08-27 – 2019-08-28 (×2): 5 mL via ORAL
  Filled 2019-08-24 (×2): qty 10

## 2019-08-24 MED ORDER — ENSURE ENLIVE PO LIQD
237.0000 mL | Freq: Two times a day (BID) | ORAL | Status: DC
  Administered 2019-08-26 – 2019-08-27 (×3): 237 mL via ORAL

## 2019-08-24 MED ORDER — ZOLPIDEM TARTRATE 5 MG PO TABS
5.0000 mg | ORAL_TABLET | Freq: Every evening | ORAL | Status: DC | PRN
  Administered 2019-08-24 – 2019-08-27 (×4): 5 mg via ORAL
  Filled 2019-08-24 (×4): qty 1

## 2019-08-24 MED ORDER — VANCOMYCIN HCL 500 MG/100ML IV SOLN
500.0000 mg | Freq: Two times a day (BID) | INTRAVENOUS | Status: DC
  Administered 2019-08-25 (×2): 500 mg via INTRAVENOUS
  Filled 2019-08-24 (×2): qty 100

## 2019-08-24 MED ORDER — PRAVASTATIN SODIUM 20 MG PO TABS
20.0000 mg | ORAL_TABLET | Freq: Every day | ORAL | Status: DC
  Administered 2019-08-24 – 2019-08-27 (×4): 20 mg via ORAL
  Filled 2019-08-24 (×4): qty 1

## 2019-08-24 MED ORDER — SODIUM CHLORIDE 0.9 % IV SOLN
500.0000 mg | INTRAVENOUS | Status: DC
  Administered 2019-08-24: 500 mg via INTRAVENOUS
  Filled 2019-08-24: qty 500

## 2019-08-24 MED ORDER — LEVOTHYROXINE SODIUM 50 MCG PO TABS
150.0000 ug | ORAL_TABLET | Freq: Every day | ORAL | Status: DC
  Administered 2019-08-25 – 2019-08-28 (×4): 150 ug via ORAL
  Filled 2019-08-24 (×4): qty 1

## 2019-08-24 MED ORDER — BISACODYL 5 MG PO TBEC
5.0000 mg | DELAYED_RELEASE_TABLET | Freq: Every day | ORAL | Status: DC | PRN
  Filled 2019-08-24: qty 1

## 2019-08-24 MED ORDER — SODIUM CHLORIDE 0.9 % IV SOLN
2.0000 g | Freq: Two times a day (BID) | INTRAVENOUS | Status: DC
  Administered 2019-08-24 (×2): 2 g via INTRAVENOUS
  Filled 2019-08-24 (×3): qty 2

## 2019-08-24 MED ORDER — SODIUM CHLORIDE 0.9 % IV SOLN: 1000.0000 mL | INTRAVENOUS | Status: DC

## 2019-08-24 MED ORDER — SODIUM CHLORIDE 0.9 % IV SOLN: INTRAVENOUS | Status: AC

## 2019-08-24 NOTE — Progress Notes (Signed)
Patient arrives to room 1522 at this time.  Settled into bed with assist of 2.  Siderails up x2 and callbell in reach.

## 2019-08-24 NOTE — ED Triage Notes (Signed)
Pt presents via [facility] for evaluation of increased weakness and fever. Pt recently dx with metastatic cancer. Pt currently alert and oriented x3. EMS administered 1000mg  Tylenol PO and 555ml NS bolus during transport.

## 2019-08-24 NOTE — ED Provider Notes (Addendum)
[facility] DEPT Provider Note: Georgena Spurling, MD, FACEP  CSN: [id number] MRN: [id number] ARRIVAL: 08/24/19 at [place]: [place]  Fever   HISTORY OF PRESENT ILLNESS  08/24/19 5:14 AM Ryan Coffey is a 86 y.o. male who was recently diagnosed with metastatic cancer of the right upper lobe of the lung. He is here with increased weakness and new onset fever this morning of 102.1 at home, 103.3 per EMS. His blood pressure is noted to be 97/58. He was given 1 g of Tylenol orally (with improvement in his fever to 100.4 on arrival) and 500 mL of normal saline IV prior to arrival.  He denies pain except in his left ribs where he had a recent injury; x-rays showed no fracture.  He has had hemoptysis but denies shortness of breath, abdominal pain, nausea vomiting or diarrhea.  He has had a 30 pound weight loss in the past 1 to 2 months.  Patient was hypoxic to 88% on arrival which improved with oxygen by nasal cannula.   Past Medical History:  Diagnosis Date  . Arthritis   . Benign tumor of pituitary gland ([place])    "wasn't able to get it all"  . COPD (chronic obstructive pulmonary disease) ([place])   . Pneumonia 06/27/11   "first time"  . Shingles     Past Surgical History:  Procedure Laterality Date  . CHOLECYSTECTOMY OPEN  2010  . KIDNEY SURGERY  2011   "lesion removed right side"  . RENAL BIOPSY  2012   right  . TRANSPHENOIDAL / TRANSNASAL HYPOPHYSECTOMY / RESECTION PITUITARY TUMOR  ~ 1979   "they couldn't get it all"    Family History  Problem Relation Age of Onset  . Heart disease Mother   . Cancer Brother   . Heart disease Brother     Social History   Tobacco Use  . Smoking status: Former Smoker    Packs/day: 0.50    Years: 10.00    Pack years: 5.00    Types: Cigarettes    Quit date: 08/03/1980    Years since quitting: 39.0  . Smokeless tobacco: Never Used  Vaping Use  . Vaping Use: Never used  Substance Use Topics  . Alcohol use: Yes     Comment: wine occ  . Drug use: No    Prior to Admission medications   Medication Sig Start Date End Date Taking? Authorizing Provider  methotrexate (RHEUMATREX) 2.5 MG tablet TAKE 4 TABLETS ON SATURDAY AND 4 TABLETS ON SUNDAY. CAUTION:CHEMOTHERAPY. PROTECT FROM LIGHT. Patient taking differently: Take 10 mg by mouth 2 (two) times a week. TAKE 4 tablets on Saturday and 4 tablets on Sunday. Caution:Chemotherapy. Protect from light. 08/12/19   Bo Merino, MD  acetaminophen (TYLENOL) 325 MG tablet Take 650 mg by mouth 2 (two) times daily as needed (arthritis).    [provider]  bromocriptine (PARLODEL) 2.5 MG tablet Take 2.5 mg by mouth 2 (two) times daily.    [provider]  folic acid (FOLVITE) 1 MG tablet TAKE 2 TABLETS EVERY DAY Patient taking differently: Take 2 mg by mouth daily.  08/04/18   Bo Merino, MD  guaiFENesin-dextromethorphan (ROBITUSSIN DM) 100-10 MG/5ML syrup Take 10 mLs by mouth every 4 (four) hours as needed for cough. Patient not taking: Reported on 08/20/2019 09/25/18   Roxan Hockey, MD  hydrocortisone (CORTEF) 20 MG tablet Take 10-20 mg by mouth daily. 20 mg in the morning and 10mg  at night 11/02/16  [provider]  levothyroxine (SYNTHROID) 150 MCG tablet Take 150 mcg by mouth daily before breakfast.  07/29/19   [provider]  pantoprazole (PROTONIX) 40 MG tablet Take 1 tablet (40 mg total) by mouth daily. 09/25/18   Roxan Hockey, MD  pravastatin (PRAVACHOL) 20 MG tablet Take 20 mg by mouth daily.  07/24/19   [provider]  PROTONIX 20 MG tablet Take 20 mg by mouth daily.  07/29/19   [provider]  testosterone cypionate (DEPOTESTOSTERONE CYPIONATE) 200 MG/ML injection Inject 75 mg into the muscle every 14 (fourteen) days.  05/24/16   [provider]  zolpidem (AMBIEN CR) 6.25 MG CR tablet Take 1 tablet (6.25 mg total) by mouth at bedtime as needed for sleep. Patient taking differently: Take  6.25 mg by mouth at bedtime.  05/20/18   Shelly Coss, MD    Allergies Patient has no known allergies.   REVIEW OF SYSTEMS  Negative except as noted here or in the History of Present Illness.   PHYSICAL EXAMINATION  Initial Vital Signs Blood pressure (!) 97/58, pulse 98, temperature (!) 100.4 F (38 C), temperature source Oral, resp. rate 13, height 5\' 8"  (1.727 m), weight 63.5 kg, SpO2 (!) 88 %.  Examination General: Well-developed, cachectic male in no acute distress; appearance consistent with age of record HENT: normocephalic; atraumatic Eyes: pupils equal, round and reactive to light; extraocular muscles intact Neck: supple Heart: regular rate and rhythm Lungs: Distant sounds; faint rhonchi throughout Abdomen: soft; nondistended; nontender; bowel sounds present Extremities: No deformity; full range of motion; pulses normal Neurologic: Awake, alert and oriented; motor function intact in all extremities and symmetric; no facial droop Skin: Warm and dry Psychiatric: Normal mood and affect   RESULTS  Summary of this visit's results, reviewed and interpreted by myself:   EKG Interpretation  Date/Time:    Ventricular Rate:    PR Interval:    QRS Duration:   QT Interval:    QTC Calculation:   R Axis:     Text Interpretation:        Laboratory Studies: Results for orders placed or performed during the hospital encounter of 08/24/19 (from the past 24 hour(s))  APTT     Status: None   Collection Time: 08/24/19  5:21 AM  Result Value Ref Range   aPTT 34 24 - 36 seconds  Protime-INR     Status: None   Collection Time: 08/24/19  5:21 AM  Result Value Ref Range   Prothrombin Time 12.7 11.4 - 15.2 seconds   INR 1.0 0.8 - 1.2  Lactic acid, plasma     Status: None   Collection Time: 08/24/19  5:22 AM  Result Value Ref Range   Lactic Acid, Venous 1.6 0.5 - 1.9 mmol/L  Comprehensive metabolic panel     Status: Abnormal   Collection Time: 08/24/19  5:22 AM  Result  Value Ref Range   Sodium 133 (L) 135 - 145 mmol/L   Potassium 3.7 3.5 - 5.1 mmol/L   Chloride 97 (L) 98 - 111 mmol/L   CO2 23 22 - 32 mmol/L   Glucose, Bld 141 (H) 70 - 99 mg/dL   BUN 27 (H) 8 - 23 mg/dL   Creatinine, Ser 0.96 0.61 - 1.24 mg/dL   Calcium 8.9 8.9 - 10.3 mg/dL   Total Protein 7.1 6.5 - 8.1 g/dL   Albumin 3.4 (L) 3.5 - 5.0 g/dL   AST 19 15 - 41 U/L   ALT 12 0 -  44 U/L   Alkaline Phosphatase 61 38 - 126 U/L   Total Bilirubin 0.8 0.3 - 1.2 mg/dL   GFR calc non Af Amer >60 >60 mL/min   GFR calc Af Amer >60 >60 mL/min   Anion gap 13 5 - 15  CBC with Differential     Status: Abnormal   Collection Time: 08/24/19  5:22 AM  Result Value Ref Range   WBC 13.2 (H) 4.0 - 10.5 K/uL   RBC 3.99 (L) 4.22 - 5.81 MIL/uL   Hemoglobin 12.2 (L) 13.0 - 17.0 g/dL   HCT 38.8 (L) 39 - 52 %   MCV 97.2 80.0 - 100.0 fL   MCH 30.6 26.0 - 34.0 pg   MCHC 31.4 30.0 - 36.0 g/dL   RDW 15.2 11.5 - 15.5 %   Platelets 460 (H) 150 - 400 K/uL   nRBC 0.0 0.0 - 0.2 %   Neutrophils Relative % 82 %   Neutro Abs 10.9 (H) 1.7 - 7.7 K/uL   Lymphocytes Relative 8 %   Lymphs Abs 1.0 0.7 - 4.0 K/uL   Monocytes Relative 7 %   Monocytes Absolute 0.9 0 - 1 K/uL   Eosinophils Relative 1 %   Eosinophils Absolute 0.2 0 - 0 K/uL   Basophils Relative 1 %   Basophils Absolute 0.1 0 - 0 K/uL   Immature Granulocytes 1 %   Abs Immature Granulocytes 0.06 0.00 - 0.07 K/uL  Urinalysis, Routine w reflex microscopic     Status: Abnormal   Collection Time: 08/24/19  5:22 AM  Result Value Ref Range   Color, Urine YELLOW YELLOW   APPearance CLEAR CLEAR   Specific Gravity, Urine 1.016 1.005 - 1.030   pH 5.0 5.0 - 8.0   Glucose, UA NEGATIVE NEGATIVE mg/dL   Hgb urine dipstick SMALL (A) NEGATIVE   Bilirubin Urine NEGATIVE NEGATIVE   Ketones, ur NEGATIVE NEGATIVE mg/dL   Protein, ur NEGATIVE NEGATIVE mg/dL   Nitrite NEGATIVE NEGATIVE   Leukocytes,Ua NEGATIVE NEGATIVE   RBC / HPF 0-5 0 - 5 RBC/hpf   WBC, UA 0-5 0 - 5  WBC/hpf   Bacteria, UA NONE SEEN NONE SEEN   Mucus PRESENT    Imaging Studies: DG Chest 2 View  Result Date: 08/24/2019 CLINICAL DATA:  Fever.  Probable lung cancer. EXAM: CHEST - 2 VIEW COMPARISON:  CT of the chest, abdomen, and pelvis 08/20/2019. FINDINGS: Heart size normal. Atherosclerotic changes are present at the aortic arch. Right upper lobe spiculated mass in perihilar opacities are again noted. No new airspace disease is present. IMPRESSION: 1. Stable right upper lobe mass and perihilar opacities. 2. No new airspace disease. 3. Atherosclerosis. Electronically Signed   By: San Morelle M.D.   On: 08/24/2019 07:03    ED COURSE and MDM  Nursing notes, initial and subsequent vitals signs, including pulse oximetry, reviewed and interpreted by myself.  Vitals:   08/24/19 0509 08/24/19 0530 08/24/19 0600 08/24/19 0630  BP:  94/76 (!) 101/53 (!) 99/51  Pulse:  90 80 75  Resp:  11 15 (!) 26  Temp:      TempSrc:      SpO2:  96% 97% 97%  Weight: 63.5 kg     Height: 5\' 8"  (1.727 m)      Medications  0.9 %  sodium chloride infusion (has no administration in time range)  cefTRIAXone (ROCEPHIN) 2 g in sodium chloride 0.9 % 100 mL IVPB ( Intravenous Stopped 08/24/19 0624)  azithromycin (ZITHROMAX)  500 mg in sodium chloride 0.9 % 250 mL IVPB (500 mg Intravenous New Bag/Given 08/24/19 0637)  sodium chloride flush (NS) 0.9 % injection 3 mL (3 mLs Intravenous Given 08/24/19 0546)  sodium chloride 0.9 % bolus 1,000 mL (1,000 mLs Intravenous New Bag/Given 08/24/19 0546)    And  sodium chloride 0.9 % bolus 1,000 mL (1,000 mLs Intravenous New Bag/Given 08/24/19 0547)   5:38 AM Code sepsis initiated.  Sepsis protocol initiated.  We will start antibiotics for suspected pneumonia given patient's recent diagnosis of lung cancer, hemoptysis and hypoxia.  7:05 AM Patient clinically ill with possible pneumonia and likely SIRS.  Will have patient admitted.   PROCEDURES  Procedures   ED  DIAGNOSES     ICD-10-CM   1. SIRS (systemic inflammatory response syndrome) (HCC)  R65.10   2. Hypoxia  R09.02   3. Mass of right lung  R91.8        Lavere Shinsky, Jenny Reichmann, MD 08/24/19 4469    Shanon Rosser, MD 08/24/19 [phone]

## 2019-08-24 NOTE — Consult Note (Signed)
NAME:  Ryan Coffey, MRN:  [id number], DOB:  04/03/1932, LOS: 0 ADMISSION DATE:  08/24/2019, CONSULTATION DATE:  08/24/2019 REFERRING MD:  Nicole Kindred MD, CHIEF COMPLAINT:  Lung mass  Brief History   86 year old with history of seropositive rheumatoid arthritis, renal cell cancer in remission, hyperlipidemia, pituitary tumor. Right lower lobe cavitary pneumonia in July 2020 which improved on subsequent CT scans Generalized weakness, fevers.  He had a recent ED visit for slurred speech, vision changes with findings on imaging concerning for new right upper lobe mass with metastatic lesions to brain.  Past Medical History    has a past medical history of Arthritis, Benign tumor of pituitary gland ([place]), COPD (chronic obstructive pulmonary disease) ([place]), Pneumonia (06/27/11), and Shingles.  Significant Hospital Events   6/21 Admit  Consults:    Procedures:    Significant Diagnostic Tests:  CT head, chest abdomen pelvis 6/17-new spiculated mass in the right upper lobe, resolution of cavitary pneumonia in the right lower lobe.  Advanced metastatic disease throughout the brain.  Micro Data:    Antimicrobials:  Vanco Cefepime  Interim history/subjective:    Objective   Blood pressure (!) 153/85, pulse 80, temperature 99.2 F (37.3 C), temperature source Oral, resp. rate 16, height 5\' 8"  (1.727 m), weight 63.5 kg, SpO2 94 %.        Intake/Output Summary (Last 24 hours) at 08/24/2019 1440 Last data filed at 08/24/2019 1497 Gross per 24 hour  Intake 98.35 ml  Output --  Net 98.35 ml   Filed Weights   08/24/19 0509  Weight: 63.5 kg    Examination: Blood pressure (!) 153/85, pulse 80, temperature 99.2 F (37.3 C), temperature source Oral, resp. rate 16, height 5\' 8"  (1.727 m), weight 63.5 kg, SpO2 94 %. Gen:      No acute distress HEENT:  EOMI, sclera anicteric Neck:     No masses; no thyromegaly Lungs:    Clear to auscultation bilaterally; normal respiratory  effort CV:         Regular rate and rhythm; no murmurs Abd:      + bowel sounds; soft, non-tender; no palpable masses, no distension Ext:    No edema; adequate peripheral perfusion Skin:      Warm and dry; no rash Neuro: alert and oriented x 3 Psych: normal mood and affect  Resolved Hospital Problem list     Assessment & Plan:  Right lung mass with brain lesions Findings highly suggestive of metastatic stage IV cancer Discussed with my partner Dr. Valeta Harms regarding options for navigational biopsy He will either need a navigational bronchoscope biopsy or CT-guided biopsy of the lesion to make a diagnosis  Given his age, frailty, DNR status either of these would be risky  We would like to get a PET scan before the biopsy to ensure maximum yield and to see if there are other areas to biopsy safely. I am not sure if this can be done while inpatient due to insurance reasons. He has already been scheduled for a PET scan as outpatient on 6/28 and may need to get that done first.   Discussed in detail with patient and family including daughters at bedside. At this point they are not sure if they want to proceed with the workup and want to discuss further among themselves.  We will continue to follow   Labs   CBC: Recent Labs  Lab 08/20/19 1600 08/24/19 0522  WBC 14.2* 13.2*  NEUTROABS 11.7* 10.9*  HGB 13.0  12.2*  HCT 41.2 38.8*  MCV 97.4 97.2  PLT 474* 460*    Basic Metabolic Panel: Recent Labs  Lab 08/20/19 1600 08/24/19 0522  NA 135 133*  K 4.7 3.7  CL 97* 97*  CO2 26 23  GLUCOSE 106* 141*  BUN 32* 27*  CREATININE 1.11 0.96  CALCIUM 9.6 8.9   GFR: Estimated Creatinine Clearance: 51.4 mL/min (by C-G formula based on SCr of 0.96 mg/dL). Recent Labs  Lab 08/20/19 1600 08/24/19 0522 08/24/19 0744  WBC 14.2* 13.2*  --   LATICACIDVEN  --  1.6 0.7    Liver Function Tests: Recent Labs  Lab 08/20/19 1600 08/24/19 0522  AST 21 19  ALT 12 12  ALKPHOS 64 61   BILITOT 1.0 0.8  PROT 8.0 7.1  ALBUMIN 3.9 3.4*   No results for input(s): LIPASE, AMYLASE in the last 168 hours. No results for input(s): AMMONIA in the last 168 hours.  ABG    Component Value Date/Time   PHART 7.417 08/25/2006 0500   PCO2ART 38.4 08/25/2006 0500   PO2ART 45.7 (L) 08/25/2006 0500   HCO3 24.3 (H) 08/25/2006 0500   TCO2 25.4 08/25/2006 0500   O2SAT 82.8 08/25/2006 0500     Coagulation Profile: Recent Labs  Lab 08/24/19 0521  INR 1.0    Cardiac Enzymes: No results for input(s): CKTOTAL, CKMB, CKMBINDEX, TROPONINI in the last 168 hours.  HbA1C: No results found for: HGBA1C  CBG: No results for input(s): GLUCAP in the last 168 hours.  Review of Systems:   REVIEW OF SYSTEMS:   All negative; except for those that are bolded, which indicate positives.  Constitutional: weight loss, weight gain, night sweats, fevers, chills, fatigue, weakness.  HEENT: headaches, sore throat, sneezing, nasal congestion, post nasal drip, difficulty swallowing, tooth/dental problems, visual complaints, visual changes, ear aches. Neuro: difficulty with speech, weakness, numbness, ataxia. CV:  chest pain, orthopnea, PND, swelling in lower extremities, dizziness, palpitations, syncope.  Resp: cough, hemoptysis, dyspnea, wheezing. GI: heartburn, indigestion, abdominal pain, nausea, vomiting, diarrhea, constipation, change in bowel habits, loss of appetite, hematemesis, melena, hematochezia.  GU: dysuria, change in color of urine, urgency or frequency, flank pain, hematuria. MSK: joint pain or swelling, decreased range of motion. Psych: change in mood or affect, depression, anxiety, suicidal ideations, homicidal ideations. Skin: rash, itching, bruising.  Past Medical History  He,  has a past medical history of Arthritis, Benign tumor of pituitary gland ([place]), COPD (chronic obstructive pulmonary disease) ([place]), Pneumonia (06/27/11), and Shingles.   Surgical History    Past  Surgical History:  Procedure Laterality Date  . CHOLECYSTECTOMY OPEN  2010  . KIDNEY SURGERY  2011   "lesion removed right side"  . RENAL BIOPSY  2012   right  . TRANSPHENOIDAL / TRANSNASAL HYPOPHYSECTOMY / RESECTION PITUITARY TUMOR  ~ 1979   "they couldn't get it all"     Social History   reports that he quit smoking about 39 years ago. His smoking use included cigarettes. He has a 5.00 pack-year smoking history. He has never used smokeless tobacco. He reports current alcohol use. He reports that he does not use drugs.   Family History   His family history includes Cancer in his brother; Heart disease in his brother and mother.   Allergies No Known Allergies   Home Medications  Prior to Admission medications   Medication Sig Start Date End Date Taking? Authorizing Provider  acetaminophen (TYLENOL) 325 MG tablet Take 650 mg by mouth 2 (two) times  daily as needed (arthritis).   Yes [provider]  bromocriptine (PARLODEL) 2.5 MG tablet Take 2.5 mg by mouth 2 (two) times daily.   Yes [provider]  folic acid (FOLVITE) 1 MG tablet TAKE 2 TABLETS EVERY DAY Patient taking differently: Take 2 mg by mouth daily.  08/04/18  Yes Deveshwar, Abel Presto, MD  hydrocortisone (CORTEF) 20 MG tablet Take 10-20 mg by mouth daily. 20 mg in the morning and 10mg  at night 11/02/16  Yes [provider]  levothyroxine (SYNTHROID) 150 MCG tablet Take 150 mcg by mouth daily before breakfast.  07/29/19  Yes [provider]  methotrexate (RHEUMATREX) 2.5 MG tablet TAKE 4 TABLETS ON SATURDAY AND 4 TABLETS ON SUNDAY. CAUTION:CHEMOTHERAPY. PROTECT FROM LIGHT. Patient taking differently: Take 10 mg by mouth 2 (two) times a week. TAKE 4 tablets on Saturday and 4 tablets on Sunday. Caution:Chemotherapy. Protect from light. 08/12/19  Yes Deveshwar, Abel Presto, MD  oxyCODONE (OXY IR/ROXICODONE) 5 MG immediate release tablet Take 5 mg by mouth every 6 (six) hours as needed for severe pain.   Yes  [provider]  pantoprazole (PROTONIX) 40 MG tablet Take 1 tablet (40 mg total) by mouth daily. 09/25/18  Yes Emokpae, Courage, MD  pravastatin (PRAVACHOL) 20 MG tablet Take 20 mg by mouth daily.  07/24/19  Yes [provider]  testosterone cypionate (DEPOTESTOSTERONE CYPIONATE) 200 MG/ML injection Inject 75 mg into the muscle every 14 (fourteen) days.  05/24/16  Yes [provider]  zolpidem (AMBIEN CR) 6.25 MG CR tablet Take 1 tablet (6.25 mg total) by mouth at bedtime as needed for sleep. Patient taking differently: Take 6.25 mg by mouth at bedtime.  05/20/18  Yes Adhikari, Tamsen Meek, MD  guaiFENesin-dextromethorphan (ROBITUSSIN DM) 100-10 MG/5ML syrup Take 10 mLs by mouth every 4 (four) hours as needed for cough. Patient not taking: Reported on 08/20/2019 09/25/18   Roxan Hockey, MD     Critical care time: NA   Marshell Garfinkel MD [facility] Please see [email or web address] for pager details.  08/24/2019, 3:21 PM

## 2019-08-24 NOTE — ED Notes (Signed)
Attempted report call x2

## 2019-08-24 NOTE — Progress Notes (Signed)
Rad Onc Service was notified about the patient and we anticipate a role for palliative radiotherapy for whole brain, and possibly chest. I will reach out and make contact with the patient. Would prefer tissue confirmation of suspected lung cancer prior to initiating radiation. Plan for simulation Tuesday, starting treatment hopefully by Thursday if we can get path.     Carola Rhine, PAC

## 2019-08-24 NOTE — Consult Note (Signed)
Radiation Oncology         [phone] ________________________________  Name: Ryan Coffey        MRN: [id number]  Date of Service: 08/24/2019 DOB: [birth year]  EU:MPNT, Ravisankar, MD     REFERRING PHYSICIAN: Dr. Arbutus Ped  DIAGNOSIS: The primary encounter diagnosis was SIRS (systemic inflammatory response syndrome) ([place]). Diagnoses of Hypoxia and Mass of right lung were also pertinent to this visit.   HISTORY OF PRESENT ILLNESS: Ryan Coffey is a 86 y.o. male seen at the request of Dr. Arbutus Ped for a new finding of widespread metastatic disease to the brain, as well as a mass in the right lung.  The patient has a history of a right renal cell carcinoma. The patient was treated with partial nephrectomy on the right side in 2011. He has since been NED. He has a history of COPD and within the last year was switched to Trelegy inhaler which apparently was about the time of the patient developing pneumonia in April 2020.  There was some question as to whether or not he truly had Covid at that time, but Dr. Matilde Bash notes indicate that he had tested negative felt to be a false negative.  He had a CT scan This has been ordered. It shows extensive masslike consolidation of the right lung centered about the superior segment of the right lower lobe and involving the right middle lobe, recommendations were to continue to follow this, and repeat CT of the chest on 09/22/2018 revealed persistent cavitary lesion in the superior segment of the right lower lower lobe measuring 8.4 x 6.6 x 6.6 cm extensive heterogeneous airspace disease was identified as well as a small right pleural effusion, new cavitation was seen in adjacent airspace disease was generally worsened.  Follow-up CT chest on 11/17/2018 without contrast revealed a decrease in the size of the cavitary lesion in the superior segment of the right lower lobe with surrounding areas of architectural distortion, increasing patchy subpleural areas of  groundglass opacity and tree-in-bud nodularity in the periphery of the left lower lobe and lingula were identified.  Severe centrilobular and paraseptal emphysematous changes and reticular changes to suggest a developing interstitial fibrosis was noted.  Central pulmonary arterial enlargement compatible with pulmonary arterial hypertension was noted.  Multiple thyroid nodules were also seen as well as emphysematous and are arterial atherosclerotic disease.  He was planning to follow back up in last week presented with increasing confusion to the emergency room.  On 08/20/2019 a CT scan without contrast was performed after he had had confusion and slurred speech and left facial droop and this revealed advanced widespread metastatic disease throughout the brain and total estimating 40-50 lesions associated with petechial blood products many with vasogenic edema without midline shift or hydrocephalus.  Distant transsphenoidal pituitary surgical changes were seen.  The patient was discharged from the emergency room with plans to follow-up with pulmonary and get referred to oncology.  He is to see [name] later this week.  He present however with fever and 24 hours of new onset hemoptysis.  We are asked to see the patient to consider options of palliative radiotherapy.    PREVIOUS RADIATION THERAPY: No   PAST MEDICAL HISTORY:  Past Medical History:  Diagnosis Date  . Arthritis   . Benign tumor of pituitary gland ([place])    "wasn't able to get it all"  . COPD (chronic obstructive pulmonary disease) ([place])   . Pneumonia 06/27/11   "first time"  . Shingles  PAST SURGICAL HISTORY: Past Surgical History:  Procedure Laterality Date  . CHOLECYSTECTOMY OPEN  2010  . KIDNEY SURGERY  2011   "lesion removed right side"  . RENAL BIOPSY  2012   right  . TRANSPHENOIDAL / TRANSNASAL HYPOPHYSECTOMY / RESECTION PITUITARY TUMOR  ~ 1979   "they couldn't get it all"     FAMILY HISTORY:  Family History    Problem Relation Age of Onset  . Heart disease Mother   . Cancer Brother   . Heart disease Brother      SOCIAL HISTORY:  reports that he quit smoking about 39 years ago. His smoking use included cigarettes. He has a 5.00 pack-year smoking history. He has never used smokeless tobacco. He reports current alcohol use. He reports that he does not use drugs. the patient is widowed and lives in [place]. He has two adult daughters.   ALLERGIES: Patient has no known allergies.   MEDICATIONS:  Current Facility-Administered Medications  Medication Dose Route Frequency Provider Last Rate Last Admin  . 0.9 %  sodium chloride infusion   Intravenous Continuous Ezekiel Slocumb, DO 75 mL/hr at 08/24/19 1111 New Bag at 08/24/19 1111  . acetaminophen (TYLENOL) tablet 650 mg  650 mg Oral Q6H PRN Nicole Kindred A, DO       Or  . acetaminophen (TYLENOL) suppository 650 mg  650 mg Rectal Q6H PRN Ezekiel Slocumb, DO      . bisacodyl (DULCOLAX) EC tablet 5 mg  5 mg Oral Daily PRN Nicole Kindred A, DO      . bromocriptine (PARLODEL) tablet 2.5 mg  2.5 mg Oral BID Nicole Kindred A, DO      . ceFEPIme (MAXIPIME) 2 g in sodium chloride 0.9 % 100 mL IVPB  2 g Intravenous Q12H Lynelle Doctor, RPH   Stopped at 08/24/19 1243  . dexamethasone (DECADRON) tablet 4 mg  4 mg Oral Q12H Nicole Kindred A, DO      . folic acid (FOLVITE) tablet 2 mg  2 mg Oral Daily Nicole Kindred A, DO      . guaiFENesin-dextromethorphan (ROBITUSSIN DM) 100-10 MG/5ML syrup 5 mL  5 mL Oral Q4H PRN Nicole Kindred A, DO      . hydrocortisone (CORTEF) tablet 10 mg  10 mg Oral QHS Nicole Kindred A, DO      . hydrocortisone (CORTEF) tablet 20 mg  20 mg Oral Daily Nicole Kindred A, DO      . ipratropium-albuterol (DUONEB) 0.5-2.5 (3) MG/3ML nebulizer solution 3 mL  3 mL Nebulization Q6H PRN Ezekiel Slocumb, DO      . [START ON 08/25/2019] levothyroxine (SYNTHROID) tablet 150 mcg  150 mcg Oral QAC breakfast Nicole Kindred A, DO      .  ondansetron (ZOFRAN) tablet 4 mg  4 mg Oral Q6H PRN Nicole Kindred A, DO       Or  . ondansetron (ZOFRAN) injection 4 mg  4 mg Intravenous Q6H PRN Nicole Kindred A, DO      . oxyCODONE (Oxy IR/ROXICODONE) immediate release tablet 5 mg  5 mg Oral Q6H PRN Nicole Kindred A, DO      . pantoprazole (PROTONIX) EC tablet 40 mg  40 mg Oral Daily Nicole Kindred A, DO      . polyethylene glycol (MIRALAX / GLYCOLAX) packet 17 g  17 g Oral Daily PRN Nicole Kindred A, DO      . pravastatin (PRAVACHOL) tablet 20 mg  20 mg Oral q1800 Arbutus Ped,  Kelly A, DO      . vancomycin (VANCOREADY) IVPB 500 mg/100 mL  500 mg Intravenous Q12H Pham, Anh P, RPH      . zolpidem (AMBIEN) tablet 5 mg  5 mg Oral QHS PRN Nicole Kindred A, DO         REVIEW OF SYSTEMS: On review of systems, the patient reports that he is having hemoptysis which is about a teaspoon of blood he sees. He's had this for a little over a week, and is now noticing this with every cough. He is having shortness of breath, cough and with sputum or blood. He is no longer having fevers. He has felt less confused and dizzy. He is not having nausea or vomiting. No other complaints are noted.    PHYSICAL EXAM:  Wt Readings from Last 3 Encounters:  08/24/19 140 lb (63.5 kg)  06/29/19 140 lb 3.2 oz (63.6 kg)  05/26/19 138 lb 9.6 oz (62.9 kg)   Temp Readings from Last 3 Encounters:  08/24/19 99.2 F (37.3 C) (Oral)  08/20/19 98.3 F (36.8 C) (Oral)  06/29/19 (!) 97.3 F (36.3 C) (Temporal)   BP Readings from Last 3 Encounters:  08/24/19 (!) 153/85  08/21/19 (!) 149/96  06/29/19 122/60   Pulse Readings from Last 3 Encounters:  08/24/19 80  08/21/19 76  06/29/19 95   Pain Assessment Pain Score: 0-No pain/10  Unable to assess due to encounter type.  ECOG = 2  0 - Asymptomatic (Fully active, able to carry on all predisease activities without restriction)  1 - Symptomatic but completely ambulatory (Restricted in physically strenuous  activity but ambulatory and able to carry out work of a light or sedentary nature. For example, light housework, office work)  2 - Symptomatic, <50% in bed during the day (Ambulatory and capable of all self care but unable to carry out any work activities. Up and about more than 50% of waking hours)  3 - Symptomatic, >50% in bed, but not bedbound (Capable of only limited self-care, confined to bed or chair 50% or more of waking hours)  4 - Bedbound (Completely disabled. Cannot carry on any self-care. Totally confined to bed or chair)  5 - Death   Eustace Pen MM, Creech RH, Tormey DC, et al. [id number]). "Toxicity and response criteria of the [facility] Group". [place]col. 5 (6): 649-55    LABORATORY DATA:  Lab Results  Component Value Date   WBC 13.2 (H) 08/24/2019   HGB 12.2 (L) 08/24/2019   HCT 38.8 (L) 08/24/2019   MCV 97.2 08/24/2019   PLT 460 (H) 08/24/2019   Lab Results  Component Value Date   NA 133 (L) 08/24/2019   K 3.7 08/24/2019   CL 97 (L) 08/24/2019   CO2 23 08/24/2019   Lab Results  Component Value Date   ALT 12 08/24/2019   AST 19 08/24/2019   ALKPHOS 61 08/24/2019   BILITOT 0.8 08/24/2019      RADIOGRAPHY: DG Chest 2 View  Result Date: 08/24/2019 CLINICAL DATA:  Fever.  Probable lung cancer. EXAM: CHEST - 2 VIEW COMPARISON:  CT of the chest, abdomen, and pelvis 08/20/2019. FINDINGS: Heart size normal. Atherosclerotic changes are present at the aortic arch. Right upper lobe spiculated mass in perihilar opacities are again noted. No new airspace disease is present. IMPRESSION: 1. Stable right upper lobe mass and perihilar opacities. 2. No new airspace disease. 3. Atherosclerosis. Electronically Signed   By: Wynetta Fines.D.  On: 08/24/2019 07:03   DG Chest 2 View  Result Date: 08/20/2019 CLINICAL DATA:  Cough, left rib pain for 2 weeks EXAM: CHEST - 2 VIEW COMPARISON:  10/07/2018 FINDINGS: Frontal and lateral views of the chest  demonstrate a stable cardiac silhouette. Persistent areas of scarring are seen within the superior segment right lower lobe. No new airspace disease, effusion, or pneumothorax. Significant background emphysema. Severe right shoulder osteoarthritis. IMPRESSION: 1. Stable scarring within the right lower lobe. 2. Emphysema. 3. No acute process. Electronically Signed   By: Randa Ngo M.D.   On: 08/20/2019 16:32   DG Ribs Unilateral Left  Result Date: 08/20/2019 CLINICAL DATA:  Cough, left rib pain for 2 weeks EXAM: LEFT RIBS - 2 VIEW COMPARISON:  10/07/2018 FINDINGS: Frontal and oblique views of the left thoracic cage are obtained. There are no acute displaced rib fractures. Visualized portions of the left lung are clear. Chronic scarring within the right lower lobe again noted. IMPRESSION: 1. No acute displaced fracture. Electronically Signed   By: Randa Ngo M.D.   On: 08/20/2019 16:34   CT Head Wo Contrast  Result Date: 08/20/2019 CLINICAL DATA:  Slurred speech and left facial droop.  Lethargy. EXAM: CT HEAD WITHOUT CONTRAST TECHNIQUE: Contiguous axial images were obtained from the base of the skull through the vertex without intravenous contrast. COMPARISON:  None. FINDINGS: Brain: Multiple brain masses disseminated throughout the cerebellum and both cerebral hemispheres consistent with advanced intracranial metastatic disease. Most of these are associated with petechial blood products. No frank hematoma. Many of the lesions are associated with vasogenic edema. There is no midline shift. No hydrocephalus or extra-axial collection. Index left cerebellar lesion image 8 measures 14 mm. Index right temporal lesion image 13 measures 28 mm. Index left occipital lesion image 16 measures 23 mm. Index left frontal lesion image 16 measures 28 mm. Index right frontoparietal lesion image 20 for measures 17 mm. Vascular: There is atherosclerotic calcification of the major vessels at the base of the brain. Skull:  Previous trans-sphenoidal surgery. No lytic calvarial lesion. Sinuses/Orbits: Clear/normal Other: None IMPRESSION: Advanced widespread metastatic disease throughout the brain. In total, there are probably 40-50 lesions. Most are associated with petechial blood products. Many are associated with vasogenic edema. No midline shift. No hydrocephalus. Lung cancer is the most common primary tumor to present with this pattern. Distant trans-sphenoidal pituitary surgery. Electronically Signed   By: Nelson Chimes M.D.   On: 08/20/2019 15:48   CT Chest W Contrast  Result Date: 08/20/2019 CLINICAL DATA:  Altered level of consciousness, diffuse intracranial metastases with unknown primary malignancy EXAM: CT CHEST, ABDOMEN, AND PELVIS WITH CONTRAST TECHNIQUE: Multidetector CT imaging of the chest, abdomen and pelvis was performed following the standard protocol during bolus administration of intravenous contrast. CONTRAST:  140mL OMNIPAQUE IOHEXOL 300 MG/ML  SOLN COMPARISON:  09/22/2018 FINDINGS: CT CHEST FINDINGS Cardiovascular: Heart and great vessels are unremarkable without pericardial effusion. Moderate atherosclerosis of the aorta and coronary vessels. Mediastinum/Nodes: No enlarged mediastinal, hilar, or axillary lymph nodes. Thyroid gland, trachea, and esophagus demonstrate no significant findings. Lungs/Pleura: Extensive background emphysema again noted. The cavitating pneumonia within the right lower lobe on prior study has resolved, with resulting scarring within the superior segment right lower lobe. There is a new rounded somewhat spiculated mass within the periphery of the right upper lobe, measuring 2.6 x 3.8 cm on image 26 of series 2. No effusion or pneumothorax. Central airways are patent. Musculoskeletal: There are no acute or destructive bony lesions.  Reconstructed images demonstrate no additional findings. CT ABDOMEN PELVIS FINDINGS Hepatobiliary: There is decreased attenuation throughout the majority of  the liver parenchyma consistent with hepatic steatosis. Higher attenuation within segment 7 likely reflects fatty sparing. No mass effect. The gallbladder is surgically absent. Pancreas: Unremarkable. No pancreatic ductal dilatation or surrounding inflammatory changes. Spleen: Normal in size without focal abnormality. Adrenals/Urinary Tract: 5 mm nonobstructing right renal calculus. There are numerous bilateral renal cortical cysts. The adrenals are unremarkable. Bladder is decompressed, which limits evaluation. Stomach/Bowel: No bowel obstruction or ileus. Diffuse diverticulosis of the distal colon without diverticulitis. No bowel wall thickening or inflammatory change. Vascular/Lymphatic: Extensive atherosclerosis of the abdominal aorta. There is aneurysmal dilatation of the right common iliac artery measuring up to 2.8 cm. No pathologic adenopathy. Reproductive: Prostate is unremarkable. Other: No free fluid or free gas. No abdominal wall hernia. Musculoskeletal: No acute or destructive bony lesions. Reconstructed images demonstrate no additional findings. IMPRESSION: 1. New rounded somewhat spiculated mass within the periphery of the right upper lobe, measuring 2.6 x 3.8 cm, concerning for malignancy. PET-CT may be useful for further evaluation. 2. Resolution of the previously seen cavitating pneumonia within the right lower lobe, with resulting scarring. 3. Hepatic steatosis, with fatty sparing in the posterior right lobe. 4. A 5 mm nonobstructing right renal calculus. 5. Aneurysmal dilatation of the right common iliac artery measuring up to 2.8 cm. 6. Aortic Atherosclerosis (ICD10-I70.0) and Emphysema (ICD10-J43.9). Electronically Signed   By: Randa Ngo M.D.   On: 08/20/2019 23:28   CT Abdomen Pelvis W Contrast  Result Date: 08/20/2019 CLINICAL DATA:  Altered level of consciousness, diffuse intracranial metastases with unknown primary malignancy EXAM: CT CHEST, ABDOMEN, AND PELVIS WITH CONTRAST  TECHNIQUE: Multidetector CT imaging of the chest, abdomen and pelvis was performed following the standard protocol during bolus administration of intravenous contrast. CONTRAST:  18mL OMNIPAQUE IOHEXOL 300 MG/ML  SOLN COMPARISON:  09/22/2018 FINDINGS: CT CHEST FINDINGS Cardiovascular: Heart and great vessels are unremarkable without pericardial effusion. Moderate atherosclerosis of the aorta and coronary vessels. Mediastinum/Nodes: No enlarged mediastinal, hilar, or axillary lymph nodes. Thyroid gland, trachea, and esophagus demonstrate no significant findings. Lungs/Pleura: Extensive background emphysema again noted. The cavitating pneumonia within the right lower lobe on prior study has resolved, with resulting scarring within the superior segment right lower lobe. There is a new rounded somewhat spiculated mass within the periphery of the right upper lobe, measuring 2.6 x 3.8 cm on image 26 of series 2. No effusion or pneumothorax. Central airways are patent. Musculoskeletal: There are no acute or destructive bony lesions. Reconstructed images demonstrate no additional findings. CT ABDOMEN PELVIS FINDINGS Hepatobiliary: There is decreased attenuation throughout the majority of the liver parenchyma consistent with hepatic steatosis. Higher attenuation within segment 7 likely reflects fatty sparing. No mass effect. The gallbladder is surgically absent. Pancreas: Unremarkable. No pancreatic ductal dilatation or surrounding inflammatory changes. Spleen: Normal in size without focal abnormality. Adrenals/Urinary Tract: 5 mm nonobstructing right renal calculus. There are numerous bilateral renal cortical cysts. The adrenals are unremarkable. Bladder is decompressed, which limits evaluation. Stomach/Bowel: No bowel obstruction or ileus. Diffuse diverticulosis of the distal colon without diverticulitis. No bowel wall thickening or inflammatory change. Vascular/Lymphatic: Extensive atherosclerosis of the abdominal aorta.  There is aneurysmal dilatation of the right common iliac artery measuring up to 2.8 cm. No pathologic adenopathy. Reproductive: Prostate is unremarkable. Other: No free fluid or free gas. No abdominal wall hernia. Musculoskeletal: No acute or destructive bony lesions. Reconstructed images demonstrate no additional  findings. IMPRESSION: 1. New rounded somewhat spiculated mass within the periphery of the right upper lobe, measuring 2.6 x 3.8 cm, concerning for malignancy. PET-CT may be useful for further evaluation. 2. Resolution of the previously seen cavitating pneumonia within the right lower lobe, with resulting scarring. 3. Hepatic steatosis, with fatty sparing in the posterior right lobe. 4. A 5 mm nonobstructing right renal calculus. 5. Aneurysmal dilatation of the right common iliac artery measuring up to 2.8 cm. 6. Aortic Atherosclerosis (ICD10-I70.0) and Emphysema (ICD10-J43.9). Electronically Signed   By: Randa Ngo M.D.   On: 08/20/2019 23:28       IMPRESSION/PLAN: 1. Metastatic cancer appearing to arise in the chest with brain metastases.  Dr. Lisbeth Renshaw is aware of the patient's case and is reviewed the patient's films.  We discussed the findings thus far, and the work-up that is headed towards a likely diagnosis of lung cancer.  The patient is going to proceed with further evaluation with biopsy. Pulmonary medicine does not recommend bronchoscopy with EBUS due to risks with being under anesthesia with his brain disease. We have reached out to IR to get their recommendations for site of biopsy. Once we have confirmation of his diagnosis, we would like to proceed with a palliative course of radiotherapy.  The sites would include whole brain radiation over 10 fractions, with the hopes of starting this Wednesday.  Given his hemoptysis and findings in the chest, we would also offer treatment to the site.  It would also likely be 10 fractions.  Dr. Lisbeth Renshaw is not confirmed this site however we will confirm  this together prior to simulation which is scheduled for tomorrow at 3 PM.  We appreciate their continuation of dexamethasone as well while the patient remains inpatient, and would recommend PPI prophylaxis which has been ordered.  Medical oncology will also see the patient during his admission.   In a visit lasting 60 minutes, greater than 50% of the time was spent by phone and in floor time discussing the patient's condition, in preparation for the discussion, and coordinating the patient's care.     Carola Rhine, PAC

## 2019-08-24 NOTE — Progress Notes (Signed)
Pharmacy Antibiotic Note  Ryan Coffey is a 86 y.o. male with hx RA (on MTX PTA) and recently diagnosed with metastatic lung cancer presented to the ED on 08/24/2019 with c/o weakness and fever.  Pharmacy has been consulted to dose vancomycin and cefepime for PNA.  Plan: - cefepime 1gm IV q12h - vancomycin 1000 mg IV x1, then 500 mg IV q12h for goal trough 15-20 __________________________________________  Height: 5\' 8"  (172.7 cm) Weight: 63.5 kg (140 lb) IBW/kg (Calculated) : 68.4  Temp (24hrs), Avg:100.4 F (38 C), Min:100.4 F (38 C), Max:100.4 F (38 C)  Recent Labs  Lab 08/20/19 1600 08/24/19 0522 08/24/19 0744  WBC 14.2* 13.2*  --   CREATININE 1.11 0.96  --   LATICACIDVEN  --  1.6 0.7    Estimated Creatinine Clearance: 51.4 mL/min (by C-G formula based on SCr of 0.96 mg/dL).    No Known Allergies    Thank you for allowing pharmacy to be a part of this patients care.  Lynelle Doctor 08/24/2019 9:19 AM

## 2019-08-24 NOTE — H&P (Addendum)
History and Physical    Ryan Coffey NGE:[id number] DOB: 03-20-1932 DOA: 08/24/2019  PCP: Prince Solian, MD  Patient coming from: home  I have personally briefly reviewed patient's old medical records in [place]  Chief Complaint: Fever and generalized weakness  HPI: Ryan Coffey is a 86 y.o. male with medical history significant of recently diagnosed right upper lung mass, COPD, panhypopituitarism, rheumatoid arthritis, hyperlipidemia, history of prostate cancer and renal cell carcinoma, thrombocytosis and immunocompromise state.  He presented to the ED early this morning with new onset fevers and generalized weakness.  Patient reported temp at home of 102.1, however EMS reported T-max 103.3.  Seen at bedside in the ED with daughter present who assisted with history.  Onset of fever was around 9 PM, daughter reports she noticed that he felt warm but said he felt okay.  Daughter went home, and patient called her around 1 AM feeling too weak to get up to the bathroom.  He also began having hemoptysis overnight, they estimate teaspoons in volume.  No prior hemoptysis, had a single episode here so far.  He reports associated generalized weakness, poor p.o. intake, unsteady gait but no falls, and recent weight loss of about 30 pounds over the past month or 2.  Daughter also reports that patient was recently noted to have some intermittent slurred speech.  He also states his vision has recently worsening in general, denies peripheral vision deficits, says it is just getting harder to read.  Denies any abdominal pain, nausea, vomiting, diarrhea, dysuria or urinary frequency, skin rashes or wounds.  He does not require home oxygen, did have it in the past following pneumonia.    Regarding his recent lung cancer diagnosis, patient was seen in the ED on 6/17 for slurred speech confusion and weight loss.  Head CT showed widespread brain metastases.  Subsequent imaging revealed the right-sided  lung mass.  He has upcoming appointment with [name] on 6/24, and PET scan scheduled for 6/28.  It appears tissue diagnosis is pending at this time.  Patient lives alone.  At baseline, patient is independent with ambulation but recently has been using his walker due to worsening weakness and feeling unsteady.   ED Course: Initial temp 100.4 after 1 g Tylenol given by EMS, heart rate 98, respirations 13, initial BP 97/58, O2 sat on room air was 88% which improved with 2 L/min nasal cannula oxygen.  CBC was notable for leukocytosis 13.2, platelets 468 and hemoglobin 12.2.  BMP showed mild hyponatremia 133, BUN 27 and glucose 141.  UA was negative.  Chest x-ray showed stable right upper lung and perihilar opacities without evidence of new airspace disease.  He was treated in the ED with Rocephin and azithromycin for presumed pneumonia given his clinical presentation in addition to IV fluids, cultures were drawn.  Admitted to hospitalist service on telemetry for further evaluation and management.    Review of Systems: As per HPI otherwise 10 point review of systems negative.    Past Medical History:  Diagnosis Date  . Arthritis   . Benign tumor of pituitary gland ([place])    "wasn't able to get it all"  . COPD (chronic obstructive pulmonary disease) ([place])   . Pneumonia 06/27/11   "first time"  . Shingles     Past Surgical History:  Procedure Laterality Date  . CHOLECYSTECTOMY OPEN  2010  . KIDNEY SURGERY  2011   "lesion removed right side"  . RENAL BIOPSY  2012  right  . TRANSPHENOIDAL / TRANSNASAL HYPOPHYSECTOMY / RESECTION PITUITARY TUMOR  ~ 1979   "they couldn't get it all"     reports that he quit smoking about 39 years ago. His smoking use included cigarettes. He has a 5.00 pack-year smoking history. He has never used smokeless tobacco. He reports current alcohol use. He reports that he does not use drugs.  No Known Allergies  Family History  Problem Relation Age of Onset  .  Heart disease Mother   . Cancer Brother   . Heart disease Brother      Prior to Admission medications   Medication Sig Start Date End Date Taking? Authorizing Provider  methotrexate (RHEUMATREX) 2.5 MG tablet TAKE 4 TABLETS ON SATURDAY AND 4 TABLETS ON SUNDAY. CAUTION:CHEMOTHERAPY. PROTECT FROM LIGHT. Patient taking differently: Take 10 mg by mouth 2 (two) times a week. TAKE 4 tablets on Saturday and 4 tablets on Sunday. Caution:Chemotherapy. Protect from light. 08/12/19   Bo Merino, MD  acetaminophen (TYLENOL) 325 MG tablet Take 650 mg by mouth 2 (two) times daily as needed (arthritis).    [provider]  bromocriptine (PARLODEL) 2.5 MG tablet Take 2.5 mg by mouth 2 (two) times daily.    [provider]  folic acid (FOLVITE) 1 MG tablet TAKE 2 TABLETS EVERY DAY Patient taking differently: Take 2 mg by mouth daily.  08/04/18   Bo Merino, MD  guaiFENesin-dextromethorphan (ROBITUSSIN DM) 100-10 MG/5ML syrup Take 10 mLs by mouth every 4 (four) hours as needed for cough. Patient not taking: Reported on 08/20/2019 09/25/18   Roxan Hockey, MD  hydrocortisone (CORTEF) 20 MG tablet Take 10-20 mg by mouth daily. 20 mg in the morning and 10mg  at night 11/02/16   [provider]  levothyroxine (SYNTHROID) 150 MCG tablet Take 150 mcg by mouth daily before breakfast.  07/29/19   [provider]  pantoprazole (PROTONIX) 40 MG tablet Take 1 tablet (40 mg total) by mouth daily. 09/25/18   Roxan Hockey, MD  pravastatin (PRAVACHOL) 20 MG tablet Take 20 mg by mouth daily.  07/24/19   [provider]  PROTONIX 20 MG tablet Take 20 mg by mouth daily.  07/29/19   [provider]  testosterone cypionate (DEPOTESTOSTERONE CYPIONATE) 200 MG/ML injection Inject 75 mg into the muscle every 14 (fourteen) days.  05/24/16   [provider]  zolpidem (AMBIEN CR) 6.25 MG CR tablet Take 1 tablet (6.25 mg total) by mouth at bedtime as needed for  sleep. Patient taking differently: Take 6.25 mg by mouth at bedtime.  05/20/18   Shelly Coss, MD    Physical Exam: Vitals:   08/24/19 0530 08/24/19 0600 08/24/19 0630 08/24/19 0940  BP: 94/76 (!) 101/53 (!) 99/51   Pulse: 90 80 75   Resp: 11 15 (!) 26   Temp:    99.2 F (37.3 C)  TempSrc:    Oral  SpO2: 96% 97% 97%   Weight:      Height:         Constitutional: NAD, calm, comfortable, underweight, frail Eyes: PERRL, EOMI, lids and conjunctivae normal, wearing glasses ENMT: Mucous membranes are moist. Posterior pharynx clear of any exudate or lesions.Normal dentition.  Hearing grossly normal. Respiratory: Right side with rhonchi and expiratory wheezing.  Left side is clear to auscultation. Normal respiratory effort. No accessory muscle use.  On 2 L/min nasal cannula oxygen. Cardiovascular: RRR, no murmurs / rubs / gallops. No extremity edema. 2+ pedal pulses.  Abdomen: Sunken abdomen, nontender, masses  or HSM palpated. +Bowel sounds.  Musculoskeletal: Ulnar deviation of fingers and joint hypertrophy, no clubbing / cyanosis. Normal muscle tone.  Skin: Mildly diaphoretic, intact, normal color Neurologic: CN 2-12 grossly intact. Normal speech.  No gross focal motor deficits. Psychiatric: Alert and oriented x 3. Normal mood. Congruent affect.  Normal judgement and insight.   Labs on Admission: I have personally reviewed following labs and imaging studies  CBC: Recent Labs  Lab 08/20/19 1600 08/24/19 0522  WBC 14.2* 13.2*  NEUTROABS 11.7* 10.9*  HGB 13.0 12.2*  HCT 41.2 38.8*  MCV 97.4 97.2  PLT 474* 710*   Basic Metabolic Panel: Recent Labs  Lab 08/20/19 1600 08/24/19 0522  NA 135 133*  K 4.7 3.7  CL 97* 97*  CO2 26 23  GLUCOSE 106* 141*  BUN 32* 27*  CREATININE 1.11 0.96  CALCIUM 9.6 8.9   GFR: Estimated Creatinine Clearance: 51.4 mL/min (by C-G formula based on SCr of 0.96 mg/dL). Liver Function Tests: Recent Labs  Lab 08/20/19 1600 08/24/19 0522  AST  21 19  ALT 12 12  ALKPHOS 64 61  BILITOT 1.0 0.8  PROT 8.0 7.1  ALBUMIN 3.9 3.4*   No results for input(s): LIPASE, AMYLASE in the last 168 hours. No results for input(s): AMMONIA in the last 168 hours. Coagulation Profile: Recent Labs  Lab 08/24/19 0521  INR 1.0   Cardiac Enzymes: No results for input(s): CKTOTAL, CKMB, CKMBINDEX, TROPONINI in the last 168 hours. BNP (last 3 results) Recent Labs    10/07/18 1110  PROBNP 973*   HbA1C: No results for input(s): HGBA1C in the last 72 hours. CBG: No results for input(s): GLUCAP in the last 168 hours. Lipid Profile: No results for input(s): CHOL, HDL, LDLCALC, TRIG, CHOLHDL, LDLDIRECT in the last 72 hours. Thyroid Function Tests: No results for input(s): TSH, T4TOTAL, FREET4, T3FREE, THYROIDAB in the last 72 hours. Anemia Panel: No results for input(s): VITAMINB12, FOLATE, FERRITIN, TIBC, IRON, RETICCTPCT in the last 72 hours. Urine analysis:    Component Value Date/Time   COLORURINE YELLOW 08/24/2019 0522   APPEARANCEUR CLEAR 08/24/2019 0522   LABSPEC 1.016 08/24/2019 0522   PHURINE 5.0 08/24/2019 0522   GLUCOSEU NEGATIVE 08/24/2019 0522   HGBUR SMALL (A) 08/24/2019 0522   BILIRUBINUR NEGATIVE 08/24/2019 0522   KETONESUR NEGATIVE 08/24/2019 0522   PROTEINUR NEGATIVE 08/24/2019 0522   UROBILINOGEN 1.0 05/23/2011 0953   NITRITE NEGATIVE 08/24/2019 0522   LEUKOCYTESUR NEGATIVE 08/24/2019 0522    Radiological Exams on Admission: DG Chest 2 View  Result Date: 08/24/2019 CLINICAL DATA:  Fever.  Probable lung cancer. EXAM: CHEST - 2 VIEW COMPARISON:  CT of the chest, abdomen, and pelvis 08/20/2019. FINDINGS: Heart size normal. Atherosclerotic changes are present at the aortic arch. Right upper lobe spiculated mass in perihilar opacities are again noted. No new airspace disease is present. IMPRESSION: 1. Stable right upper lobe mass and perihilar opacities. 2. No new airspace disease. 3. Atherosclerosis. Electronically Signed    By: San Morelle M.D.   On: 08/24/2019 07:03    EKG: Independently reviewed.  Normal sinus rhythm, 92 bpm, normal axis and normal intervals, no ST elevation.  Assessment/Plan Principal Problem:   Fever Active Problems:   Hemoptysis   Metastatic lung carcinoma, right (HCC)   Acute respiratory failure with hypoxia (HCC)   COPD (chronic obstructive pulmonary disease) (HCC)   Thrombocytosis (HCC)   Seropositive rheumatoid arthritis (HCC)   Panhypopituitarism (HCC)   Hyperlipidemia   Unintentional weight loss  Immunocompromised state ([place])   SIRS vs sepsis secondary to presumed pneumonia - POA with fever, leukocytosis, hypoxia.  Normal lactic acid x2.  No evidence of endorgan damage.  Was treated for sepsis with IV fluids and antibiotics in the ED.  Working diagnosis is pneumonia however not clear there is true infection.  Patient does not appear septic.    Fever - POA, with Tmax en route with EMS 103.3 F.  Chest x-ray showed stable right upper lobe and perihilar opacities without new airspace disease seen.  Urinalysis was negative.  Blood cultures are pending.  No GI, GU or dermatologic signs or symptoms.  It is possible that brain mets, if affecting thermoregulatory center, could be etiology, although less likely than infection.  Treating as presumed pneumonia with broad-spectrum antibiotics given his immunocompromise state.   --Vancomycin and cefepime  --Follow cultures --DuoNebs as needed --Antitussives --Incentive spirometer --Procal pending  Acute respiratory failure with hypoxia -present on admission due to PNA and lung mass, requiring 2 L/min nasal cannula oxygen.  Patient does have COPD but not on home oxygen at baseline.  Previously required it while recovering from pneumonia. --Continuous pulse ox --Supplemental oxygen, maintain O2 sat >90%  --Wean as tolerated  Hemoptysis - POA, new onset just prior to presentation.  Due to lung mass.  Monitor closely.  CBCs  daily.  SCDs for VTE prophylaxis.  Metastatic lung cancer, right-sided, with brain metastases -recent diagnosis.  CT head showed widespread brain mets estimated 40-50 lesions.  Has appointment coming up with [name] on 6/24.  PET scan is scheduled for 6/28.   --Pulmonology consulted to consider EBUS for tissue diagnosis --Oncology, Radiation Oncology and Palliative Care consulted --Decadron 4 mg BID for vasogenic edema  COPD -not in acute exacerbation on admission.  Expiratory wheezes are limited to the right side and secondary to his lung mass.  Left side is clear with good aeration.  Appears he is not on inhalers at home.  DuoNebs as above as needed.  Thrombocytosis - chronic. Monitor CBC.  Rheumatoid arthritis - no acutely flared.  Patient on methotrexate, next dose Saturday (6/26).  Continue folic acid.  Panhypopituitarism - no acute issues.  Next testosterone injection due Wednesday (6/23).  Continue hydrocortisone, Synthroid, bromocriptine.  Hyperlipidemia -continue pravastatin.  Unintentional weight loss -secondary to malignancy and poor p.o. intake.  Dietitian was consulted.  Immunocompromised state -due to malignancy and rheumatoid arthritis.  Broad-spectrum antibiotics as above.  Insomnia -continue home Ambien   DVT prophylaxis: Place and maintain sequential compression device Start: 08/24/19 0935   Code Status: DNR Confirmed with patient and daughter at bedside, patient has standing DNR.   Family Communication: Daughter at bedside during admission encounter, updated and all questions answered.  She is agreeable with the plan as outlined. Disposition Plan: Anticipate discharge home pending improvement and PT evaluation. Consults called: Oncology will be notified  Admission status:  Status is: Inpatient  Remains inpatient appropriate because:Inpatient level of care appropriate due to severity of illness   Dispo: The patient is from: Home              Anticipated  d/c is to: Home              Anticipated d/c date is: 3 days              Patient currently is not medically stable to d/c.    Ezekiel Slocumb, DO [facility]  08/24/2019, 9:52 AM    If 7PM-7AM, please  contact night-coverage. How to contact the [facility] Attending or Consulting provider [place] or covering provider during after hours [place], for this patient?    1. Check the care team in [facility] and look for a) attending/consulting [facility] provider listed and b) the [facility] team listed 2. Log into [email or web address] and use [place]'s universal password to access. If you do not have the password, please contact the hospital operator. 3. Locate the [facility] provider you are looking for under [facility] and page to a number that you can be directly reached. 4. If you still have difficulty reaching the provider, please page the [facility] (Director on Call) for the Hospitalists listed on amion for assistance.

## 2019-08-25 ENCOUNTER — Encounter (HOSPITAL_COMMUNITY): Payer: Self-pay | Admitting: Internal Medicine

## 2019-08-25 ENCOUNTER — Ambulatory Visit
Admit: 2019-08-25 | Discharge: 2019-08-25 | Disposition: A | Discharge status: Home / Self Care | Payer: Medicare Other | Attending: Radiation Oncology | Admitting: Radiation Oncology

## 2019-08-25 ENCOUNTER — Inpatient Hospital Stay (HOSPITAL_COMMUNITY): Payer: Medicare Other

## 2019-08-25 ENCOUNTER — Encounter: Payer: Self-pay | Admitting: *Deleted

## 2019-08-25 DIAGNOSIS — R918 Other nonspecific abnormal finding of lung field: Secondary | ICD-10-CM

## 2019-08-25 DIAGNOSIS — R042 Hemoptysis: Secondary | ICD-10-CM

## 2019-08-25 DIAGNOSIS — D473 Essential (hemorrhagic) thrombocythemia: Secondary | ICD-10-CM

## 2019-08-25 DIAGNOSIS — C7801 Secondary malignant neoplasm of right lung: Secondary | ICD-10-CM

## 2019-08-25 DIAGNOSIS — Z7189 Other specified counseling: Secondary | ICD-10-CM

## 2019-08-25 DIAGNOSIS — J449 Chronic obstructive pulmonary disease, unspecified: Secondary | ICD-10-CM

## 2019-08-25 DIAGNOSIS — E785 Hyperlipidemia, unspecified: Secondary | ICD-10-CM

## 2019-08-25 DIAGNOSIS — E230 Hypopituitarism: Secondary | ICD-10-CM

## 2019-08-25 DIAGNOSIS — M059 Rheumatoid arthritis with rheumatoid factor, unspecified: Secondary | ICD-10-CM

## 2019-08-25 DIAGNOSIS — Z515 Encounter for palliative care: Secondary | ICD-10-CM

## 2019-08-25 DIAGNOSIS — R634 Abnormal weight loss: Secondary | ICD-10-CM

## 2019-08-25 HISTORY — PX: IR US GUIDE BX ASP/DRAIN: IMG2392

## 2019-08-25 LAB — BASIC METABOLIC PANEL
Anion gap: 7 (ref 5–15)
BUN: 17 mg/dL (ref 8–23)
CO2: 25 mmol/L (ref 22–32)
Calcium: 8.5 mg/dL — ABNORMAL LOW (ref 8.9–10.3)
Chloride: 102 mmol/L (ref 98–111)
Creatinine, Ser: 0.51 mg/dL — ABNORMAL LOW (ref 0.61–1.24)
GFR calc Af Amer: >60 mL/min (ref >60)
GFR calc non Af Amer: >60 mL/min (ref >60)
Glucose, Bld: 167 mg/dL — ABNORMAL HIGH (ref 70–99)
Potassium: 4.4 mmol/L (ref 3.5–5.1)
Sodium: 134 mmol/L — ABNORMAL LOW (ref 135–145)

## 2019-08-25 LAB — CBC
HCT: 35 % — ABNORMAL LOW (ref 39.0–52.0)
Hemoglobin: 11.2 g/dL — ABNORMAL LOW (ref 13.0–17.0)
MCH: 31.1 pg (ref 26.0–34.0)
MCHC: 32 g/dL (ref 30.0–36.0)
MCV: 97.2 fL (ref 80.0–100.0)
Platelets: 412 10*3/uL — ABNORMAL HIGH (ref 150–400)
RBC: 3.6 MIL/uL — ABNORMAL LOW (ref 4.22–5.81)
RDW: 15 % (ref 11.5–15.5)
WBC: 6.2 10*3/uL (ref 4.0–10.5)
nRBC: 0 % (ref 0.0–0.2)

## 2019-08-25 LAB — MRSA PCR SCREENING: MRSA by PCR: NEGATIVE

## 2019-08-25 LAB — PROCALCITONIN: Procalcitonin: <0.1 ng/mL

## 2019-08-25 LAB — MAGNESIUM: Magnesium: 1.8 mg/dL (ref 1.7–2.4)

## 2019-08-25 MED ORDER — MIDAZOLAM HCL 2 MG/2ML IJ SOLN
INTRAMUSCULAR | Status: AC | PRN
  Administered 2019-08-25 (×2): 1 mg via INTRAVENOUS

## 2019-08-25 MED ORDER — GELATIN ABSORBABLE 12-7 MM EX MISC
CUTANEOUS | Status: AC
  Filled 2019-08-25: qty 1

## 2019-08-25 MED ORDER — FENTANYL CITRATE (PF) 100 MCG/2ML IJ SOLN
INTRAMUSCULAR | Status: AC | PRN
  Administered 2019-08-25 (×2): 50 ug via INTRAVENOUS

## 2019-08-25 MED ORDER — HYDROCORTISONE 10 MG PO TABS
10.0000 mg | ORAL_TABLET | Freq: Every day | ORAL | Status: DC
  Administered 2019-08-25 – 2019-08-27 (×3): 10 mg via ORAL
  Filled 2019-08-25 (×4): qty 1

## 2019-08-25 MED ORDER — ENOXAPARIN SODIUM 40 MG/0.4ML ~~LOC~~ SOLN
40.0000 mg | SUBCUTANEOUS | Status: DC
  Administered 2019-08-26 – 2019-08-28 (×3): 40 mg via SUBCUTANEOUS
  Filled 2019-08-25 (×3): qty 0.4

## 2019-08-25 MED ORDER — FENTANYL CITRATE (PF) 100 MCG/2ML IJ SOLN
INTRAMUSCULAR | Status: AC
  Filled 2019-08-25: qty 2

## 2019-08-25 MED ORDER — LIDOCAINE HCL 1 % IJ SOLN
INTRAMUSCULAR | Status: AC
  Filled 2019-08-25: qty 20

## 2019-08-25 MED ORDER — LIDOCAINE HCL 1 % IJ SOLN
INTRAMUSCULAR | Status: AC | PRN
  Administered 2019-08-25: 10 mL

## 2019-08-25 MED ORDER — PANTOPRAZOLE SODIUM 40 MG PO TBEC
40.0000 mg | DELAYED_RELEASE_TABLET | Freq: Two times a day (BID) | ORAL | Status: DC
  Administered 2019-08-25 – 2019-08-28 (×6): 40 mg via ORAL
  Filled 2019-08-25 (×6): qty 1

## 2019-08-25 MED ORDER — MIDAZOLAM HCL 2 MG/2ML IJ SOLN
INTRAMUSCULAR | Status: AC
  Filled 2019-08-25: qty 2

## 2019-08-25 NOTE — Progress Notes (Signed)
MEDICATION-RELATED CONSULT NOTE   IR Procedure Consult - Anticoagulant/Antiplatelet PTA/Inpatient Med List Review by Pharmacist    Procedure: Liver Biopsy 6/22    Completed: 11:09 am  Post-Procedural bleeding risk per IR MD assessment:  High  Antithrombotic medications on inpatient or PTA profile prior to procedure:   Lovenox 40mg  SQ daily    Recommended restart time per IR Post-Procedure Guidelines:  Day +1 in am   Other considerations:   Lovenox 40mg  daily resumed for 6/23 at [place]:   Lovenox 40mg  daily resumed for 6/23 at [place], [place] PharmD Pager [phone] 08/25/2019, 12:06 PM

## 2019-08-25 NOTE — Progress Notes (Signed)
Initial Nutrition Assessment  INTERVENTION:   -Ensure Enlive po BID, each supplement provides 350 kcal and 20 grams of protein -Magic cup TID with meals, each supplement provides 290 kcal and 9 grams of protein  NUTRITION DIAGNOSIS:   Increased nutrient needs related to chronic illness, cancer and cancer related treatments as evidenced by estimated needs.  GOAL:   Patient will meet greater than or equal to 90% of their needs  MONITOR:   PO intake, Supplement acceptance, Labs, Weight trends, I & O's  REASON FOR ASSESSMENT:   Consult Assessment of nutrition requirement/status  ASSESSMENT:   86 year old with history of seropositive rheumatoid arthritis, renal cell cancer in remission, hyperlipidemia, pituitary tumor.Right lower lobe cavitary pneumonia in July 2020 which improved on subsequent CT scansGeneralized weakness, fevers.  He had a recent ED visit for slurred speech, vision changes with findings on imaging concerning for new right upper lobe mass with metastatic lesions to brain.  Patient having biopsy this morning, not in room at time of visit. Pt was NPO earlier, now on regular diet. Ensure supplements have been ordered, will order additional Magic cups for kcals and protein.  Per MD note, palliative care to see patient for [place].   Per chart review, pt has had poor PO intakes PTA. Weight loss reported has fluctuated between 15-30 lbs over the past few months.  Per weight records, pt has lost 8 lbs since 12/30/18 (5% wt loss x 8 months, insignificant for time frame). Weight has been decreasing.  Medications: folic acid Labs reviewed: Low Na  NUTRITION - FOCUSED PHYSICAL EXAM:  In IR  Diet Order:   Diet Order            Diet regular Room service appropriate? Yes; Fluid consistency: Thin  Diet effective now                 EDUCATION NEEDS:   No education needs have been identified at this time  Skin:  Skin Assessment: Reviewed RN Assessment  Last BM:   6/21  Height:   Ht Readings from Last 1 Encounters:  08/24/19 5\' 8"  (1.727 m)    Weight:   Wt Readings from Last 1 Encounters:  08/24/19 63.5 kg   BMI:  Body mass index is 21.29 kg/m.  Estimated Nutritional Needs:   Kcal:  1900-2100  Protein:  90-100g  Fluid:  1.9L/day  Clayton Bibles, MS, RD, LDN Inpatient Clinical Dietitian Contact information available via Amion

## 2019-08-25 NOTE — Consult Note (Signed)
Chief Complaint: Liver lesion request is for liver biopsy  Referring Physician(s): A. Dara Lords [place]  Supervising Physician: Sandi Mariscal  Patient Status: [facility] - In-pt  History of Present Illness: Ryan Coffey is a 86 y.o. male History of right renal cell carcinoma s/p nephrectomy in 2011 found to have widespread metastatic disease of the brain, right lung and liver lesions. Team is requesting liver biopsy for tissue diagnosis.   Past Medical History:  Diagnosis Date  . Arthritis   . Benign tumor of pituitary gland ([place])    "wasn't able to get it all"  . COPD (chronic obstructive pulmonary disease) ([place])   . Pneumonia 06/27/11   "first time"  . Shingles     Past Surgical History:  Procedure Laterality Date  . CHOLECYSTECTOMY OPEN  2010  . KIDNEY SURGERY  2011   "lesion removed right side"  . RENAL BIOPSY  2012   right  . TRANSPHENOIDAL / TRANSNASAL HYPOPHYSECTOMY / RESECTION PITUITARY TUMOR  ~ 1979   "they couldn't get it all"    Allergies: Patient has no known allergies.  Medications: Prior to Admission medications   Medication Sig Start Date End Date Taking? Authorizing Provider  acetaminophen (TYLENOL) 325 MG tablet Take 650 mg by mouth 2 (two) times daily as needed (arthritis).   Yes [provider]  bromocriptine (PARLODEL) 2.5 MG tablet Take 2.5 mg by mouth 2 (two) times daily.   Yes [provider]  folic acid (FOLVITE) 1 MG tablet TAKE 2 TABLETS EVERY DAY Patient taking differently: Take 2 mg by mouth daily.  08/04/18  Yes Deveshwar, Abel Presto, MD  hydrocortisone (CORTEF) 20 MG tablet Take 10-20 mg by mouth daily. 20 mg in the morning and 10mg  at night 11/02/16  Yes [provider]  levothyroxine (SYNTHROID) 150 MCG tablet Take 150 mcg by mouth daily before breakfast.  07/29/19  Yes [provider]  methotrexate (RHEUMATREX) 2.5 MG tablet TAKE 4 TABLETS ON SATURDAY AND 4 TABLETS ON SUNDAY. CAUTION:CHEMOTHERAPY. PROTECT FROM  LIGHT. Patient taking differently: Take 10 mg by mouth 2 (two) times a week. TAKE 4 tablets on Saturday and 4 tablets on Sunday. Caution:Chemotherapy. Protect from light. 08/12/19  Yes Deveshwar, Abel Presto, MD  oxyCODONE (OXY IR/ROXICODONE) 5 MG immediate release tablet Take 5 mg by mouth every 6 (six) hours as needed for severe pain.   Yes [provider]  pantoprazole (PROTONIX) 40 MG tablet Take 1 tablet (40 mg total) by mouth daily. 09/25/18  Yes Emokpae, Courage, MD  pravastatin (PRAVACHOL) 20 MG tablet Take 20 mg by mouth daily.  07/24/19  Yes [provider]  testosterone cypionate (DEPOTESTOSTERONE CYPIONATE) 200 MG/ML injection Inject 75 mg into the muscle every 14 (fourteen) days.  05/24/16  Yes [provider]  zolpidem (AMBIEN CR) 6.25 MG CR tablet Take 1 tablet (6.25 mg total) by mouth at bedtime as needed for sleep. Patient taking differently: Take 6.25 mg by mouth at bedtime.  05/20/18  Yes Adhikari, Tamsen Meek, MD  guaiFENesin-dextromethorphan (ROBITUSSIN DM) 100-10 MG/5ML syrup Take 10 mLs by mouth every 4 (four) hours as needed for cough. Patient not taking: Reported on 08/20/2019 09/25/18   Roxan Hockey, MD     Family History  Problem Relation Age of Onset  . Heart disease Mother   . Cancer Brother   . Heart disease Brother     Social History   Socioeconomic History  . Marital status: Widowed    Spouse name: Not on file  . Number of  children: Not on file  . Years of education: Not on file  . Highest education level: Not on file  Occupational History  . Not on file  Tobacco Use  . Smoking status: Former Smoker    Packs/day: 0.50    Years: 10.00    Pack years: 5.00    Types: Cigarettes    Quit date: 08/03/1980    Years since quitting: 39.0  . Smokeless tobacco: Never Used  Vaping Use  . Vaping Use: Never used  Substance and Sexual Activity  . Alcohol use: Yes    Comment: wine occ  . Drug use: No  . Sexual activity: Yes  Other Topics Concern    . Not on file  Social History Narrative  . Not on file   Social Determinants of Health   Financial Resource Strain:   . Difficulty of Paying Living Expenses:   Food Insecurity:   . Worried About Charity fundraiser in the Last Year:   . Arboriculturist in the Last Year:   Transportation Needs:   . Film/video editor (Medical):   Marland Kitchen Lack of Transportation (Non-Medical):   Physical Activity:   . Days of Exercise per Week:   . Minutes of Exercise per Session:   Stress:   . Feeling of Stress :   Social Connections:   . Frequency of Communication with Friends and Family:   . Frequency of Social Gatherings with Friends and Family:   . Attends Religious Services:   . Active Member of Clubs or Organizations:   . Attends Archivist Meetings:   Marland Kitchen Marital Status:      Review of Systems: A 12 point ROS discussed and pertinent positives are indicated in the HPI above.  All other systems are negative.  Review of Systems  Constitutional: Negative for fever.  HENT: Negative for congestion.   Eyes: Positive for visual disturbance.  Respiratory: Negative for cough and shortness of breath.   Cardiovascular: Negative for chest pain.  Gastrointestinal: Negative for abdominal pain.  Neurological: Negative for headaches.  Psychiatric/Behavioral: Negative for behavioral problems and confusion.    Vital Signs: BP (!) 156/78 (BP Location: Left Arm)   Pulse 69   Temp 97.8 F (36.6 C)   Resp 20   Ht 5\' 8"  (1.727 m)   Wt 140 lb (63.5 kg)   SpO2 97%   BMI 21.29 kg/m   Physical Exam Vitals and nursing note reviewed.  Constitutional:      Appearance: He is well-developed.  HENT:     Head: Normocephalic.  Cardiovascular:     Rate and Rhythm: Normal rate and regular rhythm.     Pulses: Normal pulses.  Pulmonary:     Effort: Pulmonary effort is normal.     Breath sounds: Wheezing ( bilateral upper lobes) present.  Musculoskeletal:        General: Normal range of motion.      Cervical back: Normal range of motion.  Skin:    General: Skin is dry.  Neurological:     Mental Status: He is alert and oriented to person, place, and time.     Imaging: DG Chest 2 View  Result Date: 08/24/2019 CLINICAL DATA:  Fever.  Probable lung cancer. EXAM: CHEST - 2 VIEW COMPARISON:  CT of the chest, abdomen, and pelvis 08/20/2019. FINDINGS: Heart size normal. Atherosclerotic changes are present at the aortic arch. Right upper lobe spiculated mass in perihilar opacities are again noted. No new airspace  disease is present. IMPRESSION: 1. Stable right upper lobe mass and perihilar opacities. 2. No new airspace disease. 3. Atherosclerosis. Electronically Signed   By: San Morelle M.D.   On: 08/24/2019 07:03   DG Chest 2 View  Result Date: 08/20/2019 CLINICAL DATA:  Cough, left rib pain for 2 weeks EXAM: CHEST - 2 VIEW COMPARISON:  10/07/2018 FINDINGS: Frontal and lateral views of the chest demonstrate a stable cardiac silhouette. Persistent areas of scarring are seen within the superior segment right lower lobe. No new airspace disease, effusion, or pneumothorax. Significant background emphysema. Severe right shoulder osteoarthritis. IMPRESSION: 1. Stable scarring within the right lower lobe. 2. Emphysema. 3. No acute process. Electronically Signed   By: Randa Ngo M.D.   On: 08/20/2019 16:32   DG Ribs Unilateral Left  Result Date: 08/20/2019 CLINICAL DATA:  Cough, left rib pain for 2 weeks EXAM: LEFT RIBS - 2 VIEW COMPARISON:  10/07/2018 FINDINGS: Frontal and oblique views of the left thoracic cage are obtained. There are no acute displaced rib fractures. Visualized portions of the left lung are clear. Chronic scarring within the right lower lobe again noted. IMPRESSION: 1. No acute displaced fracture. Electronically Signed   By: Randa Ngo M.D.   On: 08/20/2019 16:34   CT Head Wo Contrast  Result Date: 08/20/2019 CLINICAL DATA:  Slurred speech and left facial droop.   Lethargy. EXAM: CT HEAD WITHOUT CONTRAST TECHNIQUE: Contiguous axial images were obtained from the base of the skull through the vertex without intravenous contrast. COMPARISON:  None. FINDINGS: Brain: Multiple brain masses disseminated throughout the cerebellum and both cerebral hemispheres consistent with advanced intracranial metastatic disease. Most of these are associated with petechial blood products. No frank hematoma. Many of the lesions are associated with vasogenic edema. There is no midline shift. No hydrocephalus or extra-axial collection. Index left cerebellar lesion image 8 measures 14 mm. Index right temporal lesion image 13 measures 28 mm. Index left occipital lesion image 16 measures 23 mm. Index left frontal lesion image 16 measures 28 mm. Index right frontoparietal lesion image 20 for measures 17 mm. Vascular: There is atherosclerotic calcification of the major vessels at the base of the brain. Skull: Previous trans-sphenoidal surgery. No lytic calvarial lesion. Sinuses/Orbits: Clear/normal Other: None IMPRESSION: Advanced widespread metastatic disease throughout the brain. In total, there are probably 40-50 lesions. Most are associated with petechial blood products. Many are associated with vasogenic edema. No midline shift. No hydrocephalus. Lung cancer is the most common primary tumor to present with this pattern. Distant trans-sphenoidal pituitary surgery. Electronically Signed   By: Nelson Chimes M.D.   On: 08/20/2019 15:48   CT Chest W Contrast  Result Date: 08/20/2019 CLINICAL DATA:  Altered level of consciousness, diffuse intracranial metastases with unknown primary malignancy EXAM: CT CHEST, ABDOMEN, AND PELVIS WITH CONTRAST TECHNIQUE: Multidetector CT imaging of the chest, abdomen and pelvis was performed following the standard protocol during bolus administration of intravenous contrast. CONTRAST:  167mL OMNIPAQUE IOHEXOL 300 MG/ML  SOLN COMPARISON:  09/22/2018 FINDINGS: CT CHEST  FINDINGS Cardiovascular: Heart and great vessels are unremarkable without pericardial effusion. Moderate atherosclerosis of the aorta and coronary vessels. Mediastinum/Nodes: No enlarged mediastinal, hilar, or axillary lymph nodes. Thyroid gland, trachea, and esophagus demonstrate no significant findings. Lungs/Pleura: Extensive background emphysema again noted. The cavitating pneumonia within the right lower lobe on prior study has resolved, with resulting scarring within the superior segment right lower lobe. There is a new rounded somewhat spiculated mass within the periphery of the  right upper lobe, measuring 2.6 x 3.8 cm on image 26 of series 2. No effusion or pneumothorax. Central airways are patent. Musculoskeletal: There are no acute or destructive bony lesions. Reconstructed images demonstrate no additional findings. CT ABDOMEN PELVIS FINDINGS Hepatobiliary: There is decreased attenuation throughout the majority of the liver parenchyma consistent with hepatic steatosis. Higher attenuation within segment 7 likely reflects fatty sparing. No mass effect. The gallbladder is surgically absent. Pancreas: Unremarkable. No pancreatic ductal dilatation or surrounding inflammatory changes. Spleen: Normal in size without focal abnormality. Adrenals/Urinary Tract: 5 mm nonobstructing right renal calculus. There are numerous bilateral renal cortical cysts. The adrenals are unremarkable. Bladder is decompressed, which limits evaluation. Stomach/Bowel: No bowel obstruction or ileus. Diffuse diverticulosis of the distal colon without diverticulitis. No bowel wall thickening or inflammatory change. Vascular/Lymphatic: Extensive atherosclerosis of the abdominal aorta. There is aneurysmal dilatation of the right common iliac artery measuring up to 2.8 cm. No pathologic adenopathy. Reproductive: Prostate is unremarkable. Other: No free fluid or free gas. No abdominal wall hernia. Musculoskeletal: No acute or destructive bony  lesions. Reconstructed images demonstrate no additional findings. IMPRESSION: 1. New rounded somewhat spiculated mass within the periphery of the right upper lobe, measuring 2.6 x 3.8 cm, concerning for malignancy. PET-CT may be useful for further evaluation. 2. Resolution of the previously seen cavitating pneumonia within the right lower lobe, with resulting scarring. 3. Hepatic steatosis, with fatty sparing in the posterior right lobe. 4. A 5 mm nonobstructing right renal calculus. 5. Aneurysmal dilatation of the right common iliac artery measuring up to 2.8 cm. 6. Aortic Atherosclerosis (ICD10-I70.0) and Emphysema (ICD10-J43.9). Electronically Signed   By: Randa Ngo M.D.   On: 08/20/2019 23:28   MR ABDOMEN W WO CONTRAST  Result Date: 08/25/2019 CLINICAL DATA:  86 year old male with history of abdominal mass. Suspected neoplasm. EXAM: MRI ABDOMEN WITHOUT AND WITH CONTRAST TECHNIQUE: Multiplanar multisequence MR imaging of the abdomen was performed both before and after the administration of intravenous contrast. CONTRAST:  110mL GADAVIST GADOBUTROL 1 MMOL/ML IV SOLN COMPARISON:  No prior abdominal MRI. CT the abdomen and pelvis 08/20/2019. FINDINGS: Lower chest: Unremarkable. Hepatobiliary: In the inferior aspect of segment 5 of the liver (axial image 56 of series 24) there is a 1.4 x 1.4 cm T1 hypointense, T2 iso to slightly hyperintense lesion which demonstrates some low-level internal enhancement, concerning for neoplasm. In segment 3 of the liver (axial image 43 of series 24) there is a subtle T1 hypointense, T2 isointense lesion which does not demonstrate definitive internal enhancement on post gadolinium images (although assessment is limited by the small size of the lesion and motion artifact which affects subtraction imaging). Status post cholecystectomy. No intrahepatic biliary ductal dilatation. Common bile duct measures 6 mm in the porta hepatis. However, the common hepatic duct is mildly dilated  measuring up to 14 mm in diameter, and there multiple filling defects in the common hepatic duct, compatible with choledocholithiasis, largest of which measures up to 11 mm (axial image 19 of series 25). Pancreas: No pancreatic mass. No pancreatic ductal dilatation. No pancreatic or peripancreatic fluid collections or inflammatory changes. Spleen:  Unremarkable. Adrenals/Urinary Tract: There are multiple T1 hypointense, T2 hyperintense, nonenhancing lesions associated with both kidneys, largest of which is exophytic extending off the lower pole of the right kidney measuring up to 4.8 x 4.1 cm in diameter, compatible with simple cysts. No suspicious renal lesions are noted. No hydroureteronephrosis in the visualized portions of the abdomen. Bilateral adrenal glands are normal in  appearance. Stomach/Bowel: Visualized portions are unremarkable. Vascular/Lymphatic: Aortic atherosclerosis, with aneurysmal dilatation of the right common iliac artery which measures up to 2.6 cm in diameter. No lymphadenopathy noted in the abdomen. Other: No significant volume of ascites noted in the visualized portions of the peritoneal cavity. Musculoskeletal: No aggressive appearing osseous lesions are noted in the visualized portions of the skeleton. IMPRESSION: 1. There are 2 liver lesions. One of these, in the inferior aspect of segment 5 of the liver, has imaging characteristics concerning for malignancy, potentially a metastatic lesion. Other lesion in segment 3 is too small to definitively characterize, but does not appear to be a simple cyst or other clearly benign lesion. 2. Status post cholecystectomy with choledocholithiasis in the common hepatic duct. Although the common hepatic duct is mildly dilated, there is no other intrahepatic biliary ductal dilatation. Common bile duct is normal in caliber measuring 6 mm in the porta hepatis. 3. Aortic atherosclerosis, as well as aneurysmal dilatation of the right common iliac artery  which measures up to 2.6 cm in diameter. e Electronically Signed   By: Vinnie Langton M.D.   On: 08/25/2019 08:26   CT Abdomen Pelvis W Contrast  Result Date: 08/20/2019 CLINICAL DATA:  Altered level of consciousness, diffuse intracranial metastases with unknown primary malignancy EXAM: CT CHEST, ABDOMEN, AND PELVIS WITH CONTRAST TECHNIQUE: Multidetector CT imaging of the chest, abdomen and pelvis was performed following the standard protocol during bolus administration of intravenous contrast. CONTRAST:  153mL OMNIPAQUE IOHEXOL 300 MG/ML  SOLN COMPARISON:  09/22/2018 FINDINGS: CT CHEST FINDINGS Cardiovascular: Heart and great vessels are unremarkable without pericardial effusion. Moderate atherosclerosis of the aorta and coronary vessels. Mediastinum/Nodes: No enlarged mediastinal, hilar, or axillary lymph nodes. Thyroid gland, trachea, and esophagus demonstrate no significant findings. Lungs/Pleura: Extensive background emphysema again noted. The cavitating pneumonia within the right lower lobe on prior study has resolved, with resulting scarring within the superior segment right lower lobe. There is a new rounded somewhat spiculated mass within the periphery of the right upper lobe, measuring 2.6 x 3.8 cm on image 26 of series 2. No effusion or pneumothorax. Central airways are patent. Musculoskeletal: There are no acute or destructive bony lesions. Reconstructed images demonstrate no additional findings. CT ABDOMEN PELVIS FINDINGS Hepatobiliary: There is decreased attenuation throughout the majority of the liver parenchyma consistent with hepatic steatosis. Higher attenuation within segment 7 likely reflects fatty sparing. No mass effect. The gallbladder is surgically absent. Pancreas: Unremarkable. No pancreatic ductal dilatation or surrounding inflammatory changes. Spleen: Normal in size without focal abnormality. Adrenals/Urinary Tract: 5 mm nonobstructing right renal calculus. There are numerous  bilateral renal cortical cysts. The adrenals are unremarkable. Bladder is decompressed, which limits evaluation. Stomach/Bowel: No bowel obstruction or ileus. Diffuse diverticulosis of the distal colon without diverticulitis. No bowel wall thickening or inflammatory change. Vascular/Lymphatic: Extensive atherosclerosis of the abdominal aorta. There is aneurysmal dilatation of the right common iliac artery measuring up to 2.8 cm. No pathologic adenopathy. Reproductive: Prostate is unremarkable. Other: No free fluid or free gas. No abdominal wall hernia. Musculoskeletal: No acute or destructive bony lesions. Reconstructed images demonstrate no additional findings. IMPRESSION: 1. New rounded somewhat spiculated mass within the periphery of the right upper lobe, measuring 2.6 x 3.8 cm, concerning for malignancy. PET-CT may be useful for further evaluation. 2. Resolution of the previously seen cavitating pneumonia within the right lower lobe, with resulting scarring. 3. Hepatic steatosis, with fatty sparing in the posterior right lobe. 4. A 5 mm nonobstructing right renal  calculus. 5. Aneurysmal dilatation of the right common iliac artery measuring up to 2.8 cm. 6. Aortic Atherosclerosis (ICD10-I70.0) and Emphysema (ICD10-J43.9). Electronically Signed   By: Randa Ngo M.D.   On: 08/20/2019 23:28    Labs:  CBC: Recent Labs    08/12/19 1001 08/20/19 1600 08/24/19 0522 08/25/19 0317  WBC 9.4 14.2* 13.2* 6.2  HGB 12.4* 13.0 12.2* 11.2*  HCT 37.5* 41.2 38.8* 35.0*  PLT 441* 474* 460* 412*    COAGS: Recent Labs    09/18/18 1228 08/24/19 0521  INR 1.2 1.0  APTT 31 34    BMP: Recent Labs    08/12/19 1001 08/20/19 1600 08/24/19 0522 08/25/19 0317  NA 137 135 133* 134*  K 4.3 4.7 3.7 4.4  CL 102 97* 97* 102  CO2 28 26 23 25   GLUCOSE 73 106* 141* 167*  BUN 17 32* 27* 17  CALCIUM 9.1 9.6 8.9 8.5*  CREATININE 0.75 1.11 0.96 0.51*  GFRNONAA 84 >60 >60 >60  GFRAA 98 >60 >60 >60    LIVER  FUNCTION TESTS: Recent Labs    09/20/18 0320 09/20/18 0320 10/07/18 1110 12/09/18 1503 05/07/19 1140 08/12/19 1001 08/20/19 1600 08/24/19 0522  BILITOT 0.5   < > 0.5   < > 0.7 0.6 1.0 0.8  AST 29   < > 21   < > 12 12 21 19   ALT 19   < > 18   < > 7* 6* 12 12  ALKPHOS 72  --  88  --   --   --  64 61  PROT 4.7*   < > 6.7   < > 6.4 6.4 8.0 7.1  ALBUMIN 1.5*  --  3.1*  --   --   --  3.9 3.4*   < > = values in this interval not displayed.    Assessment and Plan:   86 y.o, male inpatient. History of right renal cell carcinoma s/p nephrectomy in 2011 found to have widespread metastatic disease of the brain, right lung and liver lesions. Team is requesting liver biopsy for tissue diagnosis.  Pertinent Imaging 6.22.21 - MR Abdomen reads There are 2 liver lesions. One of these, in the inferior aspect of segment 5 of the liver, has imaging characteristics concerning for malignancy, potentially a metastatic lesion. Other lesion in segment 3 is too small to definitively characterize, but does not appear to be a simple cyst or other clearly benign lesion  Pertinent IR History none  Pertinent Allergies NKDA   All labs and medications are within acceptable parameters.  Patient is afebrile.  Risks and benefits of liver biopsy  was discussed with the patient and/or patient's family including, but not limited to bleeding, infection, damage to adjacent structures or low yield requiring additional tests.  All of the questions were answered and there is agreement to proceed.  Consent signed and in chart.    Thank you for this interesting consult.  I greatly enjoyed meeting Ryan Coffey and look forward to participating in their care.  A copy of this report was sent to the requesting provider on this date.  Electronically Signed: Avel Peace, NP 08/25/2019, 10:12 AM   I spent a total of 40 Minutes    in face to face in clinical consultation, greater than 50% of which was  counseling/coordinating care for liver biopsy

## 2019-08-25 NOTE — Progress Notes (Signed)
   NAME:  Ryan Coffey, MRN:  [id number], DOB:  26-Jul-1932, LOS: 1 ADMISSION DATE:  08/24/2019, CONSULTATION DATE:  08/24/2019 REFERRING MD:  Nicole Kindred MD, CHIEF COMPLAINT:  Lung mass  Brief History   86 year old with history of seropositive rheumatoid arthritis, renal cell cancer in remission, hyperlipidemia, pituitary tumor. Right lower lobe cavitary pneumonia in July 2020 which improved on subsequent CT scans Generalized weakness, fevers.  He had a recent ED visit for slurred speech, vision changes with findings on imaging concerning for new right upper lobe mass with metastatic lesions to brain.  Past Medical History    has a past medical history of Arthritis, Benign tumor of pituitary gland ([place]), COPD (chronic obstructive pulmonary disease) ([place]), Pneumonia (06/27/11), and Shingles.  Significant Hospital Events   6/21 Admit  Consults:    Procedures:    Significant Diagnostic Tests:  CT head, chest abdomen pelvis 6/17-new spiculated mass in the right upper lobe, resolution of cavitary pneumonia in the right lower lobe.  Advanced metastatic disease throughout the brain.  MRI abdomen 6/31/21-2 liver lesions concerning for metastasis  Micro Data:    Antimicrobials:  Vanco Cefepime  Interim history/subjective:   Underwent IR guided biopsy of the liver.  No new complaints.  States that vision is better today  Objective   Blood pressure (!) 117/57, pulse 81, temperature 97.9 F (36.6 C), resp. rate 20, height 5\' 8"  (1.727 m), weight 63.5 kg, SpO2 92 %.        Intake/Output Summary (Last 24 hours) at 08/25/2019 1656 Last data filed at 08/25/2019 5102 Gross per 24 hour  Intake 1348.73 ml  Output 300 ml  Net 1048.73 ml   Filed Weights   08/24/19 0509  Weight: 63.5 kg    Examination: Blood pressure (!) 117/57, pulse 81, temperature 97.9 F (36.6 C), resp. rate 20, height 5\' 8"  (1.727 m), weight 63.5 kg, SpO2 92 %. Gen:      No acute distress HEENT:  EOMI,  sclera anicteric Neck:     No masses; no thyromegaly Lungs:    Clear to auscultation bilaterally; normal respiratory effort CV:         Regular rate and rhythm; no murmurs Abd:      + bowel sounds; soft, non-tender; no palpable masses, no distension Ext:    No edema; adequate peripheral perfusion Skin:      Warm and dry; no rash Neuro: alert and oriented x 3 Psych: normal mood and affect  Resolved Hospital Problem list     Assessment & Plan:  Right lung mass with brain lesions Findings highly suggestive of metastatic stage IV cancer He is high risk for navigational biopsy of the lung under general anesthesia Underwent IR guided biopsy of liver lesion which hopefully will get the diagnosis Scheduled to undergo brain radiation and lung radiation Medical oncology and palliative are seeing the patient.   We will follow up with him in clinic.  Will be available during this admission as needed. Discussed with patient and daughters at bedside  Critical care time: NA   Marshell Garfinkel MD [facility] Please see [email or web address] for pager details.  08/25/2019, 4:56 PM

## 2019-08-25 NOTE — Consult Note (Signed)
Consultation Note Date: 08/25/2019   Patient Name: Ryan Coffey  DOB: 03-31-1932  MRN: [id number]  Age / Sex: 86 y.o., male  PCP: Prince Solian, MD Referring Physician: Kerney Elbe, DO  Reason for Consultation: Establishing goals of care  HPI/Patient Profile: 86 y.o. male  with past medical history of recently diagnosed right upper lung mass, COPD, panhypopituitarism, rheumatoid arthritis, hyperlipidemia, history of prostate and renal cell carcinoma admitted on 08/24/2019 with fever, generalized weakness, slurred speech, hemoptysis. Found to have widespread brain mets and liver mets with known right upper lung mass. No signs of clear infection source and symptoms attributed to metastatic process.   Clinical Assessment and Goals of Care: I met today with Ryan Coffey as well as his 2 daughters at bedside. Ryan Coffey is alert and oriented and able to participate in conversation and goals of care. They all have good understanding of severity of diagnosis and poor prognosis although this is still a shock as Ryan Coffey had struggling with pneumonia 3 times last year but has most recently been doing very well. He just drove himself to [place] this past weekend.   He has responded very well to steroids and confusion much improved per family. Still with some vision changes but hopeful that he will have favorable changes to brain radiation scheduled for this afternoon. They are all onboard for radiation with hopes of improvement in symptoms and quality of life. After further discussion he admits that he is leaning towards completing radiation but less interested in chemotherapy or aggressive treatment that could cause worsening quality of life. He would like to take one day at a time and keep his options open. I reassured him that we will respect his wishes and it is within his rights to terminate any treatment  if the consequences/side effects outweigh the benefits at any stage as well. Today we agree to proceed with radiation therapy for now and see how this goes for him.   Of further concern is that both daughters still work and live in [place] and [place] as Ryan Coffey lives independently here in [place]. They have been discussing trial of rehab as an option from here as well. They are working on applications for [employer] and I will assist them in any way possible to ensure they have the time they need to be with their father.   Will follow up again tomorrow. All questions/concerns addressed. Emotional support provided.   Primary Decision Maker PATIENT    SUMMARY OF RECOMMENDATIONS   - Plans for brain radiation - Still considering wishes for other potential treatment options  Code Status/Advance Care Planning:  DNR   Symptom Management:   Good response to steroids.   Palliative Prophylaxis:   Delirium Protocol and Frequent Pain Assessment  Psycho-social/Spiritual:   Desire for further Chaplaincy support:yes  Additional Recommendations: Caregiving  Support/Resources  Prognosis:   Overall prognosis poor with newly diagnosed advanced incurable cancer. Still awaiting biopsy results.   Discharge Planning: To Be Determined  Primary Diagnoses: Present on Admission: . Fever . Thrombocytosis ([place]) . COPD (chronic obstructive pulmonary disease) ([place]) . Hemoptysis . Seropositive rheumatoid arthritis ([place]) . Hyperlipidemia . Unintentional weight loss . Panhypopituitarism ([place]) . Immunocompromised state ([place]) . Metastatic lung carcinoma, right ([place]) . Acute respiratory failure with hypoxia ([place])   I have reviewed the medical record, interviewed the patient and family, and examined the patient. The following aspects are pertinent.  Past Medical History:  Diagnosis Date  . Arthritis   . Benign tumor of pituitary gland ([place])    "wasn't able to get it all"  . COPD  (chronic obstructive pulmonary disease) ([place])   . Pneumonia 06/27/11   "first time"  . Shingles    Social History   Socioeconomic History  . Marital status: Widowed    Spouse name: Not on file  . Number of children: Not on file  . Years of education: Not on file  . Highest education level: Not on file  Occupational History  . Not on file  Tobacco Use  . Smoking status: Former Smoker    Packs/day: 0.50    Years: 10.00    Pack years: 5.00    Types: Cigarettes    Quit date: 08/03/1980    Years since quitting: 39.0  . Smokeless tobacco: Never Used  Vaping Use  . Vaping Use: Never used  Substance and Sexual Activity  . Alcohol use: Yes    Comment: wine occ  . Drug use: No  . Sexual activity: Yes  Other Topics Concern  . Not on file  Social History Narrative  . Not on file   Social Determinants of Health   Financial Resource Strain:   . Difficulty of Paying Living Expenses:   Food Insecurity:   . Worried About Charity fundraiser in the Last Year:   . Arboriculturist in the Last Year:   Transportation Needs:   . Film/video editor (Medical):   Marland Kitchen Lack of Transportation (Non-Medical):   Physical Activity:   . Days of Exercise per Week:   . Minutes of Exercise per Session:   Stress:   . Feeling of Stress :   Social Connections:   . Frequency of Communication with Friends and Family:   . Frequency of Social Gatherings with Friends and Family:   . Attends Religious Services:   . Active Member of Clubs or Organizations:   . Attends Archivist Meetings:   Marland Kitchen Marital Status:    Family History  Problem Relation Age of Onset  . Heart disease Mother   . Cancer Brother   . Heart disease Brother    Scheduled Meds: . bromocriptine  2.5 mg Oral BID  . dexamethasone  4 mg Oral Q12H  . [START ON 08/26/2019] enoxaparin (LOVENOX) injection  40 mg Subcutaneous Q24H  . feeding supplement (ENSURE ENLIVE)  237 mL Oral BID BM  . fentaNYL      . folic acid  2 mg Oral  Daily  . gelatin adsorbable      . hydrocortisone  10 mg Oral Q supper  . hydrocortisone  20 mg Oral Daily  . levothyroxine  150 mcg Oral QAC breakfast  . lidocaine      . midazolam      . pantoprazole  40 mg Oral BID  . pravastatin  20 mg Oral q1800   Continuous Infusions: PRN Meds:.acetaminophen **OR** acetaminophen, bisacodyl, guaiFENesin-dextromethorphan, ipratropium-albuterol, ondansetron **OR** ondansetron (ZOFRAN) IV, oxyCODONE, polyethylene glycol, zolpidem No Known  Allergies Review of Systems  Respiratory: Negative for shortness of breath.   Neurological:       Vision changes Slurred speech much improved    Physical Exam Vitals and nursing note reviewed.  Constitutional:      General: He is not in acute distress. Cardiovascular:     Rate and Rhythm: Normal rate.  Pulmonary:     Effort: Pulmonary effort is normal. No tachypnea, accessory muscle usage or respiratory distress.  Abdominal:     General: Abdomen is flat.     Palpations: Abdomen is soft.  Neurological:     Mental Status: He is alert and oriented to person, place, and time.     Vital Signs: BP (!) 117/57 (BP Location: Left Arm) Comment (BP Location): left arm  Pulse 81   Temp 97.9 F (36.6 C) Comment (Src): oral  Resp 20   Ht 5' 8" (1.727 m)   Wt 63.5 kg   SpO2 92%   BMI 21.29 kg/m  Pain Scale: Faces   Pain Score: 0-No pain   SpO2: SpO2: 92 % O2 Device:SpO2: 92 % O2 Flow Rate: .O2 Flow Rate (L/min): 2 L/min  IO: Intake/output summary:   Intake/Output Summary (Last 24 hours) at 08/25/2019 1512 Last data filed at 08/25/2019 1610 Gross per 24 hour  Intake 1348.73 ml  Output 300 ml  Net 1048.73 ml    LBM: Last BM Date: 08/24/19 Baseline Weight: Weight: 63.5 kg Most recent weight: Weight: 63.5 kg     Palliative Assessment/Data:     Time In: 1415 Time Out: 1505 Time Total: 50 min Greater than 50%  of this time was spent counseling and coordinating care related to the above  assessment and plan.  Signed by: Vinie Sill, NP Palliative Medicine Team Pager # [phone] (M-F 8a-5p) Team Phone # [phone] (Nights/Weekends)

## 2019-08-25 NOTE — Progress Notes (Signed)
I was able to speak with the patient's two daughters today. Much has transpired since yesterday afternoon. The patient was not felt to be a good candidate for bronchoscopy and EBUS with general anesthesia due to his brain disease and risks of herniation.  Dr. Shon Baton IR was contacted and reviewed the patient's case, the ability to biopsy his lung disease was felt to be quite risky as well, but Dr. Pascal Lux found by CT imaging a possible lesion in the liver. MRI of the abdomen to specifically liver the liver was performed overnight, which revealed a lesion in segment 5 of the liver measuring 14 x 14 mm suspicious for disease, and a second possible lesion is too small to characterize but does not appear to be a simple cyst or clearly a benign lesion.  Dr. Pascal Lux communicated with the team that he felt like the patient would be a good candidate for an ultrasound-guided biopsy of the liver which was performed this morning while I spoke with his daughters.  We summarized the recommendations for radiotherapy to the chest and brain both over 10 fractions with palliative intent.  He will simulate this afternoon, they confirmed that he is interested and they want him to proceed in this manner as well.  We reviewed the risks, benefits, short and long-term effects of radiotherapy, and the rationale for holding methotrexate.  I will reach out to his rheumatologist and since he is currently receiving oral steroids, we will do a more slow taper with the hopes of temporizing his RA until he can get back to taking methotrexate a week following his completion of radiotherapy which I suspect would be September 19, 2019 when he could resume this.      Carola Rhine, PAC

## 2019-08-25 NOTE — Consult Note (Addendum)
[name]  Telephone:([phone]) [phone] [phone]   [name]  Referral MD: Ryan Coffey  Reason for Referral: Right upper lobe lung mass, widespread brain metastases  HPI: Ryan Coffey is an 86 year old male with a past medical history significant for COPD, panhypopituitarism, rheumatoid arthritis, hyperlipidemia, history of renal cell carcinoma status post nephrectomy in 2009, thrombocytosis.  The patient presented to the emergency room with new onset of fevers and generalized weakness.  The patient reported to a temperature up to 102.1 however EMS reported a temp of 103.3.  Fever onset was several hours prior to coming to the emergency room.  The patient also reported a single episode of hemoptysis estimated to be teaspoons in volume.  The patient has had a poor appetite as well as weight loss of about 30 pounds over the past 1 to 2 months.  He has also had some intermittent slurred speech and worsening of his vision.  The patient was recently seen in the emergency room on 08/20/2019 for slurred speech, confusion, and weight loss.  He had a CT of the head without contrast which showed advanced, widespread metastatic disease throughout the brain with approximately 40-50 lesions.  Most of these lesions were associated with petechial blood products and also associate with vasogenic edema.  The patient was referred by the emergency room to [name] neuro-oncology and [name] of medical oncology.  The patient's pulmonologist had ordered a PET scan which has not yet been completed.  The patient has been seen by radiation oncology during this hospitalization who is planning to begin whole brain radiation and possibly radiation to the lung mass.  The patient has also been seen by pulmonology who is considering navigational bronchoscope biopsy versus CT-guided biopsy to make the diagnosis.  However, given his frailty, both of these could be risky.   They recommend PET scan prior to biopsy.  The patient had an MRI of the abdomen with and without contrast performed earlier today which showed 2 liver lesions.  1 of these in the inferior aspect of segment 5 of the liver has characteristics concerning for malignancy and is potentially a metastatic lesion.  The other lesion is too small to characterize.  The patient was seen today with his 2 daughters at the bedside.  The patient reports generalized weakness.  He is having vision changes but no headaches or dizziness.  His family has noticed that he has been forgetful and has intermittent slurred speech and a drooping mouth.  He has been unsteady on his feet.  He reports that his appetite is fair and that he makes himself eat.  He reports that he has lost about 15 pounds in the past 4 to 6 weeks.  He had fevers at home.  Denies chest pain and shortness of breath.  He has been having hemoptysis for about 10 days.  Coughs up a few teaspoons of blood every time he coughs.  Denies abdominal pain, nausea, vomiting, constipation, diarrhea.  He has not noticed any other bleeding other than hemoptysis.  No lower extremity edema.  The patient is widowed.  His wife died from Alzheimer's disease in November 2020.  The patient lives alone but has 2 daughters. Reports that he drinks wine almost daily.  Has a history of half pack of cigarettes per day for about 10 years.  He quit in 1982.  Family history significant for a mother with heart disease and a brother with cancer and heart disease.  Medical Oncology has been asked to see the patient to make recommendations regarding his lung mass and brain metastases.     Past Medical History:  Diagnosis Date   Arthritis    Benign tumor of pituitary gland ([place])    "wasn't able to get it all"   COPD (chronic obstructive pulmonary disease) ([place])    Pneumonia 06/27/11   "first time"   Shingles   :  Past Surgical History:  Procedure Laterality Date   CHOLECYSTECTOMY  OPEN  2010   KIDNEY SURGERY  2011   "lesion removed right side"   RENAL BIOPSY  2012   right   TRANSPHENOIDAL / TRANSNASAL HYPOPHYSECTOMY / RESECTION PITUITARY TUMOR  ~ 1979   "they couldn't get it all"  :  Current Facility-Administered Medications  Medication Dose Route Frequency Provider Last Rate Last Admin   acetaminophen (TYLENOL) tablet 650 mg  650 mg Oral Q6H PRN Nicole Kindred A, DO       Or   acetaminophen (TYLENOL) suppository 650 mg  650 mg Rectal Q6H PRN Nicole Kindred A, DO       bisacodyl (DULCOLAX) EC tablet 5 mg  5 mg Oral Daily PRN Nicole Kindred A, DO       bromocriptine (PARLODEL) tablet 2.5 mg  2.5 mg Oral BID Nicole Kindred A, DO   2.5 mg at 08/25/19 1002   ceFEPIme (MAXIPIME) 2 g in sodium chloride 0.9 % 100 mL IVPB  2 g Intravenous Q12H Pham, Anh P, RPH 200 mL/hr at 08/24/19 2330 2 g at 08/24/19 2330   dexamethasone (DECADRON) tablet 4 mg  4 mg Oral Q12H Nicole Kindred A, DO   4 mg at 08/25/19 1003   feeding supplement (ENSURE ENLIVE) (ENSURE ENLIVE) liquid 237 mL  237 mL Oral BID BM Nicole Kindred A, DO       folic acid (FOLVITE) tablet 2 mg  2 mg Oral Daily Nicole Kindred A, DO   2 mg at 08/25/19 1004   guaiFENesin-dextromethorphan (ROBITUSSIN DM) 100-10 MG/5ML syrup 5 mL  5 mL Oral Q4H PRN Nicole Kindred A, DO       hydrocortisone (CORTEF) tablet 10 mg  10 mg Oral QHS Nicole Kindred A, DO   10 mg at 08/24/19 2320   hydrocortisone (CORTEF) tablet 20 mg  20 mg Oral Daily Nicole Kindred A, DO   20 mg at 08/25/19 1003   ipratropium-albuterol (DUONEB) 0.5-2.5 (3) MG/3ML nebulizer solution 3 mL  3 mL Nebulization Q6H PRN Nicole Kindred A, DO       levothyroxine (SYNTHROID) tablet 150 mcg  150 mcg Oral QAC breakfast Nicole Kindred A, DO   150 mcg at 08/25/19 0605   ondansetron (ZOFRAN) tablet 4 mg  4 mg Oral Q6H PRN Nicole Kindred A, DO       Or   ondansetron (ZOFRAN) injection 4 mg  4 mg Intravenous Q6H PRN Nicole Kindred A, DO        oxyCODONE (Oxy IR/ROXICODONE) immediate release tablet 5 mg  5 mg Oral Q6H PRN Nicole Kindred A, DO   5 mg at 08/24/19 1633   pantoprazole (PROTONIX) EC tablet 40 mg  40 mg Oral Daily Nicole Kindred A, DO   40 mg at 08/25/19 1003   polyethylene glycol (MIRALAX / GLYCOLAX) packet 17 g  17 g Oral Daily PRN Nicole Kindred A, DO       pravastatin (PRAVACHOL) tablet 20 mg  20 mg Oral q1800 Nicole Kindred A, DO   20 mg  at 08/24/19 1800   vancomycin (VANCOREADY) IVPB 500 mg/100 mL  500 mg Intravenous Q12H Lynelle Doctor, RPH 100 mL/hr at 08/25/19 0017 500 mg at 08/25/19 0017   zolpidem (AMBIEN) tablet 5 mg  5 mg Oral QHS PRN Nicole Kindred A, DO   5 mg at 08/24/19 2301     No Known Allergies:  Family History  Problem Relation Age of Onset   Heart disease Mother    Cancer Brother    Heart disease Brother   :  Social History   Socioeconomic History   Marital status: Widowed    Spouse name: Not on file   Number of children: Not on file   Years of education: Not on file   Highest education level: Not on file  Occupational History   Not on file  Tobacco Use   Smoking status: Former Smoker    Packs/day: 0.50    Years: 10.00    Pack years: 5.00    Types: Cigarettes    Quit date: 08/03/1980    Years since quitting: 39.0   Smokeless tobacco: Never Used  Vaping Use   Vaping Use: Never used  Substance and Sexual Activity   Alcohol use: Yes    Comment: wine occ   Drug use: No   Sexual activity: Yes  Other Topics Concern   Not on file  Social History Narrative   Not on file   Social Determinants of Health   Financial Resource Strain:    Difficulty of Paying Living Expenses:   Food Insecurity:    Worried About Charity fundraiser in the Last Year:    Arboriculturist in the Last Year:   Transportation Needs:    Film/video editor (Medical):    Lack of Transportation (Non-Medical):   Physical Activity:    Days of Exercise per Week:    Minutes of  Exercise per Session:   Stress:    Feeling of Stress :   Social Connections:    Frequency of Communication with Friends and Family:    Frequency of Social Gatherings with Friends and Family:    Attends Religious Services:    Active Member of Clubs or Organizations:    Attends Music therapist:    Marital Status:   Intimate Partner Violence:    Fear of Current or Ex-Partner:    Emotionally Abused:    Physically Abused:    Sexually Abused:   :  Review of Systems: A comprehensive 14 point review of systems was negative except as noted in the HPI.  Exam: Patient Vitals for the past 24 hrs:  BP Temp Temp src Pulse Resp SpO2  08/25/19 0610 (!) 156/78 -- -- 69 20 97 %  08/25/19 0128 119/68 97.8 F (36.6 C) -- 70 20 97 %  08/24/19 2155 120/64 97.8 F (36.6 C) -- 73 20 95 %  08/24/19 1756 (!) 106/48 98.7 F (37.1 C) Oral 76 18 97 %  08/24/19 1301 (!) 153/85 -- -- 80 16 94 %  08/24/19 1216 (!) 115/92 -- -- 65 (!) 25 92 %  08/24/19 1113 (!) 118/99 -- -- 65 (!) 24 99 %    General: Chronically ill-appearing male, no distress Eyes:  no scleral icterus.   ENT:  There were no oropharyngeal lesions.   Neck was without thyromegaly.   Lymphatics:  Negative cervical, supraclavicular or axillary adenopathy.   Respiratory: lungs were clear bilaterally without wheezing or crackles.   Cardiovascular:  Regular  rate and rhythm, S1/S2, without murmur, rub or gallop.  There was no pedal edema.   GI:  abdomen was soft, flat, nontender, nondistended, without organomegaly.   Musculoskeletal:  no spinal tenderness of palpation of vertebral spine.   Skin exam was without echymosis, petichae.   Neuro exam was nonfocal. Patient was alert and oriented.  Attention was good.   Language was appropriate.  Mood was normal without depression.  Speech was not pressured.  Thought content was not tangential.     Lab Results  Component Value Date   WBC 6.2 08/25/2019   HGB 11.2 (L)  08/25/2019   HCT 35.0 (L) 08/25/2019   PLT 412 (H) 08/25/2019   GLUCOSE 167 (H) 08/25/2019   TRIG 63 08/24/2006   ALT 12 08/24/2019   AST 19 08/24/2019   NA 134 (L) 08/25/2019   K 4.4 08/25/2019   CL 102 08/25/2019   CREATININE 0.51 (L) 08/25/2019   BUN 17 08/25/2019   CO2 25 08/25/2019    DG Chest 2 View  Result Date: 08/24/2019 CLINICAL DATA:  Fever.  Probable lung cancer. EXAM: CHEST - 2 VIEW COMPARISON:  CT of the chest, abdomen, and pelvis 08/20/2019. FINDINGS: Heart size normal. Atherosclerotic changes are present at the aortic arch. Right upper lobe spiculated mass in perihilar opacities are again noted. No new airspace disease is present. IMPRESSION: 1. Stable right upper lobe mass and perihilar opacities. 2. No new airspace disease. 3. Atherosclerosis. Electronically Signed   By: San Morelle M.D.   On: 08/24/2019 07:03   DG Chest 2 View  Result Date: 08/20/2019 CLINICAL DATA:  Cough, left rib pain for 2 weeks EXAM: CHEST - 2 VIEW COMPARISON:  10/07/2018 FINDINGS: Frontal and lateral views of the chest demonstrate a stable cardiac silhouette. Persistent areas of scarring are seen within the superior segment right lower lobe. No new airspace disease, effusion, or pneumothorax. Significant background emphysema. Severe right shoulder osteoarthritis. IMPRESSION: 1. Stable scarring within the right lower lobe. 2. Emphysema. 3. No acute process. Electronically Signed   By: Randa Ngo M.D.   On: 08/20/2019 16:32   DG Ribs Unilateral Left  Result Date: 08/20/2019 CLINICAL DATA:  Cough, left rib pain for 2 weeks EXAM: LEFT RIBS - 2 VIEW COMPARISON:  10/07/2018 FINDINGS: Frontal and oblique views of the left thoracic cage are obtained. There are no acute displaced rib fractures. Visualized portions of the left lung are clear. Chronic scarring within the right lower lobe again noted. IMPRESSION: 1. No acute displaced fracture. Electronically Signed   By: Randa Ngo M.D.   On:  08/20/2019 16:34   CT Head Wo Contrast  Result Date: 08/20/2019 CLINICAL DATA:  Slurred speech and left facial droop.  Lethargy. EXAM: CT HEAD WITHOUT CONTRAST TECHNIQUE: Contiguous axial images were obtained from the base of the skull through the vertex without intravenous contrast. COMPARISON:  None. FINDINGS: Brain: Multiple brain masses disseminated throughout the cerebellum and both cerebral hemispheres consistent with advanced intracranial metastatic disease. Most of these are associated with petechial blood products. No frank hematoma. Many of the lesions are associated with vasogenic edema. There is no midline shift. No hydrocephalus or extra-axial collection. Index left cerebellar lesion image 8 measures 14 mm. Index right temporal lesion image 13 measures 28 mm. Index left occipital lesion image 16 measures 23 mm. Index left frontal lesion image 16 measures 28 mm. Index right frontoparietal lesion image 20 for measures 17 mm. Vascular: There is atherosclerotic calcification of the major vessels  at the base of the brain. Skull: Previous trans-sphenoidal surgery. No lytic calvarial lesion. Sinuses/Orbits: Clear/normal Other: None IMPRESSION: Advanced widespread metastatic disease throughout the brain. In total, there are probably 40-50 lesions. Most are associated with petechial blood products. Many are associated with vasogenic edema. No midline shift. No hydrocephalus. Lung cancer is the most common primary tumor to present with this pattern. Distant trans-sphenoidal pituitary surgery. Electronically Signed   By: Nelson Chimes M.D.   On: 08/20/2019 15:48   CT Chest W Contrast  Result Date: 08/20/2019 CLINICAL DATA:  Altered level of consciousness, diffuse intracranial metastases with unknown primary malignancy EXAM: CT CHEST, ABDOMEN, AND PELVIS WITH CONTRAST TECHNIQUE: Multidetector CT imaging of the chest, abdomen and pelvis was performed following the standard protocol during bolus administration  of intravenous contrast. CONTRAST:  155mL OMNIPAQUE IOHEXOL 300 MG/ML  SOLN COMPARISON:  09/22/2018 FINDINGS: CT CHEST FINDINGS Cardiovascular: Heart and great vessels are unremarkable without pericardial effusion. Moderate atherosclerosis of the aorta and coronary vessels. Mediastinum/Nodes: No enlarged mediastinal, hilar, or axillary lymph nodes. Thyroid gland, trachea, and esophagus demonstrate no significant findings. Lungs/Pleura: Extensive background emphysema again noted. The cavitating pneumonia within the right lower lobe on prior study has resolved, with resulting scarring within the superior segment right lower lobe. There is a new rounded somewhat spiculated mass within the periphery of the right upper lobe, measuring 2.6 x 3.8 cm on image 26 of series 2. No effusion or pneumothorax. Central airways are patent. Musculoskeletal: There are no acute or destructive bony lesions. Reconstructed images demonstrate no additional findings. CT ABDOMEN PELVIS FINDINGS Hepatobiliary: There is decreased attenuation throughout the majority of the liver parenchyma consistent with hepatic steatosis. Higher attenuation within segment 7 likely reflects fatty sparing. No mass effect. The gallbladder is surgically absent. Pancreas: Unremarkable. No pancreatic ductal dilatation or surrounding inflammatory changes. Spleen: Normal in size without focal abnormality. Adrenals/Urinary Tract: 5 mm nonobstructing right renal calculus. There are numerous bilateral renal cortical cysts. The adrenals are unremarkable. Bladder is decompressed, which limits evaluation. Stomach/Bowel: No bowel obstruction or ileus. Diffuse diverticulosis of the distal colon without diverticulitis. No bowel wall thickening or inflammatory change. Vascular/Lymphatic: Extensive atherosclerosis of the abdominal aorta. There is aneurysmal dilatation of the right common iliac artery measuring up to 2.8 cm. No pathologic adenopathy. Reproductive: Prostate is  unremarkable. Other: No free fluid or free gas. No abdominal wall hernia. Musculoskeletal: No acute or destructive bony lesions. Reconstructed images demonstrate no additional findings. IMPRESSION: 1. New rounded somewhat spiculated mass within the periphery of the right upper lobe, measuring 2.6 x 3.8 cm, concerning for malignancy. PET-CT may be useful for further evaluation. 2. Resolution of the previously seen cavitating pneumonia within the right lower lobe, with resulting scarring. 3. Hepatic steatosis, with fatty sparing in the posterior right lobe. 4. A 5 mm nonobstructing right renal calculus. 5. Aneurysmal dilatation of the right common iliac artery measuring up to 2.8 cm. 6. Aortic Atherosclerosis (ICD10-I70.0) and Emphysema (ICD10-J43.9). Electronically Signed   By: Randa Ngo M.D.   On: 08/20/2019 23:28   MR ABDOMEN W WO CONTRAST  Result Date: 08/25/2019 CLINICAL DATA:  86 year old male with history of abdominal mass. Suspected neoplasm. EXAM: MRI ABDOMEN WITHOUT AND WITH CONTRAST TECHNIQUE: Multiplanar multisequence MR imaging of the abdomen was performed both before and after the administration of intravenous contrast. CONTRAST:  90mL GADAVIST GADOBUTROL 1 MMOL/ML IV SOLN COMPARISON:  No prior abdominal MRI. CT the abdomen and pelvis 08/20/2019. FINDINGS: Lower chest: Unremarkable. Hepatobiliary: In the inferior  aspect of segment 5 of the liver (axial image 56 of series 24) there is a 1.4 x 1.4 cm T1 hypointense, T2 iso to slightly hyperintense lesion which demonstrates some low-level internal enhancement, concerning for neoplasm. In segment 3 of the liver (axial image 43 of series 24) there is a subtle T1 hypointense, T2 isointense lesion which does not demonstrate definitive internal enhancement on post gadolinium images (although assessment is limited by the small size of the lesion and motion artifact which affects subtraction imaging). Status post cholecystectomy. No intrahepatic biliary  ductal dilatation. Common bile duct measures 6 mm in the porta hepatis. However, the common hepatic duct is mildly dilated measuring up to 14 mm in diameter, and there multiple filling defects in the common hepatic duct, compatible with choledocholithiasis, largest of which measures up to 11 mm (axial image 19 of series 25). Pancreas: No pancreatic mass. No pancreatic ductal dilatation. No pancreatic or peripancreatic fluid collections or inflammatory changes. Spleen:  Unremarkable. Adrenals/Urinary Tract: There are multiple T1 hypointense, T2 hyperintense, nonenhancing lesions associated with both kidneys, largest of which is exophytic extending off the lower pole of the right kidney measuring up to 4.8 x 4.1 cm in diameter, compatible with simple cysts. No suspicious renal lesions are noted. No hydroureteronephrosis in the visualized portions of the abdomen. Bilateral adrenal glands are normal in appearance. Stomach/Bowel: Visualized portions are unremarkable. Vascular/Lymphatic: Aortic atherosclerosis, with aneurysmal dilatation of the right common iliac artery which measures up to 2.6 cm in diameter. No lymphadenopathy noted in the abdomen. Other: No significant volume of ascites noted in the visualized portions of the peritoneal cavity. Musculoskeletal: No aggressive appearing osseous lesions are noted in the visualized portions of the skeleton. IMPRESSION: 1. There are 2 liver lesions. One of these, in the inferior aspect of segment 5 of the liver, has imaging characteristics concerning for malignancy, potentially a metastatic lesion. Other lesion in segment 3 is too small to definitively characterize, but does not appear to be a simple cyst or other clearly benign lesion. 2. Status post cholecystectomy with choledocholithiasis in the common hepatic duct. Although the common hepatic duct is mildly dilated, there is no other intrahepatic biliary ductal dilatation. Common bile duct is normal in caliber measuring  6 mm in the porta hepatis. 3. Aortic atherosclerosis, as well as aneurysmal dilatation of the right common iliac artery which measures up to 2.6 cm in diameter. e Electronically Signed   By: Vinnie Langton M.D.   On: 08/25/2019 08:26   CT Abdomen Pelvis W Contrast  Result Date: 08/20/2019 CLINICAL DATA:  Altered level of consciousness, diffuse intracranial metastases with unknown primary malignancy EXAM: CT CHEST, ABDOMEN, AND PELVIS WITH CONTRAST TECHNIQUE: Multidetector CT imaging of the chest, abdomen and pelvis was performed following the standard protocol during bolus administration of intravenous contrast. CONTRAST:  179mL OMNIPAQUE IOHEXOL 300 MG/ML  SOLN COMPARISON:  09/22/2018 FINDINGS: CT CHEST FINDINGS Cardiovascular: Heart and great vessels are unremarkable without pericardial effusion. Moderate atherosclerosis of the aorta and coronary vessels. Mediastinum/Nodes: No enlarged mediastinal, hilar, or axillary lymph nodes. Thyroid gland, trachea, and esophagus demonstrate no significant findings. Lungs/Pleura: Extensive background emphysema again noted. The cavitating pneumonia within the right lower lobe on prior study has resolved, with resulting scarring within the superior segment right lower lobe. There is a new rounded somewhat spiculated mass within the periphery of the right upper lobe, measuring 2.6 x 3.8 cm on image 26 of series 2. No effusion or pneumothorax. Central airways are patent. Musculoskeletal: There  are no acute or destructive bony lesions. Reconstructed images demonstrate no additional findings. CT ABDOMEN PELVIS FINDINGS Hepatobiliary: There is decreased attenuation throughout the majority of the liver parenchyma consistent with hepatic steatosis. Higher attenuation within segment 7 likely reflects fatty sparing. No mass effect. The gallbladder is surgically absent. Pancreas: Unremarkable. No pancreatic ductal dilatation or surrounding inflammatory changes. Spleen: Normal in  size without focal abnormality. Adrenals/Urinary Tract: 5 mm nonobstructing right renal calculus. There are numerous bilateral renal cortical cysts. The adrenals are unremarkable. Bladder is decompressed, which limits evaluation. Stomach/Bowel: No bowel obstruction or ileus. Diffuse diverticulosis of the distal colon without diverticulitis. No bowel wall thickening or inflammatory change. Vascular/Lymphatic: Extensive atherosclerosis of the abdominal aorta. There is aneurysmal dilatation of the right common iliac artery measuring up to 2.8 cm. No pathologic adenopathy. Reproductive: Prostate is unremarkable. Other: No free fluid or free gas. No abdominal wall hernia. Musculoskeletal: No acute or destructive bony lesions. Reconstructed images demonstrate no additional findings. IMPRESSION: 1. New rounded somewhat spiculated mass within the periphery of the right upper lobe, measuring 2.6 x 3.8 cm, concerning for malignancy. PET-CT may be useful for further evaluation. 2. Resolution of the previously seen cavitating pneumonia within the right lower lobe, with resulting scarring. 3. Hepatic steatosis, with fatty sparing in the posterior right lobe. 4. A 5 mm nonobstructing right renal calculus. 5. Aneurysmal dilatation of the right common iliac artery measuring up to 2.8 cm. 6. Aortic Atherosclerosis (ICD10-I70.0) and Emphysema (ICD10-J43.9). Electronically Signed   By: Randa Ngo M.D.   On: 08/20/2019 23:28     DG Chest 2 View  Result Date: 08/24/2019 CLINICAL DATA:  Fever.  Probable lung cancer. EXAM: CHEST - 2 VIEW COMPARISON:  CT of the chest, abdomen, and pelvis 08/20/2019. FINDINGS: Heart size normal. Atherosclerotic changes are present at the aortic arch. Right upper lobe spiculated mass in perihilar opacities are again noted. No new airspace disease is present. IMPRESSION: 1. Stable right upper lobe mass and perihilar opacities. 2. No new airspace disease. 3. Atherosclerosis. Electronically Signed    By: San Morelle M.D.   On: 08/24/2019 07:03   DG Chest 2 View  Result Date: 08/20/2019 CLINICAL DATA:  Cough, left rib pain for 2 weeks EXAM: CHEST - 2 VIEW COMPARISON:  10/07/2018 FINDINGS: Frontal and lateral views of the chest demonstrate a stable cardiac silhouette. Persistent areas of scarring are seen within the superior segment right lower lobe. No new airspace disease, effusion, or pneumothorax. Significant background emphysema. Severe right shoulder osteoarthritis. IMPRESSION: 1. Stable scarring within the right lower lobe. 2. Emphysema. 3. No acute process. Electronically Signed   By: Randa Ngo M.D.   On: 08/20/2019 16:32   DG Ribs Unilateral Left  Result Date: 08/20/2019 CLINICAL DATA:  Cough, left rib pain for 2 weeks EXAM: LEFT RIBS - 2 VIEW COMPARISON:  10/07/2018 FINDINGS: Frontal and oblique views of the left thoracic cage are obtained. There are no acute displaced rib fractures. Visualized portions of the left lung are clear. Chronic scarring within the right lower lobe again noted. IMPRESSION: 1. No acute displaced fracture. Electronically Signed   By: Randa Ngo M.D.   On: 08/20/2019 16:34   CT Head Wo Contrast  Result Date: 08/20/2019 CLINICAL DATA:  Slurred speech and left facial droop.  Lethargy. EXAM: CT HEAD WITHOUT CONTRAST TECHNIQUE: Contiguous axial images were obtained from the base of the skull through the vertex without intravenous contrast. COMPARISON:  None. FINDINGS: Brain: Multiple brain masses disseminated throughout the  cerebellum and both cerebral hemispheres consistent with advanced intracranial metastatic disease. Most of these are associated with petechial blood products. No frank hematoma. Many of the lesions are associated with vasogenic edema. There is no midline shift. No hydrocephalus or extra-axial collection. Index left cerebellar lesion image 8 measures 14 mm. Index right temporal lesion image 13 measures 28 mm. Index left occipital lesion  image 16 measures 23 mm. Index left frontal lesion image 16 measures 28 mm. Index right frontoparietal lesion image 20 for measures 17 mm. Vascular: There is atherosclerotic calcification of the major vessels at the base of the brain. Skull: Previous trans-sphenoidal surgery. No lytic calvarial lesion. Sinuses/Orbits: Clear/normal Other: None IMPRESSION: Advanced widespread metastatic disease throughout the brain. In total, there are probably 40-50 lesions. Most are associated with petechial blood products. Many are associated with vasogenic edema. No midline shift. No hydrocephalus. Lung cancer is the most common primary tumor to present with this pattern. Distant trans-sphenoidal pituitary surgery. Electronically Signed   By: Nelson Chimes M.D.   On: 08/20/2019 15:48   CT Chest W Contrast  Result Date: 08/20/2019 CLINICAL DATA:  Altered level of consciousness, diffuse intracranial metastases with unknown primary malignancy EXAM: CT CHEST, ABDOMEN, AND PELVIS WITH CONTRAST TECHNIQUE: Multidetector CT imaging of the chest, abdomen and pelvis was performed following the standard protocol during bolus administration of intravenous contrast. CONTRAST:  179mL OMNIPAQUE IOHEXOL 300 MG/ML  SOLN COMPARISON:  09/22/2018 FINDINGS: CT CHEST FINDINGS Cardiovascular: Heart and great vessels are unremarkable without pericardial effusion. Moderate atherosclerosis of the aorta and coronary vessels. Mediastinum/Nodes: No enlarged mediastinal, hilar, or axillary lymph nodes. Thyroid gland, trachea, and esophagus demonstrate no significant findings. Lungs/Pleura: Extensive background emphysema again noted. The cavitating pneumonia within the right lower lobe on prior study has resolved, with resulting scarring within the superior segment right lower lobe. There is a new rounded somewhat spiculated mass within the periphery of the right upper lobe, measuring 2.6 x 3.8 cm on image 26 of series 2. No effusion or pneumothorax. Central  airways are patent. Musculoskeletal: There are no acute or destructive bony lesions. Reconstructed images demonstrate no additional findings. CT ABDOMEN PELVIS FINDINGS Hepatobiliary: There is decreased attenuation throughout the majority of the liver parenchyma consistent with hepatic steatosis. Higher attenuation within segment 7 likely reflects fatty sparing. No mass effect. The gallbladder is surgically absent. Pancreas: Unremarkable. No pancreatic ductal dilatation or surrounding inflammatory changes. Spleen: Normal in size without focal abnormality. Adrenals/Urinary Tract: 5 mm nonobstructing right renal calculus. There are numerous bilateral renal cortical cysts. The adrenals are unremarkable. Bladder is decompressed, which limits evaluation. Stomach/Bowel: No bowel obstruction or ileus. Diffuse diverticulosis of the distal colon without diverticulitis. No bowel wall thickening or inflammatory change. Vascular/Lymphatic: Extensive atherosclerosis of the abdominal aorta. There is aneurysmal dilatation of the right common iliac artery measuring up to 2.8 cm. No pathologic adenopathy. Reproductive: Prostate is unremarkable. Other: No free fluid or free gas. No abdominal wall hernia. Musculoskeletal: No acute or destructive bony lesions. Reconstructed images demonstrate no additional findings. IMPRESSION: 1. New rounded somewhat spiculated mass within the periphery of the right upper lobe, measuring 2.6 x 3.8 cm, concerning for malignancy. PET-CT may be useful for further evaluation. 2. Resolution of the previously seen cavitating pneumonia within the right lower lobe, with resulting scarring. 3. Hepatic steatosis, with fatty sparing in the posterior right lobe. 4. A 5 mm nonobstructing right renal calculus. 5. Aneurysmal dilatation of the right common iliac artery measuring up to 2.8 cm. 6. Aortic  Atherosclerosis (ICD10-I70.0) and Emphysema (ICD10-J43.9). Electronically Signed   By: Randa Ngo M.D.   On:  08/20/2019 23:28   MR ABDOMEN W WO CONTRAST  Result Date: 08/25/2019 CLINICAL DATA:  86 year old male with history of abdominal mass. Suspected neoplasm. EXAM: MRI ABDOMEN WITHOUT AND WITH CONTRAST TECHNIQUE: Multiplanar multisequence MR imaging of the abdomen was performed both before and after the administration of intravenous contrast. CONTRAST:  29mL GADAVIST GADOBUTROL 1 MMOL/ML IV SOLN COMPARISON:  No prior abdominal MRI. CT the abdomen and pelvis 08/20/2019. FINDINGS: Lower chest: Unremarkable. Hepatobiliary: In the inferior aspect of segment 5 of the liver (axial image 56 of series 24) there is a 1.4 x 1.4 cm T1 hypointense, T2 iso to slightly hyperintense lesion which demonstrates some low-level internal enhancement, concerning for neoplasm. In segment 3 of the liver (axial image 43 of series 24) there is a subtle T1 hypointense, T2 isointense lesion which does not demonstrate definitive internal enhancement on post gadolinium images (although assessment is limited by the small size of the lesion and motion artifact which affects subtraction imaging). Status post cholecystectomy. No intrahepatic biliary ductal dilatation. Common bile duct measures 6 mm in the porta hepatis. However, the common hepatic duct is mildly dilated measuring up to 14 mm in diameter, and there multiple filling defects in the common hepatic duct, compatible with choledocholithiasis, largest of which measures up to 11 mm (axial image 19 of series 25). Pancreas: No pancreatic mass. No pancreatic ductal dilatation. No pancreatic or peripancreatic fluid collections or inflammatory changes. Spleen:  Unremarkable. Adrenals/Urinary Tract: There are multiple T1 hypointense, T2 hyperintense, nonenhancing lesions associated with both kidneys, largest of which is exophytic extending off the lower pole of the right kidney measuring up to 4.8 x 4.1 cm in diameter, compatible with simple cysts. No suspicious renal lesions are noted. No  hydroureteronephrosis in the visualized portions of the abdomen. Bilateral adrenal glands are normal in appearance. Stomach/Bowel: Visualized portions are unremarkable. Vascular/Lymphatic: Aortic atherosclerosis, with aneurysmal dilatation of the right common iliac artery which measures up to 2.6 cm in diameter. No lymphadenopathy noted in the abdomen. Other: No significant volume of ascites noted in the visualized portions of the peritoneal cavity. Musculoskeletal: No aggressive appearing osseous lesions are noted in the visualized portions of the skeleton. IMPRESSION: 1. There are 2 liver lesions. One of these, in the inferior aspect of segment 5 of the liver, has imaging characteristics concerning for malignancy, potentially a metastatic lesion. Other lesion in segment 3 is too small to definitively characterize, but does not appear to be a simple cyst or other clearly benign lesion. 2. Status post cholecystectomy with choledocholithiasis in the common hepatic duct. Although the common hepatic duct is mildly dilated, there is no other intrahepatic biliary ductal dilatation. Common bile duct is normal in caliber measuring 6 mm in the porta hepatis. 3. Aortic atherosclerosis, as well as aneurysmal dilatation of the right common iliac artery which measures up to 2.6 cm in diameter. e Electronically Signed   By: Vinnie Langton M.D.   On: 08/25/2019 08:26   CT Abdomen Pelvis W Contrast  Result Date: 08/20/2019 CLINICAL DATA:  Altered level of consciousness, diffuse intracranial metastases with unknown primary malignancy EXAM: CT CHEST, ABDOMEN, AND PELVIS WITH CONTRAST TECHNIQUE: Multidetector CT imaging of the chest, abdomen and pelvis was performed following the standard protocol during bolus administration of intravenous contrast. CONTRAST:  125mL OMNIPAQUE IOHEXOL 300 MG/ML  SOLN COMPARISON:  09/22/2018 FINDINGS: CT CHEST FINDINGS Cardiovascular: Heart and great vessels  are unremarkable without pericardial  effusion. Moderate atherosclerosis of the aorta and coronary vessels. Mediastinum/Nodes: No enlarged mediastinal, hilar, or axillary lymph nodes. Thyroid gland, trachea, and esophagus demonstrate no significant findings. Lungs/Pleura: Extensive background emphysema again noted. The cavitating pneumonia within the right lower lobe on prior study has resolved, with resulting scarring within the superior segment right lower lobe. There is a new rounded somewhat spiculated mass within the periphery of the right upper lobe, measuring 2.6 x 3.8 cm on image 26 of series 2. No effusion or pneumothorax. Central airways are patent. Musculoskeletal: There are no acute or destructive bony lesions. Reconstructed images demonstrate no additional findings. CT ABDOMEN PELVIS FINDINGS Hepatobiliary: There is decreased attenuation throughout the majority of the liver parenchyma consistent with hepatic steatosis. Higher attenuation within segment 7 likely reflects fatty sparing. No mass effect. The gallbladder is surgically absent. Pancreas: Unremarkable. No pancreatic ductal dilatation or surrounding inflammatory changes. Spleen: Normal in size without focal abnormality. Adrenals/Urinary Tract: 5 mm nonobstructing right renal calculus. There are numerous bilateral renal cortical cysts. The adrenals are unremarkable. Bladder is decompressed, which limits evaluation. Stomach/Bowel: No bowel obstruction or ileus. Diffuse diverticulosis of the distal colon without diverticulitis. No bowel wall thickening or inflammatory change. Vascular/Lymphatic: Extensive atherosclerosis of the abdominal aorta. There is aneurysmal dilatation of the right common iliac artery measuring up to 2.8 cm. No pathologic adenopathy. Reproductive: Prostate is unremarkable. Other: No free fluid or free gas. No abdominal wall hernia. Musculoskeletal: No acute or destructive bony lesions. Reconstructed images demonstrate no additional findings. IMPRESSION: 1. New  rounded somewhat spiculated mass within the periphery of the right upper lobe, measuring 2.6 x 3.8 cm, concerning for malignancy. PET-CT may be useful for further evaluation. 2. Resolution of the previously seen cavitating pneumonia within the right lower lobe, with resulting scarring. 3. Hepatic steatosis, with fatty sparing in the posterior right lobe. 4. A 5 mm nonobstructing right renal calculus. 5. Aneurysmal dilatation of the right common iliac artery measuring up to 2.8 cm. 6. Aortic Atherosclerosis (ICD10-I70.0) and Emphysema (ICD10-J43.9). Electronically Signed   By: Randa Ngo M.D.   On: 08/20/2019 23:28   Assessment and Plan:  This is a pleasant 86 year old white male with highly suspicious metastatic lung cancer. He presented with widespread brain metastases and a mass in his right upper lobe of the lung.  Additional imaging showed at least one lesion in his liver concerning for metastatic disease.  The patient underwent ultrasound-guided biopsy of the liver lesion earlier today and results are pending.  Discussed the likely diagnosis and prognosis with the patient and his family members.  If this is proven to be metastatic lung cancer, this would be incurable but treatable.  Discussed with the patient and his family members that we need to await the biopsy results to have further discussion regarding treatment options.  The patient already has a PET scan scheduled for early next week.  I have advised the patient and his family to keep this appointment as we will need this information for initial staging.  Recommend for the patient to undergo whole brain radiation and radiation to the lung mass under the care of Dr. Lisbeth Renshaw.  He is scheduled for CT simulation later today.   Thank you for this referral.   Mikey Bussing, DNP, AGPCNP-BC, AOCNP  ADDENDUM: Hematology/Oncology Attending: I had a face-to-face encounter with the patient today.  I recommended his care plan.  This is a very  pleasant 86 years old white male with  history of stage I (T1a, Nx, Mx) renal cell carcinoma diagnosed in November 2009 status post partial resection of the left kidney with a tumor size of 1.5 cm.  The patient has been doing fine with no concerning issues but over the last week he started having increasing fatigue and weakness as well as confusion and fever.  He was admitted in March 2021 for treatment of right lung pneumonia.  Imaging studies including CT of the head showed advanced widespread metastatic disease throughout the brain and total there are probably 40-50 lesions most are associated with petechial blood products.  Many are associated with vasogenic edema.  There was no midline shift and no hydrocephalus.  CT scan of the chest, abdomen and pelvis on 08/20/2019 showed new rounded somewhat spiculated mass within the periphery of the right upper lobe measuring 2.6 x 3.8 cm concerning for malignancy.  There was resolution of the previously seen cavitating pneumonia within the right lower lobe with resulting scarring. There was evidence of hepatic steatosis.  MRI of the abdomen and pelvis on 08/25/2019 showed 2 liver lesions.  1 of these in the inferior aspect of segment 5 of the liver has imaging characteristics concerning for malignancy potentially a metastatic lesion.  Other lesion in segment 3 is too small to definitely characterize but does not appear to be a simple cyst or other clearly benign lesion.  The patient underwent ultrasound-guided core biopsy of the suspicious liver lesion by interventional radiology.  The final pathology is still pending. The patient is currently on Decadron for the vasogenic edema and he is feeling much better and was eating his dinner at the time of the visit. He is feeling much better.  He is scheduled to have a PET scan on 08/31/2019. I had a lengthy discussion with the patient and his 2 daughters who are at the bedside about his current condition and treatment  options. I recommended for the patient to proceed with whole brain irradiation as discussed with Dr. Lisbeth Renshaw. I will arrange for the patient a follow-up appointment with me at the cancer center after discharge for more detailed discussion of his treatment options including palliative care versus palliative systemic therapy depending on the final pathology. The patient and his family are in agreement with the current plan. Thank you so much for taking good care of Mr. Wain.  I will continue to follow up the patient with you and assist in his management on as-needed basis.  Disclaimer: This note was dictated with voice recognition software. Similar sounding words can inadvertently be transcribed and may be missed upon review. Eilleen Kempf, MD

## 2019-08-25 NOTE — Progress Notes (Signed)
PROGRESS NOTE    Ryan Coffey  ZOX:[id number] DOB: 1932-03-27 DOA: 08/24/2019 PCP: Prince Solian, MD   Brief Narrative:  HPI per Dr. Nicole Kindred on 08/24/19 Ryan Coffey is a 86 y.o. male with medical history significant of recently diagnosed right upper lung mass, COPD, panhypopituitarism, rheumatoid arthritis, hyperlipidemia, history of prostate cancer and renal cell carcinoma, thrombocytosis and immunocompromise state.  He presented to the ED early this morning with new onset fevers and generalized weakness.  Patient reported temp at home of 102.1, however EMS reported T-max 103.3.  Seen at bedside in the ED with daughter present who assisted with history.  Onset of fever was around 9 PM, daughter reports she noticed that he felt warm but said he felt okay.  Daughter went home, and patient called her around 1 AM feeling too weak to get up to the bathroom.  He also began having hemoptysis overnight, they estimate teaspoons in volume.  No prior hemoptysis, had a single episode here so far.  He reports associated generalized weakness, poor p.o. intake, unsteady gait but no falls, and recent weight loss of about 30 pounds over the past month or 2.  Daughter also reports that patient was recently noted to have some intermittent slurred speech.  He also states his vision has recently worsening in general, denies peripheral vision deficits, says it is just getting harder to read.  Denies any abdominal pain, nausea, vomiting, diarrhea, dysuria or urinary frequency, skin rashes or wounds.  He does not require home oxygen, did have it in the past following pneumonia.    Regarding his recent lung cancer diagnosis, patient was seen in the ED on 6/17 for slurred speech confusion and weight loss.  Head CT showed widespread brain metastases.  Subsequent imaging revealed the right-sided lung mass.  He has upcoming appointment with [name] on 6/24, and PET scan scheduled for 6/28.  It appears tissue  diagnosis is pending at this time.  Patient lives alone.  At baseline, patient is independent with ambulation but recently has been using his walker due to worsening weakness and feeling unsteady.   ED Course: Initial temp 100.4 after 1 g Tylenol given by EMS, heart rate 98, respirations 13, initial BP 97/58, O2 sat on room air was 88% which improved with 2 L/min nasal cannula oxygen.  CBC was notable for leukocytosis 13.2, platelets 468 and hemoglobin 12.2.  BMP showed mild hyponatremia 133, BUN 27 and glucose 141.  UA was negative.  Chest x-ray showed stable right upper lung and perihilar opacities without evidence of new airspace disease.  He was treated in the ED with Rocephin and azithromycin for presumed pneumonia given his clinical presentation in addition to IV fluids, cultures were drawn.  Admitted to hospitalist service on telemetry for further evaluation and management.  **Interim History Who is not a candidate for an EBUS or CT-guided biopsy given high risk but they found a liver lesion to biopsy and this is going to be done today.  After his liver biopsy he is going to go for brain stimulation.  Medical oncology, radiation oncology, palliative care, interventional radiology, pulmonology are involved in the case.  Assessment & Plan:   Principal Problem:   Fever Active Problems:   Seropositive rheumatoid arthritis ([place])   Panhypopituitarism (HCC)   Hyperlipidemia   Thrombocytosis (HCC)   COPD (chronic obstructive pulmonary disease) (HCC)   Unintentional weight loss   Immunocompromised state ([place])   Hemoptysis   Metastatic lung carcinoma, right ([place])  Acute respiratory failure with hypoxia (HCC)  SIRS in the setting of his lung mass -POA with fever, leukocytosis, hypoxia.  Normal lactic acid x2.   -No evidence of endorgan damage.   -Was treated for sepsis with IV fluids and antibiotics in the ED.   -Working diagnosis is pneumonia however not clear there is true infection.     -Patient does not appear septic.   -Likely was in the setting of his lung cancer  Fever  -POA, with Tmax en route with EMS 103.3 F.   -Chest x-ray showed stable right upper lobe and perihilar opacities without new airspace disease seen. -Urinalysis was negative.  Blood cultures are pending.   -No GI, GU or dermatologic signs or symptoms.   -It is possible and likely that brain mets, if affecting thermoregulatory center, could be etiology, although less likely than infection.   -Was treating as presumed pneumonia with broad-spectrum antibiotics given his immunocompromise state but will not stop.   -Vancomycin and cefepime initiated but will now stop given his MRSA PCR negative and that he does not have any true signs of infection currently.  WBC has normalized and now 6.2 -Follow cultures -DuoNebs as needed -Antitussives -C/w Incentive spirometer -Procalcitonin is less than 0.10 -Continue to monitor for signs and symptoms of infection however did not clinically feel that he has a pneumonia and have de-escalate and stop antibiotics -Repeat CBC and monitor temperature curve closely  Acute Respiratory Failure with Hypoxia  -Present on admission due to ?PNAand lung mass, requiring 2 L/min nasal cannula oxygen.   -Patient does have COPD but not on home oxygen at baseline.   -Previously required it while recovering from pneumonia. -Continuous pulse oximetry and maintain O2 saturation greater than 90% -Continue supplemental oxygen via nasal cannula and wean O2 as tolerated -SpO2: 95 % O2 Flow Rate (L/min): 2 L/min -He will need an ambulatory home O2 screen prior to discharge -Repeat chest x-ray in a.m.  Hemoptysis  -POA, new onset just prior to presentation.   -Due to lung mass.   -Monitor closely.   -CBCs daily.   -SCDs for VTE prophylaxis. -Will undergo Radiation to the chest.   Metastatic Lung cancer, right-sided, with brain metastases and possible lung metastasis  -Recent  diagnosis.   -CT head showed widespread brain mets estimated 40-50 lesions.   -Has appointment coming up with [name] on 6/24.   -PET scan is scheduled for 6/28.   -Pulmonology consulted to consider EBUS for tissue diagnosis -Oncology, Radiation Oncology and Palliative Care consulted; medical oncology [name] to see the patient as an inpatient  -Pulmonary was consulted and they feel the findings are highly suggestive of metastatic stage IV lung cancer and navigational bronchoscope biopsy or CT-guided biopsy to make diagnosis but felt that allergies would be risky -They recommending a PET scan before the biopsy to ensure maximum yield -Since he had a lesion in his liver will go after this first.  Will still need a PET scan; he had a biopsy of his liver this afternoon and will need to follow-up on the surgical pathology -Decadron 4 mg BID for vasogenic edema in addition to his hydrocortisone for his panhypopituitarism -We will pursue a liver biopsy given his MRI; MRI of the abdomen showed "There are 2 liver lesions. One of these, in the inferior aspect of segment 5 of the liver, has imaging characteristics concerning for malignancy, potentially a metastatic lesion. Other lesion in segment 3 is too small to definitively characterize, but  does not appear to be a simple cyst or other clearly benign lesion.  Status post cholecystectomy with choledocholithiasis in the common hepatic duct. Although the common hepatic duct is mildly dilated, there is no other intrahepatic biliary ductal dilatation. Common bile duct is normal in caliber measuring 6 mm in the porta hepatis.  Aortic atherosclerosis, as well as aneurysmal dilatation of the right common iliac artery which measures up to 2.6 cm in diameter." -Radiation Oncology to start [place] today and he will get Radiotherapy to the Chest and Brain both over 10 Fractions with Palliative intent.   COPD  -Not in acute exacerbation on admission.     -Expiratory wheezes are limited to the right side and secondary to his lung mass.  - Left side is clear with good aeration.   -Appears he is not on inhalers at home.   -C/w DuoNebs 3 mL Neb q6hprn Wheezing   Thrombocytosis  -Chronic and slightly improved -Patient's Platelet Count went from 460 -> 412 -Continue to Monitor and Trend -Repeat CBC in the AM   Normocytic Anemia -Patient's hemoglobin/hematocrit went from 12.2/38.8 and is now trended down to 11.2/35.0  -Check anemia panel in the a.m. -Continue to monitor for signs and symptoms of bleeding; currently no overt bleeding -Repeat CBC in a.m.  Rheumatoid Arthritis  -No acutely flared.   -Patient on methotrexate, next dose Saturday (6/26) but will hold for now and will need to discuss with Rheumatology when to resume.   -Continue folic acid.  Panhypopituitarism  -No acute issues.   -Next testosterone injection due Wednesday (6/23).   -Continue Hydrocortisone 10 mg po Daily with Supper and 20 mg po Dail, Levothyroxine 150 mcg po Daily and Bromocriptine 2.5 mg po BID  Hyperlipidemia  -Continue Pravastatin 20 mg po Daily  Unintentional Weight Loss  -Secondary to malignancy and poor p.o. intake.  Dietitian was consulted.  Immunocompromised State  -Due to malignancy and rheumatoid arthritis.   -Broad-spectrum antibiotics as above now stopped   Insomnia  -Continue home Ambien 5 mg po qHS  GOC: DNR, poA  DVT prophylaxis: SCDs, Enoxaparin 40 mg sq q24h Code Status: DO NOT RESUSCITATE  Family Communication: Discussed with Daughter at bedside  Disposition Plan: Pending further clinical work-up and clearance by the specialists. Will get PT/OT to evaluate and treat when he is no longer on Bedrest from his Liver Bx  Status is: Inpatient  Remains inpatient appropriate because:Ongoing diagnostic testing needed not appropriate for outpatient work up, Unsafe d/c plan, IV treatments appropriate due to intensity of illness or  inability to take PO and Inpatient level of care appropriate due to severity of illness   Dispo: The patient is from: Home              Anticipated d/c is to: TBD              Anticipated d/c date is: 2 days              Patient currently is not medically stable to d/c.  Consultants:   Medical Oncology  Radiation Oncology  Pulmonary Critical Care   Interventional Radiology    Procedures:  Liver Bx; Radiation Simulation, MRI Abdomen, CT Scan  Antimicrobials:  Anti-infectives (From admission, onward)   Start     Dose/Rate Route Frequency Ordered Stop   08/24/19 2200  vancomycin (VANCOREADY) IVPB 500 mg/100 mL     Discontinue     500 mg 100 mL/hr over 60 Minutes Intravenous Every 12 hours  08/24/19 0926     08/24/19 1000  ceFEPIme (MAXIPIME) 2 g in sodium chloride 0.9 % 100 mL IVPB     Discontinue     2 g 200 mL/hr over 30 Minutes Intravenous Every 12 hours 08/24/19 0926     08/24/19 0930  vancomycin (VANCOCIN) IVPB 1000 mg/200 mL premix        1,000 mg 200 mL/hr over 60 Minutes Intravenous  Once 08/24/19 0926 08/24/19 1243   08/24/19 0545  cefTRIAXone (ROCEPHIN) 2 g in sodium chloride 0.9 % 100 mL IVPB  Status:  Discontinued        2 g 200 mL/hr over 30 Minutes Intravenous Every 24 hours 08/24/19 0541 08/24/19 0900   08/24/19 0545  azithromycin (ZITHROMAX) 500 mg in sodium chloride 0.9 % 250 mL IVPB  Status:  Discontinued        500 mg 250 mL/hr over 60 Minutes Intravenous Every 24 hours 08/24/19 0541 08/24/19 0900     Subjective: Seen and examined at bedside and he was very flustered about all the information that was coming on him.  He states that he he is doing okay.  No shortness of breath.  Coughing up some hemoptysis.  Continues to feel weak.  No nausea or vomiting.  Still wearing 2 L supplemental oxygen via nasal cannula.  All questions were answered and he understands that he will be going for liver biopsy and he is agreeable.  Objective: Vitals:   08/25/19 1050  08/25/19 1055 08/25/19 1100 08/25/19 1105  BP: (!) 130/56 (!) 132/45 (!) 111/50 (!) 108/45  Pulse: 70 68  60  Resp: 18 16 (!) 21 16  Temp:      TempSrc:      SpO2: 97% 98%  95%  Weight:      Height:        Intake/Output Summary (Last 24 hours) at 08/25/2019 1210 Last data filed at 08/25/2019 1448 Gross per 24 hour  Intake 1348.73 ml  Output 300 ml  Net 1048.73 ml   Filed Weights   08/24/19 0509  Weight: 63.5 kg   Examination: Physical Exam:  Constitutional: WN/WD Caucasian male in NAD and appears a little anxious Eyes: Lids and conjunctivae normal, sclerae anicteric  ENMT: External Ears, Nose appear normal. Grossly normal hearing.  Neck: Appears normal, supple, no cervical masses, normal ROM, no appreciable thyromegaly; no JVD Respiratory: Diminished to auscultation bilaterally worse on the right compared to the left; mild Rhonchi but no wheezing, rales, or crackles. Normal respiratory effort and patient is not tachypenic. No accessory muscle use. Wearing 2 liters of supplemental O2 via Waves Cardiovascular: RRR, no murmurs / rubs / gallops. S1 and S2 auscultated. No carotid bruits.  Abdomen: Soft, non-tender, non-distended. Bowel sounds positive.  GU: Deferred. Musculoskeletal: No clubbing / cyanosis of digits/nails. No joint deformity upper and lower extremities.  Skin: No rashes, lesions, ulcers on a limited skin evaluation. No induration; Warm and dry.  Neurologic: CN 2-12 grossly intact with no focal deficits.  Romberg sign and cerebellar reflexes not assessed.  Psychiatric: Normal judgment and insight. Alert and oriented x 3. Anxious mood and appropriate affect.   Data Reviewed: I have personally reviewed following labs and imaging studies  CBC: Recent Labs  Lab 08/20/19 1600 08/24/19 0522 08/25/19 0317  WBC 14.2* 13.2* 6.2  NEUTROABS 11.7* 10.9*  --   HGB 13.0 12.2* 11.2*  HCT 41.2 38.8* 35.0*  MCV 97.4 97.2 97.2  PLT 474* 460* 185*   Basic Metabolic  Panel: Recent Labs  Lab 08/20/19 1600 08/24/19 0522 08/25/19 0317  NA 135 133* 134*  K 4.7 3.7 4.4  CL 97* 97* 102  CO2 26 23 25   GLUCOSE 106* 141* 167*  BUN 32* 27* 17  CREATININE 1.11 0.96 0.51*  CALCIUM 9.6 8.9 8.5*  MG  --   --  1.8   GFR: Estimated Creatinine Clearance: 61.7 mL/min (A) (by C-G formula based on SCr of 0.51 mg/dL (L)). Liver Function Tests: Recent Labs  Lab 08/20/19 1600 08/24/19 0522  AST 21 19  ALT 12 12  ALKPHOS 64 61  BILITOT 1.0 0.8  PROT 8.0 7.1  ALBUMIN 3.9 3.4*   No results for input(s): LIPASE, AMYLASE in the last 168 hours. No results for input(s): AMMONIA in the last 168 hours. Coagulation Profile: Recent Labs  Lab 08/24/19 0521  INR 1.0   Cardiac Enzymes: No results for input(s): CKTOTAL, CKMB, CKMBINDEX, TROPONINI in the last 168 hours. BNP (last 3 results) Recent Labs    10/07/18 1110  PROBNP 973*   HbA1C: No results for input(s): HGBA1C in the last 72 hours. CBG: No results for input(s): GLUCAP in the last 168 hours. Lipid Profile: No results for input(s): CHOL, HDL, LDLCALC, TRIG, CHOLHDL, LDLDIRECT in the last 72 hours. Thyroid Function Tests: No results for input(s): TSH, T4TOTAL, FREET4, T3FREE, THYROIDAB in the last 72 hours. Anemia Panel: No results for input(s): VITAMINB12, FOLATE, FERRITIN, TIBC, IRON, RETICCTPCT in the last 72 hours. Sepsis Labs: Recent Labs  Lab 08/24/19 0522 08/24/19 0744 08/25/19 0317  PROCALCITON  --   --  <0.10  LATICACIDVEN 1.6 0.7  --     Recent Results (from the past 240 hour(s))  SARS Coronavirus 2 by RT PCR (hospital order, performed in [facility] hospital lab) Nasopharyngeal Nasopharyngeal Swab     Status: None   Collection Time: 08/20/19  5:42 PM   Specimen: Nasopharyngeal Swab  Result Value Ref Range Status   SARS Coronavirus 2 NEGATIVE NEGATIVE Final    Comment: (NOTE) SARS-CoV-2 target nucleic acids are NOT DETECTED.  The SARS-CoV-2 RNA is generally detectable in  upper and lower respiratory specimens during the acute phase of infection. The lowest concentration of SARS-CoV-2 viral copies this assay can detect is 250 copies / mL. A negative result does not preclude SARS-CoV-2 infection and should not be used as the sole basis for treatment or other patient management decisions.  A negative result may occur with improper specimen collection / handling, submission of specimen other than nasopharyngeal swab, presence of viral mutation(s) within the areas targeted by this assay, and inadequate number of viral copies (<250 copies / mL). A negative result must be combined with clinical observations, patient history, and epidemiological information.  Fact Sheet for Patients:   StrictlyIdeas.no  Fact Sheet for Healthcare Providers: BankingDealers.co.za  This test is not yet approved or  cleared by the [place] FDA and has been authorized for detection and/or diagnosis of SARS-CoV-2 by FDA under an Emergency Use Authorization (EUA).  This EUA will remain in effect (meaning this test can be used) for the duration of the COVID-19 declaration under Section 564(b)(1) of the Act, 21 U.S.C. section 360bbb-3(b)(1), unless the authorization is terminated or revoked sooner.  Performed at [facility], [place] [address] [place], [place] [address]   Blood Culture (routine x 2)     Status: None (Preliminary result)   Collection Time: 08/24/19  5:21 AM   Specimen: BLOOD  Result Value Ref  Range Status   Specimen Description   Final    BLOOD BLOOD RIGHT FOREARM Performed at [place] [address], [place], [place] [address]    Special Requests   Final    BOTTLES DRAWN AEROBIC AND ANAEROBIC Blood Culture results may not be optimal due to an excessive volume of blood received in culture bottles Performed at [place] [address]  [place], [place] [address]    Culture   Final    NO GROWTH 1 DAY Performed at [facility], [place] [address] [place], [place] [address]    Report Status PENDING  Incomplete  Blood Culture (routine x 2)     Status: None (Preliminary result)   Collection Time: 08/24/19  5:33 AM   Specimen: BLOOD LEFT HAND  Result Value Ref Range Status   Specimen Description   Final    BLOOD LEFT HAND Performed at [place] [address] [place], [place] [address]    Special Requests   Final    BOTTLES DRAWN AEROBIC AND ANAEROBIC Blood Culture results may not be optimal due to an inadequate volume of blood received in culture bottles Performed at [place] [address] [place], [place] [address]    Culture   Final    NO GROWTH 1 DAY Performed at [facility], [place] [address] [place], [place] [address]    Report Status PENDING  Incomplete  Urine culture     Status: Abnormal (Preliminary result)   Collection Time: 08/24/19  5:33 AM   Specimen: In/Out Cath Urine  Result Value Ref Range Status   Specimen Description   Final    IN/OUT CATH URINE Performed at [place] [address], [place], [place] [address]    Special Requests   Final    NONE Performed at [facility], [place] [address], [place], [place] [address]    Culture (A)  Final    100 COLONIES/mL STAPHYLOCOCCUS EPIDERMIDIS CULTURE REINCUBATED FOR BETTER GROWTH Performed at [facility], [place] [address], [place],  [address]    Report Status PENDING  Incomplete  SARS Coronavirus 2 by RT PCR (hospital order, performed in [facility] hospital lab) Nasopharyngeal Nasopharyngeal Swab     Status: None   Collection Time: 08/24/19  8:32 AM   Specimen: Nasopharyngeal Swab  Result Value Ref Range Status   SARS Coronavirus 2 NEGATIVE NEGATIVE Final    Comment: (NOTE) SARS-CoV-2 target nucleic acids are NOT DETECTED.  The SARS-CoV-2 RNA  is generally detectable in upper and lower respiratory specimens during the acute phase of infection. The lowest concentration of SARS-CoV-2 viral copies this assay can detect is 250 copies / mL. A negative result does not preclude SARS-CoV-2 infection and should not be used as the sole basis for treatment or other patient management decisions.  A negative result may occur with improper specimen collection / handling, submission of specimen other than nasopharyngeal swab, presence of viral mutation(s) within the areas targeted by this assay, and inadequate number of viral copies (<250 copies / mL). A negative result must be combined with clinical observations, patient history, and epidemiological information.  Fact Sheet for Patients:   StrictlyIdeas.no  Fact Sheet for Healthcare Providers: BankingDealers.co.za  This test is not yet approved or  cleared by the [place] FDA and has been authorized for detection and/or diagnosis of SARS-CoV-2 by FDA under an Emergency Use Authorization (  EUA).  This EUA will remain in effect (meaning this test can be used) for the duration of the COVID-19 declaration under Section 564(b)(1) of the Act, 21 U.S.C. section 360bbb-3(b)(1), unless the authorization is terminated or revoked sooner.  Performed at [facility], [place] [address], [place], [place] [address]   MRSA PCR Screening     Status: None   Collection Time: 08/24/19 11:31 PM   Specimen: Nasopharyngeal  Result Value Ref Range Status   MRSA by PCR NEGATIVE NEGATIVE Final    Comment:        The GeneXpert MRSA Assay (FDA approved for NASAL specimens only), is one component of a comprehensive MRSA colonization surveillance program. It is not intended to diagnose MRSA infection nor to guide or monitor treatment for MRSA infections. Performed at [facility], [place] [address], [place], [place]  [address]     RN Pressure Injury Documentation: Pressure Injury 09/18/18 Coccyx Medial Stage I -  Intact skin with non-blanchable redness of a localized area usually over a bony prominence. (Active)  09/18/18 1700  Location: Coccyx  Location Orientation: Medial  Staging: Stage I -  Intact skin with non-blanchable redness of a localized area usually over a bony prominence.  Wound Description (Comments):   Present on Admission: Yes     Estimated body mass index is 21.29 kg/m as calculated from the following:   Height as of this encounter: 5\' 8"  (1.727 m).   Weight as of this encounter: 63.5 kg.  Malnutrition Type:      Malnutrition Characteristics:      Nutrition Interventions:    Radiology Studies: DG Chest 2 View  Result Date: 08/24/2019 CLINICAL DATA:  Fever.  Probable lung cancer. EXAM: CHEST - 2 VIEW COMPARISON:  CT of the chest, abdomen, and pelvis 08/20/2019. FINDINGS: Heart size normal. Atherosclerotic changes are present at the aortic arch. Right upper lobe spiculated mass in perihilar opacities are again noted. No new airspace disease is present. IMPRESSION: 1. Stable right upper lobe mass and perihilar opacities. 2. No new airspace disease. 3. Atherosclerosis. Electronically Signed   By: San Morelle M.D.   On: 08/24/2019 07:03   MR ABDOMEN W WO CONTRAST  Result Date: 08/25/2019 CLINICAL DATA:  86 year old male with history of abdominal mass. Suspected neoplasm. EXAM: MRI ABDOMEN WITHOUT AND WITH CONTRAST TECHNIQUE: Multiplanar multisequence MR imaging of the abdomen was performed both before and after the administration of intravenous contrast. CONTRAST:  40mL GADAVIST GADOBUTROL 1 MMOL/ML IV SOLN COMPARISON:  No prior abdominal MRI. CT the abdomen and pelvis 08/20/2019. FINDINGS: Lower chest: Unremarkable. Hepatobiliary: In the inferior aspect of segment 5 of the liver (axial image 56 of series 24) there is a 1.4 x 1.4 cm T1 hypointense, T2 iso to slightly  hyperintense lesion which demonstrates some low-level internal enhancement, concerning for neoplasm. In segment 3 of the liver (axial image 43 of series 24) there is a subtle T1 hypointense, T2 isointense lesion which does not demonstrate definitive internal enhancement on post gadolinium images (although assessment is limited by the small size of the lesion and motion artifact which affects subtraction imaging). Status post cholecystectomy. No intrahepatic biliary ductal dilatation. Common bile duct measures 6 mm in the porta hepatis. However, the common hepatic duct is mildly dilated measuring up to 14 mm in diameter, and there multiple filling defects in the common hepatic duct, compatible with choledocholithiasis, largest of which measures up to 11 mm (axial image 19 of series 25). Pancreas: No  pancreatic mass. No pancreatic ductal dilatation. No pancreatic or peripancreatic fluid collections or inflammatory changes. Spleen:  Unremarkable. Adrenals/Urinary Tract: There are multiple T1 hypointense, T2 hyperintense, nonenhancing lesions associated with both kidneys, largest of which is exophytic extending off the lower pole of the right kidney measuring up to 4.8 x 4.1 cm in diameter, compatible with simple cysts. No suspicious renal lesions are noted. No hydroureteronephrosis in the visualized portions of the abdomen. Bilateral adrenal glands are normal in appearance. Stomach/Bowel: Visualized portions are unremarkable. Vascular/Lymphatic: Aortic atherosclerosis, with aneurysmal dilatation of the right common iliac artery which measures up to 2.6 cm in diameter. No lymphadenopathy noted in the abdomen. Other: No significant volume of ascites noted in the visualized portions of the peritoneal cavity. Musculoskeletal: No aggressive appearing osseous lesions are noted in the visualized portions of the skeleton. IMPRESSION: 1. There are 2 liver lesions. One of these, in the inferior aspect of segment 5 of the liver,  has imaging characteristics concerning for malignancy, potentially a metastatic lesion. Other lesion in segment 3 is too small to definitively characterize, but does not appear to be a simple cyst or other clearly benign lesion. 2. Status post cholecystectomy with choledocholithiasis in the common hepatic duct. Although the common hepatic duct is mildly dilated, there is no other intrahepatic biliary ductal dilatation. Common bile duct is normal in caliber measuring 6 mm in the porta hepatis. 3. Aortic atherosclerosis, as well as aneurysmal dilatation of the right common iliac artery which measures up to 2.6 cm in diameter. e Electronically Signed   By: Vinnie Langton M.D.   On: 08/25/2019 08:26   IR US Guide Bx Asp/Drain  Result Date: 08/25/2019 INDICATION: Remote history of renal cell carcinoma, now with findings worrisome for metastatic lung cancer. Please perform ultrasound-guided biopsy indeterminate liver lesion for tissue diagnostic purposes. EXAM: ULTRASOUND GUIDED LIVER LESION BIOPSY COMPARISON:  Abdominal MRI-08/24/2019; CT of the chest, abdomen and pelvis-08/20/2019 MEDICATIONS: None ANESTHESIA/SEDATION: Fentanyl 100 mcg IV; Versed 2 mg IV Total Moderate Sedation time:  10 Minutes. The patient's level of consciousness and vital signs were monitored continuously by radiology nursing throughout the procedure under my direct supervision. COMPLICATIONS: None immediate. PROCEDURE: Informed written consent was obtained from the patient after a discussion of the risks, benefits and alternatives to treatment. The patient understands and consents the procedure. A timeout was performed prior to the initiation of the procedure. Ultrasound scanning was performed of the right upper abdominal quadrant demonstrates an approximately 1.6 x 1.6 cm mixed echogenic nodule within the subcapsular caudal aspect of the right lobe of the liver correlating with the lesion seen on abdominal MRI image 56, series 24. The  procedure was planned. The right upper abdominal quadrant was prepped and draped in the usual sterile fashion. The overlying soft tissues were anesthetized with 1% lidocaine with epinephrine. A 17 gauge, 6.8 cm co-axial needle was advanced into a peripheral aspect of the lesion. This was followed by 5 core biopsies with an 18 gauge core device under direct ultrasound guidance. The coaxial needle tract was embolized with a small amount of Gel-Foam slurry and superficial hemostasis was obtained with manual compression. Post procedural scanning was negative for definitive area of hemorrhage or additional complication. A dressing was placed. The patient tolerated the procedure well without immediate post procedural complication. IMPRESSION: Technically successful ultrasound guided core needle biopsy of indeterminate lesion within the caudal aspect of the right lobe of the liver. Electronically Signed   By: Eldridge Abrahams.D.  On: 08/25/2019 12:01   Scheduled Meds: . bromocriptine  2.5 mg Oral BID  . dexamethasone  4 mg Oral Q12H  . [START ON 08/26/2019] enoxaparin (LOVENOX) injection  40 mg Subcutaneous Q24H  . feeding supplement (ENSURE ENLIVE)  237 mL Oral BID BM  . fentaNYL      . folic acid  2 mg Oral Daily  . gelatin adsorbable      . hydrocortisone  10 mg Oral Q supper  . hydrocortisone  20 mg Oral Daily  . levothyroxine  150 mcg Oral QAC breakfast  . lidocaine      . midazolam      . pantoprazole  40 mg Oral Daily  . pravastatin  20 mg Oral q1800   Continuous Infusions: . ceFEPime (MAXIPIME) IV 2 g (08/24/19 2330)  . vancomycin 500 mg (08/25/19 1011)    LOS: 1 day   Kerney Elbe, DO [facility] PAGER is on [place]  If 7PM-7AM, please contact night-coverage [email or web address]

## 2019-08-25 NOTE — Procedures (Signed)
Pre Procedure Dx: Liver lesion  Post Procedural Dx: Same  Technically successful US guided biopsy of indeterminate lesion within the right lobe of the liver.   EBL: None  No immediate complications.   Jay Klyde Banka, MD Pager #: [phone]    

## 2019-08-25 NOTE — Progress Notes (Signed)
Per [name] appt with Dr. Alen Blew cancelled.  [name] will see patient in the hospital.

## 2019-08-26 ENCOUNTER — Ambulatory Visit
Admit: 2019-08-26 | Discharge: 2019-08-26 | Disposition: A | Discharge status: Home / Self Care | Payer: Medicare Other | Attending: Radiation Oncology | Admitting: Radiation Oncology

## 2019-08-26 ENCOUNTER — Other Ambulatory Visit: Payer: Self-pay

## 2019-08-26 ENCOUNTER — Inpatient Hospital Stay (HOSPITAL_COMMUNITY): Payer: Medicare Other

## 2019-08-26 LAB — COMPREHENSIVE METABOLIC PANEL
ALT: 13 U/L (ref 0–44)
AST: 17 U/L (ref 15–41)
Albumin: 2.9 g/dL — ABNORMAL LOW (ref 3.5–5.0)
Alkaline Phosphatase: 60 U/L (ref 38–126)
Anion gap: 5 (ref 5–15)
BUN: 19 mg/dL (ref 8–23)
CO2: 27 mmol/L (ref 22–32)
Calcium: 8.7 mg/dL — ABNORMAL LOW (ref 8.9–10.3)
Chloride: 101 mmol/L (ref 98–111)
Creatinine, Ser: 0.68 mg/dL (ref 0.61–1.24)
GFR calc Af Amer: >60 mL/min (ref >60)
GFR calc non Af Amer: >60 mL/min (ref >60)
Glucose, Bld: 164 mg/dL — ABNORMAL HIGH (ref 70–99)
Potassium: 4.4 mmol/L (ref 3.5–5.1)
Sodium: 133 mmol/L — ABNORMAL LOW (ref 135–145)
Total Bilirubin: 0.6 mg/dL (ref 0.3–1.2)
Total Protein: 6.3 g/dL — ABNORMAL LOW (ref 6.5–8.1)

## 2019-08-26 LAB — CBC WITH DIFFERENTIAL/PLATELET
Abs Immature Granulocytes: 0.09 10*3/uL — ABNORMAL HIGH (ref 0.00–0.07)
Basophils Absolute: 0 10*3/uL (ref 0.0–0.1)
Basophils Relative: 0 %
Eosinophils Absolute: 0 10*3/uL (ref 0.0–0.5)
Eosinophils Relative: 0 %
HCT: 36.1 % — ABNORMAL LOW (ref 39.0–52.0)
Hemoglobin: 11.6 g/dL — ABNORMAL LOW (ref 13.0–17.0)
Immature Granulocytes: 1 %
Lymphocytes Relative: 7 %
Lymphs Abs: 0.9 10*3/uL (ref 0.7–4.0)
MCH: 31 pg (ref 26.0–34.0)
MCHC: 32.1 g/dL (ref 30.0–36.0)
MCV: 96.5 fL (ref 80.0–100.0)
Monocytes Absolute: 0.6 10*3/uL (ref 0.1–1.0)
Monocytes Relative: 4 %
Neutro Abs: 11.5 10*3/uL — ABNORMAL HIGH (ref 1.7–7.7)
Neutrophils Relative %: 88 %
Platelets: 474 10*3/uL — ABNORMAL HIGH (ref 150–400)
RBC: 3.74 MIL/uL — ABNORMAL LOW (ref 4.22–5.81)
RDW: 14.8 % (ref 11.5–15.5)
WBC: 13.1 10*3/uL — ABNORMAL HIGH (ref 4.0–10.5)
nRBC: 0 % (ref 0.0–0.2)

## 2019-08-26 LAB — PROCALCITONIN: Procalcitonin: <0.1 ng/mL

## 2019-08-26 LAB — MAGNESIUM: Magnesium: 1.9 mg/dL (ref 1.7–2.4)

## 2019-08-26 LAB — PHOSPHORUS: Phosphorus: 2.3 mg/dL — ABNORMAL LOW (ref 2.5–4.6)

## 2019-08-26 MED ORDER — POLYETHYLENE GLYCOL 3350 17 G PO PACK
17.0000 g | PACK | Freq: Every day | ORAL | Status: DC | PRN
  Administered 2019-08-26: 17 g via ORAL
  Filled 2019-08-26: qty 1

## 2019-08-26 MED ORDER — SENNOSIDES-DOCUSATE SODIUM 8.6-50 MG PO TABS
2.0000 | ORAL_TABLET | Freq: Two times a day (BID) | ORAL | Status: DC
  Administered 2019-08-26 – 2019-08-27 (×3): 2 via ORAL
  Filled 2019-08-26 (×5): qty 2

## 2019-08-26 NOTE — Progress Notes (Signed)
Palliative:  HPI: 86 y.o. male  with past medical history of recently diagnosed right upper lung mass, COPD, panhypopituitarism, rheumatoid arthritis, hyperlipidemia, history of prostate and renal cell carcinoma admitted on 08/24/2019 with fever, generalized weakness, slurred speech, hemoptysis. Found to have widespread brain mets and liver mets with known right upper lung mass. No signs of clear infection source and symptoms attributed to metastatic process.   I met today with Ryan Coffey and his daughter at bedside. He is feeling fairly well with no current complaints. Tolerated brain simulation well yesterday. Pain controlled and improving. Preliminary biopsy results confirm malignancy and plans to begin radiation today.   Ryan Coffey and his family have been discussing options for him to return home after rehab and if this will be a realistic option. They have multiple known caregivers that they may reach out to see if they can assist him at home after rehab. We did discuss that if he does decide to forego aggressive treatment such as chemotherapy and focus on quality of life and comfort than hospice may also be an option to assist him at home. Currently they are in a stage of information gathering so they know their options and can elect the best option for Ryan Coffey. He is typically a very organized planner and the many unknowns of what the future may hold and look like is very difficult for him so knowing his options is helpful.   Plan for today is to consult PT and get him out of bed and moving. Speak with CSW about SNF rehab and get that process started. Also plans for radiation therapy. I encouraged them to continue reaching out to contacts for potential caregivers/helpers that they may be able to put into place to allow him to return home after rehab (under the assumption he will need 24/7 support but the physical care support needed is unknown and may fluctuate with time).   All  questions/concerns addressed. Emotional support provided.   Exam: Alert, oriented. No distress. Resting comfortably in bed. Small amounts of bright red blood in sputum occasionally. Breathing regular, unlabored. Abd flat.   Plan: - PT consulted.  - CSW following for SNF rehab options.  - Continue with brain radiation per radiation oncology. - Still considering his wishes for further treatment after radiation completed.  - Introduced the option of hospice if no plans for chemotherapy.   67 min  Vinie Sill, NP Palliative Medicine Team Pager [phone] (Please see [email or web address] for schedule) Team Phone [phone]    Greater than 50%  of this time was spent counseling and coordinating care related to the above assessment and plan

## 2019-08-26 NOTE — Progress Notes (Signed)
I called and left a message for the patient's daughter Ivin Booty, but was also able to reach [place] and the patient as well to review the preliminary findings from his liver biopsy were consistent with a malignancy. Further stains are being ordered for further details as to the histology. We can proceed however knowing this information and will start his XRT this afternoon.     Carola Rhine, PAC

## 2019-08-26 NOTE — Progress Notes (Signed)
PROGRESS NOTE    Ryan Coffey  PJA:[id number] DOB: 03-Jan-1933 DOA: 08/24/2019 PCP: Prince Solian, MD   Brief Narrative:  86 year old with history of renal cell cancer in remission, HLD, pituitary tumor, right lower lobe cavitary pneumonia, rheumatoid arthritis presented with generalized weakness and fevers.  Found to have new spiculated mass in the right upper lobe with metastatic brain disease and liver lesions on MRI abdomen.  Recurrent fevers without any source being on Decadron.  Radiation oncology/oncology team following. S/p Liver lesion biopsy on 08/25/19. No longer on Abx, ProCal negative.    Assessment & Plan:   Principal Problem:   Fever Active Problems:   Seropositive rheumatoid arthritis ([place])   Panhypopituitarism (HCC)   Hyperlipidemia   Thrombocytosis (HCC)   COPD (chronic obstructive pulmonary disease) (HCC)   Unintentional weight loss   Immunocompromised state ([place])   Hemoptysis   Metastatic lung carcinoma, right (HCC)   Acute respiratory failure with hypoxia (HCC)   Goals of care, counseling/discussion   Palliative care by specialist   Lung mass  Fever, unknown etiology.  SIRS Right-sided lung mass with brain metastases History of renal cell cancer Culture data remains negative, procalcitonin is negative.  Hold off on antibiotics. Status post biopsy of liver lesion, prelim shows malignancy.  Final path pending Plans for whole brain radiation starting today Seen by oncology as well. Decadron 4 mg every 12 hours. Outpatient PET scan scheduled 6/28. Outpatient appointment with neuro oncology, [name] 6/24  Acute Respiratory Failure with Hypoxia, resolved Supplemental oxygen.  As needed bronchodilators.  Hemoptysis Resolved, supportive care  COPD As needed bronchodilators, supplemental oxygen  Thrombocytosis Stable around 107 DKA.  Normocytic Anemia Stable around 11.6  Rheumatoid Arthritis -Patient on methotrexate, next dose  Saturday (6/26) but will hold for now and will need to discuss with Rheumatology when to resume.  -Continue folic acid.  Panhypopituitarism.  -On home testosterone shots -Continue Hydrocortisone 10 mg po Daily with Supper and 20 mg po Dail, Levothyroxine 150 mcg po Daily and Bromocriptine 2.5 mg po BID  Hyperlipidemia -Continue Pravastatin 20 mg po Daily  Unintentional Weight Loss -Secondary to malignancy and poor p.o. intake. Dietitian was consulted.  Immunocompromised State -Due to malignancy and rheumatoid arthritis. -Broad-spectrum antibiotics as above now stopped   Insomnia -Continue home Ambien 5 mg po qHS  PT/OT  DVT prophylaxis: Lovenox Code Status: DNR Family Communication: Daughter at bedside  Status is: Inpatient  Remains inpatient appropriate because:Inpatient level of care appropriate due to severity of illness   Dispo: The patient is from: Home              Anticipated d/c is to: To be determined              Anticipated d/c date is: 1 day              Patient currently is not medically stable to d/c.  Currently awaiting final tissue diagnosis from liver biopsy in the meantime will get PT/OT evaluation.  Disposition according to PT/OT thereafter [facility] team will need to coordinate his daily travel for radiation if he goes to SNF.    Subjective: Per patient and family slurred speech is pretty much resolved, cognition is improved.  Still having occasional visual disturbances.  Review of Systems Otherwise negative except as per HPI, including: General: Denies fever, chills, night sweats or unintended weight loss. Resp: Denies cough, wheezing, shortness of breath. Cardiac: Denies chest pain, palpitations, orthopnea, paroxysmal nocturnal dyspnea. GI: Denies abdominal pain,  nausea, vomiting, diarrhea or constipation GU: Denies dysuria, frequency, hesitancy or incontinence MS: Denies muscle aches, joint pain or swelling Neuro: Denies headache,  neurologic deficits (focal weakness, numbness, tingling), abnormal gait Psych: Denies anxiety, depression, SI/HI/AVH Skin: Denies new rashes or lesions ID: Denies sick contacts, exotic exposures, travel  Examination:  Constitutional: Not in acute distress, bilateral temporal wasting. Respiratory: Clear to auscultation bilaterally Cardiovascular: Normal sinus rhythm, no rubs Abdomen: Nontender nondistended good bowel sounds Musculoskeletal: No edema noted Skin: No rashes seen Neurologic: CN 2-12 grossly intact.  And nonfocal Psychiatric: Normal judgment and insight. Alert and oriented x 3. Normal mood.  Objective: Vitals:   08/25/19 1105 08/25/19 1400 08/25/19 1952 08/26/19 0407  BP: (!) 108/45 (!) 117/57 123/72 (!) 159/84  Pulse: 60 81 74 66  Resp: 16 20 16 17   Temp:  97.9 F (36.6 C) 98.4 F (36.9 C) 98 F (36.7 C)  TempSrc:   Oral Oral  SpO2: 95% 92% 95% 93%  Weight:      Height:        Intake/Output Summary (Last 24 hours) at 08/26/2019 0807 Last data filed at 08/26/2019 0407 Gross per 24 hour  Intake 240 ml  Output 900 ml  Net -660 ml   Filed Weights   08/24/19 0509  Weight: 63.5 kg     Data Reviewed:   CBC: Recent Labs  Lab 08/20/19 1600 08/24/19 0522 08/25/19 0317 08/26/19 0310  WBC 14.2* 13.2* 6.2 13.1*  NEUTROABS 11.7* 10.9*  --  11.5*  HGB 13.0 12.2* 11.2* 11.6*  HCT 41.2 38.8* 35.0* 36.1*  MCV 97.4 97.2 97.2 96.5  PLT 474* 460* 412* 800*   Basic Metabolic Panel: Recent Labs  Lab 08/20/19 1600 08/24/19 0522 08/25/19 0317 08/26/19 0310  NA 135 133* 134* 133*  K 4.7 3.7 4.4 4.4  CL 97* 97* 102 101  CO2 26 23 25 27   GLUCOSE 106* 141* 167* 164*  BUN 32* 27* 17 19  CREATININE 1.11 0.96 0.51* 0.68  CALCIUM 9.6 8.9 8.5* 8.7*  MG  --   --  1.8 1.9  PHOS  --   --   --  2.3*   GFR: Estimated Creatinine Clearance: 61.7 mL/min (by C-G formula based on SCr of 0.68 mg/dL). Liver Function Tests: Recent Labs  Lab 08/20/19 1600 08/24/19 0522  08/26/19 0310  AST 21 19 17   ALT 12 12 13   ALKPHOS 64 61 60  BILITOT 1.0 0.8 0.6  PROT 8.0 7.1 6.3*  ALBUMIN 3.9 3.4* 2.9*   No results for input(s): LIPASE, AMYLASE in the last 168 hours. No results for input(s): AMMONIA in the last 168 hours. Coagulation Profile: Recent Labs  Lab 08/24/19 0521  INR 1.0   Cardiac Enzymes: No results for input(s): CKTOTAL, CKMB, CKMBINDEX, TROPONINI in the last 168 hours. BNP (last 3 results) Recent Labs    10/07/18 1110  PROBNP 973*   HbA1C: No results for input(s): HGBA1C in the last 72 hours. CBG: No results for input(s): GLUCAP in the last 168 hours. Lipid Profile: No results for input(s): CHOL, HDL, LDLCALC, TRIG, CHOLHDL, LDLDIRECT in the last 72 hours. Thyroid Function Tests: No results for input(s): TSH, T4TOTAL, FREET4, T3FREE, THYROIDAB in the last 72 hours. Anemia Panel: No results for input(s): VITAMINB12, FOLATE, FERRITIN, TIBC, IRON, RETICCTPCT in the last 72 hours. Sepsis Labs: Recent Labs  Lab 08/24/19 0522 08/24/19 0744 08/25/19 0317 08/26/19 0310  PROCALCITON  --   --  <0.10 <0.10  LATICACIDVEN 1.6 0.7  --   --  Recent Results (from the past 240 hour(s))  SARS Coronavirus 2 by RT PCR (hospital order, performed in [facility] hospital lab) Nasopharyngeal Nasopharyngeal Swab     Status: None   Collection Time: 08/20/19  5:42 PM   Specimen: Nasopharyngeal Swab  Result Value Ref Range Status   SARS Coronavirus 2 NEGATIVE NEGATIVE Final    Comment: (NOTE) SARS-CoV-2 target nucleic acids are NOT DETECTED.  The SARS-CoV-2 RNA is generally detectable in upper and lower respiratory specimens during the acute phase of infection. The lowest concentration of SARS-CoV-2 viral copies this assay can detect is 250 copies / mL. A negative result does not preclude SARS-CoV-2 infection and should not be used as the sole basis for treatment or other patient management decisions.  A negative result may occur with improper  specimen collection / handling, submission of specimen other than nasopharyngeal swab, presence of viral mutation(s) within the areas targeted by this assay, and inadequate number of viral copies (<250 copies / mL). A negative result must be combined with clinical observations, patient history, and epidemiological information.  Fact Sheet for Patients:   StrictlyIdeas.no  Fact Sheet for Healthcare Providers: BankingDealers.co.za  This test is not yet approved or  cleared by the [place] FDA and has been authorized for detection and/or diagnosis of SARS-CoV-2 by FDA under an Emergency Use Authorization (EUA).  This EUA will remain in effect (meaning this test can be used) for the duration of the COVID-19 declaration under Section 564(b)(1) of the Act, 21 U.S.C. section 360bbb-3(b)(1), unless the authorization is terminated or revoked sooner.  Performed at [facility], [place] [address] [place], [place] [address]   Blood Culture (routine x 2)     Status: None (Preliminary result)   Collection Time: 08/24/19  5:21 AM   Specimen: BLOOD  Result Value Ref Range Status   Specimen Description   Final    BLOOD BLOOD RIGHT FOREARM Performed at [facility] [address] [place], [place] [address]    Special Requests   Final    BOTTLES DRAWN AEROBIC AND ANAEROBIC Blood Culture results may not be optimal due to an excessive volume of blood received in culture bottles Performed at [facility] [address] [place], [place] [address]    Culture   Final    NO GROWTH 2 DAYS Performed at [facility] [address], [place], [place] [address]    Report Status PENDING  Incomplete  Blood Culture (routine x 2)     Status: None (Preliminary result)   Collection Time: 08/24/19  5:33 AM   Specimen: BLOOD LEFT HAND  Result Value Ref Range Status   Specimen Description   Final      BLOOD LEFT HAND Performed at [facility] [address] [place], [place] [address]    Special Requests   Final    BOTTLES DRAWN AEROBIC AND ANAEROBIC Blood Culture results may not be optimal due to an inadequate volume of blood received in culture bottles Performed at [facility] [address] [place], [place] [address]    Culture   Final    NO GROWTH 2 DAYS Performed at [facility] [address] [place], [place] [address]    Report Status PENDING  Incomplete  Urine culture     Status: Abnormal (Preliminary result)   Collection Time: 08/24/19  5:33 AM   Specimen: In/Out Cath Urine  Result Value Ref Range  Status   Specimen Description   Final    IN/OUT CATH URINE Performed at [facility] [address] [place], [place] [address]    Special Requests   Final    NONE Performed at [facility], [place] [address] [place], [place] [address]    Culture (A)  Final    100 COLONIES/mL STAPHYLOCOCCUS EPIDERMIDIS CULTURE REINCUBATED FOR BETTER GROWTH Performed at [facility], [place] [address] [place], [place] [address]    Report Status PENDING  Incomplete  SARS Coronavirus 2 by RT PCR (hospital order, performed in [facility] hospital lab) Nasopharyngeal Nasopharyngeal Swab     Status: None   Collection Time: 08/24/19  8:32 AM   Specimen: Nasopharyngeal Swab  Result Value Ref Range Status   SARS Coronavirus 2 NEGATIVE NEGATIVE Final    Comment: (NOTE) SARS-CoV-2 target nucleic acids are NOT DETECTED.  The SARS-CoV-2 RNA is generally detectable in upper and lower respiratory specimens during the acute phase of infection. The lowest concentration of SARS-CoV-2 viral copies this assay can detect is 250 copies / mL. A negative result does not preclude SARS-CoV-2 infection and should not be used as the sole basis for treatment or other patient management decisions.  A negative result may  occur with improper specimen collection / handling, submission of specimen other than nasopharyngeal swab, presence of viral mutation(s) within the areas targeted by this assay, and inadequate number of viral copies (<250 copies / mL). A negative result must be combined with clinical observations, patient history, and epidemiological information.  Fact Sheet for Patients:   StrictlyIdeas.no  Fact Sheet for Healthcare Providers: BankingDealers.co.za  This test is not yet approved or  cleared by the [place] FDA and has been authorized for detection and/or diagnosis of SARS-CoV-2 by FDA under an Emergency Use Authorization (EUA).  This EUA will remain in effect (meaning this test can be used) for the duration of the COVID-19 declaration under Section 564(b)(1) of the Act, 21 U.S.C. section 360bbb-3(b)(1), unless the authorization is terminated or revoked sooner.  Performed at [facility], [place] [address], [place], [place] [address]   MRSA PCR Screening     Status: None   Collection Time: 08/24/19 11:31 PM   Specimen: Nasopharyngeal  Result Value Ref Range Status   MRSA by PCR NEGATIVE NEGATIVE Final    Comment:        The GeneXpert MRSA Assay (FDA approved for NASAL specimens only), is one component of a comprehensive MRSA colonization surveillance program. It is not intended to diagnose MRSA infection nor to guide or monitor treatment for MRSA infections. Performed at [facility], [place] [address] [place], [place] [address]          Radiology Studies: MR ABDOMEN W WO CONTRAST  Result Date: 08/25/2019 CLINICAL DATA:  86 year old male with history of abdominal mass. Suspected neoplasm. EXAM: MRI ABDOMEN WITHOUT AND WITH CONTRAST TECHNIQUE: Multiplanar multisequence MR imaging of the abdomen was performed both before and after the administration of intravenous contrast. CONTRAST:   57mL GADAVIST GADOBUTROL 1 MMOL/ML IV SOLN COMPARISON:  No prior abdominal MRI. CT the abdomen and pelvis 08/20/2019. FINDINGS: Lower chest: Unremarkable. Hepatobiliary: In the inferior aspect of segment 5 of the liver (axial image 56 of series 24) there is a 1.4 x 1.4 cm T1 hypointense, T2 iso to slightly hyperintense lesion which demonstrates some low-level internal enhancement, concerning for neoplasm. In segment 3 of the liver (axial image 43  of series 24) there is a subtle T1 hypointense, T2 isointense lesion which does not demonstrate definitive internal enhancement on post gadolinium images (although assessment is limited by the small size of the lesion and motion artifact which affects subtraction imaging). Status post cholecystectomy. No intrahepatic biliary ductal dilatation. Common bile duct measures 6 mm in the porta hepatis. However, the common hepatic duct is mildly dilated measuring up to 14 mm in diameter, and there multiple filling defects in the common hepatic duct, compatible with choledocholithiasis, largest of which measures up to 11 mm (axial image 19 of series 25). Pancreas: No pancreatic mass. No pancreatic ductal dilatation. No pancreatic or peripancreatic fluid collections or inflammatory changes. Spleen:  Unremarkable. Adrenals/Urinary Tract: There are multiple T1 hypointense, T2 hyperintense, nonenhancing lesions associated with both kidneys, largest of which is exophytic extending off the lower pole of the right kidney measuring up to 4.8 x 4.1 cm in diameter, compatible with simple cysts. No suspicious renal lesions are noted. No hydroureteronephrosis in the visualized portions of the abdomen. Bilateral adrenal glands are normal in appearance. Stomach/Bowel: Visualized portions are unremarkable. Vascular/Lymphatic: Aortic atherosclerosis, with aneurysmal dilatation of the right common iliac artery which measures up to 2.6 cm in diameter. No lymphadenopathy noted in the abdomen. Other: No  significant volume of ascites noted in the visualized portions of the peritoneal cavity. Musculoskeletal: No aggressive appearing osseous lesions are noted in the visualized portions of the skeleton. IMPRESSION: 1. There are 2 liver lesions. One of these, in the inferior aspect of segment 5 of the liver, has imaging characteristics concerning for malignancy, potentially a metastatic lesion. Other lesion in segment 3 is too small to definitively characterize, but does not appear to be a simple cyst or other clearly benign lesion. 2. Status post cholecystectomy with choledocholithiasis in the common hepatic duct. Although the common hepatic duct is mildly dilated, there is no other intrahepatic biliary ductal dilatation. Common bile duct is normal in caliber measuring 6 mm in the porta hepatis. 3. Aortic atherosclerosis, as well as aneurysmal dilatation of the right common iliac artery which measures up to 2.6 cm in diameter. e Electronically Signed   By: Vinnie Langton M.D.   On: 08/25/2019 08:26   IR US Guide Bx Asp/Drain  Result Date: 08/25/2019 INDICATION: Remote history of renal cell carcinoma, now with findings worrisome for metastatic lung cancer. Please perform ultrasound-guided biopsy indeterminate liver lesion for tissue diagnostic purposes. EXAM: ULTRASOUND GUIDED LIVER LESION BIOPSY COMPARISON:  Abdominal MRI-08/24/2019; CT of the chest, abdomen and pelvis-08/20/2019 MEDICATIONS: None ANESTHESIA/SEDATION: Fentanyl 100 mcg IV; Versed 2 mg IV Total Moderate Sedation time:  10 Minutes. The patient's level of consciousness and vital signs were monitored continuously by radiology nursing throughout the procedure under my direct supervision. COMPLICATIONS: None immediate. PROCEDURE: Informed written consent was obtained from the patient after a discussion of the risks, benefits and alternatives to treatment. The patient understands and consents the procedure. A timeout was performed prior to the initiation  of the procedure. Ultrasound scanning was performed of the right upper abdominal quadrant demonstrates an approximately 1.6 x 1.6 cm mixed echogenic nodule within the subcapsular caudal aspect of the right lobe of the liver correlating with the lesion seen on abdominal MRI image 56, series 24. The procedure was planned. The right upper abdominal quadrant was prepped and draped in the usual sterile fashion. The overlying soft tissues were anesthetized with 1% lidocaine with epinephrine. A 17 gauge, 6.8 cm co-axial needle was advanced into a peripheral  aspect of the lesion. This was followed by 5 core biopsies with an 18 gauge core device under direct ultrasound guidance. The coaxial needle tract was embolized with a small amount of Gel-Foam slurry and superficial hemostasis was obtained with manual compression. Post procedural scanning was negative for definitive area of hemorrhage or additional complication. A dressing was placed. The patient tolerated the procedure well without immediate post procedural complication. IMPRESSION: Technically successful ultrasound guided core needle biopsy of indeterminate lesion within the caudal aspect of the right lobe of the liver. Electronically Signed   By: Sandi Mariscal M.D.   On: 08/25/2019 12:01        Scheduled Meds: . bromocriptine  2.5 mg Oral BID  . dexamethasone  4 mg Oral Q12H  . enoxaparin (LOVENOX) injection  40 mg Subcutaneous Q24H  . feeding supplement (ENSURE ENLIVE)  237 mL Oral BID BM  . folic acid  2 mg Oral Daily  . hydrocortisone  10 mg Oral Q supper  . hydrocortisone  20 mg Oral Daily  . levothyroxine  150 mcg Oral QAC breakfast  . pantoprazole  40 mg Oral BID  . pravastatin  20 mg Oral q1800   Continuous Infusions:   LOS: 2 days   Time spent= 35 mins    Mekhia Brogan Arsenio Loader, MD [facility]  If 7PM-7AM, please contact night-coverage  08/26/2019, 8:07 AM

## 2019-08-26 NOTE — Evaluation (Signed)
Physical Therapy Evaluation Patient Details Name: Ryan Coffey MRN: [id number] DOB: 10/13/1932 Today's Date: 08/26/2019   History of Present Illness  86 year old male with history of seropositive rheumatoid arthritis, renal cell cancer , hyperlipidemia, pituitary tumor, Right lower lobe cavitary pneumonia in July 2020 admitted 08/24/19 with Generalized weakness, fevers. Recentlyin  ED visit for slurred speech, vision changes with findings on imaging concerning for new right upper lobe mass with metastatic lesions to brain. head, chest ,abdomen ,pelvis  Clinical Impression  The patient  Mobilized to bed edge, ambulated x 60' with Rw and min assistance for safety. Patient lives alone and limited family support. Patient was independent and driving until recently. Patient may benefit from post acute rehab, depending on medical status related to metastatic cancer to brain and other organs.  Pt admitted with above diagnosis.  Pt currently with functional limitations due to the deficits listed below (see PT Problem List). Pt will benefit from skilled PT to increase their independence and safety with mobility to allow discharge to the venue listed below.       Follow Up Recommendations SNF    Equipment Recommendations  Rolling walker with 5" wheels    Recommendations for Other Services   OT   Precautions / Restrictions Precautions Precautions: Fall      Mobility  Bed Mobility Overal bed mobility: Needs Assistance Bed Mobility: Supine to Sit     Supine to sit: Supervision;HOB elevated     General bed mobility comments: no physical assistance required  Transfers Overall transfer level: Needs assistance Equipment used: Rolling walker (2 wheeled) Transfers: Sit to/from Stand Sit to Stand: Min assist         General transfer comment: able to power up, steady asistance  Ambulation/Gait Ambulation/Gait assistance: Min assist Gait Distance (Feet): 60 Feet Assistive device: Rolling  walker (2 wheeled) Gait Pattern/deviations: Step-through pattern;Decreased step length - left Gait velocity: decr   General Gait Details: slight decreased right foot control during swing  Stairs            Wheelchair Mobility    Modified Rankin (Stroke Patients Only)       Balance Overall balance assessment: Needs assistance Sitting-balance support: Feet supported;No upper extremity supported Sitting balance-Leahy Scale: Good     Standing balance support: Bilateral upper extremity supported;During functional activity Standing balance-Leahy Scale: Poor Standing balance comment: reliant on UE support for balance                             Pertinent Vitals/Pain Pain Assessment: Faces Faces Pain Scale: Hurts even more Pain Location: left side from cough Pain Descriptors / Indicators: Discomfort Pain Intervention(s): Monitored during session;Patient requesting pain meds-RN notified    Home Living Family/patient expects to be discharged to:: Private residence Living Arrangements: Alone Available Help at Discharge: Available PRN/intermittently Type of Home: House Home Access: Stairs to enter Entrance Stairs-Rails: None Entrance Stairs-Number of Steps: 2-3 Home Layout: Two level;Full bath on main level;Able to live on main level with bedroom/bathroom Home Equipment: Shower seat;Walker - 2 wheels Additional Comments: family really cannot be availble 24/7    Prior Function Level of Independence: Independent         Comments: was driving recnetly     Hand Dominance        Extremity/Trunk Assessment   Upper Extremity Assessment Upper Extremity Assessment: LUE deficits/detail LUE Deficits / Details: finger deformities noted    Lower Extremity Assessment Lower Extremity  Assessment: RLE deficits/detail;LLE deficits/detail LLE Deficits / Details: RA deformities noted of foot    Cervical / Trunk Assessment Cervical / Trunk Assessment: Normal   Communication   Communication: No difficulties;HOH  Cognition Arousal/Alertness: Awake/alert Behavior During Therapy: WFL for tasks assessed/performed Overall Cognitive Status: Impaired/Different from baseline Area of Impairment: Orientation                 Orientation Level: Time             General Comments: Appears basic intact, daughter in background shaking head, mouthing that patient is not correct about events leading to coming to Hospital.      General Comments      Exercises     Assessment/Plan    PT Assessment Patient needs continued PT services  PT Problem List Decreased strength;Decreased balance;Decreased cognition;Decreased mobility;Decreased knowledge of use of DME;Decreased activity tolerance;Decreased safety awareness       PT Treatment Interventions DME instruction;Therapeutic activities;Gait training;Therapeutic exercise;Patient/family education;Balance training;Stair training;Functional mobility training    PT Goals (Current goals can be found in the Care Plan section)  Acute Rehab PT Goals Patient Stated Goal: to get stronger, go home PT Goal Formulation: With patient/family Time For Goal Achievement: 09/09/19 Potential to Achieve Goals: Fair    Frequency Min 2X/week   Barriers to discharge Decreased caregiver support      Co-evaluation               AM-PAC PT "6 Clicks" Mobility  Outcome Measure Help needed turning from your back to your side while in a flat bed without using bedrails?: None Help needed moving from lying on your back to sitting on the side of a flat bed without using bedrails?: A Little Help needed moving to and from a bed to a chair (including a wheelchair)?: A Little Help needed standing up from a chair using your arms (e.g., wheelchair or bedside chair)?: A Little Help needed to walk in hospital room?: A Lot Help needed climbing 3-5 steps with a railing? : A Lot 6 Click Score: 17    End of Session  Equipment Utilized During Treatment: Gait belt Activity Tolerance: Patient limited by fatigue Patient left: in chair;with call bell/phone within reach;with chair alarm set;with nursing/sitter in room;with family/visitor present Nurse Communication: Mobility status PT Visit Diagnosis: Unsteadiness on feet (R26.81);Muscle weakness (generalized) (M62.81);Pain Pain - Right/Left: Left    Time: 5638-7564 PT Time Calculation (min) (ACUTE ONLY): 31 min   Charges:   PT Evaluation $PT Eval Low Complexity: 1 Low PT Treatments $Gait Training: 8-22 mins        Tresa Endo PT Acute Rehabilitation Services Pager [phone] Office [phone]   Claretha Cooper 08/26/2019, 4:31 PM

## 2019-08-26 NOTE — TOC Initial Note (Signed)
Transition of Care [facility]) - Initial/Assessment Note    Patient Details  Name: Ryan Coffey MRN: [id number] Date of Birth: 1932/08/01  Transition of [facility]) CM/SW Contact:    Lennart Pall, LCSW Phone Number: 08/26/2019, 10:26 AM  Clinical Narrative:    Met with pt and daughter, Colletta [place], this morning to introduce myself and role to assist with d/c planning.  They had just met again with Palliative Care team and are reporting that they are agreed that pt will likely need rehab prior to returning home.  Family and friends are VERY supportive, however, both daughters live out of town.  Pt has become very weak and agrees that he does not feel safe in returning directly to his home alone.  They are aware that we are awaiting PT eval to confirm their recommendations.  Will continue to follow.              Expected Discharge Plan: Skilled Nursing Facility Barriers to Discharge: Continued Medical Work up   Patient Goals and CMS Choice Patient states their goals for this hospitalization and ongoing recovery are:: pt states his goal is to eventually be able to return home CMS Medicare.gov Compare Post Acute Care list provided to:: Patient Choice offered to / list presented to : Patient, Adult Children  Expected Discharge Plan and Services Expected Discharge Plan: [name] In-house Referral: Hospice / [place], Clinical Social Work   Post Acute Care Choice: [place] Living arrangements for the past 2 months: [place]                                      Prior Living Arrangements/Services Living arrangements for the past 2 months: Single Family Home Lives with:: Self Patient language and need for interpreter reviewed:: Yes Do you feel safe going back to the place where you live?: Yes (once rehab completed)      Need for Family Participation in Patient Care: Yes (Comment) Care giver support system in place?: Yes (comment)    Criminal Activity/Legal Involvement Pertinent to Current Situation/Hospitalization: No - Comment as needed  Activities of Daily Living Home Assistive Devices/Equipment: Eyeglasses, Environmental consultant (specify type), Shower chair with back ADL Screening (condition at time of admission) Patient's cognitive ability adequate to safely complete daily activities?: Yes Is the patient deaf or have difficulty hearing?: No Does the patient have difficulty seeing, even when wearing glasses/contacts?: Yes Does the patient have difficulty concentrating, remembering, or making decisions?: No Patient able to express need for assistance with ADLs?: Yes Does the patient have difficulty dressing or bathing?: Yes Independently performs ADLs?: No Communication: Independent Dressing (OT): Needs assistance Is this a change from baseline?: Pre-admission baseline Grooming: Needs assistance Is this a change from baseline?: Pre-admission baseline Feeding: Independent Bathing: Needs assistance Is this a change from baseline?: Pre-admission baseline Toileting: Needs assistance Is this a change from baseline?: Pre-admission baseline In/Out Bed: Needs assistance Is this a change from baseline?: Pre-admission baseline Walks in Home: Needs assistance Is this a change from baseline?: Pre-admission baseline Does the patient have difficulty walking or climbing stairs?: Yes Weakness of Legs: Both Weakness of Arms/Hands: Both  Permission Sought/Granted Permission sought to share information with : Facility Sport and exercise psychologist, Family Supports Permission granted to share information with : Yes, Verbal Permission Granted  Share Information with NAME: daughters:  Mal Amabile (lives in [place]) @ [phone] and Vevelyn Francois (lives  in [place]) @ [phone]           Emotional Assessment Appearance:: Appears stated age Attitude/Demeanor/Rapport: Engaged, Gracious Affect (typically observed): Accepting,  Pleasant, Tearful/Crying Orientation: : Oriented to Self, Oriented to Place, Oriented to  Time, Oriented to Situation Alcohol / Substance Use: Not Applicable Psych Involvement: No (comment)  Admission diagnosis:  Hypoxia [R09.02] Fever [R50.9] SIRS (systemic inflammatory response syndrome) (HCC) [R65.10] Mass of right lung [R91.8] Patient Active Problem List   Diagnosis Date Noted  . Goals of care, counseling/discussion   . Palliative care by specialist   . Lung mass   . Fever 08/24/2019  . Hemoptysis 08/24/2019  . Metastatic lung carcinoma, right ([place]) 08/24/2019  . Acute respiratory failure with hypoxia ([place]) 08/24/2019  . Abnormal findings on diagnostic imaging of lung 10/07/2018  . Dyspnea on exertion 10/07/2018  . Pneumonia of right lower lobe due to infectious organism 10/07/2018  . Pressure injury of skin 09/22/2018  . HAP (hospital-acquired pneumonia) 09/18/2018  . Immunocompromised state ([place]) 09/18/2018  . Other emphysema ([place]) 08/29/2018  . Unintentional weight loss 06/18/2018  . Chronic cough 06/18/2018  . COPD (chronic obstructive pulmonary disease) ([place]) 08/29/2016  . Rheumatoid nodulosis ([place]) 08/24/2016  . High risk medication use 08/24/2016  . Thrombocytosis ([place]) 08/24/2016  . Elevated LFTs mild elevation of ALT  08/24/2016  . History of renal cell carcinoma 08/24/2016  . History of prostate cancer 08/24/2016  . History of adrenal insufficiency 08/24/2016  . HCAP (healthcare-associated pneumonia) 06/27/2011    Class: Acute  . Seropositive rheumatoid arthritis ([place]) 06/27/2011  . Panhypopituitarism ([place]) 06/27/2011  . Degenerative joint disease 06/27/2011  . History of tobacco use 06/27/2011  . Hyperlipidemia 06/27/2011   PCP:  Prince Solian, MD Pharmacy:   [id number] [id number] [id number]: [phone]: [phone]    Social  Determinants of Health (SDOH) Interventions    Readmission Risk Interventions Readmission Risk Prevention Plan 08/26/2019 09/25/2018  Transportation Screening Complete Complete  PCP or Specialist Appt within 5-7 Days Complete Complete  Home Care Screening Complete Complete  Medication Review (RN CM) Complete Complete  Some recent data might be hidden

## 2019-08-27 ENCOUNTER — Telehealth: Payer: Self-pay | Admitting: Internal Medicine

## 2019-08-27 ENCOUNTER — Inpatient Hospital Stay: Payer: Medicare Other | Admitting: Internal Medicine

## 2019-08-27 ENCOUNTER — Ambulatory Visit
Admit: 2019-08-27 | Discharge: 2019-08-27 | Disposition: A | Discharge status: Home / Self Care | Payer: Medicare Other | Attending: Radiation Oncology | Admitting: Radiation Oncology

## 2019-08-27 DIAGNOSIS — R509 Fever, unspecified: Secondary | ICD-10-CM

## 2019-08-27 LAB — BASIC METABOLIC PANEL
Anion gap: 8 (ref 5–15)
BUN: 21 mg/dL (ref 8–23)
CO2: 27 mmol/L (ref 22–32)
Calcium: 8.7 mg/dL — ABNORMAL LOW (ref 8.9–10.3)
Chloride: 98 mmol/L (ref 98–111)
Creatinine, Ser: 0.57 mg/dL — ABNORMAL LOW (ref 0.61–1.24)
GFR calc Af Amer: >60 mL/min (ref >60)
GFR calc non Af Amer: >60 mL/min (ref >60)
Glucose, Bld: 133 mg/dL — ABNORMAL HIGH (ref 70–99)
Potassium: 3.9 mmol/L (ref 3.5–5.1)
Sodium: 133 mmol/L — ABNORMAL LOW (ref 135–145)

## 2019-08-27 LAB — URINE CULTURE: Culture: 100 — AB

## 2019-08-27 LAB — CBC
HCT: 35.9 % — ABNORMAL LOW (ref 39.0–52.0)
Hemoglobin: 11.9 g/dL — ABNORMAL LOW (ref 13.0–17.0)
MCH: 31.1 pg (ref 26.0–34.0)
MCHC: 33.1 g/dL (ref 30.0–36.0)
MCV: 93.7 fL (ref 80.0–100.0)
Platelets: 498 10*3/uL — ABNORMAL HIGH (ref 150–400)
RBC: 3.83 MIL/uL — ABNORMAL LOW (ref 4.22–5.81)
RDW: 14.7 % (ref 11.5–15.5)
WBC: 12.2 10*3/uL — ABNORMAL HIGH (ref 4.0–10.5)
nRBC: 0 % (ref 0.0–0.2)

## 2019-08-27 LAB — MAGNESIUM: Magnesium: 2.1 mg/dL (ref 1.7–2.4)

## 2019-08-27 LAB — SURGICAL PATHOLOGY

## 2019-08-27 MED ORDER — ENSURE ENLIVE PO LIQD: 237.0000 mL | Freq: Two times a day (BID) | ORAL | 12 refills | Status: AC

## 2019-08-27 MED ORDER — POLYETHYLENE GLYCOL 3350 17 G PO PACK: 17.0000 g | PACK | Freq: Every day | ORAL | 0 refills | Status: AC | PRN

## 2019-08-27 MED ORDER — DEXAMETHASONE 4 MG PO TABS: 4.0000 mg | ORAL_TABLET | Freq: Two times a day (BID) | ORAL | Status: AC

## 2019-08-27 MED ORDER — SENNOSIDES-DOCUSATE SODIUM 8.6-50 MG PO TABS: 2.0000 | ORAL_TABLET | Freq: Every evening | ORAL | Status: AC | PRN

## 2019-08-27 MED ORDER — OXYCODONE HCL 5 MG PO TABS: 5.0000 mg | ORAL_TABLET | Freq: Four times a day (QID) | ORAL | 0 refills | Status: DC | PRN

## 2019-08-27 NOTE — NC FL2 (Signed)
[place] LEVEL OF CARE SCREENING TOOL     IDENTIFICATION  Patient Name: Ryan Coffey  Birthdate: 25-Mar-1932 Sex: male Admission Date (Current Location): 08/24/2019  [facility] and [place] Number:  Herbalist and Address:  [facility],  [address] [place], [place]      Provider Number: [id number]  Attending Physician Name and Address:  Damita Lack, MD  Relative Name and Phone Number:  daughter, Mal Amabile @ [phone]    Current Level of Care: SNF Recommended Level of Care: [place] Prior Approval Number:    Date Approved/Denied:   PASRR Number: [id number]  Discharge Plan: SNF    Current Diagnoses: Patient Active Problem List   Diagnosis Date Noted  . Goals of care, counseling/discussion   . Palliative care by specialist   . Lung mass   . Fever 08/24/2019  . Hemoptysis 08/24/2019  . Metastatic lung carcinoma, right ([place]) 08/24/2019  . Acute respiratory failure with hypoxia ([place]) 08/24/2019  . Abnormal findings on diagnostic imaging of lung 10/07/2018  . Dyspnea on exertion 10/07/2018  . Pneumonia of right lower lobe due to infectious organism 10/07/2018  . Pressure injury of skin 09/22/2018  . HAP (hospital-acquired pneumonia) 09/18/2018  . Immunocompromised state ([place]) 09/18/2018  . Other emphysema ([place]) 08/29/2018  . Unintentional weight loss 06/18/2018  . Chronic cough 06/18/2018  . COPD (chronic obstructive pulmonary disease) ([place]) 08/29/2016  . Rheumatoid nodulosis ([place]) 08/24/2016  . High risk medication use 08/24/2016  . Thrombocytosis ([place]) 08/24/2016  . Elevated LFTs mild elevation of ALT  08/24/2016  . History of renal cell carcinoma 08/24/2016  . History of prostate cancer 08/24/2016  . History of adrenal insufficiency 08/24/2016  . HCAP (healthcare-associated pneumonia) 06/27/2011  . Seropositive rheumatoid arthritis ([place]) 06/27/2011  . Panhypopituitarism ([place]) 06/27/2011   . Degenerative joint disease 06/27/2011  . History of tobacco use 06/27/2011  . Hyperlipidemia 06/27/2011    Orientation RESPIRATION BLADDER Height & Weight     Self, Time, Situation, Place  Normal Continent Weight: 140 lb (63.5 kg) Height:  5\' 8"  (172.7 cm)  BEHAVIORAL SYMPTOMS/MOOD NEUROLOGICAL BOWEL NUTRITION STATUS      Continent    AMBULATORY STATUS COMMUNICATION OF NEEDS Skin   Limited Assist Verbally Normal                       Personal Care Assistance Level of Assistance  Bathing, Dressing Bathing Assistance: Limited assistance   Dressing Assistance: Limited assistance     Functional Limitations Info             SPECIAL CARE FACTORS FREQUENCY  PT (By licensed PT), OT (By licensed OT)     PT Frequency: 5x/wk OT Frequency: 5x/wk            Contractures      Additional Factors Info  Code Status, Allergies Code Status Info: DNR Allergies Info: NKDA           Current Medications (08/27/2019):  This is the current hospital active medication list Current Facility-Administered Medications  Medication Dose Route Frequency Provider Last Rate Last Admin  . acetaminophen (TYLENOL) tablet 650 mg  650 mg Oral Q6H PRN Nicole Kindred A, DO       Or  . acetaminophen (TYLENOL) suppository 650 mg  650 mg Rectal Q6H PRN Nicole Kindred A, DO      . bisacodyl (DULCOLAX) EC tablet 5 mg  5 mg Oral Daily PRN [place] Ped,  Kelly A, DO      . bromocriptine (PARLODEL) tablet 2.5 mg  2.5 mg Oral BID Nicole Kindred A, DO   2.5 mg at 08/26/19 2153  . dexamethasone (DECADRON) tablet 4 mg  4 mg Oral Q12H Nicole Kindred A, DO   4 mg at 08/26/19 2148  . enoxaparin (LOVENOX) injection 40 mg  40 mg Subcutaneous Q24H Green, Terri L, RPH   40 mg at 08/26/19 0900  . feeding supplement (ENSURE ENLIVE) (ENSURE ENLIVE) liquid 237 mL  237 mL Oral BID BM Nicole Kindred A, DO   237 mL at 08/26/19 1834  . folic acid (FOLVITE) tablet 2 mg  2 mg Oral Daily Nicole Kindred A, DO   2 mg at  08/26/19 0900  . guaiFENesin-dextromethorphan (ROBITUSSIN DM) 100-10 MG/5ML syrup 5 mL  5 mL Oral Q4H PRN Nicole Kindred A, DO      . hydrocortisone (CORTEF) tablet 10 mg  10 mg Oral Q supper Sheikh, Omair [place], DO   10 mg at 08/26/19 1833  . hydrocortisone (CORTEF) tablet 20 mg  20 mg Oral Daily Nicole Kindred A, DO   20 mg at 08/27/19 0805  . ipratropium-albuterol (DUONEB) 0.5-2.5 (3) MG/3ML nebulizer solution 3 mL  3 mL Nebulization Q6H PRN Nicole Kindred A, DO      . levothyroxine (SYNTHROID) tablet 150 mcg  150 mcg Oral QAC breakfast Nicole Kindred A, DO   150 mcg at 08/27/19 0555  . ondansetron (ZOFRAN) tablet 4 mg  4 mg Oral Q6H PRN Nicole Kindred A, DO       Or  . ondansetron (ZOFRAN) injection 4 mg  4 mg Intravenous Q6H PRN Nicole Kindred A, DO      . oxyCODONE (Oxy IR/ROXICODONE) immediate release tablet 5 mg  5 mg Oral Q6H PRN Nicole Kindred A, DO   5 mg at 08/26/19 1405  . pantoprazole (PROTONIX) EC tablet 40 mg  40 mg Oral BID Raiford Noble [place], DO   40 mg at 08/26/19 2148  . polyethylene glycol (MIRALAX / GLYCOLAX) packet 17 g  17 g Oral Daily PRN Amin, Ankit Chirag, MD   17 g at 08/26/19 1405  . pravastatin (PRAVACHOL) tablet 20 mg  20 mg Oral q1800 Nicole Kindred A, DO   20 mg at 08/26/19 1833  . senna-docusate (Senokot-S) tablet 2 tablet  2 tablet Oral BID Damita Lack, MD   2 tablet at 08/26/19 2151  . zolpidem (AMBIEN) tablet 5 mg  5 mg Oral QHS PRN Nicole Kindred A, DO   5 mg at 08/26/19 2148     Discharge Medications: Please see discharge summary for a list of discharge medications.  Relevant Imaging Results:  Relevant Lab Results:   Additional Information Pt has ~ 9 more radiation txs remaining.  Family can provide the transport if needed.   SS# [phone]  Lennart Pall, LCSW

## 2019-08-27 NOTE — Telephone Encounter (Signed)
Pt's daughter cld to cancel appt that's scheduled to see [name] d/t to pt being in the hospital.

## 2019-08-27 NOTE — Discharge Summary (Addendum)
Physician Discharge Summary  HOSTEEN KIENAST LFY:[id number] DOB: [birth year] DOA: 08/24/2019  PCP: Prince Solian, MD  Admit date: 08/24/2019 Discharge date: 08/28/19  Admitted From: Home Disposition: SNF  Recommendations for Outpatient Follow-up:  1. Follow up with PCP in 1-2 weeks 2. Please obtain BMP/CBC in one week your next doctors visit.  3. Whole brain radiation planned, total of 10 treatments.  Last day 7/7 4. Follow-up outpatient with oncology once biopsy results are available 5. Outpatient follow-up with pulmonary in 3 months 6. Outpatient PET scan on upcoming Monday   Discharge Condition: Stable CODE STATUS: DNR Diet recommendation: Regular  Brief/Interim Summary: 86 year old with history of renal cell cancer in remission, HLD, pituitary tumor, right lower lobe cavitary pneumonia, rheumatoid arthritis presented with generalized weakness and fevers.  Found to have new spiculated mass in the right upper lobe with metastatic brain disease and liver lesions on MRI abdomen.  Recurrent fevers without any source being on Decadron.  Radiation oncology/oncology team following. S/p Liver lesion biopsy on 08/25/19. No longer on Abx, ProCal negative.  Biopsy results pending on the day of discharge, started on whole brain radiation in the hospital.  Total 10 treatments have been planned, last day 7/7.  Started on Decadron, resume other home medication, continue PPI. Stable for SNF.   Assessment & Plan:   Principal Problem:   Fever Active Problems:   Seropositive rheumatoid arthritis ([place])   Panhypopituitarism (HCC)   Hyperlipidemia   Thrombocytosis (HCC)   COPD (chronic obstructive pulmonary disease) (HCC)   Unintentional weight loss   Immunocompromised state ([place])   Hemoptysis   Metastatic lung carcinoma, right (HCC)   Acute respiratory failure with hypoxia (HCC)   Goals of care, counseling/discussion   Palliative care by specialist   Lung mass  Fever, unknown etiology.   SIRS Right-sided lung mass with brain metastases History of renal cell cancer Cultures remain negative, no antibiotics needed Status post biopsy of liver lesion, prelim shows Lung malignancy.   Whole brain radiation started 6/23, last day 7/7. Seen by oncology as well.  Follow-up outpatient once biopsy results are available, their service will make outpatient arrangements Follow-up outpatient pulmonary in 3 months Decadron 4 mg every 12 hours. Outpatient PET scan scheduled 6/28. Outpatient appointment with neuro oncology, [name] 6/24  AcuteRespiratoryFailure withHypoxia, resolved Supplemental oxygen.  As needed bronchodilators.  Hemoptysis Resolved, supportive care  COPD As needed bronchodilators, supplemental oxygen  Thrombocytosis Stable around   Normocytic Anemia Stable around 11.6  RheumatoidArthritis -Patient on methotrexate, next dose Saturday (6/26)but will hold for now and will need to discuss with Rheumatology when to resume. -Continue folic acid.  Panhypopituitarism. -On home testosterone shots -ContinueHydrocortisone10 mg po Daily with Supper and 20 mg po Dail,Levothyroxine 150 mcg po Daily and Bromocriptine2.5 mg po BID  Hyperlipidemia -ContinuePravastatin20 mg po Daily  UnintentionalWeightLoss -Secondary to malignancy and poor p.o. intake. Dietitian was consulted.  ImmunocompromisedState -Due to malignancy and rheumatoid arthritis. -Broad-spectrum antibiotics as abovenow stopped  Insomnia -Continue home Ambien5 mg po qHS    Discharge Diagnoses:  Principal Problem:   Fever Active Problems:   Seropositive rheumatoid arthritis ([employer])   Panhypopituitarism (HCC)   Hyperlipidemia   Thrombocytosis (HCC)   COPD (chronic obstructive pulmonary disease) (HCC)   Unintentional weight loss   Immunocompromised state ([place])   Hemoptysis   Metastatic lung carcinoma, right ([place])   Acute respiratory failure with  hypoxia (HCC)   Goals of care, counseling/discussion   Palliative care by specialist   Lung mass  Consultations:  Oncology  Pulmonary  Palliative  Radiation oncology  Subjective: Feels okay no complaints this morning.  Still having some visual difficulty but his cognition is much better, in better spirits and feels slightly stronger  Discharge Exam: Vitals:   08/26/19 2018 08/27/19 0622  BP: 127/77 (!) 173/84  Pulse: 76 70  Resp: 18 18  Temp: 98 F (36.7 C) 98.5 F (36.9 C)  SpO2:  96%   Vitals:   08/26/19 0407 08/26/19 1350 08/26/19 2018 08/27/19 0622  BP: (!) 159/84 130/66 127/77 (!) 173/84  Pulse: 66 74 76 70  Resp: 17 (!) 24 18 18   Temp: 98 F (36.7 C) 98 F (36.7 C) 98 F (36.7 C) 98.5 F (36.9 C)  TempSrc: Oral Oral    SpO2: 93% 95%  96%  Weight:      Height:        General: Pt is alert, awake, not in acute distress, 86-year frail with bilateral temporal wasting Cardiovascular: RRR, S1/S2 +, no rubs, no gallops Respiratory: CTA bilaterally, no wheezing, no rhonchi Abdominal: Soft, NT, ND, bowel sounds + Extremities: no edema, no cyanosis  Discharge Instructions   Allergies as of 08/27/2019   No Known Allergies     Medication List    TAKE these medications   acetaminophen 325 MG tablet Commonly known as: TYLENOL Take 650 mg by mouth 2 (two) times daily as needed (arthritis).   bromocriptine 2.5 MG tablet Commonly known as: PARLODEL Take 2.5 mg by mouth 2 (two) times daily.   dexamethasone 4 MG tablet Commonly known as: DECADRON Take 1 tablet (4 mg total) by mouth every 12 (twelve) hours.   feeding supplement (ENSURE ENLIVE) Liqd Take 237 mLs by mouth 2 (two) times daily between meals.   folic acid 1 MG tablet Commonly known as: FOLVITE TAKE 2 TABLETS EVERY DAY   guaiFENesin-dextromethorphan 100-10 MG/5ML syrup Commonly known as: ROBITUSSIN DM Take 10 mLs by mouth every 4 (four) hours as needed for cough.   hydrocortisone 20  MG tablet Commonly known as: CORTEF Take 10-20 mg by mouth daily. 20 mg in the morning and 10mg  at night   levothyroxine 150 MCG tablet Commonly known as: SYNTHROID Take 150 mcg by mouth daily before breakfast.   methotrexate 2.5 MG tablet Commonly known as: RHEUMATREX TAKE 4 TABLETS ON SATURDAY AND 4 TABLETS ON SUNDAY. CAUTION:CHEMOTHERAPY. PROTECT FROM LIGHT. What changed: See the new instructions.   oxyCODONE 5 MG immediate release tablet Commonly known as: Oxy IR/ROXICODONE Take 1 tablet (5 mg total) by mouth every 6 (six) hours as needed for severe pain.   pantoprazole 40 MG tablet Commonly known as: PROTONIX Take 1 tablet (40 mg total) by mouth daily.   polyethylene glycol 17 g packet Commonly known as: MIRALAX / GLYCOLAX Take 17 g by mouth daily as needed for moderate constipation or severe constipation.   pravastatin 20 MG tablet Commonly known as: PRAVACHOL Take 20 mg by mouth daily.   senna-docusate 8.6-50 MG tablet Commonly known as: Senokot-S Take 2 tablets by mouth at bedtime as needed for mild constipation.   testosterone cypionate 200 MG/ML injection Commonly known as: DEPOTESTOSTERONE CYPIONATE Inject 75 mg into the muscle every 14 (fourteen) days.   zolpidem 6.25 MG CR tablet Commonly known as: Ambien CR Take 1 tablet (6.25 mg total) by mouth at bedtime as needed for sleep. What changed: when to take this       Follow-up Information    Marshell Garfinkel, MD. Schedule an  appointment as soon as possible for a visit.   Specialty: Pulmonary Disease Why: Call to make an appointment to be seen within 3 months by Dr. Weber Cooks information: [address] [place] [address] [phone]        Prince Solian, MD. Schedule an appointment as soon as possible for a visit in 1 week(s).   Specialty: Internal Medicine Contact information: [place] [address] [phone]        Curt Bears, MD Follow up.    Specialty: Oncology Why: They will call with follow up  Contact information: [place] [address] [phone]              No Known Allergies  You were cared for by a hospitalist during your hospital stay. If you have any questions about your discharge medications or the care you received while you were in the hospital after you are discharged, you can call the unit and asked to speak with the hospitalist on call if the hospitalist that took care of you is not available. Once you are discharged, your primary care physician will handle any further medical issues. Please note that no refills for any discharge medications will be authorized once you are discharged, as it is imperative that you return to your primary care physician (or establish a relationship with a primary care physician if you do not have one) for your aftercare needs so that they can reassess your need for medications and monitor your lab values.   Procedures/Studies: DG Chest 2 View  Result Date: 08/24/2019 CLINICAL DATA:  Fever.  Probable lung cancer. EXAM: CHEST - 2 VIEW COMPARISON:  CT of the chest, abdomen, and pelvis 08/20/2019. FINDINGS: Heart size normal. Atherosclerotic changes are present at the aortic arch. Right upper lobe spiculated mass in perihilar opacities are again noted. No new airspace disease is present. IMPRESSION: 1. Stable right upper lobe mass and perihilar opacities. 2. No new airspace disease. 3. Atherosclerosis. Electronically Signed   By: San Morelle M.D.   On: 08/24/2019 07:03   DG Chest 2 View  Result Date: 08/20/2019 CLINICAL DATA:  Cough, left rib pain for 2 weeks EXAM: CHEST - 2 VIEW COMPARISON:  10/07/2018 FINDINGS: Frontal and lateral views of the chest demonstrate a stable cardiac silhouette. Persistent areas of scarring are seen within the superior segment right lower lobe. No new airspace disease, effusion, or pneumothorax. Significant background  emphysema. Severe right shoulder osteoarthritis. IMPRESSION: 1. Stable scarring within the right lower lobe. 2. Emphysema. 3. No acute process. Electronically Signed   By: Randa Ngo M.D.   On: 08/20/2019 16:32   DG Ribs Unilateral Left  Result Date: 08/20/2019 CLINICAL DATA:  Cough, left rib pain for 2 weeks EXAM: LEFT RIBS - 2 VIEW COMPARISON:  10/07/2018 FINDINGS: Frontal and oblique views of the left thoracic cage are obtained. There are no acute displaced rib fractures. Visualized portions of the left lung are clear. Chronic scarring within the right lower lobe again noted. IMPRESSION: 1. No acute displaced fracture. Electronically Signed   By: Randa Ngo M.D.   On: 08/20/2019 16:34   CT Head Wo Contrast  Result Date: 08/20/2019 CLINICAL DATA:  Slurred speech and left facial droop.  Lethargy. EXAM: CT HEAD WITHOUT CONTRAST TECHNIQUE: Contiguous axial images were obtained from the base of the skull through the vertex without intravenous contrast. COMPARISON:  None. FINDINGS: Brain: Multiple brain masses disseminated throughout the  cerebellum and both cerebral hemispheres consistent with advanced intracranial metastatic disease. Most of these are associated with petechial blood products. No frank hematoma. Many of the lesions are associated with vasogenic edema. There is no midline shift. No hydrocephalus or extra-axial collection. Index left cerebellar lesion image 8 measures 14 mm. Index right temporal lesion image 13 measures 28 mm. Index left occipital lesion image 16 measures 23 mm. Index left frontal lesion image 16 measures 28 mm. Index right frontoparietal lesion image 20 for measures 17 mm. Vascular: There is atherosclerotic calcification of the major vessels at the base of the brain. Skull: Previous trans-sphenoidal surgery. No lytic calvarial lesion. Sinuses/Orbits: Clear/normal Other: None IMPRESSION: Advanced widespread metastatic disease throughout the brain. In total, there are  probably 40-50 lesions. Most are associated with petechial blood products. Many are associated with vasogenic edema. No midline shift. No hydrocephalus. Lung cancer is the most common primary tumor to present with this pattern. Distant trans-sphenoidal pituitary surgery. Electronically Signed   By: Nelson Chimes M.D.   On: 08/20/2019 15:48   CT Chest W Contrast  Result Date: 08/20/2019 CLINICAL DATA:  Altered level of consciousness, diffuse intracranial metastases with unknown primary malignancy EXAM: CT CHEST, ABDOMEN, AND PELVIS WITH CONTRAST TECHNIQUE: Multidetector CT imaging of the chest, abdomen and pelvis was performed following the standard protocol during bolus administration of intravenous contrast. CONTRAST:  141mL OMNIPAQUE IOHEXOL 300 MG/ML  SOLN COMPARISON:  09/22/2018 FINDINGS: CT CHEST FINDINGS Cardiovascular: Heart and great vessels are unremarkable without pericardial effusion. Moderate atherosclerosis of the aorta and coronary vessels. Mediastinum/Nodes: No enlarged mediastinal, hilar, or axillary lymph nodes. Thyroid gland, trachea, and esophagus demonstrate no significant findings. Lungs/Pleura: Extensive background emphysema again noted. The cavitating pneumonia within the right lower lobe on prior study has resolved, with resulting scarring within the superior segment right lower lobe. There is a new rounded somewhat spiculated mass within the periphery of the right upper lobe, measuring 2.6 x 3.8 cm on image 26 of series 2. No effusion or pneumothorax. Central airways are patent. Musculoskeletal: There are no acute or destructive bony lesions. Reconstructed images demonstrate no additional findings. CT ABDOMEN PELVIS FINDINGS Hepatobiliary: There is decreased attenuation throughout the majority of the liver parenchyma consistent with hepatic steatosis. Higher attenuation within segment 7 likely reflects fatty sparing. No mass effect. The gallbladder is surgically absent. Pancreas:  Unremarkable. No pancreatic ductal dilatation or surrounding inflammatory changes. Spleen: Normal in size without focal abnormality. Adrenals/Urinary Tract: 5 mm nonobstructing right renal calculus. There are numerous bilateral renal cortical cysts. The adrenals are unremarkable. Bladder is decompressed, which limits evaluation. Stomach/Bowel: No bowel obstruction or ileus. Diffuse diverticulosis of the distal colon without diverticulitis. No bowel wall thickening or inflammatory change. Vascular/Lymphatic: Extensive atherosclerosis of the abdominal aorta. There is aneurysmal dilatation of the right common iliac artery measuring up to 2.8 cm. No pathologic adenopathy. Reproductive: Prostate is unremarkable. Other: No free fluid or free gas. No abdominal wall hernia. Musculoskeletal: No acute or destructive bony lesions. Reconstructed images demonstrate no additional findings. IMPRESSION: 1. New rounded somewhat spiculated mass within the periphery of the right upper lobe, measuring 2.6 x 3.8 cm, concerning for malignancy. PET-CT may be useful for further evaluation. 2. Resolution of the previously seen cavitating pneumonia within the right lower lobe, with resulting scarring. 3. Hepatic steatosis, with fatty sparing in the posterior right lobe. 4. A 5 mm nonobstructing right renal calculus. 5. Aneurysmal dilatation of the right common iliac artery measuring up to 2.8 cm. 6. Aortic  Atherosclerosis (ICD10-I70.0) and Emphysema (ICD10-J43.9). Electronically Signed   By: Randa Ngo M.D.   On: 08/20/2019 23:28   MR ABDOMEN W WO CONTRAST  Result Date: 08/25/2019 CLINICAL DATA:  86 year old male with history of abdominal mass. Suspected neoplasm. EXAM: MRI ABDOMEN WITHOUT AND WITH CONTRAST TECHNIQUE: Multiplanar multisequence MR imaging of the abdomen was performed both before and after the administration of intravenous contrast. CONTRAST:  67mL GADAVIST GADOBUTROL 1 MMOL/ML IV SOLN COMPARISON:  No prior abdominal  MRI. CT the abdomen and pelvis 08/20/2019. FINDINGS: Lower chest: Unremarkable. Hepatobiliary: In the inferior aspect of segment 5 of the liver (axial image 56 of series 24) there is a 1.4 x 1.4 cm T1 hypointense, T2 iso to slightly hyperintense lesion which demonstrates some low-level internal enhancement, concerning for neoplasm. In segment 3 of the liver (axial image 43 of series 24) there is a subtle T1 hypointense, T2 isointense lesion which does not demonstrate definitive internal enhancement on post gadolinium images (although assessment is limited by the small size of the lesion and motion artifact which affects subtraction imaging). Status post cholecystectomy. No intrahepatic biliary ductal dilatation. Common bile duct measures 6 mm in the porta hepatis. However, the common hepatic duct is mildly dilated measuring up to 14 mm in diameter, and there multiple filling defects in the common hepatic duct, compatible with choledocholithiasis, largest of which measures up to 11 mm (axial image 19 of series 25). Pancreas: No pancreatic mass. No pancreatic ductal dilatation. No pancreatic or peripancreatic fluid collections or inflammatory changes. Spleen:  Unremarkable. Adrenals/Urinary Tract: There are multiple T1 hypointense, T2 hyperintense, nonenhancing lesions associated with both kidneys, largest of which is exophytic extending off the lower pole of the right kidney measuring up to 4.8 x 4.1 cm in diameter, compatible with simple cysts. No suspicious renal lesions are noted. No hydroureteronephrosis in the visualized portions of the abdomen. Bilateral adrenal glands are normal in appearance. Stomach/Bowel: Visualized portions are unremarkable. Vascular/Lymphatic: Aortic atherosclerosis, with aneurysmal dilatation of the right common iliac artery which measures up to 2.6 cm in diameter. No lymphadenopathy noted in the abdomen. Other: No significant volume of ascites noted in the visualized portions of the  peritoneal cavity. Musculoskeletal: No aggressive appearing osseous lesions are noted in the visualized portions of the skeleton. IMPRESSION: 1. There are 2 liver lesions. One of these, in the inferior aspect of segment 5 of the liver, has imaging characteristics concerning for malignancy, potentially a metastatic lesion. Other lesion in segment 3 is too small to definitively characterize, but does not appear to be a simple cyst or other clearly benign lesion. 2. Status post cholecystectomy with choledocholithiasis in the common hepatic duct. Although the common hepatic duct is mildly dilated, there is no other intrahepatic biliary ductal dilatation. Common bile duct is normal in caliber measuring 6 mm in the porta hepatis. 3. Aortic atherosclerosis, as well as aneurysmal dilatation of the right common iliac artery which measures up to 2.6 cm in diameter. e Electronically Signed   By: Vinnie Langton M.D.   On: 08/25/2019 08:26   CT Abdomen Pelvis W Contrast  Result Date: 08/20/2019 CLINICAL DATA:  Altered level of consciousness, diffuse intracranial metastases with unknown primary malignancy EXAM: CT CHEST, ABDOMEN, AND PELVIS WITH CONTRAST TECHNIQUE: Multidetector CT imaging of the chest, abdomen and pelvis was performed following the standard protocol during bolus administration of intravenous contrast. CONTRAST:  147mL OMNIPAQUE IOHEXOL 300 MG/ML  SOLN COMPARISON:  09/22/2018 FINDINGS: CT CHEST FINDINGS Cardiovascular: Heart and great vessels  are unremarkable without pericardial effusion. Moderate atherosclerosis of the aorta and coronary vessels. Mediastinum/Nodes: No enlarged mediastinal, hilar, or axillary lymph nodes. Thyroid gland, trachea, and esophagus demonstrate no significant findings. Lungs/Pleura: Extensive background emphysema again noted. The cavitating pneumonia within the right lower lobe on prior study has resolved, with resulting scarring within the superior segment right lower lobe. There  is a new rounded somewhat spiculated mass within the periphery of the right upper lobe, measuring 2.6 x 3.8 cm on image 26 of series 2. No effusion or pneumothorax. Central airways are patent. Musculoskeletal: There are no acute or destructive bony lesions. Reconstructed images demonstrate no additional findings. CT ABDOMEN PELVIS FINDINGS Hepatobiliary: There is decreased attenuation throughout the majority of the liver parenchyma consistent with hepatic steatosis. Higher attenuation within segment 7 likely reflects fatty sparing. No mass effect. The gallbladder is surgically absent. Pancreas: Unremarkable. No pancreatic ductal dilatation or surrounding inflammatory changes. Spleen: Normal in size without focal abnormality. Adrenals/Urinary Tract: 5 mm nonobstructing right renal calculus. There are numerous bilateral renal cortical cysts. The adrenals are unremarkable. Bladder is decompressed, which limits evaluation. Stomach/Bowel: No bowel obstruction or ileus. Diffuse diverticulosis of the distal colon without diverticulitis. No bowel wall thickening or inflammatory change. Vascular/Lymphatic: Extensive atherosclerosis of the abdominal aorta. There is aneurysmal dilatation of the right common iliac artery measuring up to 2.8 cm. No pathologic adenopathy. Reproductive: Prostate is unremarkable. Other: No free fluid or free gas. No abdominal wall hernia. Musculoskeletal: No acute or destructive bony lesions. Reconstructed images demonstrate no additional findings. IMPRESSION: 1. New rounded somewhat spiculated mass within the periphery of the right upper lobe, measuring 2.6 x 3.8 cm, concerning for malignancy. PET-CT may be useful for further evaluation. 2. Resolution of the previously seen cavitating pneumonia within the right lower lobe, with resulting scarring. 3. Hepatic steatosis, with fatty sparing in the posterior right lobe. 4. A 5 mm nonobstructing right renal calculus. 5. Aneurysmal dilatation of the  right common iliac artery measuring up to 2.8 cm. 6. Aortic Atherosclerosis (ICD10-I70.0) and Emphysema (ICD10-J43.9). Electronically Signed   By: Randa Ngo M.D.   On: 08/20/2019 23:28   IR US Guide Bx Asp/Drain  Result Date: 08/25/2019 INDICATION: Remote history of renal cell carcinoma, now with findings worrisome for metastatic lung cancer. Please perform ultrasound-guided biopsy indeterminate liver lesion for tissue diagnostic purposes. EXAM: ULTRASOUND GUIDED LIVER LESION BIOPSY COMPARISON:  Abdominal MRI-08/24/2019; CT of the chest, abdomen and pelvis-08/20/2019 MEDICATIONS: None ANESTHESIA/SEDATION: Fentanyl 100 mcg IV; Versed 2 mg IV Total Moderate Sedation time:  10 Minutes. The patient's level of consciousness and vital signs were monitored continuously by radiology nursing throughout the procedure under my direct supervision. COMPLICATIONS: None immediate. PROCEDURE: Informed written consent was obtained from the patient after a discussion of the risks, benefits and alternatives to treatment. The patient understands and consents the procedure. A timeout was performed prior to the initiation of the procedure. Ultrasound scanning was performed of the right upper abdominal quadrant demonstrates an approximately 1.6 x 1.6 cm mixed echogenic nodule within the subcapsular caudal aspect of the right lobe of the liver correlating with the lesion seen on abdominal MRI image 56, series 24. The procedure was planned. The right upper abdominal quadrant was prepped and draped in the usual sterile fashion. The overlying soft tissues were anesthetized with 1% lidocaine with epinephrine. A 17 gauge, 6.8 cm co-axial needle was advanced into a peripheral aspect of the lesion. This was followed by 5 core biopsies with an 18 gauge core  device under direct ultrasound guidance. The coaxial needle tract was embolized with a small amount of Gel-Foam slurry and superficial hemostasis was obtained with manual compression.  Post procedural scanning was negative for definitive area of hemorrhage or additional complication. A dressing was placed. The patient tolerated the procedure well without immediate post procedural complication. IMPRESSION: Technically successful ultrasound guided core needle biopsy of indeterminate lesion within the caudal aspect of the right lobe of the liver. Electronically Signed   By: Sandi Mariscal M.D.   On: 08/25/2019 12:01   DG CHEST PORT 1 VIEW  Result Date: 08/26/2019 CLINICAL DATA:  Shortness of breath. EXAM: PORTABLE CHEST 1 VIEW COMPARISON:  Chest x-ray dated August 24, 2019. FINDINGS: Stable cardiomediastinal silhouette with normal heart size. Normal pulmonary vascularity. Unchanged peripheral right upper lobe mass. No focal consolidation, pleural effusion, or pneumothorax. Emphysematous changes. No acute osseous abnormality. IMPRESSION: 1. No active disease. 2. Unchanged peripheral right upper lobe mass. Electronically Signed   By: Titus Dubin M.D.   On: 08/26/2019 09:11      The results of significant diagnostics from this hospitalization (including imaging, microbiology, ancillary and laboratory) are listed below for reference.     Microbiology: Recent Results (from the past 240 hour(s))  SARS Coronavirus 2 by RT PCR (hospital order, performed in [facility] hospital lab) Nasopharyngeal Nasopharyngeal Swab     Status: None   Collection Time: 08/20/19  5:42 PM   Specimen: Nasopharyngeal Swab  Result Value Ref Range Status   SARS Coronavirus 2 NEGATIVE NEGATIVE Final    Comment: (NOTE) SARS-CoV-2 target nucleic acids are NOT DETECTED.  The SARS-CoV-2 RNA is generally detectable in upper and lower respiratory specimens during the acute phase of infection. The lowest concentration of SARS-CoV-2 viral copies this assay can detect is 250 copies / mL. A negative result does not preclude SARS-CoV-2 infection and should not be used as the sole basis for treatment or other patient  management decisions.  A negative result may occur with improper specimen collection / handling, submission of specimen other than nasopharyngeal swab, presence of viral mutation(s) within the areas targeted by this assay, and inadequate number of viral copies (<250 copies / mL). A negative result must be combined with clinical observations, patient history, and epidemiological information.  Fact Sheet for Patients:   StrictlyIdeas.no  Fact Sheet for Healthcare Providers: BankingDealers.co.za  This test is not yet approved or  cleared by the [place] FDA and has been authorized for detection and/or diagnosis of SARS-CoV-2 by FDA under an Emergency Use Authorization (EUA).  This EUA will remain in effect (meaning this test can be used) for the duration of the COVID-19 declaration under Section 564(b)(1) of the Act, 21 U.S.C. section 360bbb-3(b)(1), unless the authorization is terminated or revoked sooner.  Performed at [facility], [place] [address] [place], [place] [address]   Blood Culture (routine x 2)     Status: None (Preliminary result)   Collection Time: 08/24/19  5:21 AM   Specimen: BLOOD  Result Value Ref Range Status   Specimen Description   Final    BLOOD BLOOD RIGHT FOREARM Performed at [place] [address], [place], [place] [address]    Special Requests   Final    BOTTLES DRAWN AEROBIC AND ANAEROBIC Blood Culture results may not be optimal due to an excessive volume of blood received in culture bottles Performed at [place] [address], [place], [place] [address]  Culture   Final    NO GROWTH 3 DAYS Performed at [facility], [place] [address] [place], [place] [address]    Report Status PENDING  Incomplete  Blood Culture (routine x 2)     Status: None (Preliminary result)   Collection Time: 08/24/19  5:33 AM   Specimen: BLOOD LEFT HAND   Result Value Ref Range Status   Specimen Description   Final    BLOOD LEFT HAND Performed at [place] [address] [place], [place] [address]    Special Requests   Final    BOTTLES DRAWN AEROBIC AND ANAEROBIC Blood Culture results may not be optimal due to an inadequate volume of blood received in culture bottles Performed at [place] [address] [place], [place] [address]    Culture   Final    NO GROWTH 3 DAYS Performed at [facility], [place] [address] [place], [place] [address]    Report Status PENDING  Incomplete  Urine culture     Status: Abnormal   Collection Time: 08/24/19  5:33 AM   Specimen: In/Out Cath Urine  Result Value Ref Range Status   Specimen Description   Final    IN/OUT CATH URINE Performed at [place] [address] [place], [place] [address]    Special Requests   Final    NONE Performed at [facility], [place] [address], [place], [place] [address]    Culture 100 COLONIES/mL STAPHYLOCOCCUS EPIDERMIDIS (A)  Final   Report Status 08/27/2019 FINAL  Final   Organism ID, Bacteria STAPHYLOCOCCUS EPIDERMIDIS (A)  Final      Susceptibility   Staphylococcus epidermidis - MIC*    CIPROFLOXACIN <=0.5 SENSITIVE Sensitive     GENTAMICIN <=0.5 SENSITIVE Sensitive     NITROFURANTOIN <=16 SENSITIVE Sensitive     OXACILLIN >=4 RESISTANT Resistant     TETRACYCLINE <=1 SENSITIVE Sensitive     VANCOMYCIN 1 SENSITIVE Sensitive     TRIMETH/SULFA 80 RESISTANT Resistant     CLINDAMYCIN >=8 RESISTANT Resistant     RIFAMPIN <=0.5 SENSITIVE Sensitive     Inducible Clindamycin NEGATIVE Sensitive     * 100 COLONIES/mL STAPHYLOCOCCUS EPIDERMIDIS  SARS Coronavirus 2 by RT PCR (hospital order, performed in [place] hospital lab) Nasopharyngeal Nasopharyngeal Swab     Status: None   Collection Time: 08/24/19  8:32 AM   Specimen: Nasopharyngeal Swab  Result Value Ref Range Status    SARS Coronavirus 2 NEGATIVE NEGATIVE Final    Comment: (NOTE) SARS-CoV-2 target nucleic acids are NOT DETECTED.  The SARS-CoV-2 RNA is generally detectable in upper and lower respiratory specimens during the acute phase of infection. The lowest concentration of SARS-CoV-2 viral copies this assay can detect is 250 copies / mL. A negative result does not preclude SARS-CoV-2 infection and should not be used as the sole basis for treatment or other patient management decisions.  A negative result may occur with improper specimen collection / handling, submission of specimen other than nasopharyngeal swab, presence of viral mutation(s) within the areas targeted by this assay, and inadequate number of viral copies (<250 copies / mL). A negative result must be combined with clinical observations, patient history, and epidemiological information.  Fact Sheet for Patients:   StrictlyIdeas.no  Fact Sheet for Healthcare Providers: BankingDealers.co.za  This test is not yet approved or  cleared by the [place] FDA and has been authorized for detection and/or diagnosis of  SARS-CoV-2 by FDA under an Emergency Use Authorization (EUA).  This EUA will remain in effect (meaning this test can be used) for the duration of the COVID-19 declaration under Section 564(b)(1) of the Act, 21 U.S.C. section 360bbb-3(b)(1), unless the authorization is terminated or revoked sooner.  Performed at [facility], [place] [address], [place], [place] [address]   MRSA PCR Screening     Status: None   Collection Time: 08/24/19 11:31 PM   Specimen: Nasopharyngeal  Result Value Ref Range Status   MRSA by PCR NEGATIVE NEGATIVE Final    Comment:        The GeneXpert MRSA Assay (FDA approved for NASAL specimens only), is one component of a comprehensive MRSA colonization surveillance program. It is not intended to diagnose MRSA infection nor to  guide or monitor treatment for MRSA infections. Performed at [facility], [place] [address] [place], [place] [address]      Labs: BNP (last 3 results) Recent Labs    09/16/18 2144  BNP 361.4*   Basic Metabolic Panel: Recent Labs  Lab 08/20/19 1600 08/24/19 0522 08/25/19 0317 08/26/19 0310 08/27/19 0306  NA 135 133* 134* 133* 133*  K 4.7 3.7 4.4 4.4 3.9  CL 97* 97* 102 101 98  CO2 26 23 25 27 27   GLUCOSE 106* 141* 167* 164* 133*  BUN 32* 27* 17 19 21   CREATININE 1.11 0.96 0.51* 0.68 0.57*  CALCIUM 9.6 8.9 8.5* 8.7* 8.7*  MG  --   --  1.8 1.9 2.1  PHOS  --   --   --  2.3*  --    Liver Function Tests: Recent Labs  Lab 08/20/19 1600 08/24/19 0522 08/26/19 0310  AST 21 19 17   ALT 12 12 13   ALKPHOS 64 61 60  BILITOT 1.0 0.8 0.6  PROT 8.0 7.1 6.3*  ALBUMIN 3.9 3.4* 2.9*   No results for input(s): LIPASE, AMYLASE in the last 168 hours. No results for input(s): AMMONIA in the last 168 hours. CBC: Recent Labs  Lab 08/20/19 1600 08/24/19 0522 08/25/19 0317 08/26/19 0310 08/27/19 0306  WBC 14.2* 13.2* 6.2 13.1* 12.2*  NEUTROABS 11.7* 10.9*  --  11.5*  --   HGB 13.0 12.2* 11.2* 11.6* 11.9*  HCT 41.2 38.8* 35.0* 36.1* 35.9*  MCV 97.4 97.2 97.2 96.5 93.7  PLT 474* 460* 412* 474* 498*   Cardiac Enzymes: No results for input(s): CKTOTAL, CKMB, CKMBINDEX, TROPONINI in the last 168 hours. BNP: Invalid input(s): POCBNP CBG: No results for input(s): GLUCAP in the last 168 hours. D-Dimer No results for input(s): DDIMER in the last 72 hours. Hgb A1c No results for input(s): HGBA1C in the last 72 hours. Lipid Profile No results for input(s): CHOL, HDL, LDLCALC, TRIG, CHOLHDL, LDLDIRECT in the last 72 hours. Thyroid function studies No results for input(s): TSH, T4TOTAL, T3FREE, THYROIDAB in the last 72 hours.  Invalid input(s): FREET3 Anemia work up No results for input(s): VITAMINB12, FOLATE, FERRITIN, TIBC, IRON, RETICCTPCT in the last 72  hours. Urinalysis    Component Value Date/Time   COLORURINE YELLOW 08/24/2019 0522   APPEARANCEUR CLEAR 08/24/2019 0522   LABSPEC 1.016 08/24/2019 0522   PHURINE 5.0 08/24/2019 0522   GLUCOSEU NEGATIVE 08/24/2019 0522   HGBUR SMALL (A) 08/24/2019 0522   BILIRUBINUR NEGATIVE 08/24/2019 0522   KETONESUR NEGATIVE 08/24/2019 0522   PROTEINUR NEGATIVE 08/24/2019 0522   UROBILINOGEN 1.0 05/23/2011 0953   NITRITE NEGATIVE 08/24/2019 0522   LEUKOCYTESUR NEGATIVE 08/24/2019 0522  Sepsis Labs Invalid input(s): PROCALCITONIN,  WBC,  LACTICIDVEN Microbiology Recent Results (from the past 240 hour(s))  SARS Coronavirus 2 by RT PCR (hospital order, performed in [facility] hospital lab) Nasopharyngeal Nasopharyngeal Swab     Status: None   Collection Time: 08/20/19  5:42 PM   Specimen: Nasopharyngeal Swab  Result Value Ref Range Status   SARS Coronavirus 2 NEGATIVE NEGATIVE Final    Comment: (NOTE) SARS-CoV-2 target nucleic acids are NOT DETECTED.  The SARS-CoV-2 RNA is generally detectable in upper and lower respiratory specimens during the acute phase of infection. The lowest concentration of SARS-CoV-2 viral copies this assay can detect is 250 copies / mL. A negative result does not preclude SARS-CoV-2 infection and should not be used as the sole basis for treatment or other patient management decisions.  A negative result may occur with improper specimen collection / handling, submission of specimen other than nasopharyngeal swab, presence of viral mutation(s) within the areas targeted by this assay, and inadequate number of viral copies (<250 copies / mL). A negative result must be combined with clinical observations, patient history, and epidemiological information.  Fact Sheet for Patients:   StrictlyIdeas.no  Fact Sheet for Healthcare Providers: BankingDealers.co.za  This test is not yet approved or  cleared by the [place]  FDA and has been authorized for detection and/or diagnosis of SARS-CoV-2 by FDA under an Emergency Use Authorization (EUA).  This EUA will remain in effect (meaning this test can be used) for the duration of the COVID-19 declaration under Section 564(b)(1) of the Act, 21 U.S.C. section 360bbb-3(b)(1), unless the authorization is terminated or revoked sooner.  Performed at [facility], [place] [address] [place], [place] [address]   Blood Culture (routine x 2)     Status: None (Preliminary result)   Collection Time: 08/24/19  5:21 AM   Specimen: BLOOD  Result Value Ref Range Status   Specimen Description   Final    BLOOD BLOOD RIGHT FOREARM Performed at [place] [address] [place], [place] [address]    Special Requests   Final    BOTTLES DRAWN AEROBIC AND ANAEROBIC Blood Culture results may not be optimal due to an excessive volume of blood received in culture bottles Performed at [place] [address] [place], [place] [address]    Culture   Final    NO GROWTH 3 DAYS Performed at [facility], [place] [address] [place], [place] [address]    Report Status PENDING  Incomplete  Blood Culture (routine x 2)     Status: None (Preliminary result)   Collection Time: 08/24/19  5:33 AM   Specimen: BLOOD LEFT HAND  Result Value Ref Range Status   Specimen Description   Final    BLOOD LEFT HAND Performed at [place] [address], [place], [place] [address]    Special Requests   Final    BOTTLES DRAWN AEROBIC AND ANAEROBIC Blood Culture results may not be optimal due to an inadequate volume of blood received in culture bottles Performed at [place] [address] [place], [place] [address]    Culture   Final    NO GROWTH 3 DAYS Performed at [facility], [place] [address], [place],  [address]    Report Status PENDING  Incomplete  Urine culture      Status: Abnormal   Collection Time: 08/24/19  5:33 AM   Specimen: In/Out  Cath Urine  Result Value Ref Range Status   Specimen Description   Final    IN/OUT CATH URINE Performed at [place] [address], [place], [place] [address]    Special Requests   Final    NONE Performed at [facility], [place] [address], [place], [place] [address]    Culture 100 COLONIES/mL STAPHYLOCOCCUS EPIDERMIDIS (A)  Final   Report Status 08/27/2019 FINAL  Final   Organism ID, Bacteria STAPHYLOCOCCUS EPIDERMIDIS (A)  Final      Susceptibility   Staphylococcus epidermidis - MIC*    CIPROFLOXACIN <=0.5 SENSITIVE Sensitive     GENTAMICIN <=0.5 SENSITIVE Sensitive     NITROFURANTOIN <=16 SENSITIVE Sensitive     OXACILLIN >=4 RESISTANT Resistant     TETRACYCLINE <=1 SENSITIVE Sensitive     VANCOMYCIN 1 SENSITIVE Sensitive     TRIMETH/SULFA 80 RESISTANT Resistant     CLINDAMYCIN >=8 RESISTANT Resistant     RIFAMPIN <=0.5 SENSITIVE Sensitive     Inducible Clindamycin NEGATIVE Sensitive     * 100 COLONIES/mL STAPHYLOCOCCUS EPIDERMIDIS  SARS Coronavirus 2 by RT PCR (hospital order, performed in [place] hospital lab) Nasopharyngeal Nasopharyngeal Swab     Status: None   Collection Time: 08/24/19  8:32 AM   Specimen: Nasopharyngeal Swab  Result Value Ref Range Status   SARS Coronavirus 2 NEGATIVE NEGATIVE Final    Comment: (NOTE) SARS-CoV-2 target nucleic acids are NOT DETECTED.  The SARS-CoV-2 RNA is generally detectable in upper and lower respiratory specimens during the acute phase of infection. The lowest concentration of SARS-CoV-2 viral copies this assay can detect is 250 copies / mL. A negative result does not preclude SARS-CoV-2 infection and should not be used as the sole basis for treatment or other patient management decisions.  A negative result may occur with improper specimen collection / handling, submission of specimen other than nasopharyngeal  swab, presence of viral mutation(s) within the areas targeted by this assay, and inadequate number of viral copies (<250 copies / mL). A negative result must be combined with clinical observations, patient history, and epidemiological information.  Fact Sheet for Patients:   StrictlyIdeas.no  Fact Sheet for Healthcare Providers: BankingDealers.co.za  This test is not yet approved or  cleared by the [place] FDA and has been authorized for detection and/or diagnosis of SARS-CoV-2 by FDA under an Emergency Use Authorization (EUA).  This EUA will remain in effect (meaning this test can be used) for the duration of the COVID-19 declaration under Section 564(b)(1) of the Act, 21 U.S.C. section 360bbb-3(b)(1), unless the authorization is terminated or revoked sooner.  Performed at [facility], [place] [address], [place], [place] [address]   MRSA PCR Screening     Status: None   Collection Time: 08/24/19 11:31 PM   Specimen: Nasopharyngeal  Result Value Ref Range Status   MRSA by PCR NEGATIVE NEGATIVE Final    Comment:        The GeneXpert MRSA Assay (FDA approved for NASAL specimens only), is one component of a comprehensive MRSA colonization surveillance program. It is not intended to diagnose MRSA infection nor to guide or monitor treatment for MRSA infections. Performed at [facility], [place] [address], [place], [place] [address]      Time coordinating discharge:  I have spent 35 minutes face to face with the patient and on the ward discussing the patients care, assessment, plan and disposition with other care givers. >50% of  the time was devoted counseling the patient about the risks and benefits of treatment/Discharge disposition and coordinating care.   SIGNED:   Damita Lack, MD  [facility] 08/27/2019, 11:24 AM   If 7PM-7AM, please contact night-coverage

## 2019-08-27 NOTE — Care Management Important Message (Signed)
Important Message  Patient Details IM Letter given to [place] Case Manager to present to the Patient Name: [name] MRN: [id number] Date of Birth: 30-May-1932   Medicare Important Message Given:  Yes     Kerin Salen 08/27/2019, 10:40 AM

## 2019-08-27 NOTE — Progress Notes (Signed)
Patient ID: Ryan Coffey, male   DOB: 1933-01-17, 86 y.o.   MRN: [id number]  This NP visited patient at the bedside as a follow up for palliative medicine and emotional support.  Introduced myself as a provider with the palliative medicine team.  [name] was seen last week by my colleague Vinie Sill and she recommended ongoing follow-up in the outpatient radiation oncology clinic.  I met with daughter at [employer].  Continue conversation regarding patient's current medical situation.  Plan is to likely discharge to SNF for ongoing rehabilitation in the next 24 to 48 hours and continue with outpatient radiation oncology treatment.  Encouraged family to contact palliative medicine if they were interested in further palliative medicine support in the outpatient setting.  Introduced the MOST form and left a hard choices booklet for review.  Discussed with family the importance of continued conversation with each other  and their  medical providers regarding overall plan of care and treatment options,  ensuring decisions are within the context of the patients values and GOCs.  Questions and concerns addressed     No charge  Wadie Lessen NP  Palliative Medicine Team Team Phone # [phone] Pager [phone]

## 2019-08-27 NOTE — Evaluation (Signed)
Occupational Therapy Evaluation Patient Details Name: Ryan Coffey MRN: [id number] DOB: 1932-11-06 Today's Date: 08/27/2019    History of Present Illness 86 year old male with history of seropositive rheumatoid arthritis, renal cell cancer , hyperlipidemia, pituitary tumor, Right lower lobe cavitary pneumonia in July 2020 admitted 08/24/19 with Generalized weakness, fevers. Recentlyin  ED visit for slurred speech, vision changes with findings on imaging concerning for new right upper lobe mass with metastatic lesions to brain. head, chest ,abdomen ,pelvis   Clinical Impression   Ryan Coffey is an 86 year old man who presents to the hospital with family secondary to weakness and found to have upper lobe mass with mets to the brain. On evaluation patient's family reports patient having difficulty with vision and cognition - not being able to read the paper as he typically does, not able to use email or his smart phone and not being able to use the bed remote in his room. Patient presents with reports of generalized weakness but without focal neurological deficits in upper or lower extremities. Patient demonstrates ability to perform functional mobility with min guard for safety and ADLs with min assist. Therapist performed visual assessment and patient appeared to have grossly functional visual tracking and peripheral vision, ability to locate and find letters in a newspaper with normal search pattern. Patient did have decreased acuity and could not read small print. Daughter reports patient stating that he has to really focus to read and interpret the words. Patient will benefit from skilled OT services to improve deficits and independence in order to return home. Patient does not have 24/7 supervision at home and would benefit from rehab at discharge to maximize potential before returning home.     Follow Up Recommendations  SNF    Equipment Recommendations  Recommend Cognitive Evaluation  at [facility]    Recommendations for Other Services       Precautions / Restrictions Precautions Precautions: Fall Restrictions Weight Bearing Restrictions: No      Mobility Bed Mobility   Bed Mobility: Supine to Sit     Supine to sit: Supervision;HOB elevated     General bed mobility comments: no physical assistance required  Transfers     Transfers: Sit to/from Stand Sit to Stand: Min guard         General transfer comment: Min guard for ambulation in room with shoes on. No overt loss of balance but ambulation limited. Able to stand at sink without difficulty and ambulate around the bed. Patient using one hand hold to steady himself as needed.    Balance           Standing balance support: Single extremity supported Standing balance-Leahy Scale: Good Standing balance comment: Patient able to correct againstt three anterior nudges by therapist                           ADL either performed or assessed with clinical judgement   ADL Overall ADL's : Needs assistance/impaired Eating/Feeding: Independent;Sitting   Grooming: Set up;Wash/dry hands;Standing Grooming Details (indicate cue type and reason): Patient washed hands at sink. Upper Body Bathing: Set up;Sitting   Lower Body Bathing: Minimal assistance;Sit to/from stand   Upper Body Dressing : Set up;Minimal assistance   Lower Body Dressing: Minimal assistance;Sit to/from stand Lower Body Dressing Details (indicate cue type and reason): min assist to doff and donn socks Toilet Transfer: Min guard;Ambulation   Toileting- Clothing Manipulation and Hygiene: Minimal assistance;Sit to/from  Nurse, children's Details (indicate cue type and reason): n/a Functional mobility during ADLs: Min guard General ADL Comments: min guard to ambulate     Vision Baseline Vision/History: Wears glasses Wears Glasses: At all times Patient Visual Report: Other (comment) (decreased acuity.  Daughter reports patient unable to use bed remote and phones due to vision and cognitive difficulties now) Vision Assessment?: Yes Eye Alignment: Within Functional Limits Ocular Range of Motion: Within Functional Limits Alignment/Gaze Preference: Within Defined Limits Tracking/Visual Pursuits: Other (comment) (grossly able to visually track, with some lapses in left eye back to midline.) Convergence: Impaired - to be further tested in functional context Visual Fields: No apparent deficits Additional Comments: Able to read larger font on newspaper, read the clock on wall, tap multiple changing targets, locate and circle letters in newspaper. Daughter reports patient hasn't been able to use cellphone and call bell - though no overt visual deficits beyond acuity presented themselves. Did not run into objects in room.     Perception     Praxis      Pertinent Vitals/Pain Pain Assessment: No/denies pain     Hand Dominance Right   Extremity/Trunk Assessment Upper Extremity Assessment LUE Deficits / Details: severe ulnar drift of digits limiting functional grasp, impaired ROM of DIPs, PIPs and MCPs   Lower Extremity Assessment RLE Deficits / Details: Functional ROM of right shoulder but less than LUE due to arthritis, Normal strength.       Communication Communication Communication: No difficulties;HOH   Cognition Arousal/Alertness: Awake/alert Behavior During Therapy: WFL for tasks assessed/performed Overall Cognitive Status: Impaired/Different from baseline                                 General Comments: Appears basic intact, daughter in background shaking head, mouthing that patient is not correct about events leading to coming to Hospital.   General Comments       Exercises     Shoulder Instructions      Home Living Family/patient expects to be discharged to:: Private residence Living Arrangements: Alone Available Help at Discharge: Available  PRN/intermittently Type of Home: House Home Access: Stairs to enter [employer] of Steps: 2-3 Entrance Stairs-Rails: None Home Layout: Two level;Full bath on main level;Able to live on main level with bedroom/bathroom     Bathroom Shower/Tub: Occupational psychologist: Handicapped height Bathroom Accessibility: Yes   Home Equipment: Clinical cytogeneticist - 2 wheels   Additional Comments: family really cannot be availble 24/7      Prior Functioning/Environment Level of Independence: Independent        Comments: was driving recently        OT Problem List: Decreased activity tolerance;Impaired balance (sitting and/or standing);Decreased safety awareness;Decreased cognition;Pain;Impaired vision/perception      OT Treatment/Interventions: Self-care/ADL training;Therapeutic exercise;Neuromuscular education;DME and/or AE instruction;Cognitive remediation/compensation;Therapeutic activities;Balance training;Patient/family education;Visual/perceptual remediation/compensation    OT Goals(Current goals can be found in the care plan section) Acute Rehab OT Goals Patient Stated Goal: to improve independence and strength in order to return home OT Goal Formulation: With patient/family Time For Goal Achievement: 09/10/19 Potential to Achieve Goals: Good  OT Frequency: Min 2X/week   Barriers to D/C: Decreased caregiver support          Co-evaluation              AM-PAC OT "6 Clicks" Daily Activity     Outcome Measure Help  from another person eating meals?: None Help from another person taking care of personal grooming?: A Little Help from another person toileting, which includes using toliet, bedpan, or urinal?: A Little Help from another person bathing (including washing, rinsing, drying)?: A Little Help from another person to put on and taking off regular upper body clothing?: A Little Help from another person to put on and taking off regular lower body  clothing?: A Little 6 Click Score: 19   End of Session Equipment Utilized During Treatment: Gait belt Nurse Communication: Mobility status  Activity Tolerance: Patient tolerated treatment well Patient left: in chair;with call bell/phone within reach;with family/visitor present  OT Visit Diagnosis: Unsteadiness on feet (R26.81);Muscle weakness (generalized) (M62.81)                Time: 7014-1030 OT Time Calculation (min): 33 min Charges:  OT General Charges $OT Visit: 1 Visit OT Evaluation $OT Eval Moderate Complexity: 1 Mod  Brianna Bennett, OTR/L [place]  Office [phone] Pager: [place] 08/27/2019, 1:05 PM

## 2019-08-28 ENCOUNTER — Ambulatory Visit
Admit: 2019-08-28 | Discharge: 2019-08-28 | Disposition: A | Discharge status: Home / Self Care | Payer: Medicare Other | Attending: Radiation Oncology | Admitting: Radiation Oncology

## 2019-08-28 ENCOUNTER — Ambulatory Visit: Payer: Medicare Other | Admitting: Internal Medicine

## 2019-08-28 ENCOUNTER — Telehealth: Payer: Self-pay | Admitting: *Deleted

## 2019-08-28 LAB — CBC
HCT: 37.2 % — ABNORMAL LOW (ref 39.0–52.0)
Hemoglobin: 12.3 g/dL — ABNORMAL LOW (ref 13.0–17.0)
MCH: 31.3 pg (ref 26.0–34.0)
MCHC: 33.1 g/dL (ref 30.0–36.0)
MCV: 94.7 fL (ref 80.0–100.0)
Platelets: 467 K/uL — ABNORMAL HIGH (ref 150–400)
RBC: 3.93 MIL/uL — ABNORMAL LOW (ref 4.22–5.81)
RDW: 14.6 % (ref 11.5–15.5)
WBC: 11.4 K/uL — ABNORMAL HIGH (ref 4.0–10.5)
nRBC: 0 % (ref 0.0–0.2)

## 2019-08-28 LAB — BASIC METABOLIC PANEL WITH GFR
Anion gap: 7 (ref 5–15)
BUN: 27 mg/dL — ABNORMAL HIGH (ref 8–23)
CO2: 30 mmol/L (ref 22–32)
Calcium: 9.2 mg/dL (ref 8.9–10.3)
Chloride: 96 mmol/L — ABNORMAL LOW (ref 98–111)
Creatinine, Ser: 0.69 mg/dL (ref 0.61–1.24)
GFR calc Af Amer: >60 mL/min
GFR calc non Af Amer: >60 mL/min
Glucose, Bld: 136 mg/dL — ABNORMAL HIGH (ref 70–99)
Potassium: 4.2 mmol/L (ref 3.5–5.1)
Sodium: 133 mmol/L — ABNORMAL LOW (ref 135–145)

## 2019-08-28 LAB — SARS CORONAVIRUS 2 (TAT 6-24 HRS): SARS Coronavirus 2: NEGATIVE

## 2019-08-28 LAB — MAGNESIUM: Magnesium: 2.1 mg/dL (ref 1.7–2.4)

## 2019-08-28 MED ORDER — OXYCODONE HCL 5 MG PO TABS: 5.0000 mg | ORAL_TABLET | Freq: Four times a day (QID) | ORAL | 0 refills | Status: AC | PRN

## 2019-08-28 NOTE — TOC Transition Note (Signed)
Transition of Care [facility]) - CM/SW Discharge Note   Patient Details  Name: Ryan Coffey MRN: [id number] Date of Birth: [birth year]  [facility] CM/SW Contact:  Lennart Pall, LCSW Phone Number: 08/28/2019, 10:43 AM   Clinical Narrative:   Pt cleared for d/c today to [facility].  Daughter in room and we have discussed plan that family will transport pt to Monday appointments for PET scan and daily rad tx.  Facility will provide transport for remaining rad txs.  Ready for d/c.  No further TOC needs.    Final next level of care: Skilled Nursing Facility Barriers to Discharge: Barriers Resolved   Patient Goals and CMS Choice Patient states their goals for this hospitalization and ongoing recovery are:: pt states his goal is to eventually be able to return home CMS Medicare.gov Compare Post Acute Care list provided to:: Patient Choice offered to / list presented to : Patient, Adult Children  Discharge Placement              Patient chooses bed at: [facility] Patient to be transferred to facility by: [place] Name of family member notified: daughter, Manuela Schwartz Patient and family notified of of transfer: 08/28/19  Discharge Plan and Services In-house Referral: Hospice / [place], Clinical Social Work   Post Acute Care Choice: [place]                               Social Determinants of Health (SDOH) Interventions     Readmission Risk Interventions Readmission Risk Prevention Plan 08/26/2019 09/25/2018  Transportation Screening Complete Complete  PCP or Specialist Appt within 5-7 Days Complete Complete  Home Care Screening Complete Complete  Medication Review (RN CM) Complete Complete  Some recent data might be hidden

## 2019-08-28 NOTE — Telephone Encounter (Signed)
Per [name], I notified pathology dept to send recent tissue for [facility] and PDL 1 testing.

## 2019-08-28 NOTE — Progress Notes (Signed)
Seen and examined at bedside this morning, daughter at bedside. No acute events overnight. Tolerated his radiation yesterday. Vital signs remained stable. Discussed prelim results showing lung malignancy. This will be followed up outpatient by oncology. Arrangements for PET scan on Monday. PT recommended SNF therefore patient will be transition to SNF. Rest of the plan as outlined previously.  All the questions have been answered by me.  Time spent-10 minutes  Gerlean Ren MD [facility]

## 2019-08-29 LAB — CULTURE, BLOOD (ROUTINE X 2)
Culture: NO GROWTH
Culture: NO GROWTH

## 2019-08-31 ENCOUNTER — Telehealth: Payer: Self-pay | Admitting: Internal Medicine

## 2019-08-31 ENCOUNTER — Ambulatory Visit
Admission: RE | Admit: 2019-08-31 | Discharge: 2019-08-31 | Disposition: A | Discharge status: Home / Self Care | Payer: Medicare Other | Source: Ambulatory Visit | Attending: Radiation Oncology | Admitting: Radiation Oncology

## 2019-08-31 ENCOUNTER — Encounter (HOSPITAL_COMMUNITY)
Admission: RE | Admit: 2019-08-31 | Discharge: 2019-08-31 | Disposition: A | Discharge status: Home / Self Care | Payer: Medicare Other | Source: Ambulatory Visit | Attending: Pulmonary Disease | Admitting: Pulmonary Disease

## 2019-08-31 ENCOUNTER — Other Ambulatory Visit: Payer: Self-pay

## 2019-08-31 ENCOUNTER — Telehealth: Payer: Self-pay | Admitting: Radiation Oncology

## 2019-08-31 DIAGNOSIS — R911 Solitary pulmonary nodule: Secondary | ICD-10-CM | POA: Insufficient documentation

## 2019-08-31 DIAGNOSIS — E785 Hyperlipidemia, unspecified: Secondary | ICD-10-CM | POA: Diagnosis not present

## 2019-08-31 DIAGNOSIS — E230 Hypopituitarism: Secondary | ICD-10-CM | POA: Diagnosis not present

## 2019-08-31 DIAGNOSIS — J9601 Acute respiratory failure with hypoxia: Secondary | ICD-10-CM | POA: Diagnosis not present

## 2019-08-31 DIAGNOSIS — Z510 Encounter for antineoplastic radiation therapy: Secondary | ICD-10-CM | POA: Diagnosis not present

## 2019-08-31 DIAGNOSIS — R1310 Dysphagia, unspecified: Secondary | ICD-10-CM | POA: Diagnosis not present

## 2019-08-31 DIAGNOSIS — J449 Chronic obstructive pulmonary disease, unspecified: Secondary | ICD-10-CM | POA: Diagnosis not present

## 2019-08-31 DIAGNOSIS — C3411 Malignant neoplasm of upper lobe, right bronchus or lung: Secondary | ICD-10-CM | POA: Diagnosis not present

## 2019-08-31 DIAGNOSIS — E46 Unspecified protein-calorie malnutrition: Secondary | ICD-10-CM | POA: Diagnosis not present

## 2019-08-31 DIAGNOSIS — R509 Fever, unspecified: Secondary | ICD-10-CM | POA: Diagnosis not present

## 2019-08-31 DIAGNOSIS — C7931 Secondary malignant neoplasm of brain: Secondary | ICD-10-CM | POA: Diagnosis not present

## 2019-08-31 LAB — GLUCOSE, CAPILLARY: Glucose-Capillary: 124 mg/dL — ABNORMAL HIGH (ref 70–99)

## 2019-08-31 MED ORDER — FLUDEOXYGLUCOSE F - 18 (FDG) INJECTION
6.7600 | Freq: Once | INTRAVENOUS | Status: AC | PRN
  Administered 2019-08-31: 6.76 via INTRAVENOUS

## 2019-08-31 NOTE — Telephone Encounter (Signed)
Appts have been scheduled for Ryan Coffey to see [name] on 7/6 at 1:45pm w/labs at 1:15pm and to see [name] at 2:30pm on the same day. Appts have been given to the pt's dtr Ryan Coffey. Aware to arrive 15 minutes early.

## 2019-08-31 NOTE — Telephone Encounter (Signed)
The patient's daughter Colletta [place] called and we discussed his pathology and PET plans.

## 2019-09-01 ENCOUNTER — Ambulatory Visit
Admission: RE | Admit: 2019-09-01 | Discharge: 2019-09-01 | Disposition: A | Discharge status: Home / Self Care | Payer: Medicare Other | Source: Ambulatory Visit | Attending: Radiation Oncology | Admitting: Radiation Oncology

## 2019-09-01 DIAGNOSIS — C3411 Malignant neoplasm of upper lobe, right bronchus or lung: Secondary | ICD-10-CM | POA: Diagnosis not present

## 2019-09-01 DIAGNOSIS — C7931 Secondary malignant neoplasm of brain: Secondary | ICD-10-CM | POA: Diagnosis not present

## 2019-09-01 DIAGNOSIS — Z510 Encounter for antineoplastic radiation therapy: Secondary | ICD-10-CM | POA: Diagnosis not present

## 2019-09-02 ENCOUNTER — Ambulatory Visit
Admission: RE | Admit: 2019-09-02 | Discharge: 2019-09-02 | Disposition: A | Discharge status: Home / Self Care | Payer: Medicare Other | Source: Ambulatory Visit | Attending: Radiation Oncology | Admitting: Radiation Oncology

## 2019-09-02 ENCOUNTER — Other Ambulatory Visit: Payer: Self-pay

## 2019-09-02 DIAGNOSIS — C7931 Secondary malignant neoplasm of brain: Secondary | ICD-10-CM | POA: Diagnosis not present

## 2019-09-02 DIAGNOSIS — Z510 Encounter for antineoplastic radiation therapy: Secondary | ICD-10-CM | POA: Diagnosis not present

## 2019-09-02 DIAGNOSIS — C3411 Malignant neoplasm of upper lobe, right bronchus or lung: Secondary | ICD-10-CM | POA: Diagnosis not present

## 2019-09-03 ENCOUNTER — Other Ambulatory Visit: Payer: Self-pay

## 2019-09-03 ENCOUNTER — Ambulatory Visit
Admission: RE | Admit: 2019-09-03 | Discharge: 2019-09-03 | Disposition: A | Discharge status: Home / Self Care | Payer: Medicare Other | Source: Ambulatory Visit | Attending: Radiation Oncology | Admitting: Radiation Oncology

## 2019-09-03 ENCOUNTER — Other Ambulatory Visit: Payer: Self-pay | Admitting: Rheumatology

## 2019-09-03 DIAGNOSIS — R2681 Unsteadiness on feet: Secondary | ICD-10-CM | POA: Diagnosis not present

## 2019-09-03 DIAGNOSIS — C3411 Malignant neoplasm of upper lobe, right bronchus or lung: Secondary | ICD-10-CM | POA: Insufficient documentation

## 2019-09-03 DIAGNOSIS — J9601 Acute respiratory failure with hypoxia: Secondary | ICD-10-CM | POA: Diagnosis not present

## 2019-09-03 DIAGNOSIS — Z87891 Personal history of nicotine dependence: Secondary | ICD-10-CM | POA: Diagnosis not present

## 2019-09-03 DIAGNOSIS — E230 Hypopituitarism: Secondary | ICD-10-CM | POA: Diagnosis not present

## 2019-09-03 DIAGNOSIS — C7931 Secondary malignant neoplasm of brain: Secondary | ICD-10-CM | POA: Insufficient documentation

## 2019-09-03 DIAGNOSIS — E785 Hyperlipidemia, unspecified: Secondary | ICD-10-CM | POA: Diagnosis not present

## 2019-09-03 DIAGNOSIS — F5104 Psychophysiologic insomnia: Secondary | ICD-10-CM | POA: Diagnosis not present

## 2019-09-03 DIAGNOSIS — K769 Liver disease, unspecified: Secondary | ICD-10-CM | POA: Diagnosis not present

## 2019-09-03 DIAGNOSIS — R634 Abnormal weight loss: Secondary | ICD-10-CM | POA: Diagnosis not present

## 2019-09-03 DIAGNOSIS — D0220 Carcinoma in situ of unspecified bronchus and lung: Secondary | ICD-10-CM | POA: Diagnosis not present

## 2019-09-03 DIAGNOSIS — G893 Neoplasm related pain (acute) (chronic): Secondary | ICD-10-CM | POA: Diagnosis not present

## 2019-09-03 DIAGNOSIS — R278 Other lack of coordination: Secondary | ICD-10-CM | POA: Diagnosis not present

## 2019-09-03 DIAGNOSIS — E291 Testicular hypofunction: Secondary | ICD-10-CM | POA: Diagnosis not present

## 2019-09-03 DIAGNOSIS — Z5112 Encounter for antineoplastic immunotherapy: Secondary | ICD-10-CM | POA: Diagnosis not present

## 2019-09-03 DIAGNOSIS — Z510 Encounter for antineoplastic radiation therapy: Secondary | ICD-10-CM | POA: Insufficient documentation

## 2019-09-03 DIAGNOSIS — C7951 Secondary malignant neoplasm of bone: Secondary | ICD-10-CM | POA: Diagnosis not present

## 2019-09-03 DIAGNOSIS — R1312 Dysphagia, oropharyngeal phase: Secondary | ICD-10-CM | POA: Diagnosis not present

## 2019-09-03 DIAGNOSIS — M6281 Muscle weakness (generalized): Secondary | ICD-10-CM | POA: Diagnosis not present

## 2019-09-03 DIAGNOSIS — Z5111 Encounter for antineoplastic chemotherapy: Secondary | ICD-10-CM | POA: Diagnosis not present

## 2019-09-03 DIAGNOSIS — R509 Fever, unspecified: Secondary | ICD-10-CM | POA: Diagnosis not present

## 2019-09-03 DIAGNOSIS — J449 Chronic obstructive pulmonary disease, unspecified: Secondary | ICD-10-CM | POA: Diagnosis not present

## 2019-09-03 DIAGNOSIS — K219 Gastro-esophageal reflux disease without esophagitis: Secondary | ICD-10-CM | POA: Diagnosis not present

## 2019-09-03 DIAGNOSIS — R41841 Cognitive communication deficit: Secondary | ICD-10-CM | POA: Diagnosis not present

## 2019-09-03 DIAGNOSIS — R1313 Dysphagia, pharyngeal phase: Secondary | ICD-10-CM | POA: Diagnosis not present

## 2019-09-03 DIAGNOSIS — M059 Rheumatoid arthritis with rheumatoid factor, unspecified: Secondary | ICD-10-CM | POA: Diagnosis not present

## 2019-09-03 DIAGNOSIS — D473 Essential (hemorrhagic) thrombocythemia: Secondary | ICD-10-CM | POA: Diagnosis not present

## 2019-09-03 DIAGNOSIS — D649 Anemia, unspecified: Secondary | ICD-10-CM | POA: Diagnosis not present

## 2019-09-03 DIAGNOSIS — Z85528 Personal history of other malignant neoplasm of kidney: Secondary | ICD-10-CM | POA: Diagnosis not present

## 2019-09-03 DIAGNOSIS — Z7189 Other specified counseling: Secondary | ICD-10-CM | POA: Diagnosis not present

## 2019-09-03 DIAGNOSIS — M069 Rheumatoid arthritis, unspecified: Secondary | ICD-10-CM | POA: Diagnosis not present

## 2019-09-03 DIAGNOSIS — C787 Secondary malignant neoplasm of liver and intrahepatic bile duct: Secondary | ICD-10-CM | POA: Diagnosis not present

## 2019-09-03 DIAGNOSIS — E039 Hypothyroidism, unspecified: Secondary | ICD-10-CM | POA: Diagnosis not present

## 2019-09-03 DIAGNOSIS — D849 Immunodeficiency, unspecified: Secondary | ICD-10-CM | POA: Diagnosis not present

## 2019-09-03 NOTE — Telephone Encounter (Signed)
Last Visit: 05/26/2019 Next Visit: 10/28/2019 Labs: 08/26/2019 Sodium 133, Chloride 96, Glucose 136 BUN 27, WBC 11.4, RBC 3.93, Hgb 12.3, HCT 37.2 Platelets 467  Patient was recently in the hospital. Found to have new spiculated mass in the right upper lobe with metastatic brain disease and liver lesions on MRI abdomen. started on whole brain radiation in the hospital.  Total 10 treatments have been planned, last day 7/7.  DX: Seropositive rheumatoid arthritis   Please advise.

## 2019-09-03 NOTE — Telephone Encounter (Signed)
Patient does not need refill currently as he is in the hospital.  Please decline the refill.

## 2019-09-03 DEATH — deceased

## 2019-09-04 ENCOUNTER — Ambulatory Visit
Admission: RE | Admit: 2019-09-04 | Discharge: 2019-09-04 | Disposition: A | Discharge status: Home / Self Care | Payer: Medicare Other | Source: Ambulatory Visit | Attending: Radiation Oncology | Admitting: Radiation Oncology

## 2019-09-04 ENCOUNTER — Ambulatory Visit: Payer: Medicare Other | Admitting: Oncology

## 2019-09-04 ENCOUNTER — Other Ambulatory Visit: Payer: Self-pay | Admitting: Medical Oncology

## 2019-09-04 ENCOUNTER — Other Ambulatory Visit: Payer: Self-pay

## 2019-09-04 DIAGNOSIS — C3411 Malignant neoplasm of upper lobe, right bronchus or lung: Secondary | ICD-10-CM | POA: Diagnosis not present

## 2019-09-04 DIAGNOSIS — C7801 Secondary malignant neoplasm of right lung: Secondary | ICD-10-CM

## 2019-09-04 DIAGNOSIS — Z510 Encounter for antineoplastic radiation therapy: Secondary | ICD-10-CM | POA: Diagnosis not present

## 2019-09-04 DIAGNOSIS — C7931 Secondary malignant neoplasm of brain: Secondary | ICD-10-CM | POA: Diagnosis not present

## 2019-09-08 ENCOUNTER — Encounter: Payer: Self-pay | Admitting: Internal Medicine

## 2019-09-08 ENCOUNTER — Encounter: Payer: Self-pay | Admitting: *Deleted

## 2019-09-08 ENCOUNTER — Ambulatory Visit
Admission: RE | Admit: 2019-09-08 | Discharge: 2019-09-08 | Disposition: A | Discharge status: Home / Self Care | Payer: Medicare Other | Source: Ambulatory Visit | Attending: Radiation Oncology | Admitting: Radiation Oncology

## 2019-09-08 ENCOUNTER — Inpatient Hospital Stay (HOSPITAL_BASED_OUTPATIENT_CLINIC_OR_DEPARTMENT_OTHER): Payer: No Typology Code available for payment source | Admitting: Internal Medicine

## 2019-09-08 ENCOUNTER — Other Ambulatory Visit: Payer: Self-pay

## 2019-09-08 ENCOUNTER — Telehealth: Payer: Self-pay | Admitting: Internal Medicine

## 2019-09-08 ENCOUNTER — Inpatient Hospital Stay: Payer: No Typology Code available for payment source | Attending: Internal Medicine | Admitting: Internal Medicine

## 2019-09-08 ENCOUNTER — Inpatient Hospital Stay: Payer: No Typology Code available for payment source

## 2019-09-08 VITALS — BP 136/71 | HR 104 | Temp 97.9°F | Resp 18 | Ht 68.0 in | Wt 128.5 lb

## 2019-09-08 DIAGNOSIS — C787 Secondary malignant neoplasm of liver and intrahepatic bile duct: Secondary | ICD-10-CM | POA: Insufficient documentation

## 2019-09-08 DIAGNOSIS — Z510 Encounter for antineoplastic radiation therapy: Secondary | ICD-10-CM | POA: Diagnosis not present

## 2019-09-08 DIAGNOSIS — Z87891 Personal history of nicotine dependence: Secondary | ICD-10-CM | POA: Insufficient documentation

## 2019-09-08 DIAGNOSIS — Z5112 Encounter for antineoplastic immunotherapy: Secondary | ICD-10-CM

## 2019-09-08 DIAGNOSIS — C3411 Malignant neoplasm of upper lobe, right bronchus or lung: Secondary | ICD-10-CM | POA: Diagnosis not present

## 2019-09-08 DIAGNOSIS — Z5111 Encounter for antineoplastic chemotherapy: Secondary | ICD-10-CM

## 2019-09-08 DIAGNOSIS — C7951 Secondary malignant neoplasm of bone: Secondary | ICD-10-CM | POA: Insufficient documentation

## 2019-09-08 DIAGNOSIS — C7801 Secondary malignant neoplasm of right lung: Secondary | ICD-10-CM

## 2019-09-08 DIAGNOSIS — G893 Neoplasm related pain (acute) (chronic): Secondary | ICD-10-CM | POA: Insufficient documentation

## 2019-09-08 DIAGNOSIS — C7931 Secondary malignant neoplasm of brain: Secondary | ICD-10-CM | POA: Insufficient documentation

## 2019-09-08 DIAGNOSIS — M069 Rheumatoid arthritis, unspecified: Secondary | ICD-10-CM | POA: Insufficient documentation

## 2019-09-08 DIAGNOSIS — Z7189 Other specified counseling: Secondary | ICD-10-CM

## 2019-09-08 LAB — CMP (CANCER CENTER ONLY)
ALT: 18 U/L (ref 0–44)
AST: 15 U/L (ref 15–41)
Albumin: 3 g/dL — ABNORMAL LOW (ref 3.5–5.0)
Alkaline Phosphatase: 70 U/L (ref 38–126)
Anion gap: 9 (ref 5–15)
BUN: 23 mg/dL (ref 8–23)
CO2: 26 mmol/L (ref 22–32)
Calcium: 8.6 mg/dL — ABNORMAL LOW (ref 8.9–10.3)
Chloride: 100 mmol/L (ref 98–111)
Creatinine: 0.87 mg/dL (ref 0.61–1.24)
GFR, Est AFR Am: >60 mL/min (ref >60)
GFR, Estimated: >60 mL/min (ref >60)
Glucose, Bld: 189 mg/dL — ABNORMAL HIGH (ref 70–99)
Potassium: 4 mmol/L (ref 3.5–5.1)
Sodium: 135 mmol/L (ref 135–145)
Total Bilirubin: 0.9 mg/dL (ref 0.3–1.2)
Total Protein: 6.1 g/dL — ABNORMAL LOW (ref 6.5–8.1)

## 2019-09-08 LAB — CBC WITH DIFFERENTIAL (CANCER CENTER ONLY)
Abs Immature Granulocytes: 0.21 10*3/uL — ABNORMAL HIGH (ref 0.00–0.07)
Basophils Absolute: 0 10*3/uL (ref 0.0–0.1)
Basophils Relative: 0 %
Eosinophils Absolute: 0 10*3/uL (ref 0.0–0.5)
Eosinophils Relative: 0 %
HCT: 40.4 % (ref 39.0–52.0)
Hemoglobin: 13.4 g/dL (ref 13.0–17.0)
Immature Granulocytes: 2 %
Lymphocytes Relative: 2 %
Lymphs Abs: 0.2 10*3/uL — ABNORMAL LOW (ref 0.7–4.0)
MCH: 31.6 pg (ref 26.0–34.0)
MCHC: 33.2 g/dL (ref 30.0–36.0)
MCV: 95.3 fL (ref 80.0–100.0)
Monocytes Absolute: 0.6 10*3/uL (ref 0.1–1.0)
Monocytes Relative: 4 %
Neutro Abs: 12.2 10*3/uL — ABNORMAL HIGH (ref 1.7–7.7)
Neutrophils Relative %: 92 %
Platelet Count: 350 10*3/uL (ref 150–400)
RBC: 4.24 MIL/uL (ref 4.22–5.81)
RDW: 15.8 % — ABNORMAL HIGH (ref 11.5–15.5)
WBC Count: 13.2 10*3/uL — ABNORMAL HIGH (ref 4.0–10.5)
nRBC: 0 % (ref 0.0–0.2)

## 2019-09-08 NOTE — Progress Notes (Signed)
[facility] [place], [place] [address] [phone]   New Patient Evaluation  Date of Service: 09/08/19 Patient Name: ASAH LAMAY Patient MRN: [id number] Patient DOB: 07/13/1932 Provider: Ventura Sellers, MD  Identifying Statement:  SUTTON HIRSCH is a 86 y.o. male with Brain metastases [facility]) who presents for initial consultation and evaluation regarding cancer associated neurologic deficits.    Referring Provider: Prince Solian, MD [place],  [place] [address]  Primary Cancer: [facility] Lung, Stage IV  CNS Oncologic History: 09/08/19: Completes WBRT [facility]) History of Present Illness: The patient's records from the referring physician were obtained and reviewed and the patient interviewed to confirm this HPI.  Janann Colonel presented to medical attention in June 2021 with fevers, confusion and slurred speech.  He was found to have multiple brain metastases and newly uncovered lung mass.  Liver biopsy demonstrated metastatic carcinoma c/w NSCLC.  [name] is his primary oncologist and awaiting tumor genetics to formulate treatment plan.  In the meantime, he has undergone and nearly completed whole brain radiation therapy, given heavy burden of metastases in the CNS.  He is due to transition back to home from the nursing facility at the end of the week.  Continues ambulating with a walker, has left hand disability from chronic rheumatoid arthritis.    Medications: Current Outpatient Medications on File Prior to Visit  Medication Sig Dispense Refill   acetaminophen (TYLENOL) 325 MG tablet Take 650 mg by mouth 2 (two) times daily as needed (arthritis).     bromocriptine (PARLODEL) 2.5 MG tablet Take 2.5 mg by mouth 2 (two) times daily.     dexamethasone (DECADRON) 4 MG tablet Take 1 tablet (4 mg total) by mouth every 12 (twelve) hours.     feeding supplement, ENSURE ENLIVE, (ENSURE ENLIVE) LIQD Take 237 mLs by  mouth 2 (two) times daily between meals. 742 mL 12   folic acid (FOLVITE) 1 MG tablet TAKE 2 TABLETS EVERY DAY (Patient taking differently: Take 2 mg by mouth daily. ) 180 tablet 3   guaiFENesin-dextromethorphan (ROBITUSSIN DM) 100-10 MG/5ML syrup Take 10 mLs by mouth every 4 (four) hours as needed for cough. 236 mL 0   hydrocortisone (CORTEF) 20 MG tablet Take 10-20 mg by mouth daily. 20 mg in the morning and 10mg  at night  2   levothyroxine (SYNTHROID) 150 MCG tablet Take 150 mcg by mouth daily before breakfast.      nystatin (MYCOSTATIN) 100000 UNIT/ML suspension Take 5 mLs by mouth 4 (four) times daily.     oxyCODONE (OXY IR/ROXICODONE) 5 MG immediate release tablet Take 1 tablet (5 mg total) by mouth every 6 (six) hours as needed for severe pain. 30 tablet 0   pantoprazole (PROTONIX) 40 MG tablet Take 1 tablet (40 mg total) by mouth daily. 30 tablet 3   polyethylene glycol (MIRALAX / GLYCOLAX) 17 g packet Take 17 g by mouth daily as needed for moderate constipation or severe constipation. 14 each 0   pravastatin (PRAVACHOL) 20 MG tablet Take 20 mg by mouth daily.      senna-docusate (SENOKOT-S) 8.6-50 MG tablet Take 2 tablets by mouth at bedtime as needed for mild constipation.     testosterone cypionate (DEPOTESTOSTERONE CYPIONATE) 200 MG/ML injection Inject 75 mg into the muscle every 14 (fourteen) days.   5   zolpidem (AMBIEN CR) 6.25 MG CR tablet Take 1 tablet (6.25 mg total) by mouth at bedtime as  needed for sleep. (Patient taking differently: Take 6.25 mg by mouth at bedtime. ) 10 tablet 0   No current facility-administered medications on file prior to visit.    Allergies: No Known Allergies Past Medical History:  Past Medical History:  Diagnosis Date   Arthritis    Benign tumor of pituitary gland ([place])    "wasn't able to get it all"   COPD (chronic obstructive pulmonary disease) ([place])    Pneumonia 06/27/11   "first time"   Shingles    Past Surgical History:    Past Surgical History:  Procedure Laterality Date   CHOLECYSTECTOMY OPEN  2010   IR US GUIDE BX ASP/DRAIN  08/25/2019   KIDNEY SURGERY  2011   "lesion removed right side"   RENAL BIOPSY  2012   right   TRANSPHENOIDAL / TRANSNASAL HYPOPHYSECTOMY / RESECTION PITUITARY TUMOR  ~ 1979   "they couldn't get it all"   Social History:  Social History   Socioeconomic History   Marital status: Widowed    Spouse name: Not on file   Number of children: Not on file   Years of education: Not on file   Highest education level: Not on file  Occupational History   Not on file  Tobacco Use   Smoking status: Former Smoker    Packs/day: 0.50    Years: 10.00    Pack years: 5.00    Types: Cigarettes    Quit date: 08/03/1980    Years since quitting: 39.1   Smokeless tobacco: Never Used  Vaping Use   Vaping Use: Never used  Substance and Sexual Activity   Alcohol use: Yes    Comment: wine occ   Drug use: No   Sexual activity: Yes  Other Topics Concern   Not on file  Social History Narrative   Not on file   Social Determinants of Health   Financial Resource Strain:    Difficulty of Paying Living Expenses:   Food Insecurity:    Worried About Charity fundraiser in the Last Year:    Arboriculturist in the Last Year:   Transportation Needs:    Film/video editor (Medical):    Lack of Transportation (Non-Medical):   Physical Activity:    Days of Exercise per Week:    Minutes of Exercise per Session:   Stress:    Feeling of Stress :   Social Connections:    Frequency of Communication with Friends and Family:    Frequency of Social Gatherings with Friends and Family:    Attends Religious Services:    Active Member of Clubs or Organizations:    Attends Music therapist:    Marital Status:   Intimate Partner Violence:    Fear of Current or Ex-Partner:    Emotionally Abused:    Physically Abused:    Sexually Abused:    Family  History:  Family History  Problem Relation Age of Onset   Heart disease Mother    Cancer Brother    Heart disease Brother     Review of Systems: Constitutional: Doesn't report fevers, chills or abnormal weight loss Eyes: +blurry vision Ears, nose, mouth, throat, and face: Doesn't report sore throat Respiratory: Doesn't report cough, dyspnea or wheezes Cardiovascular: Doesn't report palpitation, chest discomfort  Gastrointestinal:  Doesn't report nausea, constipation, diarrhea GU: Doesn't report incontinence Skin: Doesn't report skin rashes Neurological: Per HPI Musculoskeletal: Doesn't report joint pain Behavioral/Psych: Doesn't report anxiety  Physical Exam:  09/08/19 06/29/19  05/26/19  BP 136/71 122/60 121/74  Pulse Rate 104Abnormal 95 89  Resp 18 -- 16  Temp 97.9 F (36.6 C) 97.3 F (36.3 C)Abnormal --  Temp Source Temporal Temporal --  SpO2 97 % 93 % --  Weight 128 lb 8 oz (58.3 kg) 140 lb 3.2 oz (63.6 kg) 138 lb 9.6 oz (62.9 kg)  Height 5\' 8"  (1.727 m) 5\' 8"  (1.727 m) 5\' 8"  (1.727 m)  Pain Score 0 -- --    KPS: 70. General: Alert, cooperative, pleasant, in no acute distress Head: Normal EENT: No conjunctival injection or scleral icterus.  Lungs: Resp effort normal Cardiac: Regular rate Abdomen: Non-distended abdomen Skin: No rashes cyanosis or petechiae. Extremities: No clubbing or edema  Neurologic Exam: Mental Status: Awake, alert, attentive to examiner. Oriented to self and environment. Language is fluent with intact comprehension. Age advanced psychomotor slowing. Cranial Nerves: Visual acuity is grossly normal. Visual fields are full. Extra-ocular movements intact. No ptosis. Face is symmetric Motor: Tone and bulk are normal. Power is full in both arms and legs, aside from chronic contraction in left hand. Reflexes are symmetric, no pathologic reflexes present.  Sensory: Intact to light touch Gait: Deferred, uses 4 pointed walker  Labs: I have  reviewed the data as listed    Component Value Date/Time   NA 135 09/08/2019 1336   K 4.0 09/08/2019 1336   CL 100 09/08/2019 1336   CO2 26 09/08/2019 1336   GLUCOSE 189 (H) 09/08/2019 1336   BUN 23 09/08/2019 1336   CREATININE 0.87 09/08/2019 1336   CREATININE 0.75 08/12/2019 1001   CALCIUM 8.6 (L) 09/08/2019 1336   PROT 6.1 (L) 09/08/2019 1336   ALBUMIN 3.0 (L) 09/08/2019 1336   AST 15 09/08/2019 1336   ALT 18 09/08/2019 1336   ALKPHOS 70 09/08/2019 1336   BILITOT 0.9 09/08/2019 1336   GFRNONAA >60 09/08/2019 1336   GFRNONAA 84 08/12/2019 1001   GFRAA >60 09/08/2019 1336   GFRAA 98 08/12/2019 1001   Lab Results  Component Value Date   WBC 13.2 (H) 09/08/2019   NEUTROABS 12.2 (H) 09/08/2019   HGB 13.4 09/08/2019   HCT 40.4 09/08/2019   MCV 95.3 09/08/2019   PLT 350 09/08/2019    Imaging:  DG Chest 2 View  Result Date: 08/24/2019 CLINICAL DATA:  Fever.  Probable lung cancer. EXAM: CHEST - 2 VIEW COMPARISON:  CT of the chest, abdomen, and pelvis 08/20/2019. FINDINGS: Heart size normal. Atherosclerotic changes are present at the aortic arch. Right upper lobe spiculated mass in perihilar opacities are again noted. No new airspace disease is present. IMPRESSION: 1. Stable right upper lobe mass and perihilar opacities. 2. No new airspace disease. 3. Atherosclerosis. Electronically Signed   By: San Morelle M.D.   On: 08/24/2019 07:03   DG Chest 2 View  Result Date: 08/20/2019 CLINICAL DATA:  Cough, left rib pain for 2 weeks EXAM: CHEST - 2 VIEW COMPARISON:  10/07/2018 FINDINGS: Frontal and lateral views of the chest demonstrate a stable cardiac silhouette. Persistent areas of scarring are seen within the superior segment right lower lobe. No new airspace disease, effusion, or pneumothorax. Significant background emphysema. Severe right shoulder osteoarthritis. IMPRESSION: 1. Stable scarring within the right lower lobe. 2. Emphysema. 3. No acute process. Electronically  Signed   By: Randa Ngo M.D.   On: 08/20/2019 16:32   DG Ribs Unilateral Left  Result Date: 08/20/2019 CLINICAL DATA:  Cough, left rib pain for 2 weeks EXAM: LEFT RIBS -  2 VIEW COMPARISON:  10/07/2018 FINDINGS: Frontal and oblique views of the left thoracic cage are obtained. There are no acute displaced rib fractures. Visualized portions of the left lung are clear. Chronic scarring within the right lower lobe again noted. IMPRESSION: 1. No acute displaced fracture. Electronically Signed   By: Randa Ngo M.D.   On: 08/20/2019 16:34   CT Head Wo Contrast  Result Date: 08/20/2019 CLINICAL DATA:  Slurred speech and left facial droop.  Lethargy. EXAM: CT HEAD WITHOUT CONTRAST TECHNIQUE: Contiguous axial images were obtained from the base of the skull through the vertex without intravenous contrast. COMPARISON:  None. FINDINGS: Brain: Multiple brain masses disseminated throughout the cerebellum and both cerebral hemispheres consistent with advanced intracranial metastatic disease. Most of these are associated with petechial blood products. No frank hematoma. Many of the lesions are associated with vasogenic edema. There is no midline shift. No hydrocephalus or extra-axial collection. Index left cerebellar lesion image 8 measures 14 mm. Index right temporal lesion image 13 measures 28 mm. Index left occipital lesion image 16 measures 23 mm. Index left frontal lesion image 16 measures 28 mm. Index right frontoparietal lesion image 20 for measures 17 mm. Vascular: There is atherosclerotic calcification of the major vessels at the base of the brain. Skull: Previous trans-sphenoidal surgery. No lytic calvarial lesion. Sinuses/Orbits: Clear/normal Other: None IMPRESSION: Advanced widespread metastatic disease throughout the brain. In total, there are probably 40-50 lesions. Most are associated with petechial blood products. Many are associated with vasogenic edema. No midline shift. No hydrocephalus. Lung  cancer is the most common primary tumor to present with this pattern. Distant trans-sphenoidal pituitary surgery. Electronically Signed   By: Nelson Chimes M.D.   On: 08/20/2019 15:48   CT Chest W Contrast  Result Date: 08/20/2019 CLINICAL DATA:  Altered level of consciousness, diffuse intracranial metastases with unknown primary malignancy EXAM: CT CHEST, ABDOMEN, AND PELVIS WITH CONTRAST TECHNIQUE: Multidetector CT imaging of the chest, abdomen and pelvis was performed following the standard protocol during bolus administration of intravenous contrast. CONTRAST:  179mL OMNIPAQUE IOHEXOL 300 MG/ML  SOLN COMPARISON:  09/22/2018 FINDINGS: CT CHEST FINDINGS Cardiovascular: Heart and great vessels are unremarkable without pericardial effusion. Moderate atherosclerosis of the aorta and coronary vessels. Mediastinum/Nodes: No enlarged mediastinal, hilar, or axillary lymph nodes. Thyroid gland, trachea, and esophagus demonstrate no significant findings. Lungs/Pleura: Extensive background emphysema again noted. The cavitating pneumonia within the right lower lobe on prior study has resolved, with resulting scarring within the superior segment right lower lobe. There is a new rounded somewhat spiculated mass within the periphery of the right upper lobe, measuring 2.6 x 3.8 cm on image 26 of series 2. No effusion or pneumothorax. Central airways are patent. Musculoskeletal: There are no acute or destructive bony lesions. Reconstructed images demonstrate no additional findings. CT ABDOMEN PELVIS FINDINGS Hepatobiliary: There is decreased attenuation throughout the majority of the liver parenchyma consistent with hepatic steatosis. Higher attenuation within segment 7 likely reflects fatty sparing. No mass effect. The gallbladder is surgically absent. Pancreas: Unremarkable. No pancreatic ductal dilatation or surrounding inflammatory changes. Spleen: Normal in size without focal abnormality. Adrenals/Urinary Tract: 5 mm  nonobstructing right renal calculus. There are numerous bilateral renal cortical cysts. The adrenals are unremarkable. Bladder is decompressed, which limits evaluation. Stomach/Bowel: No bowel obstruction or ileus. Diffuse diverticulosis of the distal colon without diverticulitis. No bowel wall thickening or inflammatory change. Vascular/Lymphatic: Extensive atherosclerosis of the abdominal aorta. There is aneurysmal dilatation of the right common iliac artery measuring  up to 2.8 cm. No pathologic adenopathy. Reproductive: Prostate is unremarkable. Other: No free fluid or free gas. No abdominal wall hernia. Musculoskeletal: No acute or destructive bony lesions. Reconstructed images demonstrate no additional findings. IMPRESSION: 1. New rounded somewhat spiculated mass within the periphery of the right upper lobe, measuring 2.6 x 3.8 cm, concerning for malignancy. PET-CT may be useful for further evaluation. 2. Resolution of the previously seen cavitating pneumonia within the right lower lobe, with resulting scarring. 3. Hepatic steatosis, with fatty sparing in the posterior right lobe. 4. A 5 mm nonobstructing right renal calculus. 5. Aneurysmal dilatation of the right common iliac artery measuring up to 2.8 cm. 6. Aortic Atherosclerosis (ICD10-I70.0) and Emphysema (ICD10-J43.9). Electronically Signed   By: Randa Ngo M.D.   On: 08/20/2019 23:28   MR ABDOMEN W WO CONTRAST  Result Date: 08/25/2019 CLINICAL DATA:  86 year old male with history of abdominal mass. Suspected neoplasm. EXAM: MRI ABDOMEN WITHOUT AND WITH CONTRAST TECHNIQUE: Multiplanar multisequence MR imaging of the abdomen was performed both before and after the administration of intravenous contrast. CONTRAST:  40mL GADAVIST GADOBUTROL 1 MMOL/ML IV SOLN COMPARISON:  No prior abdominal MRI. CT the abdomen and pelvis 08/20/2019. FINDINGS: Lower chest: Unremarkable. Hepatobiliary: In the inferior aspect of segment 5 of the liver (axial image 56 of  series 24) there is a 1.4 x 1.4 cm T1 hypointense, T2 iso to slightly hyperintense lesion which demonstrates some low-level internal enhancement, concerning for neoplasm. In segment 3 of the liver (axial image 43 of series 24) there is a subtle T1 hypointense, T2 isointense lesion which does not demonstrate definitive internal enhancement on post gadolinium images (although assessment is limited by the small size of the lesion and motion artifact which affects subtraction imaging). Status post cholecystectomy. No intrahepatic biliary ductal dilatation. Common bile duct measures 6 mm in the porta hepatis. However, the common hepatic duct is mildly dilated measuring up to 14 mm in diameter, and there multiple filling defects in the common hepatic duct, compatible with choledocholithiasis, largest of which measures up to 11 mm (axial image 19 of series 25). Pancreas: No pancreatic mass. No pancreatic ductal dilatation. No pancreatic or peripancreatic fluid collections or inflammatory changes. Spleen:  Unremarkable. Adrenals/Urinary Tract: There are multiple T1 hypointense, T2 hyperintense, nonenhancing lesions associated with both kidneys, largest of which is exophytic extending off the lower pole of the right kidney measuring up to 4.8 x 4.1 cm in diameter, compatible with simple cysts. No suspicious renal lesions are noted. No hydroureteronephrosis in the visualized portions of the abdomen. Bilateral adrenal glands are normal in appearance. Stomach/Bowel: Visualized portions are unremarkable. Vascular/Lymphatic: Aortic atherosclerosis, with aneurysmal dilatation of the right common iliac artery which measures up to 2.6 cm in diameter. No lymphadenopathy noted in the abdomen. Other: No significant volume of ascites noted in the visualized portions of the peritoneal cavity. Musculoskeletal: No aggressive appearing osseous lesions are noted in the visualized portions of the skeleton. IMPRESSION: 1. There are 2 liver  lesions. One of these, in the inferior aspect of segment 5 of the liver, has imaging characteristics concerning for malignancy, potentially a metastatic lesion. Other lesion in segment 3 is too small to definitively characterize, but does not appear to be a simple cyst or other clearly benign lesion. 2. Status post cholecystectomy with choledocholithiasis in the common hepatic duct. Although the common hepatic duct is mildly dilated, there is no other intrahepatic biliary ductal dilatation. Common bile duct is normal in caliber measuring 6 mm in  the porta hepatis. 3. Aortic atherosclerosis, as well as aneurysmal dilatation of the right common iliac artery which measures up to 2.6 cm in diameter. e Electronically Signed   By: Vinnie Langton M.D.   On: 08/25/2019 08:26   CT Abdomen Pelvis W Contrast  Result Date: 08/20/2019 CLINICAL DATA:  Altered level of consciousness, diffuse intracranial metastases with unknown primary malignancy EXAM: CT CHEST, ABDOMEN, AND PELVIS WITH CONTRAST TECHNIQUE: Multidetector CT imaging of the chest, abdomen and pelvis was performed following the standard protocol during bolus administration of intravenous contrast. CONTRAST:  185mL OMNIPAQUE IOHEXOL 300 MG/ML  SOLN COMPARISON:  09/22/2018 FINDINGS: CT CHEST FINDINGS Cardiovascular: Heart and great vessels are unremarkable without pericardial effusion. Moderate atherosclerosis of the aorta and coronary vessels. Mediastinum/Nodes: No enlarged mediastinal, hilar, or axillary lymph nodes. Thyroid gland, trachea, and esophagus demonstrate no significant findings. Lungs/Pleura: Extensive background emphysema again noted. The cavitating pneumonia within the right lower lobe on prior study has resolved, with resulting scarring within the superior segment right lower lobe. There is a new rounded somewhat spiculated mass within the periphery of the right upper lobe, measuring 2.6 x 3.8 cm on image 26 of series 2. No effusion or  pneumothorax. Central airways are patent. Musculoskeletal: There are no acute or destructive bony lesions. Reconstructed images demonstrate no additional findings. CT ABDOMEN PELVIS FINDINGS Hepatobiliary: There is decreased attenuation throughout the majority of the liver parenchyma consistent with hepatic steatosis. Higher attenuation within segment 7 likely reflects fatty sparing. No mass effect. The gallbladder is surgically absent. Pancreas: Unremarkable. No pancreatic ductal dilatation or surrounding inflammatory changes. Spleen: Normal in size without focal abnormality. Adrenals/Urinary Tract: 5 mm nonobstructing right renal calculus. There are numerous bilateral renal cortical cysts. The adrenals are unremarkable. Bladder is decompressed, which limits evaluation. Stomach/Bowel: No bowel obstruction or ileus. Diffuse diverticulosis of the distal colon without diverticulitis. No bowel wall thickening or inflammatory change. Vascular/Lymphatic: Extensive atherosclerosis of the abdominal aorta. There is aneurysmal dilatation of the right common iliac artery measuring up to 2.8 cm. No pathologic adenopathy. Reproductive: Prostate is unremarkable. Other: No free fluid or free gas. No abdominal wall hernia. Musculoskeletal: No acute or destructive bony lesions. Reconstructed images demonstrate no additional findings. IMPRESSION: 1. New rounded somewhat spiculated mass within the periphery of the right upper lobe, measuring 2.6 x 3.8 cm, concerning for malignancy. PET-CT may be useful for further evaluation. 2. Resolution of the previously seen cavitating pneumonia within the right lower lobe, with resulting scarring. 3. Hepatic steatosis, with fatty sparing in the posterior right lobe. 4. A 5 mm nonobstructing right renal calculus. 5. Aneurysmal dilatation of the right common iliac artery measuring up to 2.8 cm. 6. Aortic Atherosclerosis (ICD10-I70.0) and Emphysema (ICD10-J43.9). Electronically Signed   By: Randa Ngo M.D.   On: 08/20/2019 23:28   NM PET Image Initial (PI) Skull Base To Thigh  Result Date: 08/31/2019 CLINICAL DATA:  Initial treatment strategy for lung mass. EXAM: NUCLEAR MEDICINE PET SKULL BASE TO THIGH TECHNIQUE: 6.76 mCi F-18 FDG was injected intravenously. Full-ring PET imaging was performed from the skull base to thigh after the radiotracer. CT data was obtained and used for attenuation correction and anatomic localization. Fasting blood glucose: 124 mg/dl COMPARISON:  CT chest, abdomen, and pelvis 08/20/2019 FINDINGS: Mediastinal blood pool activity: SUV max 2.51 Liver activity: SUV max NA NECK: No hypermetabolic lymph nodes in the neck. Incidental CT findings: none CHEST: No FDG avid axillary, supraclavicular, mediastinal or hilar lymph nodes. Within the periphery of  the right upper lobe there is a spiculated mass measuring 3.5 x 2.1 cm with SUV max of 8.54. Airspace consolidation involving the superior segment of right lower lobe with mild diffuse tracer uptake has an SUV max of 4.50 Subpleural nodule within the superior segment of left lower lobe measures 6 mm within SUV max of 3.15. 4 mm anteromedial left upper lobe lung nodule has an SUV max of 1.24. Scattered calcified granulomas are identified bilaterally. Incidental CT findings: Moderate to advanced changes of emphysema. Aortic atherosclerosis. Three vessel coronary artery atherosclerotic calcifications. ABDOMEN/PELVIS: Inferior right hepatic lobe low-attenuation lesion measures 1.5 cm and has an SUV max of 8.95. No abnormal uptake identified within the pancreas, spleen or adrenal glands. New no hypermetabolic abdominal or pelvic lymph nodes. Incidental CT findings: Aortic atherosclerosis. Infrarenal abdominal aorta is ectatic measuring 2.7 cm. Bilateral kidney cysts. Inferior pole of right kidney stone measures 0.4 cm, image, image 121/4. Aneurysmal dilatation of the right common iliac artery measures 2.8 cm. SKELETON: FDG avid metastasis  within the posterior column of right acetabulum measures 1.8 cm within SUV max of 6.09. Increased radiotracer uptake localizing to the lateral aspect of the left eighth and ninth ribs is likely posttraumatic. Incidental CT findings: Advanced thoracolumbar degenerative disc disease with first degree anterolisthesis of L4 on L5. IMPRESSION: 1. Right upper lobe lung mass is hypermetabolic compatible with primary bronchogenic carcinoma. 2. 2 small nodules within the left lung also exhibit FDG uptake. The largest in the superior segment of left lower lobe has an SUV max of 3.15 and is concerning for metastasis. The smaller nodule is technically too small to reliably characterize measuring 4 mm within SUV max of 124. 3. FDG avid right hepatic lobe metastasis. 4. FDG avid posterior column of right acetabulum bone metastases. 5. Airspace consolidation involving the superior segment of right lower lobe is noted and is favored to represent sequelae of pneumonia. 6. Posttraumatic left lower lateral rib deformities. 7. Aortic Atherosclerosis (ICD10-I70.0) and Emphysema (ICD10-J43.9). Coronary artery calcifications. Electronically Signed   By: Kerby Moors M.D.   On: 08/31/2019 16:00   IR US Guide Bx Asp/Drain  Result Date: 08/25/2019 INDICATION: Remote history of renal cell carcinoma, now with findings worrisome for metastatic lung cancer. Please perform ultrasound-guided biopsy indeterminate liver lesion for tissue diagnostic purposes. EXAM: ULTRASOUND GUIDED LIVER LESION BIOPSY COMPARISON:  Abdominal MRI-08/24/2019; CT of the chest, abdomen and pelvis-08/20/2019 MEDICATIONS: None ANESTHESIA/SEDATION: Fentanyl 100 mcg IV; Versed 2 mg IV Total Moderate Sedation time:  10 Minutes. The patient's level of consciousness and vital signs were monitored continuously by radiology nursing throughout the procedure under my direct supervision. COMPLICATIONS: None immediate. PROCEDURE: Informed written consent was obtained from the  patient after a discussion of the risks, benefits and alternatives to treatment. The patient understands and consents the procedure. A timeout was performed prior to the initiation of the procedure. Ultrasound scanning was performed of the right upper abdominal quadrant demonstrates an approximately 1.6 x 1.6 cm mixed echogenic nodule within the subcapsular caudal aspect of the right lobe of the liver correlating with the lesion seen on abdominal MRI image 56, series 24. The procedure was planned. The right upper abdominal quadrant was prepped and draped in the usual sterile fashion. The overlying soft tissues were anesthetized with 1% lidocaine with epinephrine. A 17 gauge, 6.8 cm co-axial needle was advanced into a peripheral aspect of the lesion. This was followed by 5 core biopsies with an 18 gauge core device under direct ultrasound guidance. The  coaxial needle tract was embolized with a small amount of Gel-Foam slurry and superficial hemostasis was obtained with manual compression. Post procedural scanning was negative for definitive area of hemorrhage or additional complication. A dressing was placed. The patient tolerated the procedure well without immediate post procedural complication. IMPRESSION: Technically successful ultrasound guided core needle biopsy of indeterminate lesion within the caudal aspect of the right lobe of the liver. Electronically Signed   By: Sandi Mariscal M.D.   On: 08/25/2019 12:01   DG CHEST PORT 1 VIEW  Result Date: 08/26/2019 CLINICAL DATA:  Shortness of breath. EXAM: PORTABLE CHEST 1 VIEW COMPARISON:  Chest x-ray dated August 24, 2019. FINDINGS: Stable cardiomediastinal silhouette with normal heart size. Normal pulmonary vascularity. Unchanged peripheral right upper lobe mass. No focal consolidation, pleural effusion, or pneumothorax. Emphysematous changes. No acute osseous abnormality. IMPRESSION: 1. No active disease. 2. Unchanged peripheral right upper lobe mass.  Electronically Signed   By: Titus Dubin M.D.   On: 08/26/2019 09:11     Assessment/Plan Brain metastases (Gas)  Janann Colonel is clinically stable, now having nearly completed whole brain radiation therapy for high burden of CNS metastases.  We counseled him and family today regarding goals of care, treatment pathways for brain tumors, and the nature of neurologic disorders in general.  We recommended decreasing decadron to 4mg  daily x1 week, then 2mg  daily x1 week, then 1mg  daily x1 week, then stopping therapy.  We spent twenty additional minutes teaching regarding the natural history, biology, and historical experience in the treatment of neurologic complications of cancer.   We appreciate the opportunity to participate in the care of IVO MOGA.  We ask that HRIDHAAN YOHN return to clinic in 3 months following next brain MRI, or sooner as needed.  All questions were answered. The patient knows to call the clinic with any problems, questions or concerns. No barriers to learning were detected.  The total time spent in the encounter was 40 minutes and more than 50% was on counseling and review of test results   Ventura Sellers, MD Medical Director of Neuro-Oncology [facility] [place] 09/08/19 4:51 PM

## 2019-09-08 NOTE — Progress Notes (Signed)
I spoke with patient today at his hospital follow up with [name].  His plan is to wait until moleculars are completed and then get a plan of systemic therapy or receive Hospice care.  The patient is currently at [facility] but will be leaving soon with home health.  I asked if she would like to speak to a CSW about his care. I notified CSW.

## 2019-09-08 NOTE — Progress Notes (Signed)
Error note

## 2019-09-08 NOTE — Telephone Encounter (Signed)
Scheduled per 07/06 los, patient received updated calender.

## 2019-09-08 NOTE — Progress Notes (Signed)
[place] [phone]   [phone]  OFFICE PROGRESS NOTE  Prince Sol[place], MD [place] [address]  DIAGNOSIS: Stage IV (T2a, N0, M1c) non-small cell lung cancer, poorly differentiated carcinoma with rhabdoid features presented with right upper lobe lung mass in addition to bilateral pulmonary nodules in addition to liver, innumerable brain metastasis and bone metastasis in the right acetabulum diagnosed in June 2021.  Molecular studies are still pending.  PD-L1 expression 100%  PRIOR THERAPY: Whole brain irradiation under the care of Dr. Lisbeth Renshaw expected to be completed on 09/09/2019.  CURRENT THERAPY: None.  INTERVAL HISTORY: Ryan Coffey 86 y.o. male returns to the clinic today for follow-up visit accompanied by one of his daughter.  His daughter Ivin Booty was also available by FaceTime.  The patient is currently undergoing whole brain irradiation and expected to complete this course tomorrow.  He is currently at the [facility] and expected to be discharged home later this week.  He continues to have fatigue and weakness as well as some visual changes.  He denied having any current chest pain but has shortness of breath with exertion with mild cough and no hemoptysis.  He denied having any fever or chills.  He has no nausea, vomiting, diarrhea or constipation.  He had a PET scan performed recently and he is here today for discussion of the PET scan results and recommendation regarding treatment of his condition.  MEDICAL HISTORY: Past Medical History:  Diagnosis Date  . Arthritis   . Benign tumor of pituitary gland ([place])    "wasn't able to get it all"  . COPD (chronic obstructive pulmonary disease) ([place])   . Pneumonia 06/27/11   "first time"  . Shingles     ALLERGIES:  has No Known Allergies.  MEDICATIONS:  Current Outpatient Medications  Medication Sig Dispense Refill  . acetaminophen (TYLENOL) 325  MG tablet Take 650 mg by mouth 2 (two) times daily as needed (arthritis).    . bromocriptine (PARLODEL) 2.5 MG tablet Take 2.5 mg by mouth 2 (two) times daily.    Marland Kitchen dexamethasone (DECADRON) 4 MG tablet Take 1 tablet (4 mg total) by mouth every 12 (twelve) hours.    . feeding supplement, ENSURE ENLIVE, (ENSURE ENLIVE) LIQD Take 237 mLs by mouth 2 (two) times daily between meals. 119 mL 12  . folic acid (FOLVITE) 1 MG tablet TAKE 2 TABLETS EVERY DAY (Patient taking differently: Take 2 mg by mouth daily. ) 180 tablet 3  . guaiFENesin-dextromethorphan (ROBITUSSIN DM) 100-10 MG/5ML syrup Take 10 mLs by mouth every 4 (four) hours as needed for cough. (Patient not taking: Reported on 08/20/2019) 236 mL 0  . hydrocortisone (CORTEF) 20 MG tablet Take 10-20 mg by mouth daily. 20 mg in the morning and 69m at night  2  . levothyroxine (SYNTHROID) 150 MCG tablet Take 150 mcg by mouth daily before breakfast.     . methotrexate (RHEUMATREX) 2.5 MG tablet TAKE 4 TABLETS ON SATURDAY AND 4 TABLETS ON SUNDAY. CAUTION:CHEMOTHERAPY. PROTECT FROM LIGHT. (Patient taking differently: Take 10 mg by mouth 2 (two) times a week. TAKE 4 tablets on Saturday and 4 tablets on Sunday. Caution:Chemotherapy. Protect from light.) 32 tablet 0  . oxyCODONE (OXY IR/ROXICODONE) 5 MG immediate release tablet Take 1 tablet (5 mg total) by mouth every 6 (six) hours as needed for severe pain. 30 tablet 0  . pantoprazole (PROTONIX) 40 MG tablet Take 1 tablet (40 mg  total) by mouth daily. 30 tablet 3  . polyethylene glycol (MIRALAX / GLYCOLAX) 17 g packet Take 17 g by mouth daily as needed for moderate constipation or severe constipation. 14 each 0  . pravastatin (PRAVACHOL) 20 MG tablet Take 20 mg by mouth daily.     Marland Kitchen senna-docusate (SENOKOT-S) 8.6-50 MG tablet Take 2 tablets by mouth at bedtime as needed for mild constipation.    Marland Kitchen testosterone cypionate (DEPOTESTOSTERONE CYPIONATE) 200 MG/ML injection Inject 75 mg into the muscle every 14  (fourteen) days.   5  . zolpidem (AMBIEN CR) 6.25 MG CR tablet Take 1 tablet (6.25 mg total) by mouth at bedtime as needed for sleep. (Patient taking differently: Take 6.25 mg by mouth at bedtime. ) 10 tablet 0   No current facility-administered medications for this visit.    SURGICAL HISTORY:  Past Surgical History:  Procedure Laterality Date  . CHOLECYSTECTOMY OPEN  2010  . IR US GUIDE BX ASP/DRAIN  08/25/2019  . KIDNEY SURGERY  2011   "lesion removed right side"  . RENAL BIOPSY  2012   right  . TRANSPHENOIDAL / TRANSNASAL HYPOPHYSECTOMY / RESECTION PITUITARY TUMOR  ~ 1979   "they couldn't get it all"    REVIEW OF SYSTEMS:  Constitutional: positive for fatigue and weight loss Eyes: negative Ears, nose, mouth, throat, and face: negative Respiratory: negative Cardiovascular: negative Gastrointestinal: negative Genitourinary:negative Integument/breast: negative Hematologic/lymphatic: negative Musculoskeletal:positive for muscle weakness Neurological: negative Behavioral/Psych: negative Endocrine: negative Allergic/Immunologic: negative   PHYSICAL EXAMINATION: General appearance: alert, cooperative, fatigued and no distress Head: Normocephalic, without obvious abnormality, atraumatic Neck: no adenopathy, no JVD, supple, symmetrical, trachea midline and thyroid not enlarged, symmetric, no tenderness/mass/nodules Lymph nodes: Cervical, supraclavicular, and axillary nodes normal. Resp: clear to auscultation bilaterally Back: symmetric, no curvature. ROM normal. No CVA tenderness. Cardio: regular rate and rhythm, S1, S2 normal, no murmur, click, rub or gallop GI: soft, non-tender; bowel sounds normal; no masses,  no organomegaly Extremities: extremities normal, atraumatic, no cyanosis or edema Neurologic: Alert and oriented X 3, normal strength and tone. Normal symmetric reflexes. Normal coordination and gait  ECOG PERFORMANCE STATUS: 1 - Symptomatic but completely  ambulatory  Blood pressure 136/71, pulse (!) 104, temperature 97.9 F (36.6 C), temperature source Temporal, resp. rate 18, height '5\' 8"'$  (1.727 m), weight 128 lb 8 oz (58.3 kg), SpO2 97 %.  LABORATORY DATA: Lab Results  Component Value Date   WBC 13.2 (H) 09/08/2019   HGB 13.4 09/08/2019   HCT 40.4 09/08/2019   MCV 95.3 09/08/2019   PLT 350 09/08/2019      Chemistry      Component Value Date/Time   NA 135 09/08/2019 1336   K 4.0 09/08/2019 1336   CL 100 09/08/2019 1336   CO2 26 09/08/2019 1336   BUN 23 09/08/2019 1336   CREATININE 0.87 09/08/2019 1336   CREATININE 0.75 08/12/2019 1001      Component Value Date/Time   CALCIUM 8.6 (L) 09/08/2019 1336   ALKPHOS 70 09/08/2019 1336   AST 15 09/08/2019 1336   ALT 18 09/08/2019 1336   BILITOT 0.9 09/08/2019 1336       RADIOGRAPHIC STUDIES: DG Chest 2 View  Result Date: 08/24/2019 CLINICAL DATA:  Fever.  Probable lung cancer. EXAM: CHEST - 2 VIEW COMPARISON:  CT of the chest, abdomen, and pelvis 08/20/2019. FINDINGS: Heart size normal. Atherosclerotic changes are present at the aortic arch. Right upper lobe spiculated mass in perihilar opacities are again noted. No new airspace  disease is present. IMPRESSION: 1. Stable right upper lobe mass and perihilar opacities. 2. No new airspace disease. 3. Atherosclerosis. Electronically Signed   By: Marin Roberts M.D.   On: 08/24/2019 07:03   DG Chest 2 View  Result Date: 08/20/2019 CLINICAL DATA:  Cough, left rib pain for 2 weeks EXAM: CHEST - 2 VIEW COMPARISON:  10/07/2018 FINDINGS: Frontal and lateral views of the chest demonstrate a stable cardiac silhouette. Persistent areas of scarring are seen within the superior segment right lower lobe. No new airspace disease, effusion, or pneumothorax. Significant background emphysema. Severe right shoulder osteoarthritis. IMPRESSION: 1. Stable scarring within the right lower lobe. 2. Emphysema. 3. No acute process. Electronically Signed    By: Sharlet Salina M.D.   On: 08/20/2019 16:32   DG Ribs Unilateral Left  Result Date: 08/20/2019 CLINICAL DATA:  Cough, left rib pain for 2 weeks EXAM: LEFT RIBS - 2 VIEW COMPARISON:  10/07/2018 FINDINGS: Frontal and oblique views of the left thoracic cage are obtained. There are no acute displaced rib fractures. Visualized portions of the left lung are clear. Chronic scarring within the right lower lobe again noted. IMPRESSION: 1. No acute displaced fracture. Electronically Signed   By: Sharlet Salina M.D.   On: 08/20/2019 16:34   CT Head Wo Contrast  Result Date: 08/20/2019 CLINICAL DATA:  Slurred speech and left facial droop.  Lethargy. EXAM: CT HEAD WITHOUT CONTRAST TECHNIQUE: Contiguous axial images were obtained from the base of the skull through the vertex without intravenous contrast. COMPARISON:  None. FINDINGS: Brain: Multiple brain masses disseminated throughout the cerebellum and both cerebral hemispheres consistent with advanced intracranial metastatic disease. Most of these are associated with petechial blood products. No frank hematoma. Many of the lesions are associated with vasogenic edema. There is no midline shift. No hydrocephalus or extra-axial collection. Index left cerebellar lesion image 8 measures 14 mm. Index right temporal lesion image 13 measures 28 mm. Index left occipital lesion image 16 measures 23 mm. Index left frontal lesion image 16 measures 28 mm. Index right frontoparietal lesion image 20 for measures 17 mm. Vascular: There is atherosclerotic calcification of the major vessels at the base of the brain. Skull: Previous trans-sphenoidal surgery. No lytic calvarial lesion. Sinuses/Orbits: Clear/normal Other: None IMPRESSION: Advanced widespread metastatic disease throughout the brain. In total, there are probably 40-50 lesions. Most are associated with petechial blood products. Many are associated with vasogenic edema. No midline shift. No hydrocephalus. Lung cancer is the  most common primary tumor to present with this pattern. Distant trans-sphenoidal pituitary surgery. Electronically Signed   By: Paulina Fusi M.D.   On: 08/20/2019 15:48   CT Chest W Contrast  Result Date: 08/20/2019 CLINICAL DATA:  Altered level of consciousness, diffuse intracranial metastases with unknown primary malignancy EXAM: CT CHEST, ABDOMEN, AND PELVIS WITH CONTRAST TECHNIQUE: Multidetector CT imaging of the chest, abdomen and pelvis was performed following the standard protocol during bolus administration of intravenous contrast. CONTRAST:  OMNIPAQUE IOHEXOL 300 MG/ML  SOLN COMPARISON:  09/22/2018 FINDINGS: CT CHEST FINDINGS Cardiovascular: Heart and great vessels are unremarkable without pericardial effusion. Moderate atherosclerosis of the aorta and coronary vessels. Mediastinum/Nodes: No enlarged mediastinal, hilar, or axillary lymph nodes. Thyroid gland, trachea, and esophagus demonstrate no significant findings. Lungs/Pleura: Extensive background emphysema again noted. The cavitating pneumonia within the right lower lobe on prior study has resolved, with resulting scarring within the superior segment right lower lobe. There is a new rounded somewhat spiculated mass within the periphery of the  right upper lobe, measuring 2.6 x 3.8 cm on image 26 of series 2. No effusion or pneumothorax. Central airways are patent. Musculoskeletal: There are no acute or destructive bony lesions. Reconstructed images demonstrate no additional findings. CT ABDOMEN PELVIS FINDINGS Hepatobiliary: There is decreased attenuation throughout the majority of the liver parenchyma consistent with hepatic steatosis. Higher attenuation within segment 7 likely reflects fatty sparing. No mass effect. The gallbladder is surgically absent. Pancreas: Unremarkable. No pancreatic ductal dilatation or surrounding inflammatory changes. Spleen: Normal in size without focal abnormality. Adrenals/Urinary Tract: 5 mm nonobstructing  right renal calculus. There are numerous bilateral renal cortical cysts. The adrenals are unremarkable. Bladder is decompressed, which limits evaluation. Stomach/Bowel: No bowel obstruction or ileus. Diffuse diverticulosis of the distal colon without diverticulitis. No bowel wall thickening or inflammatory change. Vascular/Lymphatic: Extensive atherosclerosis of the abdominal aorta. There is aneurysmal dilatation of the right common iliac artery measuring up to 2.8 cm. No pathologic adenopathy. Reproductive: Prostate is unremarkable. Other: No free fluid or free gas. No abdominal wall hernia. Musculoskeletal: No acute or destructive bony lesions. Reconstructed images demonstrate no additional findings. IMPRESSION: 1. New rounded somewhat spiculated mass within the periphery of the right upper lobe, measuring 2.6 x 3.8 cm, concerning for malignancy. PET-CT may be useful for further evaluation. 2. Resolution of the previously seen cavitating pneumonia within the right lower lobe, with resulting scarring. 3. Hepatic steatosis, with fatty sparing in the posterior right lobe. 4. A 5 mm nonobstructing right renal calculus. 5. Aneurysmal dilatation of the right common iliac artery measuring up to 2.8 cm. 6. Aortic Atherosclerosis (ICD10-I70.0) and Emphysema (ICD10-J43.9). Electronically Signed   By: Randa Ngo M.D.   On: 08/20/2019 23:28   MR ABDOMEN W WO CONTRAST  Result Date: 08/25/2019 CLINICAL DATA:  86 year old male with history of abdominal mass. Suspected neoplasm. EXAM: MRI ABDOMEN WITHOUT AND WITH CONTRAST TECHNIQUE: Multiplanar multisequence MR imaging of the abdomen was performed both before and after the administration of intravenous contrast. CONTRAST:  35m GADAVIST GADOBUTROL 1 MMOL/ML IV SOLN COMPARISON:  No prior abdominal MRI. CT the abdomen and pelvis 08/20/2019. FINDINGS: Lower chest: Unremarkable. Hepatobiliary: In the inferior aspect of segment 5 of the liver (axial image 56 of series 24) there  is a 1.4 x 1.4 cm T1 hypointense, T2 iso to slightly hyperintense lesion which demonstrates some low-level internal enhancement, concerning for neoplasm. In segment 3 of the liver (axial image 43 of series 24) there is a subtle T1 hypointense, T2 isointense lesion which does not demonstrate definitive internal enhancement on post gadolinium images (although assessment is limited by the small size of the lesion and motion artifact which affects subtraction imaging). Status post cholecystectomy. No intrahepatic biliary ductal dilatation. Common bile duct measures 6 mm in the porta hepatis. However, the common hepatic duct is mildly dilated measuring up to 14 mm in diameter, and there multiple filling defects in the common hepatic duct, compatible with choledocholithiasis, largest of which measures up to 11 mm (axial image 19 of series 25). Pancreas: No pancreatic mass. No pancreatic ductal dilatation. No pancreatic or peripancreatic fluid collections or inflammatory changes. Spleen:  Unremarkable. Adrenals/Urinary Tract: There are multiple T1 hypointense, T2 hyperintense, nonenhancing lesions associated with both kidneys, largest of which is exophytic extending off the lower pole of the right kidney measuring up to 4.8 x 4.1 cm in diameter, compatible with simple cysts. No suspicious renal lesions are noted. No hydroureteronephrosis in the visualized portions of the abdomen. Bilateral adrenal glands are normal in  appearance. Stomach/Bowel: Visualized portions are unremarkable. Vascular/Lymphatic: Aortic atherosclerosis, with aneurysmal dilatation of the right common iliac artery which measures up to 2.6 cm in diameter. No lymphadenopathy noted in the abdomen. Other: No significant volume of ascites noted in the visualized portions of the peritoneal cavity. Musculoskeletal: No aggressive appearing osseous lesions are noted in the visualized portions of the skeleton. IMPRESSION: 1. There are 2 liver lesions. One of  these, in the inferior aspect of segment 5 of the liver, has imaging characteristics concerning for malignancy, potentially a metastatic lesion. Other lesion in segment 3 is too small to definitively characterize, but does not appear to be a simple cyst or other clearly benign lesion. 2. Status post cholecystectomy with choledocholithiasis in the common hepatic duct. Although the common hepatic duct is mildly dilated, there is no other intrahepatic biliary ductal dilatation. Common bile duct is normal in caliber measuring 6 mm in the porta hepatis. 3. Aortic atherosclerosis, as well as aneurysmal dilatation of the right common iliac artery which measures up to 2.6 cm in diameter. e Electronically Signed   By: Vinnie Langton M.D.   On: 08/25/2019 08:26   CT Abdomen Pelvis W Contrast  Result Date: 08/20/2019 CLINICAL DATA:  Altered level of consciousness, diffuse intracranial metastases with unknown primary malignancy EXAM: CT CHEST, ABDOMEN, AND PELVIS WITH CONTRAST TECHNIQUE: Multidetector CT imaging of the chest, abdomen and pelvis was performed following the standard protocol during bolus administration of intravenous contrast. CONTRAST:  173m OMNIPAQUE IOHEXOL 300 MG/ML  SOLN COMPARISON:  09/22/2018 FINDINGS: CT CHEST FINDINGS Cardiovascular: Heart and great vessels are unremarkable without pericardial effusion. Moderate atherosclerosis of the aorta and coronary vessels. Mediastinum/Nodes: No enlarged mediastinal, hilar, or axillary lymph nodes. Thyroid gland, trachea, and esophagus demonstrate no significant findings. Lungs/Pleura: Extensive background emphysema again noted. The cavitating pneumonia within the right lower lobe on prior study has resolved, with resulting scarring within the superior segment right lower lobe. There is a new rounded somewhat spiculated mass within the periphery of the right upper lobe, measuring 2.6 x 3.8 cm on image 26 of series 2. No effusion or pneumothorax. Central  airways are patent. Musculoskeletal: There are no acute or destructive bony lesions. Reconstructed images demonstrate no additional findings. CT ABDOMEN PELVIS FINDINGS Hepatobiliary: There is decreased attenuation throughout the majority of the liver parenchyma consistent with hepatic steatosis. Higher attenuation within segment 7 likely reflects fatty sparing. No mass effect. The gallbladder is surgically absent. Pancreas: Unremarkable. No pancreatic ductal dilatation or surrounding inflammatory changes. Spleen: Normal in size without focal abnormality. Adrenals/Urinary Tract: 5 mm nonobstructing right renal calculus. There are numerous bilateral renal cortical cysts. The adrenals are unremarkable. Bladder is decompressed, which limits evaluation. Stomach/Bowel: No bowel obstruction or ileus. Diffuse diverticulosis of the distal colon without diverticulitis. No bowel wall thickening or inflammatory change. Vascular/Lymphatic: Extensive atherosclerosis of the abdominal aorta. There is aneurysmal dilatation of the right common iliac artery measuring up to 2.8 cm. No pathologic adenopathy. Reproductive: Prostate is unremarkable. Other: No free fluid or free gas. No abdominal wall hernia. Musculoskeletal: No acute or destructive bony lesions. Reconstructed images demonstrate no additional findings. IMPRESSION: 1. New rounded somewhat spiculated mass within the periphery of the right upper lobe, measuring 2.6 x 3.8 cm, concerning for malignancy. PET-CT may be useful for further evaluation. 2. Resolution of the previously seen cavitating pneumonia within the right lower lobe, with resulting scarring. 3. Hepatic steatosis, with fatty sparing in the posterior right lobe. 4. A 5 mm nonobstructing right renal  calculus. 5. Aneurysmal dilatation of the right common iliac artery measuring up to 2.8 cm. 6. Aortic Atherosclerosis (ICD10-I70.0) and Emphysema (ICD10-J43.9). Electronically Signed   By: Randa Ngo M.D.   On:  08/20/2019 23:28   NM PET Image Initial (PI) Skull Base To Thigh  Result Date: 08/31/2019 CLINICAL DATA:  Initial treatment strategy for lung mass. EXAM: NUCLEAR MEDICINE PET SKULL BASE TO THIGH TECHNIQUE: 6.76 mCi F-18 FDG was injected intravenously. Full-ring PET imaging was performed from the skull base to thigh after the radiotracer. CT data was obtained and used for attenuation correction and anatomic localization. Fasting blood glucose: 124 mg/dl COMPARISON:  CT chest, abdomen, and pelvis 08/20/2019 FINDINGS: Mediastinal blood pool activity: SUV max 2.51 Liver activity: SUV max NA NECK: No hypermetabolic lymph nodes in the neck. Incidental CT findings: none CHEST: No FDG avid axillary, supraclavicular, mediastinal or hilar lymph nodes. Within the periphery of the right upper lobe there is a spiculated mass measuring 3.5 x 2.1 cm with SUV max of 8.54. Airspace consolidation involving the superior segment of right lower lobe with mild diffuse tracer uptake has an SUV max of 4.50 Subpleural nodule within the superior segment of left lower lobe measures 6 mm within SUV max of 3.15. 4 mm anteromedial left upper lobe lung nodule has an SUV max of 1.24. Scattered calcified granulomas are identified bilaterally. Incidental CT findings: Moderate to advanced changes of emphysema. Aortic atherosclerosis. Three vessel coronary artery atherosclerotic calcifications. ABDOMEN/PELVIS: Inferior right hepatic lobe low-attenuation lesion measures 1.5 cm and has an SUV max of 8.95. No abnormal uptake identified within the pancreas, spleen or adrenal glands. New no hypermetabolic abdominal or pelvic lymph nodes. Incidental CT findings: Aortic atherosclerosis. Infrarenal abdominal aorta is ectatic measuring 2.7 cm. Bilateral kidney cysts. Inferior pole of right kidney stone measures 0.4 cm, image, image 121/4. Aneurysmal dilatation of the right common iliac artery measures 2.8 cm. SKELETON: FDG avid metastasis within the  posterior column of right acetabulum measures 1.8 cm within SUV max of 6.09. Increased radiotracer uptake localizing to the lateral aspect of the left eighth and ninth ribs is likely posttraumatic. Incidental CT findings: Advanced thoracolumbar degenerative disc disease with first degree anterolisthesis of L4 on L5. IMPRESSION: 1. Right upper lobe lung mass is hypermetabolic compatible with primary bronchogenic carcinoma. 2. 2 small nodules within the left lung also exhibit FDG uptake. The largest in the superior segment of left lower lobe has an SUV max of 3.15 and is concerning for metastasis. The smaller nodule is technically too small to reliably characterize measuring 4 mm within SUV max of 124. 3. FDG avid right hepatic lobe metastasis. 4. FDG avid posterior column of right acetabulum bone metastases. 5. Airspace consolidation involving the superior segment of right lower lobe is noted and is favored to represent sequelae of pneumonia. 6. Posttraumatic left lower lateral rib deformities. 7. Aortic Atherosclerosis (ICD10-I70.0) and Emphysema (ICD10-J43.9). Coronary artery calcifications. Electronically Signed   By: Kerby Moors M.D.   On: 08/31/2019 16:00   IR US Guide Bx Asp/Drain  Result Date: 08/25/2019 INDICATION: Remote history of renal cell carcinoma, now with findings worrisome for metastatic lung cancer. Please perform ultrasound-guided biopsy indeterminate liver lesion for tissue diagnostic purposes. EXAM: ULTRASOUND GUIDED LIVER LESION BIOPSY COMPARISON:  Abdominal MRI-08/24/2019; CT of the chest, abdomen and pelvis-08/20/2019 MEDICATIONS: None ANESTHESIA/SEDATION: Fentanyl 100 mcg IV; Versed 2 mg IV Total Moderate Sedation time:  10 Minutes. The patient's level of consciousness and vital signs were monitored continuously by radiology nursing  throughout the procedure under my direct supervision. COMPLICATIONS: None immediate. PROCEDURE: Informed written consent was obtained from the patient  after a discussion of the risks, benefits and alternatives to treatment. The patient understands and consents the procedure. A timeout was performed prior to the initiation of the procedure. Ultrasound scanning was performed of the right upper abdominal quadrant demonstrates an approximately 1.6 x 1.6 cm mixed echogenic nodule within the subcapsular caudal aspect of the right lobe of the liver correlating with the lesion seen on abdominal MRI image 56, series 24. The procedure was planned. The right upper abdominal quadrant was prepped and draped in the usual sterile fashion. The overlying soft tissues were anesthetized with 1% lidocaine with epinephrine. A 17 gauge, 6.8 cm co-axial needle was advanced into a peripheral aspect of the lesion. This was followed by 5 core biopsies with an 18 gauge core device under direct ultrasound guidance. The coaxial needle tract was embolized with a small amount of Gel-Foam slurry and superficial hemostasis was obtained with manual compression. Post procedural scanning was negative for definitive area of hemorrhage or additional complication. A dressing was placed. The patient tolerated the procedure well without immediate post procedural complication. IMPRESSION: Technically successful ultrasound guided core needle biopsy of indeterminate lesion within the caudal aspect of the right lobe of the liver. Electronically Signed   By: Sandi Mariscal M.D.   On: 08/25/2019 12:01   DG CHEST PORT 1 VIEW  Result Date: 08/26/2019 CLINICAL DATA:  Shortness of breath. EXAM: PORTABLE CHEST 1 VIEW COMPARISON:  Chest x-ray dated August 24, 2019. FINDINGS: Stable cardiomediastinal silhouette with normal heart size. Normal pulmonary vascularity. Unchanged peripheral right upper lobe mass. No focal consolidation, pleural effusion, or pneumothorax. Emphysematous changes. No acute osseous abnormality. IMPRESSION: 1. No active disease. 2. Unchanged peripheral right upper lobe mass. Electronically Signed    By: Titus Dubin M.D.   On: 08/26/2019 09:11    ASSESSMENT AND PLAN: This is a very pleasant 86 years old white male recently diagnosed with a stage IV (T2 a, N0, M1 C) non-small cell lung cancer, poorly differentiated carcinoma with rhabdoid feature presented with right upper lobe lung mass in addition to bilateral pulmonary nodules as well as innumerable brain metastasis, liver and bone metastasis diagnosed in June 2021. The molecular studies are still pending but the patient had PD-L1 expression of 100%. I had a lengthy discussion with the patient and his family about his current condition and treatment options.  I personally and independently reviewed the PET scan images and discussed the result and showed the images to the patient and his daughters. I explained to the patient and his family that he has incurable condition and all the treatment options will be of palliative nature. I gave him the option of palliative care and hospice referral versus consideration of palliative treatment with immunotherapy with single agent Keytruda since the patient has PD-L1 expression of 100% but only if the molecular studies are negative for actionable mutations. The molecular studies are expected to become available early next week. I will arrange for the patient a follow-up appointment with me next week after the availability of the molecular studies for more detailed discussion of his treatment options. The patient could be also considered for a combination of chemotherapy and immunotherapy but because of his age and comorbidities, I would have a preference for a single agent immunotherapy with Beryle Flock if he has no actionable mutations. For the weakness, he will continue with physical therapy at home after  discharge from the skilled nursing facility. For the innumerable brain metastasis, he will finish his whole brain irradiation tomorrow and I recommended for him to taper the Decadron to 4 mg p.o. daily  starting tomorrow. The patient was also advised to call immediately if he has any other concerning symptoms in the interval. The patient voices understanding of current disease status and treatment options and is in agreement with the current care plan.  All questions were answered. The patient knows to call the clinic with any problems, questions or concerns. We can certainly see the patient much sooner if necessary.  The total time spent in the appointment was 60 minutes.  Disclaimer: This note was dictated with voice recognition software. Similar sounding words can inadvertently be transcribed and may not be corrected upon review.

## 2019-09-09 ENCOUNTER — Ambulatory Visit
Admission: RE | Admit: 2019-09-09 | Discharge: 2019-09-09 | Disposition: A | Discharge status: Home / Self Care | Payer: Medicare Other | Source: Ambulatory Visit | Attending: Radiation Oncology | Admitting: Radiation Oncology

## 2019-09-09 ENCOUNTER — Encounter: Payer: Self-pay | Admitting: *Deleted

## 2019-09-09 ENCOUNTER — Other Ambulatory Visit: Payer: Self-pay

## 2019-09-09 DIAGNOSIS — C7931 Secondary malignant neoplasm of brain: Secondary | ICD-10-CM | POA: Diagnosis not present

## 2019-09-09 DIAGNOSIS — C3411 Malignant neoplasm of upper lobe, right bronchus or lung: Secondary | ICD-10-CM | POA: Diagnosis not present

## 2019-09-09 DIAGNOSIS — Z510 Encounter for antineoplastic radiation therapy: Secondary | ICD-10-CM | POA: Diagnosis not present

## 2019-09-09 NOTE — Progress Notes (Signed)
I followed up on Ryan Coffey's molecular testing.  [facility] patient report portal indicates completion of test on 09/14/19.  Patient has a follow up with Cassie on 7/14 for an update on results.

## 2019-09-10 ENCOUNTER — Ambulatory Visit: Payer: Medicare Other

## 2019-09-10 ENCOUNTER — Telehealth: Payer: Self-pay | Admitting: *Deleted

## 2019-09-10 ENCOUNTER — Other Ambulatory Visit: Payer: Self-pay | Admitting: Physician Assistant

## 2019-09-10 ENCOUNTER — Non-Acute Institutional Stay: Payer: Medicare Other

## 2019-09-10 ENCOUNTER — Encounter: Payer: Self-pay | Admitting: General Practice

## 2019-09-10 ENCOUNTER — Telehealth: Payer: Self-pay | Admitting: Internal Medicine

## 2019-09-10 ENCOUNTER — Other Ambulatory Visit: Payer: Self-pay | Admitting: *Deleted

## 2019-09-10 DIAGNOSIS — C7931 Secondary malignant neoplasm of brain: Secondary | ICD-10-CM

## 2019-09-10 DIAGNOSIS — Z515 Encounter for palliative care: Secondary | ICD-10-CM

## 2019-09-10 DIAGNOSIS — B370 Candidal stomatitis: Secondary | ICD-10-CM

## 2019-09-10 NOTE — Telephone Encounter (Signed)
Scheduled per 7/6 los. Spoke with pt's daughter and is aware of appts added.

## 2019-09-10 NOTE — Telephone Encounter (Signed)
Daughter Ivin Booty called to remark on ongoing pain that patient is having from bone mets.  She requested a call back to her other sister Colletta Maryland who can be reached easier.  Spoke with Colletta Maryland and advised that another provider was ordering pain medication and upon further discussion found out that was hospitalist who ordered pain medication.  She also advised that they had a hospice consult ordered by PCP scheduled after he is discharged from [facility] which is due for this Saturday.    Explained that if they choose the route of hospice then Hospice would be a great resource for pain management of his bone mets.    She expressed a lot of questions about his extensive diagnosis and questioned how much time do they really have with him.  Advised that when they meet with Cassie next week to get the results from molecular studies they should ask these questions.  Hopefully with having the results back they would have a better picture of how much palliative care we can provide vs if they really should proceed with hospice.  She really wants to get a clearer picture of how much time she has with her dad so she can make appropriate plans and if needed take a leave of absence from work to be with him "if" his final days are within the next few months.    She also questioned the order for Diflucan for his thrush.  Found patient takes long term medication that is contraindicated to take Diflucan.  Confirmed in fact he has been taking the bromocriptine with [facility] nursing home.  They will restart patient on Nystatin and Magic mouthwash.

## 2019-09-11 ENCOUNTER — Telehealth: Payer: Self-pay | Admitting: General Practice

## 2019-09-11 ENCOUNTER — Other Ambulatory Visit: Payer: Self-pay

## 2019-09-11 ENCOUNTER — Telehealth: Payer: Self-pay | Admitting: Medical Oncology

## 2019-09-11 ENCOUNTER — Non-Acute Institutional Stay: Payer: Medicare Other | Admitting: *Deleted

## 2019-09-11 ENCOUNTER — Other Ambulatory Visit: Payer: Self-pay | Admitting: *Deleted

## 2019-09-11 DIAGNOSIS — Z515 Encounter for palliative care: Secondary | ICD-10-CM

## 2019-09-11 NOTE — Progress Notes (Signed)
COMMUNITY PALLIATIVE CARE RN NOTE  PATIENT NAME: Ryan Coffey DOB: [birth year] MRN: [id number]  PRIMARY CARE PROVIDER: Prince Solian, MD  RESPONSIBLE PARTY: Ryan Coffey (daughter) Acct ID - Guarantor Home Phone Work Phone Relationship Acct Type  [id number] [name], [name]  Self P/F     [address], [place], [place] [address]   Covid-19 Pre-screening Negative  PLAN OF CARE and INTERVENTION:  1. ADVANCE CARE PLANNING/GOALS OF CARE: Goal is for patient to return home tomorrow from [facility] facility. 2. PATIENT/CAREGIVER EDUCATION: Explained palliative care services, pain management, symptom management 3. DISEASE STATUS: Received a call from patient's daughter, Ryan Coffey. [facility]'s SW, Ryan Coffey visited with patient yesterday and called and left her a voicemail to provide an update regarding her visit with patient. It was requested that I contact her daughter since our SW is out of the office today to provide patient update as she had some additional questions. Ryan Coffey states that patient is supposed to return home today or tomorrow from the nursing home, and they were concerned because patient has been experiencing an increase in his pain and has started to require PRN Oxycodone when the Tylenol is ineffective for the past few days. She also states that patient did not sleep well last night d/t pain. She also wants to make sure that patient has enough pain medication to last him until he meets with his Oncologist on Wednesday. She says that the referral that was made to [facility] was initially supposed to be for hospice vs palliative care. I explained the difference between what both services offer. She says that patient has been diagnosed with lung cancer and it has spread to his liver, brain and bones. He may be offered the opportunity to take an oral chemotherapy or immunotherapy, but is not sure about what patient will decide until after his Oncology  appointment. Explained that he would not be eligible for hospice if he chooses either one of these treatment options. They want to remain under Palliative care services at this time. RN visit made to patient at the facility. Upon arrival, he is sitting up in his chair awake and alert. He is able to engage in appropriate conversation. He says that his pain is under control at this time, and he did just take an Oxycodone tablet around 2:30p today and it was effective. He usually experiences pain in his spine. He also has RA with a severe left hand contracture. His left ring finger is where most of his pain is. He says that the reason he did not sleep well last night, was not d/t pain, but more due to having a lot on his mind. He says that about 3 weeks ago he was still driving, visiting friends and family and not having any issues. Then during his recent hospitalization he was diagnosed with metatastatic lung cancer and this has been difficult to accept. He is not sure if he will opt for treatment until receiving more information at his oncology appointment. He just completed brain radiation 2 days ago.  I spoke with the staff nurse, Ryan Coffey, who checked the medication cart and patient has 60 tablets of Oxycodone left, so will have an adequate supply to return home with tomorrow. Ryan Coffey also states that one of the reasons patient did not sleep well was d/t not receiving his scheduled Ambien 12.5 mg last night, but they did receive 3 doses from the pharmacy today so he will have this tonight. He is ambulatory using his walker and  remains able to bathe, dress, toilet and feed himself, but has to be careful. He has been working with PT/OT. I called and left Ryan Coffey an update on her voicemail providing her with this information and left my contact information for a return call. Will touch base with patient/family after his appointment to follow up on future plans.   HISTORY OF PRESENT ILLNESS: This is a 86 yo male with a  diagnosis of metastatic lung cancer. He is scheduled to return home from [facility] facility tomorrow. He was discharged from the hospital to the facility after a 4 day hospital stay d/t fever and generalized weakness. Will continue to follow patient under Palliative care at this time.  CODE STATUS: DNR  ADVANCED DIRECTIVES: Y MOST FORM: no PPS: 50%   (Duration of visit and documentation 60 minutes)   Ryan Eastern, RN BSN

## 2019-09-11 NOTE — Telephone Encounter (Signed)
[place] CSW Progress Notes  Second call to daughter Colletta Maryland per referral.  She was unable to speak at this time, has multiple calls coming in re her father's situation.  States she will call back when convenient.  Closing referral at this time, will await return call. Please reconsult if needed.   Edwyna Shell, LCSW Clinical Social Worker Phone:  [phone] Cell:  [phone]

## 2019-09-11 NOTE — Patient Outreach (Signed)
Member screened for potential [facility] Care Management needs as a benefit of [place] [employer].  Ryan Coffey is receiving skilled therapy at [facility]. Facility previously indicated that Ryan Coffey will transition home on Saturday, July 10th.  Telephone call made to daughter/DPR Ryan Coffey [phone]. Patient identifiers confirmed. Ryan Coffey confirms Ryan Coffey will transition come home on tomorrow, Saturday 7/10. States they have caregivers lined up during the day and that both she and her sister will provide care at night. Both Ryan Coffey and [name] work full time. Ryan Coffey states Ryan Coffey was independent prior. States they will have a live- in caregiver in about 2 weeks.   Discussed [facility] Care Management services. Ryan Coffey states they have an appointment with the oncologist on next Wednesday, 7/14 to discuss further plans. States Ryan Coffey has stage IV lung cancer with mets to his bone, liver, brain. States Ryan Coffey is in a lot of pain. States she thought hospice was going to be lined up but states they were advised to hold off on hospice until after oncologist appointment on Wednesday.   Ryan Coffey agrees that [place] Management referral may not be appropriate at this time since hospice is likely to engage for services after onclogist appointment. Discussed that Probation officer will plan on calling Ryan Coffey next Thursday to follow up. Will send [place] Management and writer's contact information via email to [facility]  Will plan to follow up with daughter/Ryan Coffey next week.   Ryan Rolling, MSN-Ed, RN,BSN [facility] Care Coordinator [phone] [facility]) [phone]  (Toll free office)

## 2019-09-11 NOTE — Telephone Encounter (Signed)
Dr. Dagmar Hait referred Ryan Coffey to Hospice.Will Mohamed be attending?  I told Hospice [name] will not be attending and  that pt has appt 09/16/19.

## 2019-09-14 ENCOUNTER — Encounter (HOSPITAL_COMMUNITY): Payer: Self-pay

## 2019-09-14 DIAGNOSIS — M069 Rheumatoid arthritis, unspecified: Secondary | ICD-10-CM | POA: Diagnosis not present

## 2019-09-14 DIAGNOSIS — E291 Testicular hypofunction: Secondary | ICD-10-CM | POA: Diagnosis not present

## 2019-09-14 DIAGNOSIS — L89151 Pressure ulcer of sacral region, stage 1: Secondary | ICD-10-CM | POA: Diagnosis not present

## 2019-09-14 DIAGNOSIS — C3490 Malignant neoplasm of unspecified part of unspecified bronchus or lung: Secondary | ICD-10-CM | POA: Diagnosis not present

## 2019-09-14 DIAGNOSIS — E039 Hypothyroidism, unspecified: Secondary | ICD-10-CM | POA: Diagnosis not present

## 2019-09-14 DIAGNOSIS — R358 Other polyuria: Secondary | ICD-10-CM | POA: Diagnosis not present

## 2019-09-14 DIAGNOSIS — G4700 Insomnia, unspecified: Secondary | ICD-10-CM | POA: Diagnosis not present

## 2019-09-14 DIAGNOSIS — Z7189 Other specified counseling: Secondary | ICD-10-CM | POA: Diagnosis not present

## 2019-09-14 DIAGNOSIS — E230 Hypopituitarism: Secondary | ICD-10-CM | POA: Diagnosis not present

## 2019-09-15 ENCOUNTER — Other Ambulatory Visit: Payer: Self-pay | Admitting: Medical Oncology

## 2019-09-15 DIAGNOSIS — C7801 Secondary malignant neoplasm of right lung: Secondary | ICD-10-CM

## 2019-09-15 DIAGNOSIS — Z5111 Encounter for antineoplastic chemotherapy: Secondary | ICD-10-CM

## 2019-09-15 NOTE — Progress Notes (Signed)
[place] OFFICE PROGRESS NOTE  Prince Solian, MD [place] [address]  DIAGNOSIS: Stage IV (T2a, N0, M1c) non-small cell lung cancer, poorly differentiated carcinoma with rhabdoid features presented with right upper lobe lung mass in addition to bilateral pulmonary nodules in addition to liver, innumerable brain metastasis and bone metastasis in the right acetabulum diagnosed in June 2021.  Biomarker Findings Microsatellite status - Cannot Be Determined Tumor Mutational Burden - 8 Muts/Mb Genomic Findings For a complete list of the genes assayed, please refer to the Appendix. BRCA2 [id number]* KRAS G12D TERT promoter -124C>T TP53 R158L 7 Disease relevant genes with no reportable alterations: ALK, BRAF, EGFR, ERBB2, MET, RET, ROS1  PD-L1 expression 100%  PRIOR THERAPY: Whole brain irradiation under the care of Dr. Lisbeth Renshaw expected to be completed on 09/09/2019.  CURRENT THERAPY: Hospice  INTERVAL HISTORY: Ryan Coffey 86 y.o. male returns to the clinic for a follow up visit accompanied by his daughter. The patient recently was diagnosed with metastatic carcinoma. He completed whole brain for the innumerable brain metastases. He recently went home after being in a skilled nursing facility. He is currently being evaluated to see if he qualifies for home health assistance/aid. He was independent at home prior to his recent diagnosis.   Today, the patient's main concern is related to a rheumatoid arthritis flare up. He was previously treated with methotrexate but this was discontinued after a recent hospitalization. He is also having significant pain due to the RA. He is also reporting bone pain secondary to the metastatic osseous lesions. He is prescribed oxycodone for his pain which he sometimes takes but he mostly has been taking tylenol. He denies any fevers, chills, or night sweats. He has been eating well since being on steroids; however, despite this,  he has lost 3 lbs since his last appointment. He denies chest pain but reports baseline shortness of breath with exertion with mild cough which produces brownish sputum. He has no nausea, vomiting, diarrhea or constipation. Denies headaches or visual changes. His hair is starting to fall out from the radiation treatment. The patient had been thinking about hospice/palliative care. However, the patient recently had molecular studies performed to see if he is a candidate for targeted treatment. He is here for evaluation and to discuss what his recommended treatment options are before making a decision.   MEDICAL HISTORY: Past Medical History:  Diagnosis Date  . Arthritis   . Benign tumor of pituitary gland ([facility])    "wasn't able to get it all"  . COPD (chronic obstructive pulmonary disease) ([facility])   . Pneumonia 06/27/11   "first time"  . Shingles     ALLERGIES:  has No Known Allergies.  MEDICATIONS:  Current Outpatient Medications  Medication Sig Dispense Refill  . acetaminophen (TYLENOL) 325 MG tablet Take 650 mg by mouth 2 (two) times daily as needed (arthritis).    . ALPRAZolam (XANAX) 0.5 MG tablet Take 0.5 mg by mouth daily as needed.    . bromocriptine (PARLODEL) 2.5 MG tablet Take 2.5 mg by mouth 2 (two) times daily.    Marland Kitchen dexamethasone (DECADRON) 2 MG tablet Take 4 mg by mouth 2 (two) times daily.    Marland Kitchen dexamethasone (DECADRON) 4 MG tablet Take 1 tablet (4 mg total) by mouth every 12 (twelve) hours.    . feeding supplement, ENSURE ENLIVE, (ENSURE ENLIVE) LIQD Take 237 mLs by mouth 2 (two) times daily between meals. 259 mL 12  . folic acid (FOLVITE)  1 MG tablet TAKE 2 TABLETS EVERY DAY (Patient taking differently: Take 2 mg by mouth daily. ) 180 tablet 3  . guaiFENesin-dextromethorphan (ROBITUSSIN DM) 100-10 MG/5ML syrup Take 10 mLs by mouth every 4 (four) hours as needed for cough. 236 mL 0  . hydrocortisone (CORTEF) 20 MG tablet Take 10-20 mg by mouth daily. 20 mg in the morning and '10mg'$   at night  2  . levothyroxine (SYNTHROID) 150 MCG tablet Take 150 mcg by mouth daily before breakfast.     . nystatin (MYCOSTATIN) 100000 UNIT/ML suspension Take 5 mLs by mouth 4 (four) times daily.    Marland Kitchen oxyCODONE (OXY IR/ROXICODONE) 5 MG immediate release tablet Take 1 tablet (5 mg total) by mouth every 6 (six) hours as needed for severe pain. 30 tablet 0  . pantoprazole (PROTONIX) 40 MG tablet Take 1 tablet (40 mg total) by mouth daily. 30 tablet 3  . polyethylene glycol (MIRALAX / GLYCOLAX) 17 g packet Take 17 g by mouth daily as needed for moderate constipation or severe constipation. 14 each 0  . pravastatin (PRAVACHOL) 20 MG tablet Take 20 mg by mouth daily.     Marland Kitchen senna-docusate (SENOKOT-S) 8.6-50 MG tablet Take 2 tablets by mouth at bedtime as needed for mild constipation.    Marland Kitchen testosterone cypionate (DEPOTESTOSTERONE CYPIONATE) 200 MG/ML injection Inject 75 mg into the muscle every 14 (fourteen) days.   5  . zolpidem (AMBIEN CR) 6.25 MG CR tablet Take 1 tablet (6.25 mg total) by mouth at bedtime as needed for sleep. (Patient taking differently: Take 6.25 mg by mouth at bedtime. ) 10 tablet 0   No current facility-administered medications for this visit.    SURGICAL HISTORY:  Past Surgical History:  Procedure Laterality Date  . CHOLECYSTECTOMY OPEN  2010  . IR US GUIDE BX ASP/DRAIN  08/25/2019  . KIDNEY SURGERY  2011   "lesion removed right side"  . RENAL BIOPSY  2012   right  . TRANSPHENOIDAL / TRANSNASAL HYPOPHYSECTOMY / RESECTION PITUITARY TUMOR  ~ 1979   "they couldn't get it all"    REVIEW OF SYSTEMS:   Review of Systems  Constitutional: Positive for fatigue, generalized weakness, and weight loss. Negative for appetite change, chills, and fever.  HENT: Negative for mouth sores, nosebleeds, sore throat and trouble swallowing.   Eyes: Negative for eye problems and icterus.  Respiratory: Positive for mild shortness of breath, cough, and blood tinged sputum. Negative for  wheezing.   Cardiovascular: Negative for chest pain and leg swelling.  Gastrointestinal: Negative for abdominal pain, constipation, diarrhea, nausea and vomiting.  Genitourinary: Negative for bladder incontinence, difficulty urinating, dysuria, frequency and hematuria.   Musculoskeletal: Positive for flare of up RA, especially in left hand. Positive for back pain. Negative for gait problem, neck pain and neck stiffness.  Skin: Positive for rash on scalp.  Neurological: Negative for dizziness, extremity weakness, gait problem, headaches, light-headedness and seizures.  Hematological: Negative for adenopathy. Does not bruise/bleed easily.  Psychiatric/Behavioral: Negative for confusion, depression and sleep disturbance. The patient is not nervous/anxious.     PHYSICAL EXAMINATION:  Blood pressure 105/73, pulse (!) 107, temperature 99.1 F (37.3 C), temperature source Temporal, resp. rate 17, height '5\' 8"'$  (1.727 m), weight 125 lb (56.7 kg), SpO2 98 %.  ECOG PERFORMANCE STATUS: 2 - Symptomatic, <50% confined to bed  Physical Exam  Constitutional: Oriented to person, place, and time and thin appearing male and in no distress.  HENT:  Head: Normocephalic and atraumatic.  Mouth/Throat:  Oropharynx is clear and moist. No oropharyngeal exudate.  Eyes: Conjunctivae are normal. Right eye exhibits no discharge. Left eye exhibits no discharge. No scleral icterus.  Neck: Normal range of motion. Neck supple.  Cardiovascular: Normal rate, regular rhythm, normal heart sounds and intact distal pulses.   Pulmonary/Chest: Effort normal and breath sounds normal. No respiratory distress. No wheezes. No rales.  Abdominal: Soft. Bowel sounds are normal. Exhibits no distension and no mass. There is no tenderness.  Musculoskeletal: Significant deviation of the left wrist/fingers due to RA. Normal range of motion. Exhibits no edema.  Lymphadenopathy:    No cervical adenopathy.  Neurological: Alert and oriented to  person, place, and time. Muscle wasting noted. Skin: Skin is warm and dry. Rash on scalp with associated bleeding. Not diaphoretic. No erythema. No pallor.  Psychiatric: Mood, memory and judgment normal.  Vitals reviewed.  LABORATORY DATA: Lab Results  Component Value Date   WBC 15.1 (H) 09/16/2019   HGB 13.2 09/16/2019   HCT 40.8 09/16/2019   MCV 95.1 09/16/2019   PLT 162 09/16/2019      Chemistry      Component Value Date/Time   NA 140 09/16/2019 1443   K 3.8 09/16/2019 1443   CL 104 09/16/2019 1443   CO2 27 09/16/2019 1443   BUN 27 (H) 09/16/2019 1443   CREATININE 0.81 09/16/2019 1443   CREATININE 0.75 08/12/2019 1001      Component Value Date/Time   CALCIUM 8.6 (L) 09/16/2019 1443   ALKPHOS 82 09/16/2019 1443   AST 15 09/16/2019 1443   ALT 19 09/16/2019 1443   BILITOT 0.7 09/16/2019 1443       RADIOGRAPHIC STUDIES:  DG Chest 2 View  Result Date: 08/24/2019 CLINICAL DATA:  Fever.  Probable lung cancer. EXAM: CHEST - 2 VIEW COMPARISON:  CT of the chest, abdomen, and pelvis 08/20/2019. FINDINGS: Heart size normal. Atherosclerotic changes are present at the aortic arch. Right upper lobe spiculated mass in perihilar opacities are again noted. No new airspace disease is present. IMPRESSION: 1. Stable right upper lobe mass and perihilar opacities. 2. No new airspace disease. 3. Atherosclerosis. Electronically Signed   By: San Morelle M.D.   On: 08/24/2019 07:03   DG Chest 2 View  Result Date: 08/20/2019 CLINICAL DATA:  Cough, left rib pain for 2 weeks EXAM: CHEST - 2 VIEW COMPARISON:  10/07/2018 FINDINGS: Frontal and lateral views of the chest demonstrate a stable cardiac silhouette. Persistent areas of scarring are seen within the superior segment right lower lobe. No new airspace disease, effusion, or pneumothorax. Significant background emphysema. Severe right shoulder osteoarthritis. IMPRESSION: 1. Stable scarring within the right lower lobe. 2. Emphysema. 3. No  acute process. Electronically Signed   By: Randa Ngo M.D.   On: 08/20/2019 16:32   DG Ribs Unilateral Left  Result Date: 08/20/2019 CLINICAL DATA:  Cough, left rib pain for 2 weeks EXAM: LEFT RIBS - 2 VIEW COMPARISON:  10/07/2018 FINDINGS: Frontal and oblique views of the left thoracic cage are obtained. There are no acute displaced rib fractures. Visualized portions of the left lung are clear. Chronic scarring within the right lower lobe again noted. IMPRESSION: 1. No acute displaced fracture. Electronically Signed   By: Randa Ngo M.D.   On: 08/20/2019 16:34   CT Head Wo Contrast  Result Date: 08/20/2019 CLINICAL DATA:  Slurred speech and left facial droop.  Lethargy. EXAM: CT HEAD WITHOUT CONTRAST TECHNIQUE: Contiguous axial images were obtained from the base of the skull  through the vertex without intravenous contrast. COMPARISON:  None. FINDINGS: Brain: Multiple brain masses disseminated throughout the cerebellum and both cerebral hemispheres consistent with advanced intracranial metastatic disease. Most of these are associated with petechial blood products. No frank hematoma. Many of the lesions are associated with vasogenic edema. There is no midline shift. No hydrocephalus or extra-axial collection. Index left cerebellar lesion image 8 measures 14 mm. Index right temporal lesion image 13 measures 28 mm. Index left occipital lesion image 16 measures 23 mm. Index left frontal lesion image 16 measures 28 mm. Index right frontoparietal lesion image 20 for measures 17 mm. Vascular: There is atherosclerotic calcification of the major vessels at the base of the brain. Skull: Previous trans-sphenoidal surgery. No lytic calvarial lesion. Sinuses/Orbits: Clear/normal Other: None IMPRESSION: Advanced widespread metastatic disease throughout the brain. In total, there are probably 40-50 lesions. Most are associated with petechial blood products. Many are associated with vasogenic edema. No midline  shift. No hydrocephalus. Lung cancer is the most common primary tumor to present with this pattern. Distant trans-sphenoidal pituitary surgery. Electronically Signed   By: Paulina Fusi M.D.   On: 08/20/2019 15:48   CT Chest W Contrast  Result Date: 08/20/2019 CLINICAL DATA:  Altered level of consciousness, diffuse intracranial metastases with unknown primary malignancy EXAM: CT CHEST, ABDOMEN, AND PELVIS WITH CONTRAST TECHNIQUE: Multidetector CT imaging of the chest, abdomen and pelvis was performed following the standard protocol during bolus administration of intravenous contrast. CONTRAST:  OMNIPAQUE IOHEXOL 300 MG/ML  SOLN COMPARISON:  09/22/2018 FINDINGS: CT CHEST FINDINGS Cardiovascular: Heart and great vessels are unremarkable without pericardial effusion. Moderate atherosclerosis of the aorta and coronary vessels. Mediastinum/Nodes: No enlarged mediastinal, hilar, or axillary lymph nodes. Thyroid gland, trachea, and esophagus demonstrate no significant findings. Lungs/Pleura: Extensive background emphysema again noted. The cavitating pneumonia within the right lower lobe on prior study has resolved, with resulting scarring within the superior segment right lower lobe. There is a new rounded somewhat spiculated mass within the periphery of the right upper lobe, measuring 2.6 x 3.8 cm on image 26 of series 2. No effusion or pneumothorax. Central airways are patent. Musculoskeletal: There are no acute or destructive bony lesions. Reconstructed images demonstrate no additional findings. CT ABDOMEN PELVIS FINDINGS Hepatobiliary: There is decreased attenuation throughout the majority of the liver parenchyma consistent with hepatic steatosis. Higher attenuation within segment 7 likely reflects fatty sparing. No mass effect. The gallbladder is surgically absent. Pancreas: Unremarkable. No pancreatic ductal dilatation or surrounding inflammatory changes. Spleen: Normal in size without focal abnormality.  Adrenals/Urinary Tract: 5 mm nonobstructing right renal calculus. There are numerous bilateral renal cortical cysts. The adrenals are unremarkable. Bladder is decompressed, which limits evaluation. Stomach/Bowel: No bowel obstruction or ileus. Diffuse diverticulosis of the distal colon without diverticulitis. No bowel wall thickening or inflammatory change. Vascular/Lymphatic: Extensive atherosclerosis of the abdominal aorta. There is aneurysmal dilatation of the right common iliac artery measuring up to 2.8 cm. No pathologic adenopathy. Reproductive: Prostate is unremarkable. Other: No free fluid or free gas. No abdominal wall hernia. Musculoskeletal: No acute or destructive bony lesions. Reconstructed images demonstrate no additional findings. IMPRESSION: 1. New rounded somewhat spiculated mass within the periphery of the right upper lobe, measuring 2.6 x 3.8 cm, concerning for malignancy. PET-CT may be useful for further evaluation. 2. Resolution of the previously seen cavitating pneumonia within the right lower lobe, with resulting scarring. 3. Hepatic steatosis, with fatty sparing in the posterior right lobe. 4. A 5 mm nonobstructing right renal  calculus. 5. Aneurysmal dilatation of the right common iliac artery measuring up to 2.8 cm. 6. Aortic Atherosclerosis (ICD10-I70.0) and Emphysema (ICD10-J43.9). Electronically Signed   By: Randa Ngo M.D.   On: 08/20/2019 23:28   MR ABDOMEN W WO CONTRAST  Result Date: 08/25/2019 CLINICAL DATA:  86 year old male with history of abdominal mass. Suspected neoplasm. EXAM: MRI ABDOMEN WITHOUT AND WITH CONTRAST TECHNIQUE: Multiplanar multisequence MR imaging of the abdomen was performed both before and after the administration of intravenous contrast. CONTRAST:  76m GADAVIST GADOBUTROL 1 MMOL/ML IV SOLN COMPARISON:  No prior abdominal MRI. CT the abdomen and pelvis 08/20/2019. FINDINGS: Lower chest: Unremarkable. Hepatobiliary: In the inferior aspect of segment 5 of  the liver (axial image 56 of series 24) there is a 1.4 x 1.4 cm T1 hypointense, T2 iso to slightly hyperintense lesion which demonstrates some low-level internal enhancement, concerning for neoplasm. In segment 3 of the liver (axial image 43 of series 24) there is a subtle T1 hypointense, T2 isointense lesion which does not demonstrate definitive internal enhancement on post gadolinium images (although assessment is limited by the small size of the lesion and motion artifact which affects subtraction imaging). Status post cholecystectomy. No intrahepatic biliary ductal dilatation. Common bile duct measures 6 mm in the porta hepatis. However, the common hepatic duct is mildly dilated measuring up to 14 mm in diameter, and there multiple filling defects in the common hepatic duct, compatible with choledocholithiasis, largest of which measures up to 11 mm (axial image 19 of series 25). Pancreas: No pancreatic mass. No pancreatic ductal dilatation. No pancreatic or peripancreatic fluid collections or inflammatory changes. Spleen:  Unremarkable. Adrenals/Urinary Tract: There are multiple T1 hypointense, T2 hyperintense, nonenhancing lesions associated with both kidneys, largest of which is exophytic extending off the lower pole of the right kidney measuring up to 4.8 x 4.1 cm in diameter, compatible with simple cysts. No suspicious renal lesions are noted. No hydroureteronephrosis in the visualized portions of the abdomen. Bilateral adrenal glands are normal in appearance. Stomach/Bowel: Visualized portions are unremarkable. Vascular/Lymphatic: Aortic atherosclerosis, with aneurysmal dilatation of the right common iliac artery which measures up to 2.6 cm in diameter. No lymphadenopathy noted in the abdomen. Other: No significant volume of ascites noted in the visualized portions of the peritoneal cavity. Musculoskeletal: No aggressive appearing osseous lesions are noted in the visualized portions of the skeleton.  IMPRESSION: 1. There are 2 liver lesions. One of these, in the inferior aspect of segment 5 of the liver, has imaging characteristics concerning for malignancy, potentially a metastatic lesion. Other lesion in segment 3 is too small to definitively characterize, but does not appear to be a simple cyst or other clearly benign lesion. 2. Status post cholecystectomy with choledocholithiasis in the common hepatic duct. Although the common hepatic duct is mildly dilated, there is no other intrahepatic biliary ductal dilatation. Common bile duct is normal in caliber measuring 6 mm in the porta hepatis. 3. Aortic atherosclerosis, as well as aneurysmal dilatation of the right common iliac artery which measures up to 2.6 cm in diameter. e Electronically Signed   By: DVinnie LangtonM.D.   On: 08/25/2019 08:26   CT Abdomen Pelvis W Contrast  Result Date: 08/20/2019 CLINICAL DATA:  Altered level of consciousness, diffuse intracranial metastases with unknown primary malignancy EXAM: CT CHEST, ABDOMEN, AND PELVIS WITH CONTRAST TECHNIQUE: Multidetector CT imaging of the chest, abdomen and pelvis was performed following the standard protocol during bolus administration of intravenous contrast. CONTRAST:  1085mOMNIPAQUE  IOHEXOL 300 MG/ML  SOLN COMPARISON:  09/22/2018 FINDINGS: CT CHEST FINDINGS Cardiovascular: Heart and great vessels are unremarkable without pericardial effusion. Moderate atherosclerosis of the aorta and coronary vessels. Mediastinum/Nodes: No enlarged mediastinal, hilar, or axillary lymph nodes. Thyroid gland, trachea, and esophagus demonstrate no significant findings. Lungs/Pleura: Extensive background emphysema again noted. The cavitating pneumonia within the right lower lobe on prior study has resolved, with resulting scarring within the superior segment right lower lobe. There is a new rounded somewhat spiculated mass within the periphery of the right upper lobe, measuring 2.6 x 3.8 cm on image 26 of  series 2. No effusion or pneumothorax. Central airways are patent. Musculoskeletal: There are no acute or destructive bony lesions. Reconstructed images demonstrate no additional findings. CT ABDOMEN PELVIS FINDINGS Hepatobiliary: There is decreased attenuation throughout the majority of the liver parenchyma consistent with hepatic steatosis. Higher attenuation within segment 7 likely reflects fatty sparing. No mass effect. The gallbladder is surgically absent. Pancreas: Unremarkable. No pancreatic ductal dilatation or surrounding inflammatory changes. Spleen: Normal in size without focal abnormality. Adrenals/Urinary Tract: 5 mm nonobstructing right renal calculus. There are numerous bilateral renal cortical cysts. The adrenals are unremarkable. Bladder is decompressed, which limits evaluation. Stomach/Bowel: No bowel obstruction or ileus. Diffuse diverticulosis of the distal colon without diverticulitis. No bowel wall thickening or inflammatory change. Vascular/Lymphatic: Extensive atherosclerosis of the abdominal aorta. There is aneurysmal dilatation of the right common iliac artery measuring up to 2.8 cm. No pathologic adenopathy. Reproductive: Prostate is unremarkable. Other: No free fluid or free gas. No abdominal wall hernia. Musculoskeletal: No acute or destructive bony lesions. Reconstructed images demonstrate no additional findings. IMPRESSION: 1. New rounded somewhat spiculated mass within the periphery of the right upper lobe, measuring 2.6 x 3.8 cm, concerning for malignancy. PET-CT may be useful for further evaluation. 2. Resolution of the previously seen cavitating pneumonia within the right lower lobe, with resulting scarring. 3. Hepatic steatosis, with fatty sparing in the posterior right lobe. 4. A 5 mm nonobstructing right renal calculus. 5. Aneurysmal dilatation of the right common iliac artery measuring up to 2.8 cm. 6. Aortic Atherosclerosis (ICD10-I70.0) and Emphysema (ICD10-J43.9).  Electronically Signed   By: Randa Ngo M.D.   On: 08/20/2019 23:28   NM PET Image Initial (PI) Skull Base To Thigh  Result Date: 08/31/2019 CLINICAL DATA:  Initial treatment strategy for lung mass. EXAM: NUCLEAR MEDICINE PET SKULL BASE TO THIGH TECHNIQUE: 6.76 mCi F-18 FDG was injected intravenously. Full-ring PET imaging was performed from the skull base to thigh after the radiotracer. CT data was obtained and used for attenuation correction and anatomic localization. Fasting blood glucose: 124 mg/dl COMPARISON:  CT chest, abdomen, and pelvis 08/20/2019 FINDINGS: Mediastinal blood pool activity: SUV max 2.51 Liver activity: SUV max NA NECK: No hypermetabolic lymph nodes in the neck. Incidental CT findings: none CHEST: No FDG avid axillary, supraclavicular, mediastinal or hilar lymph nodes. Within the periphery of the right upper lobe there is a spiculated mass measuring 3.5 x 2.1 cm with SUV max of 8.54. Airspace consolidation involving the superior segment of right lower lobe with mild diffuse tracer uptake has an SUV max of 4.50 Subpleural nodule within the superior segment of left lower lobe measures 6 mm within SUV max of 3.15. 4 mm anteromedial left upper lobe lung nodule has an SUV max of 1.24. Scattered calcified granulomas are identified bilaterally. Incidental CT findings: Moderate to advanced changes of emphysema. Aortic atherosclerosis. Three vessel coronary artery atherosclerotic calcifications. ABDOMEN/PELVIS: Inferior right hepatic  lobe low-attenuation lesion measures 1.5 cm and has an SUV max of 8.95. No abnormal uptake identified within the pancreas, spleen or adrenal glands. New no hypermetabolic abdominal or pelvic lymph nodes. Incidental CT findings: Aortic atherosclerosis. Infrarenal abdominal aorta is ectatic measuring 2.7 cm. Bilateral kidney cysts. Inferior pole of right kidney stone measures 0.4 cm, image, image 121/4. Aneurysmal dilatation of the right common iliac artery measures  2.8 cm. SKELETON: FDG avid metastasis within the posterior column of right acetabulum measures 1.8 cm within SUV max of 6.09. Increased radiotracer uptake localizing to the lateral aspect of the left eighth and ninth ribs is likely posttraumatic. Incidental CT findings: Advanced thoracolumbar degenerative disc disease with first degree anterolisthesis of L4 on L5. IMPRESSION: 1. Right upper lobe lung mass is hypermetabolic compatible with primary bronchogenic carcinoma. 2. 2 small nodules within the left lung also exhibit FDG uptake. The largest in the superior segment of left lower lobe has an SUV max of 3.15 and is concerning for metastasis. The smaller nodule is technically too small to reliably characterize measuring 4 mm within SUV max of 124. 3. FDG avid right hepatic lobe metastasis. 4. FDG avid posterior column of right acetabulum bone metastases. 5. Airspace consolidation involving the superior segment of right lower lobe is noted and is favored to represent sequelae of pneumonia. 6. Posttraumatic left lower lateral rib deformities. 7. Aortic Atherosclerosis (ICD10-I70.0) and Emphysema (ICD10-J43.9). Coronary artery calcifications. Electronically Signed   By: Kerby Moors M.D.   On: 08/31/2019 16:00   IR US Guide Bx Asp/Drain  Result Date: 08/25/2019 INDICATION: Remote history of renal cell carcinoma, now with findings worrisome for metastatic lung cancer. Please perform ultrasound-guided biopsy indeterminate liver lesion for tissue diagnostic purposes. EXAM: ULTRASOUND GUIDED LIVER LESION BIOPSY COMPARISON:  Abdominal MRI-08/24/2019; CT of the chest, abdomen and pelvis-08/20/2019 MEDICATIONS: None ANESTHESIA/SEDATION: Fentanyl 100 mcg IV; Versed 2 mg IV Total Moderate Sedation time:  10 Minutes. The patient's level of consciousness and vital signs were monitored continuously by radiology nursing throughout the procedure under my direct supervision. COMPLICATIONS: None immediate. PROCEDURE: Informed  written consent was obtained from the patient after a discussion of the risks, benefits and alternatives to treatment. The patient understands and consents the procedure. A timeout was performed prior to the initiation of the procedure. Ultrasound scanning was performed of the right upper abdominal quadrant demonstrates an approximately 1.6 x 1.6 cm mixed echogenic nodule within the subcapsular caudal aspect of the right lobe of the liver correlating with the lesion seen on abdominal MRI image 56, series 24. The procedure was planned. The right upper abdominal quadrant was prepped and draped in the usual sterile fashion. The overlying soft tissues were anesthetized with 1% lidocaine with epinephrine. A 17 gauge, 6.8 cm co-axial needle was advanced into a peripheral aspect of the lesion. This was followed by 5 core biopsies with an 18 gauge core device under direct ultrasound guidance. The coaxial needle tract was embolized with a small amount of Gel-Foam slurry and superficial hemostasis was obtained with manual compression. Post procedural scanning was negative for definitive area of hemorrhage or additional complication. A dressing was placed. The patient tolerated the procedure well without immediate post procedural complication. IMPRESSION: Technically successful ultrasound guided core needle biopsy of indeterminate lesion within the caudal aspect of the right lobe of the liver. Electronically Signed   By: Sandi Mariscal M.D.   On: 08/25/2019 12:01   DG CHEST PORT 1 VIEW  Result Date: 08/26/2019 CLINICAL DATA:  Shortness  of breath. EXAM: PORTABLE CHEST 1 VIEW COMPARISON:  Chest x-ray dated August 24, 2019. FINDINGS: Stable cardiomediastinal silhouette with normal heart size. Normal pulmonary vascularity. Unchanged peripheral right upper lobe mass. No focal consolidation, pleural effusion, or pneumothorax. Emphysematous changes. No acute osseous abnormality. IMPRESSION: 1. No active disease. 2. Unchanged  peripheral right upper lobe mass. Electronically Signed   By: Titus Dubin M.D.   On: 08/26/2019 09:11     ASSESSMENT/PLAN:  This is a very pleasant 86 year old Caucasian male recently diagnosed with a stage IV (T2 a, N0, M1 C) non-small cell lung cancer, poorly differentiated carcinoma with rhabdoid feature presented with right upper lobe lung mass in addition to bilateral pulmonary nodules as well as innumerable brain metastasis, liver and bone metastasis diagnosed in June 2021. His PDL1 expression is 100%. His molecular studies show no actionable mutations.   The patient recently completed whole brain radaition under the care of Dr. Lisbeth Renshaw.   The patient was seen with [name]. [name] had a lengthly discusion with the patient abut his current condition and treatment options.  [name] explained to the patient that he has incurable condition and all the treatment will be of palliative nature. The patient's PDL1 expression is 100% which would make the patient typically a good candidate for single agent immunotherapy with Keytruda 200 mg IV every 3 weeks; however, he has significant rheumatoid arthritis. He is currently having a flare up. Immunotherapy can exacerbate RA which would not make him a good canidate for immunotherapy. He is not a candidate for systemic chemotherapy. Additionally, the patient is not interested in chemotherapy. Therefore, [name] recommended palliative care/hospice. The patient is interested in proceeding with a referral to hospice.   The patient will reach out to his PCP to initiate the referral to hospice.   The patient was advised to call immediately if he has any concerning symptoms in the interval. The patient voices understanding of current disease status and treatment options and is in agreement with the current care plan. All questions were answered. The patient knows to call the clinic with any problems, questions or concerns. We can certainly see  the patient much sooner if necessary  No orders of the defined types were placed in this encounter.    Ryan Gaut L Raylon Lamson, PA-C 09/16/19   ADDENDUM: Hematology/Oncology Attending: I had a face-to-face encounter with the patient today.  I recommended his care plan.  This is a very pleasant 86 years old white male recently diagnosed with a stage IV non-small cell lung cancer, poorly differentiated carcinoma with rhabdoid features presented with right upper lobe lung mass in addition to bilateral pulmonary nodules and innumerable brain metastasis as well as liver and bone metastasis diagnosed in June 2021.  His PD-L1 expression was 100% but the molecular study shows no actionable mutations. The patient has severe rheumatoid arthritis and currently on a flare. I had a lengthy discussion with the patient and his daughters about his current disease stage, prognosis and treatment options.  I explained to the patient that he has incurable condition and all the treatment will be of palliative nature. We discussed with the patient his treatment options including palliative care and hospice versus treatment with systemic chemotherapy.  He could also benefit from treatment with immunotherapy but he will be at high risk for flare of the rheumatoid arthritis. After a lengthy discussion the patient and his family are not interested in proceeding with any systemic therapy at this point and  they would like to consider palliative care and hospice.  We will reach out to Dr. Dagmar Hait his primary care physician to order the hospice referral as the patient has many other medical issues and comorbidities that will need his experience.  During his hospice care. We will see the patient on as-needed basis at this point. The patient was advised to call if he has any other concerning issues in the interval.  Disclaimer: This note was dictated with voice recognition software. Similar sounding words can inadvertently be  transcribed and may be missed upon review. Ryan Macmurray L Jebadiah Imperato, PA-C 09/16/19

## 2019-09-16 ENCOUNTER — Inpatient Hospital Stay (HOSPITAL_BASED_OUTPATIENT_CLINIC_OR_DEPARTMENT_OTHER): Payer: No Typology Code available for payment source | Admitting: Physician Assistant

## 2019-09-16 ENCOUNTER — Other Ambulatory Visit: Payer: Self-pay

## 2019-09-16 ENCOUNTER — Telehealth: Payer: Self-pay | Admitting: *Deleted

## 2019-09-16 ENCOUNTER — Inpatient Hospital Stay: Payer: No Typology Code available for payment source

## 2019-09-16 VITALS — BP 105/73 | HR 107 | Temp 99.1°F | Resp 17 | Ht 68.0 in | Wt 125.0 lb

## 2019-09-16 DIAGNOSIS — M059 Rheumatoid arthritis with rheumatoid factor, unspecified: Secondary | ICD-10-CM | POA: Diagnosis not present

## 2019-09-16 DIAGNOSIS — M069 Rheumatoid arthritis, unspecified: Secondary | ICD-10-CM | POA: Diagnosis not present

## 2019-09-16 DIAGNOSIS — Z87891 Personal history of nicotine dependence: Secondary | ICD-10-CM | POA: Diagnosis not present

## 2019-09-16 DIAGNOSIS — C7951 Secondary malignant neoplasm of bone: Secondary | ICD-10-CM | POA: Diagnosis not present

## 2019-09-16 DIAGNOSIS — C787 Secondary malignant neoplasm of liver and intrahepatic bile duct: Secondary | ICD-10-CM | POA: Diagnosis not present

## 2019-09-16 DIAGNOSIS — C7931 Secondary malignant neoplasm of brain: Secondary | ICD-10-CM | POA: Diagnosis not present

## 2019-09-16 DIAGNOSIS — C3411 Malignant neoplasm of upper lobe, right bronchus or lung: Secondary | ICD-10-CM | POA: Diagnosis not present

## 2019-09-16 DIAGNOSIS — C7801 Secondary malignant neoplasm of right lung: Secondary | ICD-10-CM

## 2019-09-16 DIAGNOSIS — G893 Neoplasm related pain (acute) (chronic): Secondary | ICD-10-CM | POA: Diagnosis not present

## 2019-09-16 DIAGNOSIS — Z7189 Other specified counseling: Secondary | ICD-10-CM

## 2019-09-16 LAB — CBC WITH DIFFERENTIAL (CANCER CENTER ONLY)
Abs Immature Granulocytes: 0.2 10*3/uL — ABNORMAL HIGH (ref 0.00–0.07)
Basophils Absolute: 0 10*3/uL (ref 0.0–0.1)
Basophils Relative: 0 %
Eosinophils Absolute: 0 10*3/uL (ref 0.0–0.5)
Eosinophils Relative: 0 %
HCT: 40.8 % (ref 39.0–52.0)
Hemoglobin: 13.2 g/dL (ref 13.0–17.0)
Immature Granulocytes: 1 %
Lymphocytes Relative: 1 %
Lymphs Abs: 0.2 10*3/uL — ABNORMAL LOW (ref 0.7–4.0)
MCH: 30.8 pg (ref 26.0–34.0)
MCHC: 32.4 g/dL (ref 30.0–36.0)
MCV: 95.1 fL (ref 80.0–100.0)
Monocytes Absolute: 0.5 10*3/uL (ref 0.1–1.0)
Monocytes Relative: 3 %
Neutro Abs: 14.2 10*3/uL — ABNORMAL HIGH (ref 1.7–7.7)
Neutrophils Relative %: 95 %
Platelet Count: 162 10*3/uL (ref 150–400)
RBC: 4.29 MIL/uL (ref 4.22–5.81)
RDW: 15.9 % — ABNORMAL HIGH (ref 11.5–15.5)
WBC Count: 15.1 10*3/uL — ABNORMAL HIGH (ref 4.0–10.5)
nRBC: 0 % (ref 0.0–0.2)

## 2019-09-16 LAB — CMP (CANCER CENTER ONLY)
ALT: 19 U/L (ref 0–44)
AST: 15 U/L (ref 15–41)
Albumin: 2.8 g/dL — ABNORMAL LOW (ref 3.5–5.0)
Alkaline Phosphatase: 82 U/L (ref 38–126)
Anion gap: 9 (ref 5–15)
BUN: 27 mg/dL — ABNORMAL HIGH (ref 8–23)
CO2: 27 mmol/L (ref 22–32)
Calcium: 8.6 mg/dL — ABNORMAL LOW (ref 8.9–10.3)
Chloride: 104 mmol/L (ref 98–111)
Creatinine: 0.81 mg/dL (ref 0.61–1.24)
GFR, Est AFR Am: >60 mL/min (ref >60)
GFR, Estimated: >60 mL/min (ref >60)
Glucose, Bld: 140 mg/dL — ABNORMAL HIGH (ref 70–99)
Potassium: 3.8 mmol/L (ref 3.5–5.1)
Sodium: 140 mmol/L (ref 135–145)
Total Bilirubin: 0.7 mg/dL (ref 0.3–1.2)
Total Protein: 5.9 g/dL — ABNORMAL LOW (ref 6.5–8.1)

## 2019-09-16 NOTE — Telephone Encounter (Signed)
I spoke with Helen and his daughter Colletta [place].  I also reviewed his labs from today which were all within normal limits.  Patient is having a flare of arthritis.  He is done with the radiation therapy.  He is on palliative care now.  We discussed restarting methotrexate at his usual dose of 4 tablets twice a week.  I advised him to get lab work in 2 weeks and then in 2 months and then we can monitor labs every 3 months.

## 2019-09-16 NOTE — Telephone Encounter (Signed)
Patient was diagnosed with stage 4 lung cancer with mets to the brain, liver and bone. Patient had an appointment  with oncology today and they advised there is nothing more they can do. They recommended palliative care. Patient has been off MTX for a couple of weeks. Patient is having a flare with his RA. Patient increased pain in his hands. Patient's daughter would like to know if Ryan Coffey can restart on his MTX. Please advise.

## 2019-09-17 DIAGNOSIS — K219 Gastro-esophageal reflux disease without esophagitis: Secondary | ICD-10-CM | POA: Diagnosis not present

## 2019-09-17 DIAGNOSIS — B370 Candidal stomatitis: Secondary | ICD-10-CM | POA: Diagnosis not present

## 2019-09-17 DIAGNOSIS — E230 Hypopituitarism: Secondary | ICD-10-CM | POA: Diagnosis not present

## 2019-09-17 DIAGNOSIS — Z85528 Personal history of other malignant neoplasm of kidney: Secondary | ICD-10-CM | POA: Diagnosis not present

## 2019-09-17 DIAGNOSIS — C7931 Secondary malignant neoplasm of brain: Secondary | ICD-10-CM | POA: Diagnosis not present

## 2019-09-17 DIAGNOSIS — C7951 Secondary malignant neoplasm of bone: Secondary | ICD-10-CM | POA: Diagnosis not present

## 2019-09-17 DIAGNOSIS — R634 Abnormal weight loss: Secondary | ICD-10-CM | POA: Diagnosis not present

## 2019-09-17 DIAGNOSIS — J449 Chronic obstructive pulmonary disease, unspecified: Secondary | ICD-10-CM | POA: Diagnosis not present

## 2019-09-17 DIAGNOSIS — L89151 Pressure ulcer of sacral region, stage 1: Secondary | ICD-10-CM | POA: Diagnosis not present

## 2019-09-17 DIAGNOSIS — E039 Hypothyroidism, unspecified: Secondary | ICD-10-CM | POA: Diagnosis not present

## 2019-09-17 DIAGNOSIS — Z923 Personal history of irradiation: Secondary | ICD-10-CM | POA: Diagnosis not present

## 2019-09-17 DIAGNOSIS — C3411 Malignant neoplasm of upper lobe, right bronchus or lung: Secondary | ICD-10-CM | POA: Diagnosis not present

## 2019-09-17 DIAGNOSIS — R630 Anorexia: Secondary | ICD-10-CM | POA: Diagnosis not present

## 2019-09-17 DIAGNOSIS — J9691 Respiratory failure, unspecified with hypoxia: Secondary | ICD-10-CM | POA: Diagnosis not present

## 2019-09-17 DIAGNOSIS — E7849 Other hyperlipidemia: Secondary | ICD-10-CM | POA: Diagnosis not present

## 2019-09-17 DIAGNOSIS — R918 Other nonspecific abnormal finding of lung field: Secondary | ICD-10-CM | POA: Diagnosis not present

## 2019-09-17 DIAGNOSIS — E43 Unspecified severe protein-calorie malnutrition: Secondary | ICD-10-CM | POA: Diagnosis not present

## 2019-09-17 DIAGNOSIS — M069 Rheumatoid arthritis, unspecified: Secondary | ICD-10-CM | POA: Diagnosis not present

## 2019-09-17 DIAGNOSIS — G4700 Insomnia, unspecified: Secondary | ICD-10-CM | POA: Diagnosis not present

## 2019-09-17 DIAGNOSIS — R627 Adult failure to thrive: Secondary | ICD-10-CM | POA: Diagnosis not present

## 2019-09-17 DIAGNOSIS — C787 Secondary malignant neoplasm of liver and intrahepatic bile duct: Secondary | ICD-10-CM | POA: Diagnosis not present

## 2019-09-17 DIAGNOSIS — Z87891 Personal history of nicotine dependence: Secondary | ICD-10-CM | POA: Diagnosis not present

## 2019-09-17 DIAGNOSIS — E785 Hyperlipidemia, unspecified: Secondary | ICD-10-CM | POA: Diagnosis not present

## 2019-09-17 DIAGNOSIS — D473 Essential (hemorrhagic) thrombocythemia: Secondary | ICD-10-CM | POA: Diagnosis not present

## 2019-09-17 DIAGNOSIS — F419 Anxiety disorder, unspecified: Secondary | ICD-10-CM | POA: Diagnosis not present

## 2019-09-18 ENCOUNTER — Other Ambulatory Visit: Payer: Self-pay | Admitting: *Deleted

## 2019-09-18 DIAGNOSIS — Z85528 Personal history of other malignant neoplasm of kidney: Secondary | ICD-10-CM | POA: Diagnosis not present

## 2019-09-18 DIAGNOSIS — C787 Secondary malignant neoplasm of liver and intrahepatic bile duct: Secondary | ICD-10-CM | POA: Diagnosis not present

## 2019-09-18 DIAGNOSIS — C7951 Secondary malignant neoplasm of bone: Secondary | ICD-10-CM | POA: Diagnosis not present

## 2019-09-18 DIAGNOSIS — C3411 Malignant neoplasm of upper lobe, right bronchus or lung: Secondary | ICD-10-CM | POA: Diagnosis not present

## 2019-09-18 DIAGNOSIS — R918 Other nonspecific abnormal finding of lung field: Secondary | ICD-10-CM | POA: Diagnosis not present

## 2019-09-18 DIAGNOSIS — C7931 Secondary malignant neoplasm of brain: Secondary | ICD-10-CM | POA: Diagnosis not present

## 2019-09-18 NOTE — Patient Outreach (Signed)
[facility] Post- Acute Care Coordinator follow up for potential [facility] Care Management services.   Telephone call made to Vevelyn Francois (daughter/ DPR). Ivin Booty confirms Mr. Zink has now enrolled in hospice services with Manufacturing [facility]. Expresses appreciation of the call.   No identifiable [facility] Care Management needs.    Marthenia Rolling, MSN-Ed, RN,BSN [facility] Acute Care Coordinator [phone] [facility]) [phone]  (Toll free office)

## 2019-09-19 ENCOUNTER — Other Ambulatory Visit: Payer: Self-pay | Admitting: Rheumatology

## 2019-09-21 DIAGNOSIS — C787 Secondary malignant neoplasm of liver and intrahepatic bile duct: Secondary | ICD-10-CM | POA: Diagnosis not present

## 2019-09-21 DIAGNOSIS — C7951 Secondary malignant neoplasm of bone: Secondary | ICD-10-CM | POA: Diagnosis not present

## 2019-09-21 DIAGNOSIS — C7931 Secondary malignant neoplasm of brain: Secondary | ICD-10-CM | POA: Diagnosis not present

## 2019-09-21 DIAGNOSIS — R918 Other nonspecific abnormal finding of lung field: Secondary | ICD-10-CM | POA: Diagnosis not present

## 2019-09-21 DIAGNOSIS — Z85528 Personal history of other malignant neoplasm of kidney: Secondary | ICD-10-CM | POA: Diagnosis not present

## 2019-09-21 DIAGNOSIS — C3411 Malignant neoplasm of upper lobe, right bronchus or lung: Secondary | ICD-10-CM | POA: Diagnosis not present

## 2019-09-21 NOTE — Telephone Encounter (Signed)
Last Visit: 05/26/2019 Next Visit: 10/28/2019 Labs: 09/16/2019 Glucose 140, BUN 27, Calcium 8.6, Total Protein 5.9, Albumin 2.8, WBC 15.1, RDW 15.9, Neutro Abs 14.2 Lymphs Abs 0.2  Current Dose per office note on 05/26/2019: Methotrexate 2.5 mg 4 tablets on Saturday and 4 tablets on Sunday  DX: Seropositive rheumatoid arthritis   Okay to refill MTX?

## 2019-09-21 NOTE — Progress Notes (Signed)
COMMUNITY PALLIATIVE CARE SW NOTE  PATIENT NAME: Ryan Coffey DOB: 12/23/1932 MRN: [id number]  PRIMARY CARE PROVIDER: Prince Solian, MD  RESPONSIBLE PARTY:  Acct ID - Guarantor Home Phone Work Phone Relationship Acct Type  [id number] - Brigance,[phone]  Self P/F     [address], [place], [place] [address]     PLAN OF CARE and INTERVENTIONS:             1. GOALS OF CARE/ ADVANCE CARE PLANNING:  Goal of care is for patient to return to his home and maintain is independence. Patient is a DNR. 2. SOCIAL/EMOTIONAL/SPIRITUAL ASSESSMENT/ INTERVENTIONS:  SW completed a face-to-face visit with patient at the facility ([facility]) where is currently receiving rehab. Patient was in bed, awake and watching TV when SW arrived. SW provided education regarding the palliative care program, role in his care, visit frequency and how support can be accessed. Patient has be at [facility] for three weeks. Patient stated that he found out that he had cancer and is still in shock over the diagnosis. He felt that he was healthy and often exercised. He tearfully shared with SW what his life was like before this diagnosis, he was involved in social activities, leading groups and organizations and now he does not know what the future holds for him.  Plan stated the goal is to receive rehab and return to his home with his daughter as caregiver. Patient currently has thrush and has a restricted diet. He is also receiving occupation and physical therapy. Patient is not sleeping well and his Ambien was increased. Patient was able to provide some background information on himself. Patient was born in [place], [place]. He graduated from [employer]. 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 Patient retired from [employer]. He has widowed since November 2020. He has two daughters: Colletta [place] and Ivin Booty. He enjoys horses. 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 SW  provided supportive presence, active and reflective listening, supportive and anticipatory grief counseling, normalized feelings, provided reassurance of support, consulted with facility staff-Kelley, LPN. 3. PATIENT/CAREGIVER EDUCATION/ COPING:  Patient is alert and oriented x3. He was tearful as he talked about the acute diagnosis and prognosis he is facing. He has a strong faith and he is optimistic that treatment will be successful. He has supportive daughters, supportive and tight knit friend/social group along with his church family.  4. PERSONAL EMERGENCY PLAN:  Per facility protocol. 5. COMMUNITY RESOURCES COORDINATION/ HEALTH CARE NAVIGATION:  Patient has a tight-knit social group that is supportive and visit with patient. Patient is receiving physical and occupational therapy.  6. FINANCIAL/LEGAL CONCERNS/INTERVENTIONS:  No financial or legal concerns.      SOCIAL HX:  Social History   Tobacco Use  . Smoking status: Former Smoker    Packs/day: 0.50    Years: 10.00    Pack years: 5.00    Types: Cigarettes    Quit date: 08/03/1980    Years since quitting: 39.1  . Smokeless tobacco: Never Used  Substance Use Topics  . Alcohol use: Yes    Comment: wine occ    CODE STATUS: DNR ADVANCED DIRECTIVES: Yes MOST FORM COMPLETE:  No HOSPICE EDUCATION PROVIDED: No  PPS: Patient is alert and oriented x3. He is weak and has increased fatigue, but actively participates in therapy. He ambulates independently with his walker within his room.   Duration of visit and documentation: 60 minutes  [address] [place], [place]

## 2019-09-22 DIAGNOSIS — C7931 Secondary malignant neoplasm of brain: Secondary | ICD-10-CM | POA: Diagnosis not present

## 2019-09-22 DIAGNOSIS — Z85528 Personal history of other malignant neoplasm of kidney: Secondary | ICD-10-CM | POA: Diagnosis not present

## 2019-09-22 DIAGNOSIS — C7951 Secondary malignant neoplasm of bone: Secondary | ICD-10-CM | POA: Diagnosis not present

## 2019-09-22 DIAGNOSIS — C787 Secondary malignant neoplasm of liver and intrahepatic bile duct: Secondary | ICD-10-CM | POA: Diagnosis not present

## 2019-09-22 DIAGNOSIS — C3411 Malignant neoplasm of upper lobe, right bronchus or lung: Secondary | ICD-10-CM | POA: Diagnosis not present

## 2019-09-22 DIAGNOSIS — R918 Other nonspecific abnormal finding of lung field: Secondary | ICD-10-CM | POA: Diagnosis not present

## 2019-09-23 DIAGNOSIS — C787 Secondary malignant neoplasm of liver and intrahepatic bile duct: Secondary | ICD-10-CM | POA: Diagnosis not present

## 2019-09-23 DIAGNOSIS — C7931 Secondary malignant neoplasm of brain: Secondary | ICD-10-CM | POA: Diagnosis not present

## 2019-09-23 DIAGNOSIS — R918 Other nonspecific abnormal finding of lung field: Secondary | ICD-10-CM | POA: Diagnosis not present

## 2019-09-23 DIAGNOSIS — C3411 Malignant neoplasm of upper lobe, right bronchus or lung: Secondary | ICD-10-CM | POA: Diagnosis not present

## 2019-09-23 DIAGNOSIS — C7951 Secondary malignant neoplasm of bone: Secondary | ICD-10-CM | POA: Diagnosis not present

## 2019-09-23 DIAGNOSIS — Z85528 Personal history of other malignant neoplasm of kidney: Secondary | ICD-10-CM | POA: Diagnosis not present

## 2019-09-25 DIAGNOSIS — C7931 Secondary malignant neoplasm of brain: Secondary | ICD-10-CM | POA: Diagnosis not present

## 2019-09-25 DIAGNOSIS — C787 Secondary malignant neoplasm of liver and intrahepatic bile duct: Secondary | ICD-10-CM | POA: Diagnosis not present

## 2019-09-25 DIAGNOSIS — Z85528 Personal history of other malignant neoplasm of kidney: Secondary | ICD-10-CM | POA: Diagnosis not present

## 2019-09-25 DIAGNOSIS — C7951 Secondary malignant neoplasm of bone: Secondary | ICD-10-CM | POA: Diagnosis not present

## 2019-09-25 DIAGNOSIS — C3411 Malignant neoplasm of upper lobe, right bronchus or lung: Secondary | ICD-10-CM | POA: Diagnosis not present

## 2019-09-25 DIAGNOSIS — R918 Other nonspecific abnormal finding of lung field: Secondary | ICD-10-CM | POA: Diagnosis not present

## 2019-09-28 DIAGNOSIS — Z85528 Personal history of other malignant neoplasm of kidney: Secondary | ICD-10-CM | POA: Diagnosis not present

## 2019-09-28 DIAGNOSIS — C7931 Secondary malignant neoplasm of brain: Secondary | ICD-10-CM | POA: Diagnosis not present

## 2019-09-28 DIAGNOSIS — R918 Other nonspecific abnormal finding of lung field: Secondary | ICD-10-CM | POA: Diagnosis not present

## 2019-09-28 DIAGNOSIS — C7951 Secondary malignant neoplasm of bone: Secondary | ICD-10-CM | POA: Diagnosis not present

## 2019-09-28 DIAGNOSIS — C3411 Malignant neoplasm of upper lobe, right bronchus or lung: Secondary | ICD-10-CM | POA: Diagnosis not present

## 2019-09-28 DIAGNOSIS — C787 Secondary malignant neoplasm of liver and intrahepatic bile duct: Secondary | ICD-10-CM | POA: Diagnosis not present

## 2019-09-29 DIAGNOSIS — C3411 Malignant neoplasm of upper lobe, right bronchus or lung: Secondary | ICD-10-CM | POA: Diagnosis not present

## 2019-09-29 DIAGNOSIS — C787 Secondary malignant neoplasm of liver and intrahepatic bile duct: Secondary | ICD-10-CM | POA: Diagnosis not present

## 2019-09-29 DIAGNOSIS — C7951 Secondary malignant neoplasm of bone: Secondary | ICD-10-CM | POA: Diagnosis not present

## 2019-09-29 DIAGNOSIS — Z85528 Personal history of other malignant neoplasm of kidney: Secondary | ICD-10-CM | POA: Diagnosis not present

## 2019-09-29 DIAGNOSIS — C7931 Secondary malignant neoplasm of brain: Secondary | ICD-10-CM | POA: Diagnosis not present

## 2019-09-29 DIAGNOSIS — R918 Other nonspecific abnormal finding of lung field: Secondary | ICD-10-CM | POA: Diagnosis not present

## 2019-09-30 DIAGNOSIS — C7951 Secondary malignant neoplasm of bone: Secondary | ICD-10-CM | POA: Diagnosis not present

## 2019-09-30 DIAGNOSIS — C7931 Secondary malignant neoplasm of brain: Secondary | ICD-10-CM | POA: Diagnosis not present

## 2019-09-30 DIAGNOSIS — R918 Other nonspecific abnormal finding of lung field: Secondary | ICD-10-CM | POA: Diagnosis not present

## 2019-09-30 DIAGNOSIS — C787 Secondary malignant neoplasm of liver and intrahepatic bile duct: Secondary | ICD-10-CM | POA: Diagnosis not present

## 2019-09-30 DIAGNOSIS — Z85528 Personal history of other malignant neoplasm of kidney: Secondary | ICD-10-CM | POA: Diagnosis not present

## 2019-09-30 DIAGNOSIS — C3411 Malignant neoplasm of upper lobe, right bronchus or lung: Secondary | ICD-10-CM | POA: Diagnosis not present

## 2019-10-01 DIAGNOSIS — C787 Secondary malignant neoplasm of liver and intrahepatic bile duct: Secondary | ICD-10-CM | POA: Diagnosis not present

## 2019-10-01 DIAGNOSIS — R918 Other nonspecific abnormal finding of lung field: Secondary | ICD-10-CM | POA: Diagnosis not present

## 2019-10-01 DIAGNOSIS — C7951 Secondary malignant neoplasm of bone: Secondary | ICD-10-CM | POA: Diagnosis not present

## 2019-10-01 DIAGNOSIS — C7931 Secondary malignant neoplasm of brain: Secondary | ICD-10-CM | POA: Diagnosis not present

## 2019-10-01 DIAGNOSIS — C3411 Malignant neoplasm of upper lobe, right bronchus or lung: Secondary | ICD-10-CM | POA: Diagnosis not present

## 2019-10-01 DIAGNOSIS — Z85528 Personal history of other malignant neoplasm of kidney: Secondary | ICD-10-CM | POA: Diagnosis not present

## 2019-10-04 DEATH — deceased

## 2019-10-05 ENCOUNTER — Telehealth: Payer: Self-pay | Admitting: Pulmonary Disease

## 2019-10-05 NOTE — Telephone Encounter (Signed)
Dr. Vaughan Browner, your patient Ryan Coffey passed away on 10-07-2019. I will sent a sympathy card.

## 2019-10-28 ENCOUNTER — Ambulatory Visit: Payer: Medicare Other | Admitting: Rheumatology

## 2019-11-25 ENCOUNTER — Encounter: Payer: Self-pay | Admitting: Internal Medicine

## 2019-11-30 ENCOUNTER — Encounter: Payer: Self-pay | Admitting: Internal Medicine

## 2019-12-04 ENCOUNTER — Other Ambulatory Visit: Payer: Medicare Other

## 2019-12-10 ENCOUNTER — Ambulatory Visit: Payer: Medicare Other | Admitting: Internal Medicine

## 2020-01-26 IMAGING — DX PORTABLE CHEST - 1 VIEW
1 series · 1 of 1 positions shown · non-contrast
Comparison: September 12, 2018.  Chest x-ray 05/16/2018

CLINICAL DATA: Shortness of breath

EXAM:
PORTABLE CHEST 1 VIEW

[chest]
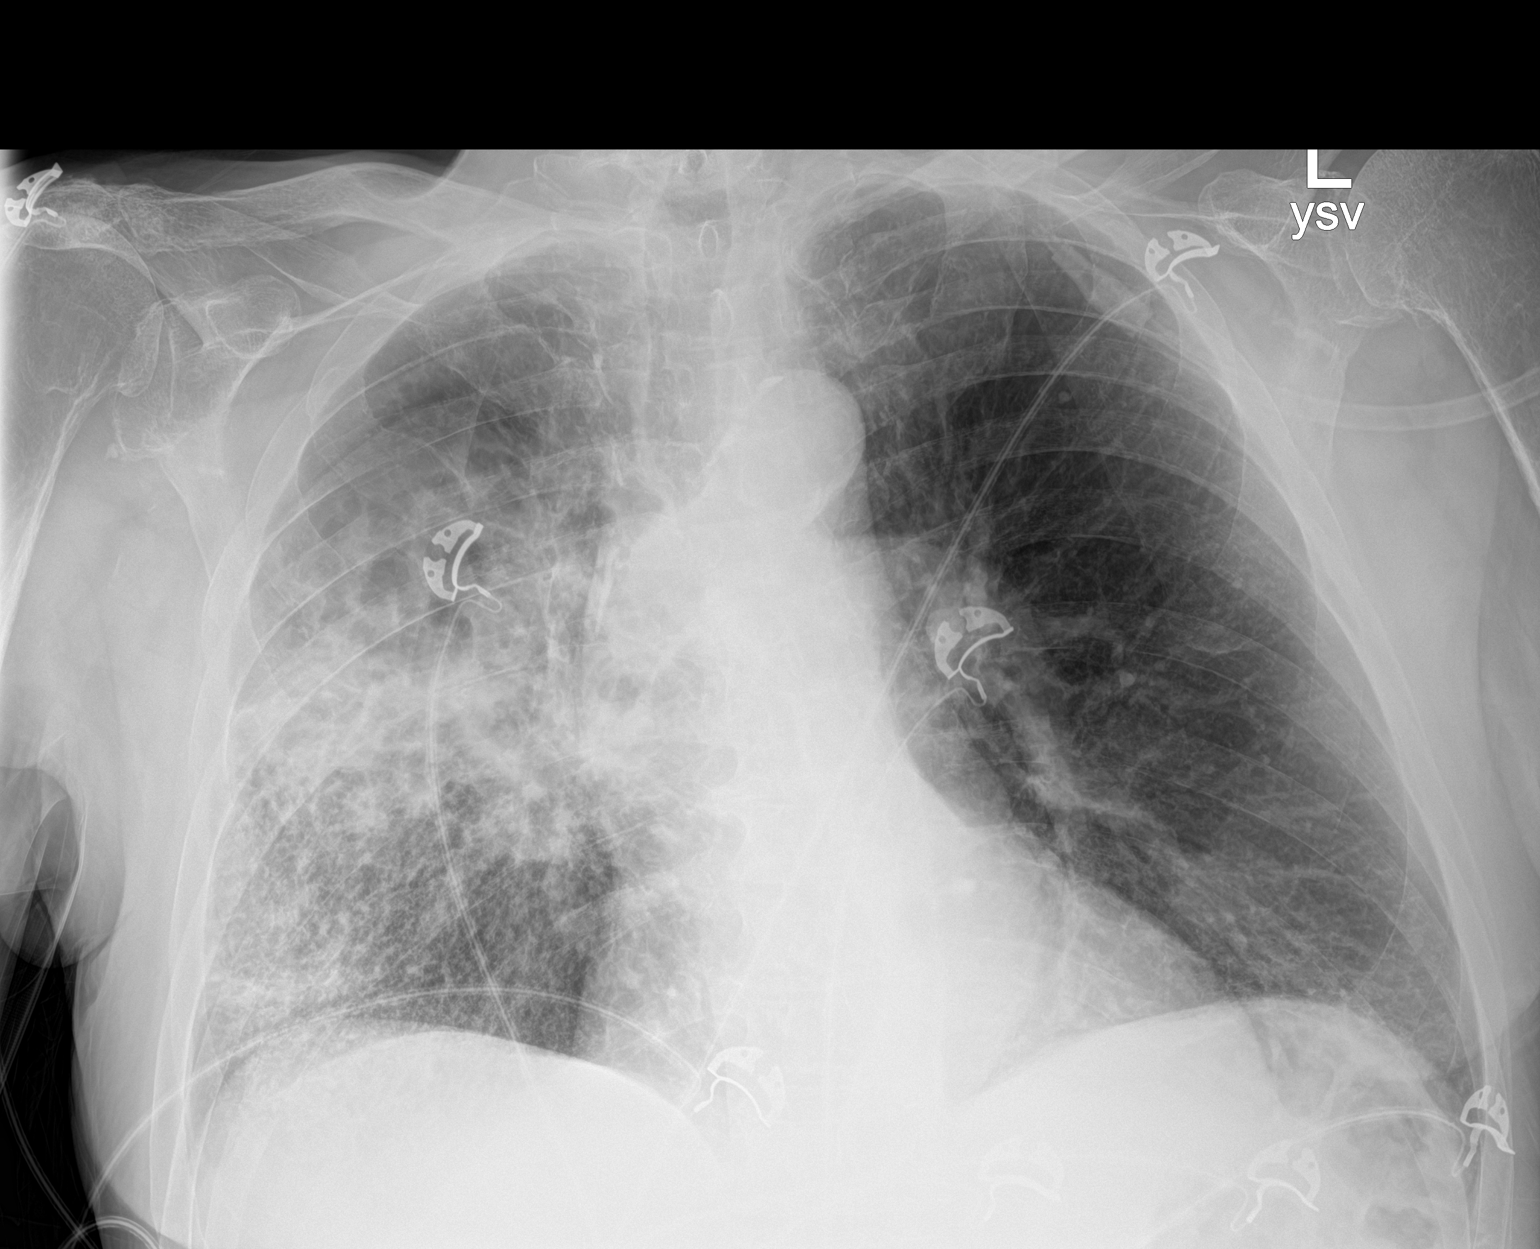

[1 of 1 positions shown; findings below may reference images not displayed]

FINDINGS: Consolidation in the right mid and lower lung compatible with
pneumonia. Left lung clear. Heart is normal size. No effusions or
acute bony abnormality.
IMPRESSION: Dense consolidation in the right mid and lower lung as seen on prior
CT compatible with pneumonia.

## 2020-02-01 IMAGING — DX CHEST - 2 VIEW
2 series · 2 of 2 positions shown · non-contrast
Comparison: 09/18/2018.

CLINICAL DATA: Shortness of breath.

EXAM:
CHEST - 2 VIEW

[chest lat]
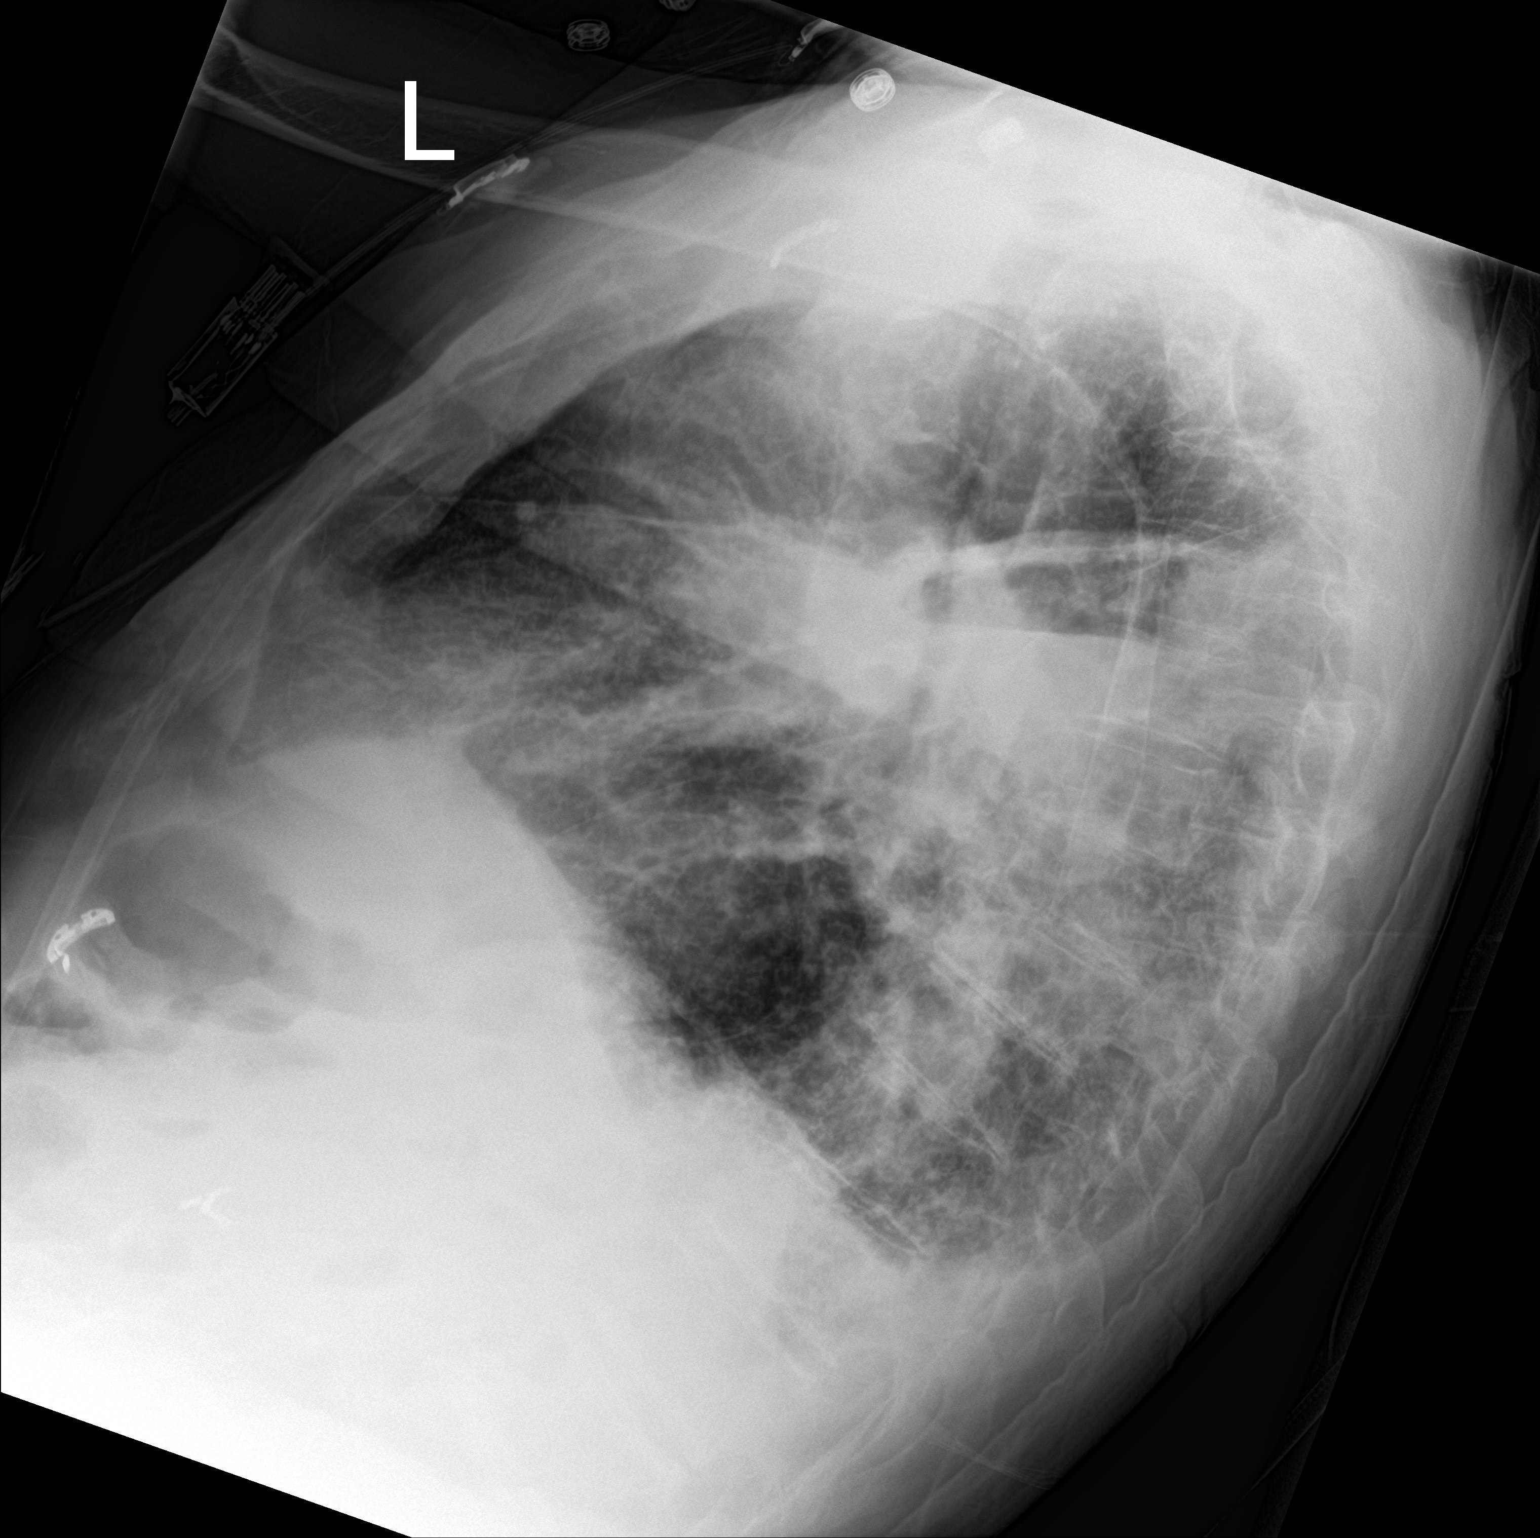

[chest ap]
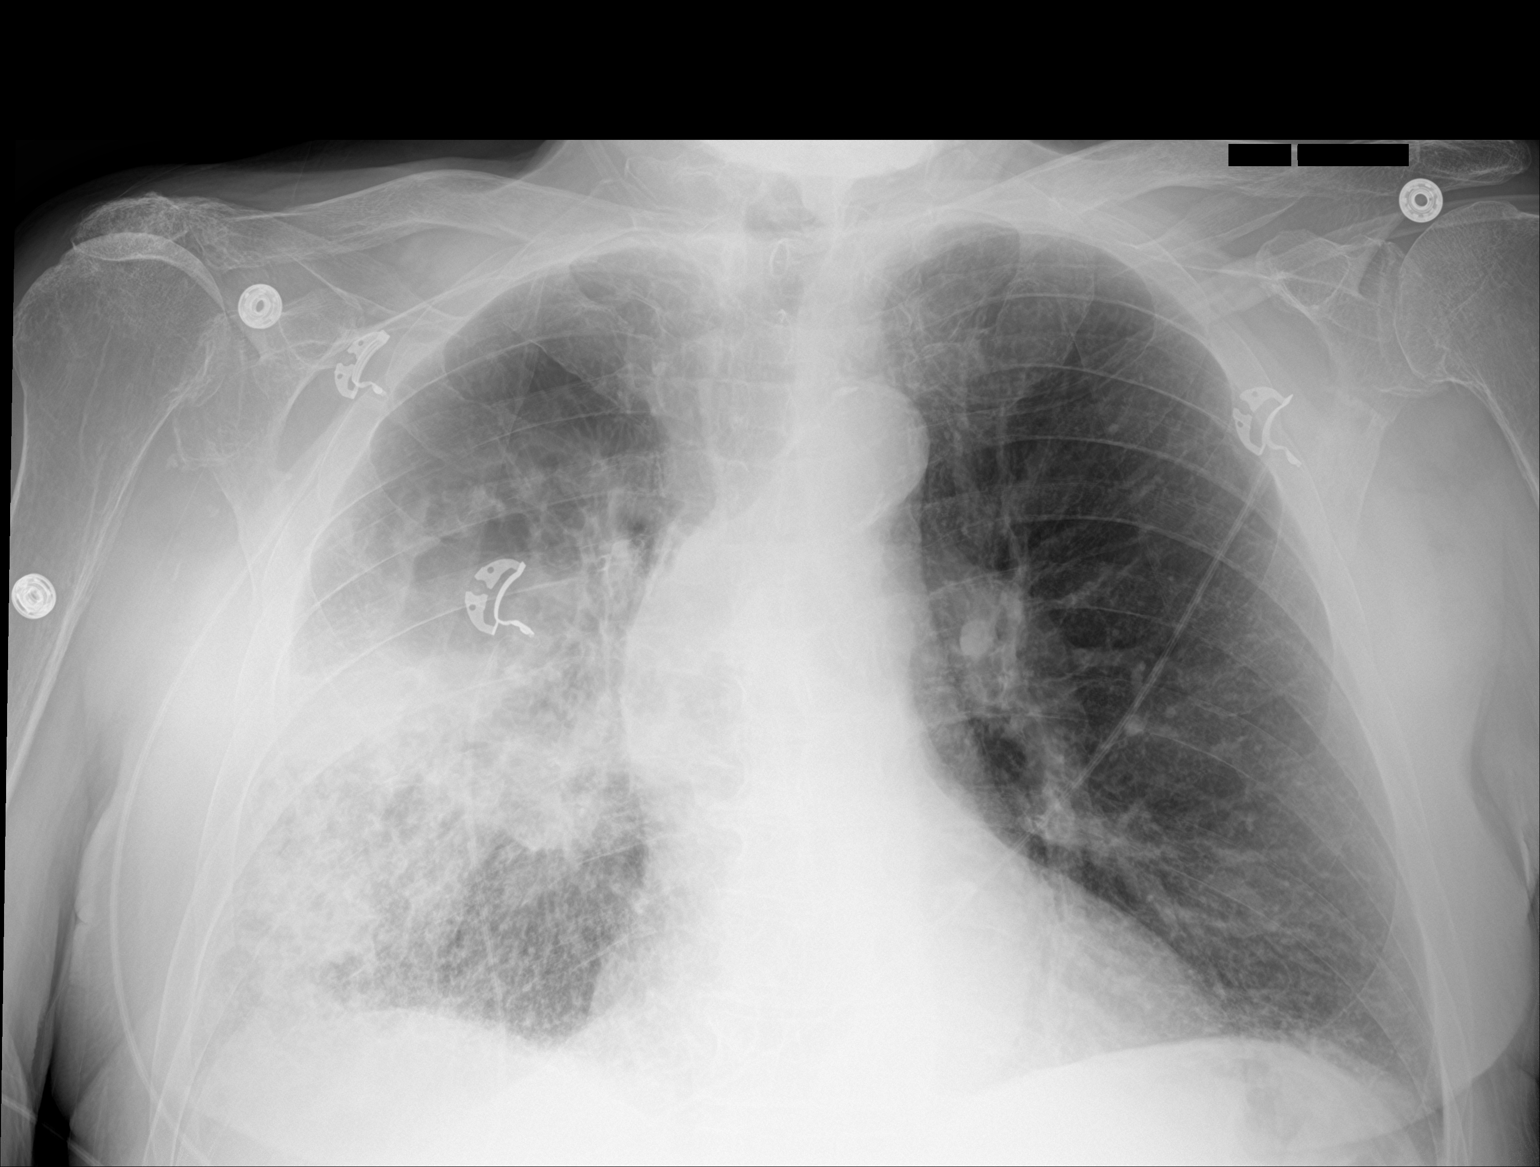

[2 of 2 positions shown; findings below may reference images not displayed]

FINDINGS: Prominent right mid/upper lung field and right base infiltrates
again noted. Slight progression from prior exam. Cavitation in the
right mid/upper infiltrate cannot be excluded. Low lung volumes.
COPD. Right pleural effusion noted on today's exam. Fluid in the
right major fissure most likely present. Heart size normal.
Degenerative changes scoliosis thoracic spine.
IMPRESSION: 1. Prominent right mid/upper lung field and right base infiltrates
again noted. Slight progression from prior exam. Cavitation within
the infiltrate in the right mid/upper lung cannot be excluded. Low
lung volumes. COPD.

2. Right pleural effusion noted on today's exam. Fluid in the right
major fissure most likely present.

## 2020-02-03 IMAGING — DX PORTABLE CHEST - 1 VIEW
1 series · 1 of 1 positions shown · non-contrast
Comparison: September 22, 2018

CLINICAL DATA: Respiratory failure.  COPD.

EXAM:
PORTABLE CHEST 1 VIEW

[chest ap]
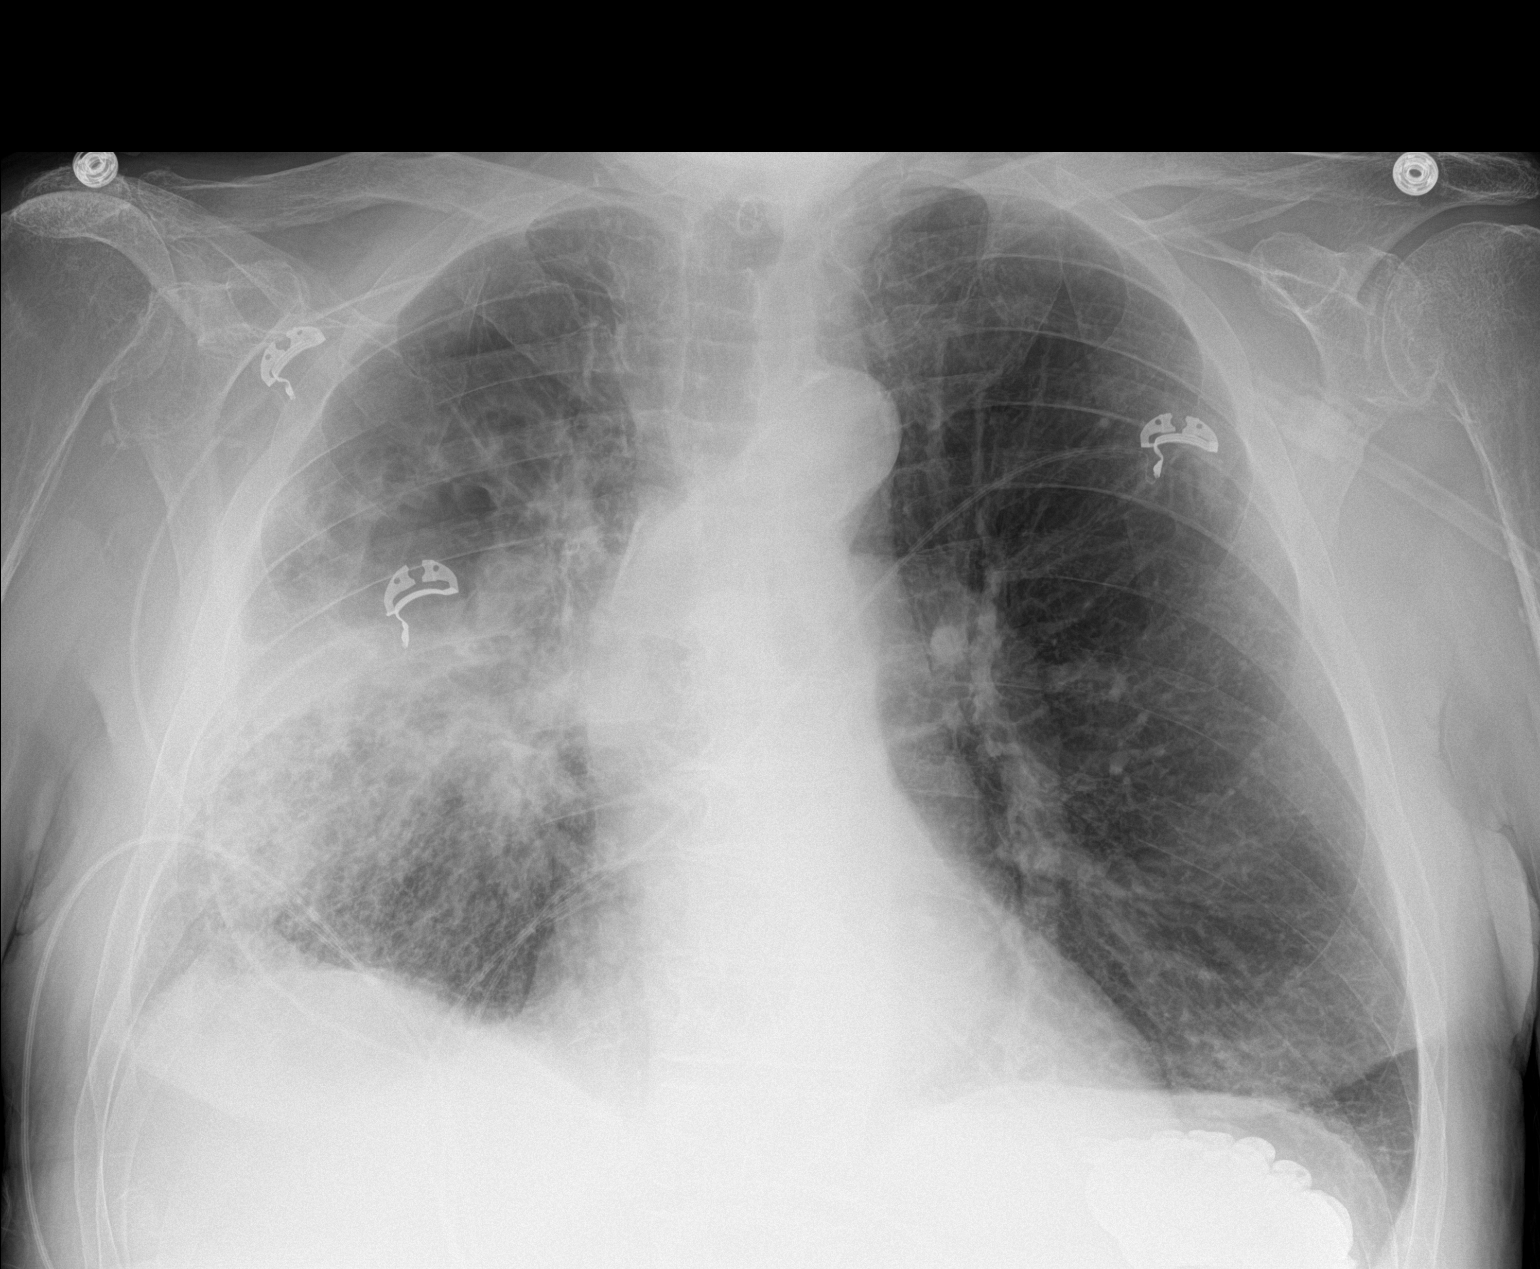

[1 of 1 positions shown; findings below may reference images not displayed]

FINDINGS: The cavitated infiltrate in the right mid and lateral lower lung is
stable. No pneumothorax. Minimal atelectasis in the left base. The
cardiomediastinal silhouette is stable. No other interval changes.
IMPRESSION: The cavitated infiltrate in the right mid and lateral lower lung is
stable. Minimal atelectasis in the left base. No other change.

## 2020-03-28 IMAGING — CT CT CHEST W/O CM
2 of 3 series · 14 of 36 positions shown, 17 images · non-contrast
Comparison: CT September 22, 2018

CLINICAL DATA: Pneumonia, originally diagnosed in Monday September, 2018

EXAM:
CT CHEST WITHOUT CONTRAST
TECHNIQUE: Multidetector CT imaging of the chest was performed following the
standard protocol without IV contrast.

[Series 2: thorax · axial · 0.78mm/px · z∈[-332,-68]mm · 11 of 156 slices shown, 14 images]
[im 12/156  mediastinal]
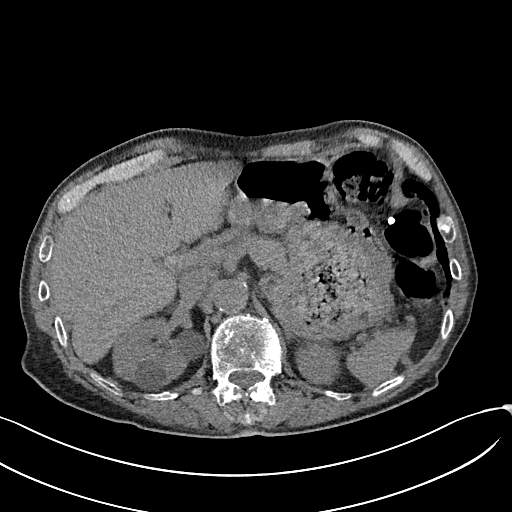
[im 12/156  lung]
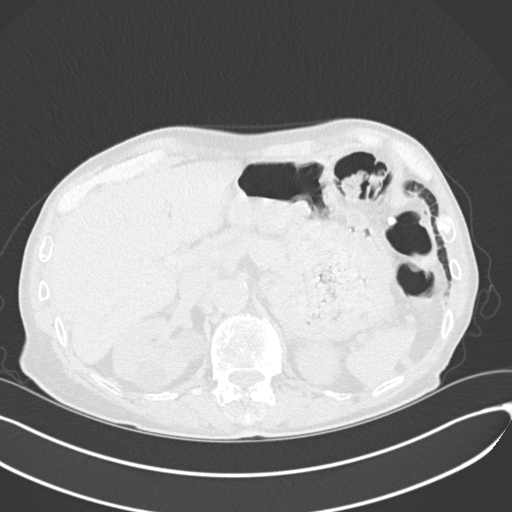
[im 23/156  lung]
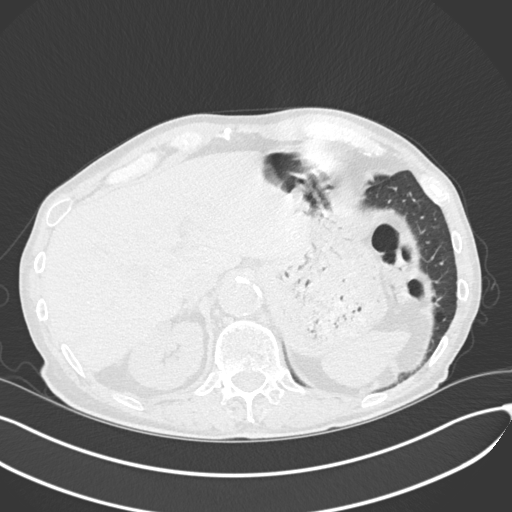
[im 35/156  lung]
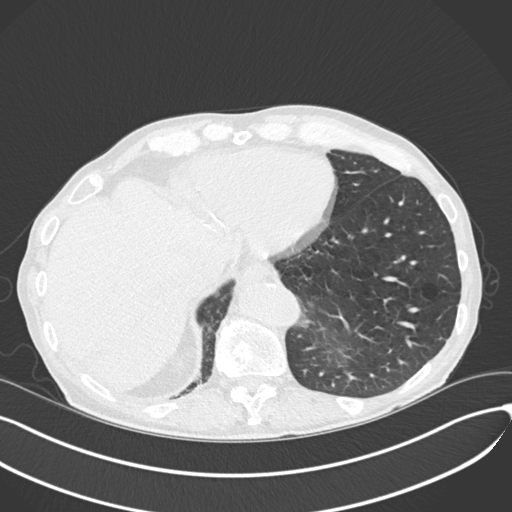
[im 52/156  lung]
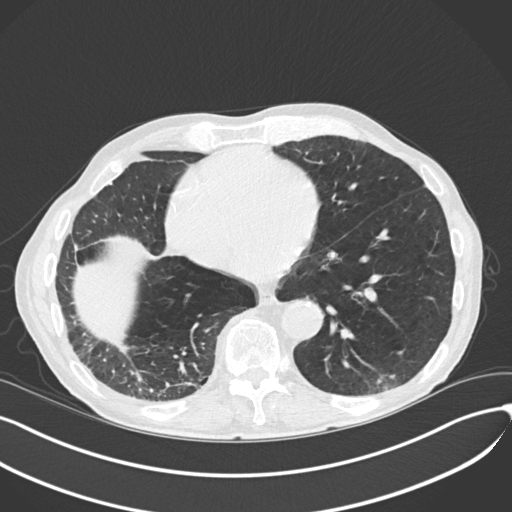
[im 64/156  mediastinal]
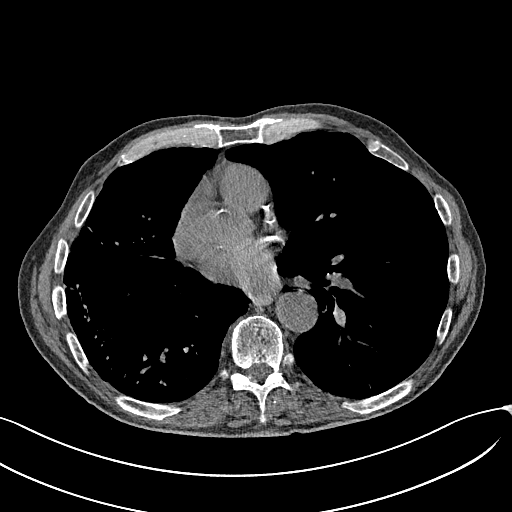
[im 64/156  lung]
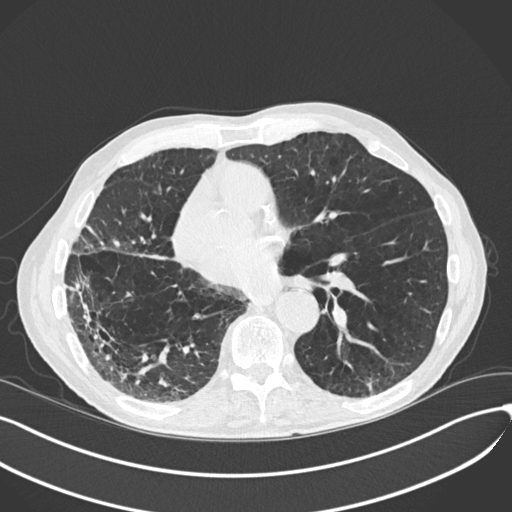
[im 81/156  lung]
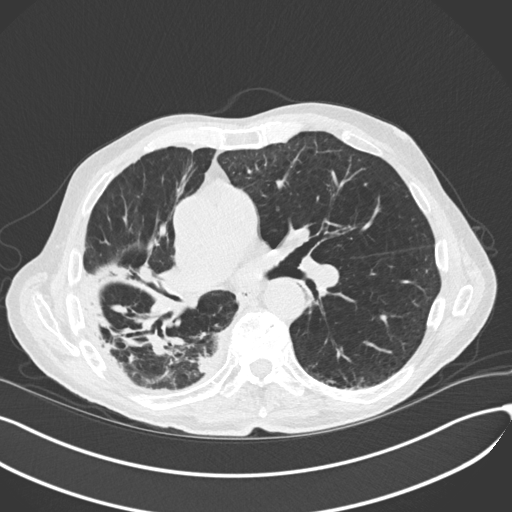
[im 92/156  lung]
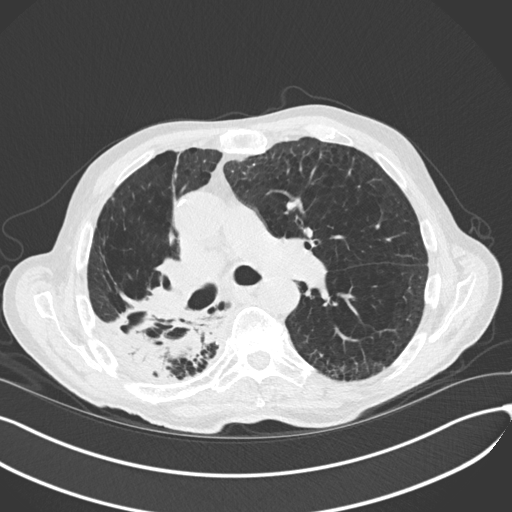
[im 104/156  lung]
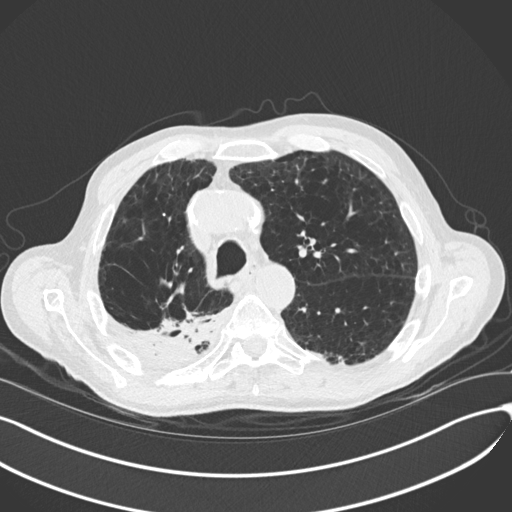
[im 121/156  mediastinal]
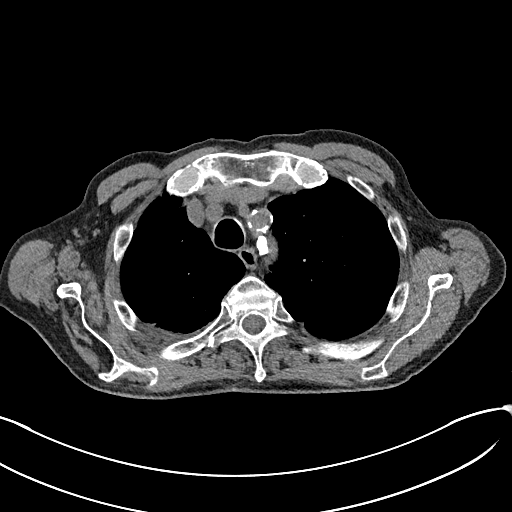
[im 121/156  lung]
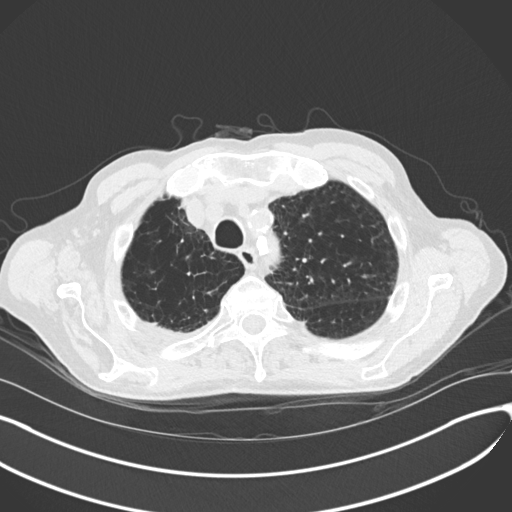
[im 133/156  lung]
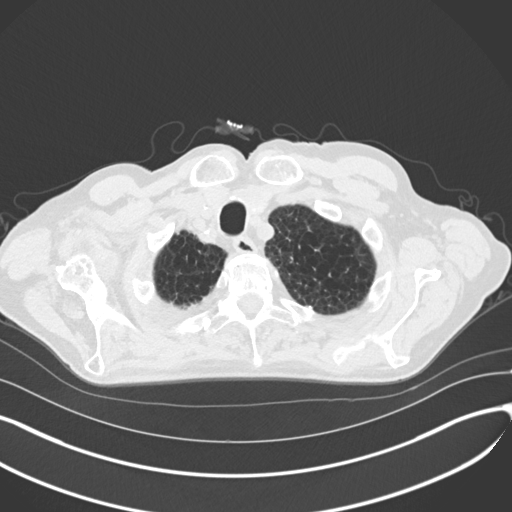
[im 144/156  lung]
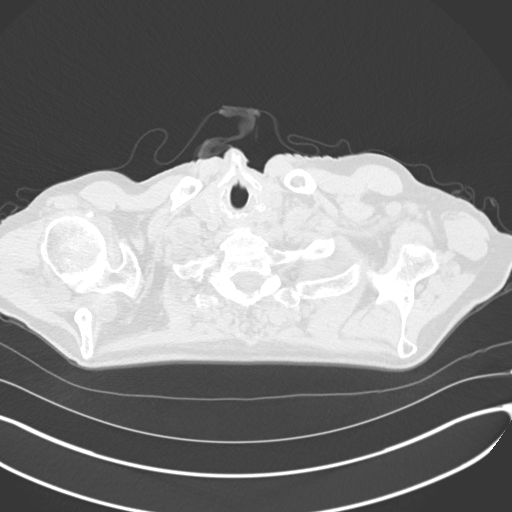

[Series 5: coronal · coronal · 0.61mm/px · 3 of 130 slices shown]
[im 26/130  lung]
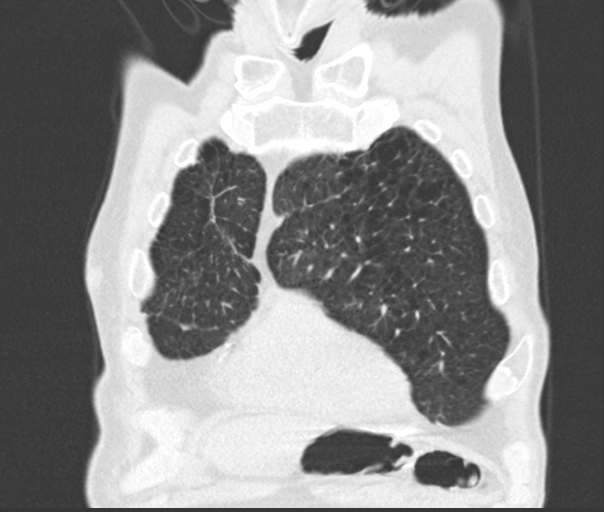
[im 52/130  lung]
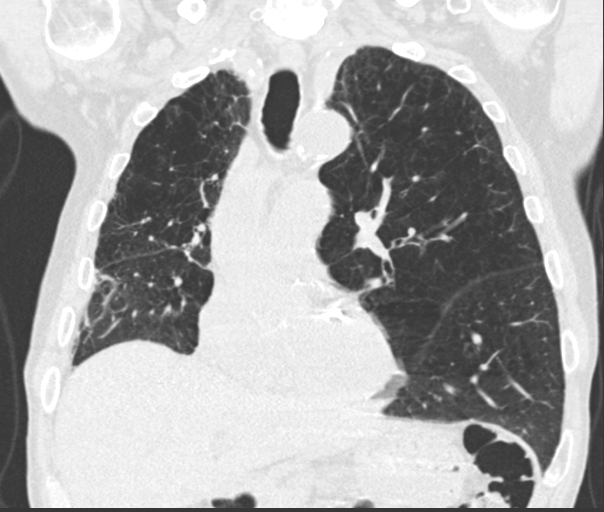
[im 78/130  lung]
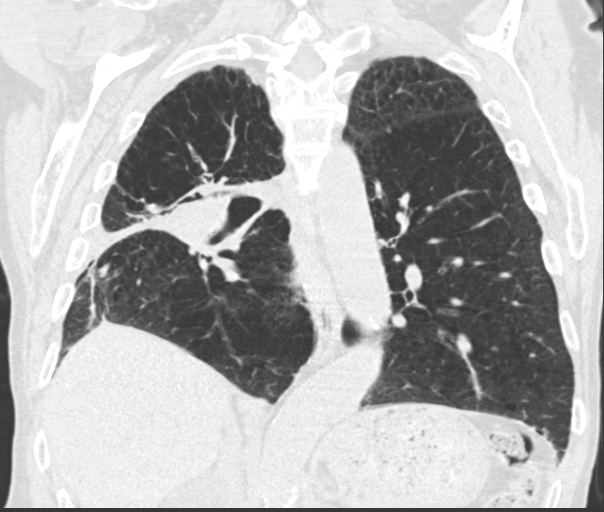

[14 of 36 positions shown; findings below may reference images not displayed]

FINDINGS: Cardiovascular: Atherosclerotic plaque within the normal caliber
aorta. Extensive calcification of the proximal great vessels noted
as well. Normal heart size. No pericardial effusion. Atherosclerotic
calcification of the coronary arteries. Dense calcification of the
mitral annulus and aortic leaflets is noted. Central pulmonary
arterial enlargement is present.

Mediastinum/Nodes: Scattered subcentimeter mediastinal nodes are
present, diminished in prominence from comparison CT. Multiple
thyroid nodules are present, largest in the left lobe measuring
cm. Scattered secretions are noted in the central airways. Esophagus
is unremarkable.

Lungs/Pleura: Background of severe centrilobular and paraseptal
emphysema. Subpleural reticular changes suggesting a degree of
interstitial fibrosis. Decrease in size of the cavitary lesion
centered in the superior segment of the right lower lobe with
surrounding areas of architectural distortion and varicoid
bronchiectasis. There is improving consolidative opacity throughout
the lungs but with extensive areas of residual coarse interstitial
opacity and resulting fibrotic change. Some residual consolidative
opacity remains in the superior segment. Increasing patchy
subpleural areas of ground-glass opacity and tree-in-bud nodularity
are present in the periphery of the left lower lobe and lingula. Few
scattered calcified nodules are nonspecific but may reflect sequela
of remote granulomatous disease.

Upper Abdomen: Multiple fluid attenuation cysts are present in the
upper abdomen. Stomach is distended with ingested material.

Musculoskeletal: Multilevel degenerative changes are present in the
imaged portions of the spine. No acute osseous abnormality or
suspicious osseous lesion. No chest wall soft tissue abnormality.
IMPRESSION: 1. Decrease in size of the cavitary lesion centered in the superior
segment of the right lower lobe with surrounding areas of
architectural distortion and varicoid bronchiectasis. Grossly
diminished though persistent consolidative opacity in the adjacent
superior segment. Scarring and architectural distortion is seen
throughout the previously affected areas.
2. Increasing patchy subpleural areas of ground-glass opacity and
tree-in-bud nodularity in the periphery of the left lower lobe and
lingula, concerning for a more acute infectious/inflammatory
process.
3. Severe centrilobular and paraseptal emphysema. Subpleural
reticular changes suggest a background of developing interstitial
fibrosis.
4. Central pulmonary arterial enlargement, compatible with pulmonary
arterial hypertension likely related to the underlying lung disease.
5. Multiple thyroid nodules, largest in the left lobe measuring
cm. Consider further evaluation with thyroid ultrasound. This
follows ACR consensus guidelines: Managing Incidental Thyroid
Nodules Detected on Imaging: White Paper of [REDACTED]. [HOSPITAL] 7529; [DATE].
6. Aortic Atherosclerosis ([id number].J)
7. Emphysema ([id number].6).
